# Patient Record
Sex: Male | Born: 1945
Health system: Southern US, Community
[De-identification: ages and names within clinical notes are randomized; demographics above are authoritative.]

## PROBLEM LIST (undated history)

## (undated) DIAGNOSIS — R918 Other nonspecific abnormal finding of lung field: Secondary | ICD-10-CM

## (undated) DIAGNOSIS — J309 Allergic rhinitis, unspecified: Secondary | ICD-10-CM

## (undated) DIAGNOSIS — G4700 Insomnia, unspecified: Secondary | ICD-10-CM

## (undated) DIAGNOSIS — I10 Essential (primary) hypertension: Secondary | ICD-10-CM

## (undated) DIAGNOSIS — Z9889 Other specified postprocedural states: Secondary | ICD-10-CM

## (undated) DIAGNOSIS — H409 Unspecified glaucoma: Secondary | ICD-10-CM

## (undated) DIAGNOSIS — M1990 Unspecified osteoarthritis, unspecified site: Secondary | ICD-10-CM

## (undated) DIAGNOSIS — K759 Inflammatory liver disease, unspecified: Secondary | ICD-10-CM

## (undated) DIAGNOSIS — E785 Hyperlipidemia, unspecified: Secondary | ICD-10-CM

## (undated) DIAGNOSIS — M5136 Other intervertebral disc degeneration, lumbar region: Secondary | ICD-10-CM

## (undated) DIAGNOSIS — R7611 Nonspecific reaction to tuberculin skin test without active tuberculosis: Secondary | ICD-10-CM

## (undated) DIAGNOSIS — N400 Enlarged prostate without lower urinary tract symptoms: Secondary | ICD-10-CM

## (undated) DIAGNOSIS — M51369 Other intervertebral disc degeneration, lumbar region without mention of lumbar back pain or lower extremity pain: Secondary | ICD-10-CM

## (undated) HISTORY — PX: LITHOTRIPSY: SUR834

## (undated) HISTORY — PX: OTHER SURGICAL HISTORY: SHX169

## (undated) HISTORY — DX: Benign prostatic hyperplasia without lower urinary tract symptoms: N40.0

## (undated) HISTORY — DX: Hyperlipidemia, unspecified: E78.5

## (undated) HISTORY — DX: Unspecified glaucoma: H40.9

## (undated) HISTORY — PX: EYE SURGERY: SHX253

## (undated) HISTORY — PX: TOTAL HIP ARTHROPLASTY: SHX124

## (undated) HISTORY — PX: TONSILLECTOMY: SUR1361

---

## 1999-07-02 ENCOUNTER — Ambulatory Visit (HOSPITAL_COMMUNITY): Admission: RE | Admit: 1999-07-02 | Discharge: 1999-07-02 | Payer: Self-pay | Admitting: Gastroenterology

## 1999-07-02 ENCOUNTER — Encounter (INDEPENDENT_AMBULATORY_CARE_PROVIDER_SITE_OTHER): Payer: Self-pay

## 2004-05-13 ENCOUNTER — Ambulatory Visit: Payer: Self-pay | Admitting: Internal Medicine

## 2004-07-12 ENCOUNTER — Ambulatory Visit (HOSPITAL_COMMUNITY): Admission: RE | Admit: 2004-07-12 | Discharge: 2004-07-12 | Payer: Self-pay | Admitting: Internal Medicine

## 2004-08-16 ENCOUNTER — Ambulatory Visit (HOSPITAL_COMMUNITY): Admission: RE | Admit: 2004-08-16 | Discharge: 2004-08-16 | Payer: Self-pay | Admitting: Internal Medicine

## 2004-08-18 ENCOUNTER — Ambulatory Visit (HOSPITAL_COMMUNITY): Admission: RE | Admit: 2004-08-18 | Discharge: 2004-08-18 | Payer: Self-pay | Admitting: Internal Medicine

## 2004-08-21 ENCOUNTER — Ambulatory Visit (HOSPITAL_COMMUNITY): Admission: RE | Admit: 2004-08-21 | Discharge: 2004-08-21 | Payer: Self-pay | Admitting: Urology

## 2005-03-24 ENCOUNTER — Ambulatory Visit: Payer: Self-pay | Admitting: Internal Medicine

## 2006-07-19 ENCOUNTER — Ambulatory Visit: Payer: Self-pay | Admitting: Internal Medicine

## 2006-07-19 ENCOUNTER — Encounter: Payer: Self-pay | Admitting: Internal Medicine

## 2006-07-19 LAB — CONVERTED CEMR LAB
ALT: 21 units/L (ref 0–40)
AST: 23 units/L (ref 0–37)
Albumin: 4.3 g/dL (ref 3.5–5.2)
Alkaline Phosphatase: 60 units/L (ref 39–117)
BUN: 23 mg/dL (ref 6–23)
CO2: 32 meq/L (ref 19–32)
Calcium: 10.1 mg/dL (ref 8.4–10.5)
Chloride: 105 meq/L (ref 96–112)
Chol/HDL Ratio, serum: 3.3
Cholesterol: 170 mg/dL (ref 0–200)
Creatinine, Ser: 1.1 mg/dL (ref 0.4–1.5)
GFR calc non Af Amer: 73 mL/min
Glomerular Filtration Rate, Af Am: 88 mL/min/{1.73_m2}
Glucose, Bld: 103 mg/dL — ABNORMAL HIGH (ref 70–99)
HCV Quantitative: <5 intl units/mL (ref <5)
HDL: 50.9 mg/dL (ref >39.0)
Iron: 165 ug/dL (ref 42–165)
LDL Cholesterol: 101 mg/dL — ABNORMAL HIGH (ref 0–99)
Potassium: 4.6 meq/L (ref 3.5–5.1)
Sodium: 142 meq/L (ref 135–145)
Total Bilirubin: 1.7 mg/dL — ABNORMAL HIGH (ref 0.3–1.2)
Total Protein: 7.1 g/dL (ref 6.0–8.3)
Triglyceride fasting, serum: 90 mg/dL (ref 0–149)
VLDL: 18 mg/dL (ref 0–40)

## 2007-02-12 DIAGNOSIS — Z860100 Personal history of colon polyps, unspecified: Secondary | ICD-10-CM | POA: Insufficient documentation

## 2007-02-12 DIAGNOSIS — B1710 Acute hepatitis C without hepatic coma: Secondary | ICD-10-CM | POA: Insufficient documentation

## 2007-02-12 DIAGNOSIS — J309 Allergic rhinitis, unspecified: Secondary | ICD-10-CM | POA: Insufficient documentation

## 2007-04-25 ENCOUNTER — Encounter: Admission: RE | Admit: 2007-04-25 | Discharge: 2007-04-25 | Payer: Self-pay | Admitting: Internal Medicine

## 2007-04-26 ENCOUNTER — Ambulatory Visit (HOSPITAL_BASED_OUTPATIENT_CLINIC_OR_DEPARTMENT_OTHER): Admission: RE | Admit: 2007-04-26 | Discharge: 2007-04-26 | Payer: Self-pay | Admitting: Urology

## 2007-05-05 ENCOUNTER — Ambulatory Visit: Payer: Self-pay | Admitting: Internal Medicine

## 2007-05-05 DIAGNOSIS — Z87442 Personal history of urinary calculi: Secondary | ICD-10-CM | POA: Insufficient documentation

## 2007-05-18 ENCOUNTER — Ambulatory Visit (HOSPITAL_COMMUNITY): Admission: RE | Admit: 2007-05-18 | Discharge: 2007-05-18 | Payer: Self-pay | Admitting: Urology

## 2009-05-27 ENCOUNTER — Telehealth: Payer: Self-pay | Admitting: Internal Medicine

## 2009-06-13 ENCOUNTER — Ambulatory Visit: Payer: Self-pay | Admitting: Internal Medicine

## 2009-06-13 ENCOUNTER — Telehealth: Payer: Self-pay | Admitting: Internal Medicine

## 2009-08-20 ENCOUNTER — Encounter: Admission: RE | Admit: 2009-08-20 | Discharge: 2009-08-20 | Payer: Self-pay | Admitting: Urology

## 2009-09-04 ENCOUNTER — Ambulatory Visit: Payer: Self-pay | Admitting: Internal Medicine

## 2009-09-04 ENCOUNTER — Encounter: Payer: Self-pay | Admitting: Internal Medicine

## 2009-09-23 ENCOUNTER — Encounter: Admission: RE | Admit: 2009-09-23 | Discharge: 2009-09-23 | Payer: Self-pay | Admitting: Urology

## 2009-09-23 ENCOUNTER — Encounter: Admission: RE | Admit: 2009-09-23 | Discharge: 2009-09-23 | Payer: Self-pay | Admitting: Internal Medicine

## 2009-10-03 ENCOUNTER — Encounter: Payer: Self-pay | Admitting: Internal Medicine

## 2009-10-22 ENCOUNTER — Encounter: Payer: Self-pay | Admitting: Internal Medicine

## 2009-10-25 ENCOUNTER — Ambulatory Visit (HOSPITAL_BASED_OUTPATIENT_CLINIC_OR_DEPARTMENT_OTHER): Admission: RE | Admit: 2009-10-25 | Discharge: 2009-10-25 | Payer: Self-pay | Admitting: Urology

## 2009-12-26 ENCOUNTER — Encounter: Payer: Self-pay | Admitting: Internal Medicine

## 2010-01-22 ENCOUNTER — Ambulatory Visit: Payer: Self-pay | Admitting: Family Medicine

## 2010-01-22 DIAGNOSIS — M545 Low back pain: Secondary | ICD-10-CM

## 2010-01-22 DIAGNOSIS — M25559 Pain in unspecified hip: Secondary | ICD-10-CM | POA: Insufficient documentation

## 2010-05-30 ENCOUNTER — Telehealth: Payer: Self-pay | Admitting: Internal Medicine

## 2010-08-14 NOTE — Progress Notes (Signed)
Summary: REQ FOR RX's  Phone Note Call from Patient   Summary of Call: Pt c/o tracheal irritation & cough. He is req rx's for prednisone, tess perles & prometh/cod. Ok per Dr Posey Rea. Initial call taken by: Lamar Sprinkles, CMA,  May 30, 2010 9:26 AM  Follow-up for Phone Call        ok Follow-up by: Tresa Garter MD,  May 30, 2010 1:19 PM    New/Updated Medications: PREDNISONE 10 MG TABS (PREDNISONE) 2 tabs once daily x 2 days then 1 once daily x 6 days TESSALON PERLES 100 MG CAPS (BENZONATATE) 1 every 6 hours as needed cough PROMETHAZINE-CODEINE 6.25-10 MG/5ML SYRP (PROMETHAZINE-CODEINE) 1 tsp every 6 hours as needed cough Prescriptions: PROMETHAZINE-CODEINE 6.25-10 MG/5ML SYRP (PROMETHAZINE-CODEINE) 1 tsp every 6 hours as needed cough  #8 x 0   Entered by:   Lamar Sprinkles, CMA   Authorized by:   Tresa Garter MD   Signed by:   Lamar Sprinkles, CMA on 05/30/2010   Method used:   Telephoned to ...       [facility]* (retail)       [address]       [address] Shipping/mailing       [place], [place]  [address]       Ph: [phone]       Fax: [phone]   RxID:   [id number] TESSALON PERLES 100 MG CAPS (BENZONATATE) 1 every 6 hours as needed cough  #30 x 0   Entered by:   Lamar Sprinkles, CMA   Authorized by:   Tresa Garter MD   Signed by:   Lamar Sprinkles, CMA on 05/30/2010   Method used:   Electronically to        [facility]* (retail)       [address]       [address] Shipping/mailing       [place], [place]  [address]       Ph: [phone]       Fax: [phone]   RxID:   [id number] PREDNISONE 10 MG TABS (PREDNISONE) 2 tabs once daily x 2 days then 1 once daily x 6 days  #10 x 0   Entered by:   Lamar Sprinkles, CMA   Authorized by:   Tresa Garter MD   Signed by:   Lamar Sprinkles, CMA on 05/30/2010   Method used:   Electronically to        [facility]* (retail)      [address]       [address] Shipping/mailing       [place], [place]  [address]       Ph: [phone]       Fax: [phone]   RxID:   [id number]

## 2010-08-14 NOTE — Assessment & Plan Note (Signed)
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  is been an ongoing problem with them for some years, however this point, is having worsening right-sided pain, a stabbing pain he describes as groin pain, and also is having some lateral and posterior buttocks pain. He is not having any radiculopathy. He is not having any numbness or tingling. No bowel or bladder incontinence. No anesthesia.  Is actually quite active, it is doing a lumbar spine rehabilitation protocol the he reviewed and show me here in the office today. Actually very good protocol, and he does this religiously every morning.  x-rays, right hip, in detail and reviewed, there is a moderate degree of joint space loss, and notable osteoarthritic changes.  X-ray Musculoskeletal  Procedure date:  01/28/2010  Findings:      DG LUMBAR SPINE COMPLETE - [id number]   Clinical Data: History of back pain.  No history of trauma.   LUMBAR SPINE - COMPLETE 4+ VIEW   Comparison: 10/25/2009.  CT 08/20/2009.   Findings: There is moderate fecal distention of portions of the colon.  SI joints appear intact.  There is narrowing of intervertebral disc space at the L5-S1 level.  There is marginal osteophyte formation representing degenerative spondylosis.  No fracture or bony destruction is seen.  No pars defects were evident.   IMPRESSION: No fracture or subluxation is evident.  Changes of degenerative disc disease and degenerative spondylosis are described above.   Read By:  Crawford Givens,  M.D.      Released By:  Crawford Givens,  M.D.  Allergies (verified): No Known Drug Allergies  Past History:  Past medical, surgical, family and social histories (including risk factors) reviewed, and no changes noted (except as noted below).  Past Medical History: Reviewed history from 05/05/2007 and no changes required. UCD Allergic rhinitis Colonic polyps, hx of Hepatitis C Nephrolithiasis, hx of: 2/06, 10/08  Past Surgical History: Reviewed history from 05/05/2007 and no changes required. Tonsillectomy Vasectomy lasik surgery [facility] '06 Ureteroscopy with homian laser lithotripsy  Family History: Reviewed history and no changes required.  Social History: Reviewed history and no changes required.  Review of Systems       REVIEW OF SYSTEMS  GEN: No systemic complaints, no fevers, chills, sweats, or other acute illnesses MSK: Detailed in the HPI GI: tolerating PO intake without difficulty Neuro: No numbness, parasthesias, or tingling associated. Otherwise, the pertinent positives and negatives are listed above and in the HPI, otherwise a full review of systems has been reviewed and is negative unless noted positive.   Physical Exam  General:  Well-developed,well-nourished,in no acute distress; alert,appropriate and cooperative throughout examination Head:  normocephalic and atraumatic.   Ears:  no external deformities.   Lungs:  normal respiratory effort.   Msk:  HIP EXAM: SIDE: R ROM: Abduction, Flexion, Internal and Ext ernal range of motion: patient is have some loss, proximally 25% of abduction, internal and external rotation Pain with terminal IROM and EROM: yes positive C sign GTB: NT SLR: NEG Knees: No effusion FABER:  NT REVERSE FABER: notably positive Piriformis: markedly tender at palpation Str: flexion: 5/5 abduction: 5/5 adduction: 5/5 Strength testing non-tender  Psych:  Cognition and judgment appear intact. Alert and cooperative with normal attention span  and concentration. No apparent delusions, illusions, hallucinations Additional Exam:  L4-S1 is intact in terms of motor strength and sensation. The tendon reflexes are 2+ Intact for pinprick and light touch.  Neurovascularly intact  anterior hip also has notable pain just inferior to the ASIS, there is pain with sartorius isolation and muscle testing.   Impression & Recommendations:  Problem # 1:  HIP PAIN, RIGHT (ICD-719.45) I suspected this is multifactorial. Certainly there is a component of intra-articular hip osteoarthritis. there is also focal muscular pain anteriorly.  Was also a great deal of focal performed spasm, which could explain the buttocks muscle pain. Tosh like to proceed conservatively, and I think this is very reasonable. He is going to f/u with Denice Paradise for PT, and focus on deep muscle massage, PT.   If he has continued symptoms, I think that he would be helpful from a diagnostic and therapeutic standpoint to do a intra-articular hip injection. These are not done blindly, and I generally refer these to interventional radiology.  cc: Dr. Johnella Moloney, MD  Problem # 2:  BACK PAIN, LUMBAR (ICD-724.2)  Orders: T-Lumbar Spine Complete, 5 Views (71110TC)  Complete Medication List: 1)  Zaleplon 10 Mg Caps (Zaleplon) .... At bedtime as needed 2)  Diovan 80 Mg Tabs (Valsartan) .... Take one tablet daily  Patient Instructions: 1)  1. Work on pretzel stretching, shoulder back and leg draped in front. 3-5 sets, 30 sec.. 2)  2. hip abductor rotations. standing, hip flexion and rotation outward then inward. 3 sets, 15 reps. when can do comfortably, add ankle weights starting at 2 pounds.  3)  3. [place] style stretch - knee to chest 4)  4."sink stretch" - cross leg and lean back THIS YOU CAN DO AT WORK 5)  5. Hip rotations while sitting, 3 x 15  Current Allergies (reviewed today): No known allergies

## 2010-08-14 NOTE — Miscellaneous (Signed)
Summary: Orders Update  Clinical Lists Changes  Orders: Added new Test order of T-1 View Abdomen (KUB) (74000TC) - Signed 

## 2010-08-14 NOTE — Medication Information (Signed)
Summary: [facility]  [facility]   Imported By: Lester [place] 10/25/2009 12:00:06  _____________________________________________________________________  External Attachment:    Type:   Image     Comment:   External Document

## 2010-08-14 NOTE — Procedures (Signed)
Summary: Sharrell Ku MD  Sharrell Ku MD   Imported By: Lester [place] 11/06/2009 10:16:32  _____________________________________________________________________  External Attachment:    Type:   Image     Comment:   External Document

## 2010-09-30 LAB — POCT I-STAT, CHEM 8
BUN: 32 mg/dL — ABNORMAL HIGH (ref 6–23)
Calcium, Ion: 1.33 mmol/L — ABNORMAL HIGH (ref 1.12–1.32)
Chloride: 106 mEq/L (ref 96–112)
Creatinine, Ser: 1.8 mg/dL — ABNORMAL HIGH (ref 0.4–1.5)
Glucose, Bld: 91 mg/dL (ref 70–99)
HCT: 41 % (ref 39.0–52.0)
Hemoglobin: 13.9 g/dL (ref 13.0–17.0)
Potassium: 4.3 mEq/L (ref 3.5–5.1)
Sodium: 137 mEq/L (ref 135–145)
TCO2: 26 mmol/L (ref 0–100)

## 2010-11-25 NOTE — Op Note (Signed)
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   PROCEDURES:  Cystoscopy, retrograde ureteroscopy and double-J stent  placement.   SURGEON:  Boston Service, M.D.   ASSISTANT:  None.   ANESTHESIA:  General.   FINDINGS:  Small, less than 1 mm, fragments in the region of the left  ureteral orifice.   SPECIMENS:  None.   COMPLICATIONS:  None obvious.   DESCRIPTION OF PROCEDURE:  The patient was prepped and draped in the  dorsal lithotomy position after institution of an adequate level of  general anesthesia.  Urethra showed no obstruction.  Prostatic urethra  was also nonobstructive.  Bullous edema around the left ureteral  orifice.  Normal urothelium around the right orifice.   Blocking catheter was selected, positioned at the right and then the  left ureteral orifice.  On the right side, there was no evidence of  filling defect or obstruction.  Prompt drainage at 3-5 minutes.  On the  left side, there appeared to be small filling defects within the left  distal ureter.  There were also calcific densities adjacent to the  lateral wall of the bladder, apparently fluid bolus in the left  hemipelvis.  After injection of contrast a floppy-tip guidewire was  inserted, passed into what appeared to be a bifid renal pelvis on the  left.  Ureteroscope was inserted.  Small, less than 1 mm, fragments  were  identified within the left distal ureter and washed out using the  ureteroscope.  No large fragments were identified.  Scope was passed to  the limit of the short 6-French scope and then gently withdrawn.  A 6-  French  24 cm double-J stent with a string was then passed over the indwelling  guidewire with excellent pigtail formation on guidewire removal.  The  patient was given a B&O suppository, 30 mg of Toradol and was returned  to recovery in satisfactory condition.           ______________________________  Boston Service, M.D.     RH/MEDQ  D:  05/18/2007  T:  05/18/2007  Job:  [id number]   cc:   Candyce Churn, M.D.  Fax: [phone]

## 2010-11-25 NOTE — Op Note (Signed)
NAMECHARLETON, Brandon Watts              ACCOUNT NO.:  [id number]   MEDICAL RECORD NO.:  [id number]          PATIENT TYPE:  AMB   LOCATION:  [facility]                         FACILITY:  [facility]   PHYSICIAN:  Boston Service, M.D.DATE OF BIRTH:  [birth year]   DATE OF PROCEDURE:  04/26/2007  DATE OF DISCHARGE:                               OPERATIVE REPORT   PREOPERATIVE DIAGNOSIS:  6 mm calculus left distal ureter.   POSTOPERATIVE DIAGNOSIS:  6 mm calculus left distal ureter.   PROCEDURE:  Cystoscopy, retrograde ureteroscopy and holmium laser  fragmentation of stone.   SURGEON:  Boston Service, M.D.   ASSISTANT:  None.   ANESTHESIA:  General.   FINDINGS:  Impacted 6 mm calculus.   SPECIMENS:  Multitude of stone fragments.   DISPOSITION OF SPECIMENS:  To the patient.   ESTIMATED BLOOD LOSS:  Minimal.   COMPLICATIONS:  None obvious.   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  The patient did have  history of stone about two or three years ago and symptoms over the last  week or so have been more consistent with stone.  Prompted a CT scan  10/13/ 2008 which showed 6 mm calculus left distal ureter.  Discussed  with the patient options for therapy, agreed on ureteroscopy and holmium  laser fragmentation after discussing other reasonable choices for  therapy including the risks and benefits and the alternatives.   DESCRIPTION:  The patient was prepped and draped in the dorsal lithotomy  position after institution of adequate level of general anesthesia.  Well lubricated 21-French panendoscope was gently inserted at the  urethral meatus, normal urethra and sphincter, mild elevation of the  median lobe of the prostate.  Trigonal anatomy was relatively normal.  Clear reflux right orifice, minimal reflux left orifice.  Bladder was  inspected with  a 12 and 70 degrees lenses and showed no other obvious  evidence of intravesical pathology.   Blocking catheter was selected at the right ureteral orifice then at the  left ureteral orifice on the right side.  Normal course and caliber of  the ureter, pelvis and calyces with prompt drainage at about three to  five minutes.  On the left side the patient had a thin tight distal  ureter with a 6 mm filling defect proximally and marked hydronephrosis.  A glide wire was selected, passed at the left ureteral orifice,  negotiated beyond the filling defect and passed into what appeared to be  dilated upper pole calyces, the 6-French end-hole catheter was then  passed over the glide wire with prompt postobstructive diuresis through  the stent.  The glide wire was removed and replaced with a guidewire end-  hole catheter was withdrawn and the short 6-French ureteroscope was  inserted alongside the guidewire.  Stone was localized within the distal  ureter, 365 holmium fiber 0.5 joules was selected and fragmentation  commenced.  Stone appeared to fragment easily and after about 10 or 15  minutes all  fragments were 1 mm or less.  Ureteroscope was then  withdrawn.  There was prompt reflux of small stony fragments and  concentrated urine from the left ureteral orifice.  No stent was left  indwelling.  Bladder was drained.  Cystoscope was removed.  The patient  was given a B&O suppository and returned to recovery in satisfactory  condition.           ______________________________  Boston Service, M.D.     RH/MEDQ  D:  04/26/2007  T:  04/27/2007  Job:  [id number]   cc:   Candyce Churn, M.D.  Fax: [phone]   Rosalyn Gess. Leng, MD  [address]  [place]  [place] [address]

## 2011-04-14 ENCOUNTER — Telehealth: Payer: Self-pay | Admitting: *Deleted

## 2011-04-14 MED ORDER — CEPHALEXIN 500 MG PO CAPS: 500.0000 mg | ORAL_CAPSULE | Freq: Four times a day (QID) | ORAL | Status: AC

## 2011-04-14 NOTE — Telephone Encounter (Signed)
Needs ABX for infection in finger, Ok per Dr Posey Rea. Rx sent in, Patient informed.   Dr Macario Golds, please sign off on phone message, thank you

## 2011-04-14 NOTE — Telephone Encounter (Signed)
L thumb paronychia I&D: 0.5 cc pus. Wound dresssed. Keflex, soaks. Thx

## 2011-04-23 LAB — POCT HEMOGLOBIN-HEMACUE
Hemoglobin: 15
Operator id: 268271

## 2011-09-19 ENCOUNTER — Other Ambulatory Visit: Payer: Self-pay | Admitting: Family Medicine

## 2011-09-19 MED ORDER — FLUTICASONE PROPIONATE 50 MCG/ACT NA SUSP: 2.0000 | Freq: Every day | NASAL | Status: DC

## 2012-01-19 ENCOUNTER — Other Ambulatory Visit: Payer: Self-pay | Admitting: Internal Medicine

## 2012-01-19 ENCOUNTER — Other Ambulatory Visit (HOSPITAL_COMMUNITY): Payer: Self-pay | Admitting: Internal Medicine

## 2012-01-19 DIAGNOSIS — Z87891 Personal history of nicotine dependence: Secondary | ICD-10-CM

## 2012-01-22 ENCOUNTER — Ambulatory Visit (HOSPITAL_COMMUNITY)
Admission: RE | Admit: 2012-01-22 | Discharge: 2012-01-22 | Disposition: A | Discharge status: Home / Self Care | Payer: 59 | Source: Ambulatory Visit | Attending: Internal Medicine | Admitting: Internal Medicine

## 2012-01-22 ENCOUNTER — Ambulatory Visit
Admission: RE | Admit: 2012-01-22 | Discharge: 2012-01-22 | Disposition: A | Discharge status: Home / Self Care | Payer: No Typology Code available for payment source | Source: Ambulatory Visit | Attending: Internal Medicine | Admitting: Internal Medicine

## 2012-01-22 DIAGNOSIS — Z87891 Personal history of nicotine dependence: Secondary | ICD-10-CM

## 2012-01-22 DIAGNOSIS — R0609 Other forms of dyspnea: Secondary | ICD-10-CM | POA: Insufficient documentation

## 2012-01-22 DIAGNOSIS — R0989 Other specified symptoms and signs involving the circulatory and respiratory systems: Secondary | ICD-10-CM | POA: Insufficient documentation

## 2012-01-22 MED ORDER — ALBUTEROL SULFATE (5 MG/ML) 0.5% IN NEBU
2.5000 mg | INHALATION_SOLUTION | Freq: Once | RESPIRATORY_TRACT | Status: AC
  Administered 2012-01-22: 2.5 mg via RESPIRATORY_TRACT

## 2012-09-23 ENCOUNTER — Other Ambulatory Visit: Payer: Self-pay | Admitting: Internal Medicine

## 2012-09-23 ENCOUNTER — Ambulatory Visit
Admission: RE | Admit: 2012-09-23 | Discharge: 2012-09-23 | Disposition: A | Discharge status: Home / Self Care | Payer: 59 | Source: Ambulatory Visit | Attending: Internal Medicine | Admitting: Internal Medicine

## 2012-09-23 DIAGNOSIS — M25551 Pain in right hip: Secondary | ICD-10-CM

## 2012-09-23 DIAGNOSIS — M25552 Pain in left hip: Secondary | ICD-10-CM

## 2012-10-03 ENCOUNTER — Encounter (HOSPITAL_COMMUNITY): Payer: Self-pay | Admitting: Pharmacy Technician

## 2012-10-20 ENCOUNTER — Other Ambulatory Visit: Payer: Self-pay | Admitting: Internal Medicine

## 2012-10-20 ENCOUNTER — Encounter (HOSPITAL_COMMUNITY): Payer: Self-pay

## 2012-10-20 DIAGNOSIS — J309 Allergic rhinitis, unspecified: Secondary | ICD-10-CM

## 2012-10-20 DIAGNOSIS — M545 Low back pain: Secondary | ICD-10-CM

## 2012-10-20 MED ORDER — FLUTICASONE PROPIONATE 50 MCG/ACT NA SUSP: 2.0000 | Freq: Every day | NASAL | Status: DC

## 2012-10-20 MED ORDER — CELECOXIB 200 MG PO CAPS: 200.0000 mg | ORAL_CAPSULE | Freq: Every day | ORAL | Status: DC

## 2012-10-20 NOTE — Pre-Procedure Instructions (Signed)
OFFICE NOTE 10/07/12 AND MEDICAL CLEARANCE FOR SURGERY ON CHART FROM DR. ROBERT N. GATES.

## 2012-10-21 ENCOUNTER — Encounter (HOSPITAL_COMMUNITY): Payer: Self-pay

## 2012-10-21 ENCOUNTER — Encounter (HOSPITAL_COMMUNITY)
Admission: RE | Admit: 2012-10-21 | Discharge: 2012-10-21 | Disposition: A | Discharge status: Home / Self Care | Payer: 59 | Source: Ambulatory Visit | Attending: Orthopedic Surgery | Admitting: Orthopedic Surgery

## 2012-10-21 ENCOUNTER — Inpatient Hospital Stay (HOSPITAL_COMMUNITY): Admission: RE | Admit: 2012-10-21 | Payer: 59 | Source: Ambulatory Visit

## 2012-10-21 HISTORY — DX: Essential (primary) hypertension: I10

## 2012-10-21 HISTORY — DX: Inflammatory liver disease, unspecified: K75.9

## 2012-10-21 HISTORY — DX: Other intervertebral disc degeneration, lumbar region: M51.36

## 2012-10-21 HISTORY — DX: Allergic rhinitis, unspecified: J30.9

## 2012-10-21 HISTORY — DX: Unspecified osteoarthritis, unspecified site: M19.90

## 2012-10-21 HISTORY — DX: Other intervertebral disc degeneration, lumbar region without mention of lumbar back pain or lower extremity pain: M51.369

## 2012-10-21 HISTORY — DX: Insomnia, unspecified: G47.00

## 2012-10-21 HISTORY — DX: Other nonspecific abnormal finding of lung field: R91.8

## 2012-10-21 HISTORY — DX: Nonspecific reaction to tuberculin skin test without active tuberculosis: R76.11

## 2012-10-21 LAB — APTT: aPTT: 29 seconds (ref 24–37)

## 2012-10-21 LAB — CBC
Hemoglobin: 15.5 g/dL (ref 13.0–17.0)
RBC: 4.67 MIL/uL (ref 4.22–5.81)
WBC: 7.4 10*3/uL (ref 4.0–10.5)

## 2012-10-21 LAB — URINALYSIS, ROUTINE W REFLEX MICROSCOPIC
Bilirubin Urine: NEGATIVE
Hgb urine dipstick: NEGATIVE
Ketones, ur: NEGATIVE mg/dL
Nitrite: NEGATIVE
Urobilinogen, UA: 1 mg/dL (ref 0.0–1.0)

## 2012-10-21 LAB — LIPID PANEL
HDL: 58 mg/dL (ref >39)
LDL Cholesterol: 92 mg/dL (ref 0–99)
VLDL: 21 mg/dL (ref 0–40)

## 2012-10-21 LAB — BASIC METABOLIC PANEL
GFR calc Af Amer: >90 mL/min (ref >90)
GFR calc non Af Amer: 87 mL/min — ABNORMAL LOW (ref >90)
Potassium: 4.5 mEq/L (ref 3.5–5.1)
Sodium: 140 mEq/L (ref 135–145)

## 2012-10-21 LAB — PROTIME-INR
INR: 1.03 (ref 0.00–1.49)
Prothrombin Time: 13.4 seconds (ref 11.6–15.2)

## 2012-10-21 LAB — SURGICAL PCR SCREEN
MRSA, PCR: NEGATIVE
Staphylococcus aureus: NEGATIVE

## 2012-10-21 LAB — ABO/RH: ABO/RH(D): B POS

## 2012-10-21 NOTE — Pre-Procedure Instructions (Addendum)
EKG WAS DONE TODAY - PREOP AT [facility]. 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 R. N. GATES.   ALL LABS AND EKG WERE FAXED AS REQUESTED TO DR. Kevan Ny' OFFICE.

## 2012-10-21 NOTE — H&P (Signed)
TOTAL HIP ADMISSION H&P  Patient is admitted for right total hip arthroplasty, anterior approach.  Subjective:  Chief Complaint: Right hip OA / pain  HPI: Brandon Watts, 67 y.o. male, has a history of pain and functional disability in the right hip(s) due to arthritis and patient has failed non-surgical conservative treatments for greater than 12 weeks to include NSAID's and/or analgesics, supervised PT with diminished ADL's post treatment and activity modification.  Onset of symptoms was gradual starting 3 years ago with gradually worsening course since that time.The patient noted no past surgery on the right hip(s).  Patient currently rates pain in the right hip at 8 out of 10 with activity. Patient has night pain, worsening of pain with activity and weight bearing, pain that interfers with activities of daily living and pain with passive range of motion. Patient has evidence of periarticular osteophytes and joint space narrowing by imaging studies. This condition presents safety issues increasing the risk of falls.  There is no current active infection.  Risks, benefits and expectations were discussed with the patient. Patient understand the risks, benefits and expectations and wishes to proceed with surgery.   D/C Plans:   Home with HHPT  Post-op Meds:   Rx given for ASA, Robaxin, Celebrex, Iron, Colace and MiraLax  Tranexamic Acid:   To be given  Decadron:    To be given  FYI:    Would like to use tramadol instead of Norco   Patient Active Problem List   Diagnosis Date Noted  . HIP PAIN, RIGHT 01/22/2010  . BACK PAIN, LUMBAR 01/22/2010  . NONSPEC REACT TUBERCULIN SKN TEST W/O ACTV TB 06/13/2009  . NEPHROLITHIASIS, HX OF 05/05/2007  . HEPATITIS C 02/12/2007  . ALLERGIC RHINITIS 02/12/2007  . COLONIC POLYPS, HX OF 02/12/2007   Past Medical History  Diagnosis Date  . History of kidney stones   . Allergic rhinitis   . Hypertension   . Pulmonary nodules   . Hepatitis - CLEARED    . Arthritis - PAIN AND OA RIGHT HIP   . DDD (degenerative disc disease), lumbar     Past Surgical History  Procedure Laterality Date  . Tonsillectomy      AS A CHILD  . Vascectomy    . Ureteral stone extraction s x 2    . Lithotripsy    . Eye surgery      LASIK EYE SURGERY     No Known Allergies    History  Substance Use Topics  . Smoking status: Former Smoker -- 1.00 packs/day for 30 years    Types: Cigarettes  . Smokeless tobacco: Not on file     Comment: QUIT SMOKING 1981  . Alcohol Use: Yes     Comment: 1&1/2 OZ DAILY       Review of Systems  Constitutional: Negative.   HENT: Negative.   Eyes: Negative.   Respiratory: Negative.   Cardiovascular: Negative.   Gastrointestinal: Negative.   Musculoskeletal: Positive for joint pain.  Skin: Negative.   Neurological: Negative.   Endo/Heme/Allergies: Positive for environmental allergies.  Psychiatric/Behavioral: Negative.     Objective:  Physical Exam  Constitutional: He is oriented to person, place, and time. He appears well-developed and well-nourished.  HENT:  Head: Normocephalic and atraumatic.  Mouth/Throat: Oropharynx is clear and moist.  Eyes: Pupils are equal, round, and reactive to light.  Neck: Neck supple. No JVD present. No tracheal deviation present. No thyromegaly present.  Cardiovascular: Normal rate, regular rhythm and intact distal  pulses.  Exam reveals gallop.   Respiratory: Breath sounds normal. No stridor. No respiratory distress. He has no wheezes.  GI: Soft. There is no tenderness. There is no guarding.  Musculoskeletal:       Right hip: He exhibits decreased range of motion, decreased strength, tenderness and bony tenderness. He exhibits no swelling, no deformity and no laceration.  Lymphadenopathy:    He has no cervical adenopathy.  Neurological: He is alert and oriented to person, place, and time.  Skin: Skin is warm and dry.  Psychiatric: He has a normal mood and affect.    Vital  signs in last 24 hours: Temp:  [98 F (36.7 C)] 98 F (36.7 C) (04/11 1130) Pulse Rate:  [64] 64 (04/11 1130) Resp:  [16] 16 (04/11 1130) BP: (158)/(94) 158/94 mmHg (04/11 1130) SpO2:  [98 %] 98 % (04/11 1130) Weight:  [85.548 kg (188 lb 9.6 oz)] 85.548 kg (188 lb 9.6 oz) (04/11 1130)   Imaging Review Plain radiographs demonstrate moderate degenerative joint disease of the right hip(s). The bone quality appears to be good for age and reported activity level.  Assessment/Plan:  End stage arthritis, right hip(s)  The patient history, physical examination, clinical judgement of the provider and imaging studies are consistent with end stage degenerative joint disease of the right hip(s) and total hip arthroplasty is deemed medically necessary. The treatment options including medical management, injection therapy, arthroscopy and arthroplasty were discussed at length. The risks and benefits of total hip arthroplasty were presented and reviewed. The risks due to aseptic loosening, infection, stiffness, dislocation/subluxation,  thromboembolic complications and other imponderables were discussed.  The patient acknowledged the explanation, agreed to proceed with the plan and consent was signed. Patient is being admitted for inpatient treatment for surgery, pain control, PT, OT, prophylactic antibiotics, VTE prophylaxis, progressive ambulation and ADL's and discharge planning.The patient is planning to be discharged home with home health services.    Anastasio Auerbach Michela Herst   PAC  10/21/2012, 3:03 PM

## 2012-10-21 NOTE — Patient Instructions (Addendum)
YOUR SURGERY IS SCHEDULED AT [facility]  ON:     Tuesday  4/15  REPORT TO [facility] AT:  5:00 AM      PHONE # FOR SHORT STAY IS [phone]  DO NOT EAT OR DRINK ANYTHING AFTER MIDNIGHT THE NIGHT BEFORE YOUR SURGERY.  YOU MAY BRUSH YOUR TEETH, RINSE OUT YOUR MOUTH--BUT NO WATER, NO FOOD, NO CHEWING GUM, NO MINTS, NO CANDIES, NO CHEWING TOBACCO.  PLEASE TAKE THE FOLLOWING MEDICATIONS THE AM OF YOUR SURGERY WITH A FEW SIPS OF WATER:  NO MEDS TO TAKE    DO NOT BRING VALUABLES, MONEY, CREDIT CARDS.  DO NOT WEAR JEWELRY, MAKE-UP, NAIL POLISH AND NO METAL PINS OR CLIPS IN YOUR HAIR. CONTACT LENS, DENTURES / PARTIALS, GLASSES SHOULD NOT BE WORN TO SURGERY AND IN MOST CASES-HEARING AIDS WILL NEED TO BE REMOVED.  BRING YOUR GLASSES CASE, ANY EQUIPMENT NEEDED FOR YOUR CONTACT LENS. FOR PATIENTS ADMITTED TO THE HOSPITAL--CHECK OUT TIME THE DAY OF DISCHARGE IS 11:00 AM.  ALL INPATIENT ROOMS ARE PRIVATE - WITH BATHROOM, TELEPHONE, TELEVISION AND WIFI INTERNET.                                PLEASE READ OVER ANY  FACT SHEETS THAT YOU WERE GIVEN: MRSA INFORMATION, BLOOD TRANSFUSION INFORMATION, INCENTIVE SPIROMETER INFORMATION. FAILURE TO FOLLOW THESE INSTRUCTIONS MAY RESULT IN THE CANCELLATION OF YOUR SURGERY.   PATIENT SIGNATURE_________________________________

## 2012-10-25 ENCOUNTER — Inpatient Hospital Stay (HOSPITAL_COMMUNITY): Payer: 59

## 2012-10-25 ENCOUNTER — Encounter (HOSPITAL_COMMUNITY): Payer: Self-pay | Admitting: Registered Nurse

## 2012-10-25 ENCOUNTER — Inpatient Hospital Stay (HOSPITAL_COMMUNITY)
Admission: RE | Admit: 2012-10-25 | Discharge: 2012-10-26 | DRG: 470 | Disposition: A | Discharge status: Home Health | Payer: 59 | Source: Ambulatory Visit | Attending: Orthopedic Surgery | Admitting: Orthopedic Surgery

## 2012-10-25 ENCOUNTER — Inpatient Hospital Stay (HOSPITAL_COMMUNITY): Payer: 59 | Admitting: Registered Nurse

## 2012-10-25 ENCOUNTER — Encounter (HOSPITAL_COMMUNITY)
Admission: RE | Disposition: A | Discharge status: Home Health | Payer: Self-pay | Source: Ambulatory Visit | Attending: Orthopedic Surgery

## 2012-10-25 DIAGNOSIS — M169 Osteoarthritis of hip, unspecified: Principal | ICD-10-CM | POA: Diagnosis present

## 2012-10-25 DIAGNOSIS — D62 Acute posthemorrhagic anemia: Secondary | ICD-10-CM | POA: Diagnosis not present

## 2012-10-25 DIAGNOSIS — D500 Iron deficiency anemia secondary to blood loss (chronic): Secondary | ICD-10-CM

## 2012-10-25 DIAGNOSIS — I10 Essential (primary) hypertension: Secondary | ICD-10-CM | POA: Diagnosis present

## 2012-10-25 DIAGNOSIS — Z01812 Encounter for preprocedural laboratory examination: Secondary | ICD-10-CM

## 2012-10-25 DIAGNOSIS — E663 Overweight: Secondary | ICD-10-CM | POA: Diagnosis present

## 2012-10-25 DIAGNOSIS — M1610 Unilateral primary osteoarthritis, unspecified hip: Principal | ICD-10-CM | POA: Diagnosis present

## 2012-10-25 DIAGNOSIS — Z96649 Presence of unspecified artificial hip joint: Secondary | ICD-10-CM

## 2012-10-25 HISTORY — PX: TOTAL HIP ARTHROPLASTY: SHX124

## 2012-10-25 LAB — TYPE AND SCREEN: Antibody Screen: NEGATIVE

## 2012-10-25 SURGERY — ARTHROPLASTY, HIP, TOTAL, ANTERIOR APPROACH
Anesthesia: General | Site: Hip | Laterality: Right | Wound class: Clean

## 2012-10-25 MED ORDER — DEXAMETHASONE SODIUM PHOSPHATE 10 MG/ML IJ SOLN
10.0000 mg | Freq: Once | INTRAMUSCULAR | Status: AC
  Administered 2012-10-25: 10 mg via INTRAVENOUS

## 2012-10-25 MED ORDER — ALUM & MAG HYDROXIDE-SIMETH 200-200-20 MG/5ML PO SUSP: 30.0000 mL | ORAL | Status: DC | PRN

## 2012-10-25 MED ORDER — CEFAZOLIN SODIUM-DEXTROSE 2-3 GM-% IV SOLR
2.0000 g | INTRAVENOUS | Status: AC
  Administered 2012-10-25: 2 g via INTRAVENOUS

## 2012-10-25 MED ORDER — SODIUM CHLORIDE 0.9 % IV SOLN
INTRAVENOUS | Status: DC
  Administered 2012-10-25 – 2012-10-26 (×2): via INTRAVENOUS
  Filled 2012-10-25 (×9): qty 1000

## 2012-10-25 MED ORDER — MIDAZOLAM HCL 5 MG/5ML IJ SOLN
INTRAMUSCULAR | Status: DC | PRN
  Administered 2012-10-25: 2 mg via INTRAVENOUS

## 2012-10-25 MED ORDER — ZOLPIDEM TARTRATE 5 MG PO TABS: 5.0000 mg | ORAL_TABLET | Freq: Every evening | ORAL | Status: DC | PRN

## 2012-10-25 MED ORDER — 0.9 % SODIUM CHLORIDE (POUR BTL) OPTIME
TOPICAL | Status: DC | PRN
  Administered 2012-10-25: 1000 mL

## 2012-10-25 MED ORDER — ONDANSETRON HCL 4 MG PO TABS: 4.0000 mg | ORAL_TABLET | Freq: Four times a day (QID) | ORAL | Status: DC | PRN

## 2012-10-25 MED ORDER — DOCUSATE SODIUM 100 MG PO CAPS
100.0000 mg | ORAL_CAPSULE | Freq: Two times a day (BID) | ORAL | Status: DC
  Administered 2012-10-25 – 2012-10-26 (×3): 100 mg via ORAL

## 2012-10-25 MED ORDER — DIPHENHYDRAMINE HCL 12.5 MG/5ML PO ELIX: 25.0000 mg | ORAL_SOLUTION | Freq: Four times a day (QID) | ORAL | Status: DC | PRN

## 2012-10-25 MED ORDER — FLUTICASONE PROPIONATE 50 MCG/ACT NA SUSP
2.0000 | Freq: Every day | NASAL | Status: DC
  Administered 2012-10-26: 2 via NASAL
  Filled 2012-10-25: qty 16

## 2012-10-25 MED ORDER — HYDROMORPHONE HCL PF 1 MG/ML IJ SOLN: 0.2000 mg | INTRAMUSCULAR | Status: DC | PRN

## 2012-10-25 MED ORDER — LOSARTAN POTASSIUM 50 MG PO TABS
50.0000 mg | ORAL_TABLET | Freq: Every day | ORAL | Status: DC
  Administered 2012-10-25: 50 mg via ORAL
  Filled 2012-10-25 (×2): qty 1

## 2012-10-25 MED ORDER — ONDANSETRON HCL 4 MG/2ML IJ SOLN
INTRAMUSCULAR | Status: DC | PRN
  Administered 2012-10-25: 4 mg via INTRAVENOUS

## 2012-10-25 MED ORDER — HYDROMORPHONE HCL PF 1 MG/ML IJ SOLN: 0.2500 mg | INTRAMUSCULAR | Status: DC | PRN

## 2012-10-25 MED ORDER — RIVAROXABAN 10 MG PO TABS
10.0000 mg | ORAL_TABLET | Freq: Every day | ORAL | Status: DC
  Administered 2012-10-26: 10 mg via ORAL
  Filled 2012-10-25 (×2): qty 1

## 2012-10-25 MED ORDER — METHOCARBAMOL 100 MG/ML IJ SOLN: 500.0000 mg | Freq: Four times a day (QID) | INTRAVENOUS | Status: DC | PRN

## 2012-10-25 MED ORDER — PROPOFOL INFUSION 10 MG/ML OPTIME
INTRAVENOUS | Status: DC | PRN
  Administered 2012-10-25: 75 ug/kg/min via INTRAVENOUS

## 2012-10-25 MED ORDER — SENNA 8.6 MG PO TABS
1.0000 | ORAL_TABLET | Freq: Two times a day (BID) | ORAL | Status: DC
  Administered 2012-10-25 – 2012-10-26 (×3): 8.6 mg via ORAL
  Filled 2012-10-25 (×3): qty 1

## 2012-10-25 MED ORDER — PROMETHAZINE HCL 25 MG/ML IJ SOLN: 6.2500 mg | INTRAMUSCULAR | Status: DC | PRN

## 2012-10-25 MED ORDER — POLYETHYLENE GLYCOL 3350 17 G PO PACK: 17.0000 g | PACK | Freq: Every day | ORAL | Status: DC | PRN

## 2012-10-25 MED ORDER — MENTHOL 3 MG MT LOZG
1.0000 | LOZENGE | OROMUCOSAL | Status: DC | PRN
  Filled 2012-10-25: qty 9

## 2012-10-25 MED ORDER — TRANEXAMIC ACID 100 MG/ML IV SOLN
1000.0000 mg | Freq: Once | INTRAVENOUS | Status: AC
  Administered 2012-10-25: 1000 mg via INTRAVENOUS
  Filled 2012-10-25: qty 10

## 2012-10-25 MED ORDER — FERROUS SULFATE 325 (65 FE) MG PO TABS
325.0000 mg | ORAL_TABLET | Freq: Three times a day (TID) | ORAL | Status: DC
  Administered 2012-10-25 – 2012-10-26 (×4): 325 mg via ORAL
  Filled 2012-10-25 (×6): qty 1

## 2012-10-25 MED ORDER — TRAMADOL HCL 50 MG PO TABS
50.0000 mg | ORAL_TABLET | Freq: Four times a day (QID) | ORAL | Status: DC
  Administered 2012-10-25 (×3): 100 mg via ORAL
  Administered 2012-10-26: 50 mg via ORAL
  Administered 2012-10-26: 100 mg via ORAL
  Filled 2012-10-25 (×7): qty 2
  Filled 2012-10-25: qty 1
  Filled 2012-10-25 (×3): qty 2

## 2012-10-25 MED ORDER — CHLORHEXIDINE GLUCONATE 4 % EX LIQD
60.0000 mL | Freq: Once | CUTANEOUS | Status: DC
  Filled 2012-10-25: qty 60

## 2012-10-25 MED ORDER — CELECOXIB 200 MG PO CAPS
200.0000 mg | ORAL_CAPSULE | Freq: Every day | ORAL | Status: DC
  Administered 2012-10-25 – 2012-10-26 (×2): 200 mg via ORAL
  Filled 2012-10-25 (×2): qty 1

## 2012-10-25 MED ORDER — LACTATED RINGERS IV SOLN
INTRAVENOUS | Status: DC | PRN
  Administered 2012-10-25: 06:00:00 via INTRAVENOUS

## 2012-10-25 MED ORDER — DEXAMETHASONE SODIUM PHOSPHATE 10 MG/ML IJ SOLN
10.0000 mg | Freq: Once | INTRAMUSCULAR | Status: AC
  Administered 2012-10-26: 10 mg via INTRAVENOUS
  Filled 2012-10-25: qty 1

## 2012-10-25 MED ORDER — EPHEDRINE SULFATE 50 MG/ML IJ SOLN
INTRAMUSCULAR | Status: DC | PRN
  Administered 2012-10-25 (×4): 5 mg via INTRAVENOUS

## 2012-10-25 MED ORDER — BUPIVACAINE HCL (PF) 0.5 % IJ SOLN
INTRAMUSCULAR | Status: DC | PRN
  Administered 2012-10-25: 15 mg

## 2012-10-25 MED ORDER — ACETAMINOPHEN 10 MG/ML IV SOLN
INTRAVENOUS | Status: DC | PRN
  Administered 2012-10-25: 1000 mg via INTRAVENOUS

## 2012-10-25 MED ORDER — PHENYLEPHRINE HCL 10 MG/ML IJ SOLN
INTRAMUSCULAR | Status: DC | PRN
  Administered 2012-10-25 (×5): 80 ug via INTRAVENOUS

## 2012-10-25 MED ORDER — FENTANYL CITRATE 0.05 MG/ML IJ SOLN
INTRAMUSCULAR | Status: DC | PRN
  Administered 2012-10-25: 50 ug via INTRAVENOUS
  Administered 2012-10-25: 25 ug via INTRAVENOUS

## 2012-10-25 MED ORDER — METHOCARBAMOL 500 MG PO TABS
500.0000 mg | ORAL_TABLET | Freq: Four times a day (QID) | ORAL | Status: DC | PRN
  Administered 2012-10-25 – 2012-10-26 (×3): 500 mg via ORAL
  Filled 2012-10-25 (×3): qty 1

## 2012-10-25 MED ORDER — ONDANSETRON HCL 4 MG/2ML IJ SOLN: 4.0000 mg | Freq: Four times a day (QID) | INTRAMUSCULAR | Status: DC | PRN

## 2012-10-25 MED ORDER — CEFAZOLIN SODIUM-DEXTROSE 2-3 GM-% IV SOLR
2.0000 g | Freq: Four times a day (QID) | INTRAVENOUS | Status: AC
  Administered 2012-10-25 (×2): 2 g via INTRAVENOUS
  Filled 2012-10-25 (×2): qty 50

## 2012-10-25 MED ORDER — STERILE WATER FOR IRRIGATION IR SOLN
Status: DC | PRN
  Administered 2012-10-25: 3000 mL

## 2012-10-25 MED ORDER — PHENOL 1.4 % MT LIQD: 1.0000 | OROMUCOSAL | Status: DC | PRN

## 2012-10-25 SURGICAL SUPPLY — 37 items
BAG ZIPLOCK 12X15 (MISCELLANEOUS) ×4 IMPLANT
BLADE SAW SGTL 18X1.27X75 (BLADE) ×2 IMPLANT
CLOTH BEACON ORANGE TIMEOUT ST (SAFETY) ×2 IMPLANT
DERMABOND ADVANCED (GAUZE/BANDAGES/DRESSINGS) ×1
DERMABOND ADVANCED .7 DNX12 (GAUZE/BANDAGES/DRESSINGS) ×1 IMPLANT
DRAPE C-ARM 42X72 X-RAY (DRAPES) ×2 IMPLANT
DRAPE STERI IOBAN 125X83 (DRAPES) ×2 IMPLANT
DRAPE U-SHAPE 47X51 STRL (DRAPES) ×6 IMPLANT
DRSG AQUACEL AG ADV 3.5X10 (GAUZE/BANDAGES/DRESSINGS) ×2 IMPLANT
DRSG TEGADERM 4X4.75 (GAUZE/BANDAGES/DRESSINGS) ×2 IMPLANT
DURAPREP 26ML APPLICATOR (WOUND CARE) ×2 IMPLANT
ELECT BLADE TIP CTD 4 INCH (ELECTRODE) ×2 IMPLANT
ELECT REM PT RETURN 9FT ADLT (ELECTROSURGICAL) ×2
ELECTRODE REM PT RTRN 9FT ADLT (ELECTROSURGICAL) ×1 IMPLANT
EVACUATOR 1/8 PVC DRAIN (DRAIN) IMPLANT
FACESHIELD LNG OPTICON STERILE (SAFETY) ×8 IMPLANT
GAUZE SPONGE 2X2 8PLY STRL LF (GAUZE/BANDAGES/DRESSINGS) ×1 IMPLANT
GLOVE BIOGEL PI IND STRL 7.5 (GLOVE) ×1 IMPLANT
GLOVE BIOGEL PI IND STRL 8 (GLOVE) ×1 IMPLANT
GLOVE BIOGEL PI INDICATOR 7.5 (GLOVE) ×1
GLOVE BIOGEL PI INDICATOR 8 (GLOVE) ×1
GLOVE ECLIPSE 8.0 STRL XLNG CF (GLOVE) ×2 IMPLANT
GLOVE ORTHO TXT STRL SZ7.5 (GLOVE) ×4 IMPLANT
GOWN BRE IMP PREV XXLGXLNG (GOWN DISPOSABLE) ×2 IMPLANT
GOWN STRL NON-REIN LRG LVL3 (GOWN DISPOSABLE) ×2 IMPLANT
KIT BASIN OR (CUSTOM PROCEDURE TRAY) ×2 IMPLANT
PACK TOTAL JOINT (CUSTOM PROCEDURE TRAY) ×2 IMPLANT
PADDING CAST COTTON 6X4 STRL (CAST SUPPLIES) ×2 IMPLANT
SPONGE GAUZE 2X2 STER 10/PKG (GAUZE/BANDAGES/DRESSINGS) ×1
SUCTION FRAZIER 12FR DISP (SUCTIONS) ×2 IMPLANT
SUT MNCRL AB 4-0 PS2 18 (SUTURE) ×2 IMPLANT
SUT VIC AB 1 CT1 36 (SUTURE) ×8 IMPLANT
SUT VIC AB 2-0 CT1 27 (SUTURE) ×2
SUT VIC AB 2-0 CT1 TAPERPNT 27 (SUTURE) ×2 IMPLANT
SUT VLOC 180 0 24IN GS25 (SUTURE) ×2 IMPLANT
TOWEL OR 17X26 10 PK STRL BLUE (TOWEL DISPOSABLE) ×4 IMPLANT
TRAY FOLEY CATH 14FRSI W/METER (CATHETERS) ×2 IMPLANT

## 2012-10-25 NOTE — Evaluation (Signed)
Physical Therapy Evaluation Patient Details Name: Brandon Watts MRN: [id number] DOB: December 25, 1945 Today's Date: 10/25/2012 Time: 4098-1191 PT Time Calculation (min): 30 min  PT Assessment / Plan / Recommendation Clinical Impression  Pt s/p R THR presents with decreased R LE strength/ROM and post op pain/spasm limiting functional mobility    PT Assessment  Patient needs continued PT services    Follow Up Recommendations  Home health PT    Does the patient have the potential to tolerate intense rehabilitation      Barriers to Discharge None      Equipment Recommendations  None recommended by PT    Recommendations for Other Services OT consult   Frequency 7X/week    Precautions / Restrictions Precautions Precautions: Fall Restrictions Weight Bearing Restrictions: No Other Position/Activity Restrictions: WBAT   Pertinent Vitals/Pain 5/10; premed, ice packs provided, Muscle relaxer requested      Mobility  Bed Mobility Bed Mobility: Supine to Sit Supine to Sit: 4: Min assist Details for Bed Mobility Assistance: cues for sequence and use of L LE to self assist Transfers Transfers: Sit to Stand;Stand to Sit Sit to Stand: 4: Min assist Stand to Sit: 4: Min assist Details for Transfer Assistance: cues for LE management and use of UEs to self assist Ambulation/Gait Ambulation/Gait Assistance: 4: Min assist Ambulation Distance (Feet): 49 Feet Assistive device: Rolling walker Ambulation/Gait Assistance Details: cues for sequence and position from RW Gait Pattern: Step-through pattern;Step-to pattern;Decreased step length - right;Decreased step length - left General Gait Details: ltd by c/o muscle spasms in gluts Stairs: No    Exercises Total Joint Exercises Ankle Circles/Pumps: AROM;15 reps;Supine;Both Quad Sets: AROM;10 reps;Supine;Both Heel Slides: AAROM;10 reps;Supine;Right Hip ABduction/ADduction: AAROM;10 reps;Supine;Right   PT Diagnosis: Difficulty walking   PT Problem List: Decreased strength;Decreased range of motion;Decreased activity tolerance;Decreased mobility;Pain;Decreased knowledge of use of DME PT Treatment Interventions: DME instruction;Gait training;Stair training;Functional mobility training;Therapeutic activities;Therapeutic exercise;Patient/family education   PT Goals Acute Rehab PT Goals PT Goal Formulation: With patient Time For Goal Achievement: 11/01/12 Potential to Achieve Goals: Good Pt will go Supine/Side to Sit: with supervision PT Goal: Supine/Side to Sit - Progress: Goal set today Pt will go Sit to Supine/Side: with supervision PT Goal: Sit to Supine/Side - Progress: Goal set today Pt will go Sit to Stand: with supervision PT Goal: Sit to Stand - Progress: Goal set today Pt will go Stand to Sit: with supervision PT Goal: Stand to Sit - Progress: Goal set today Pt will Ambulate: 51 - 150 feet;with supervision;with rolling walker PT Goal: Ambulate - Progress: Goal set today Pt will Go Up / Down Stairs: 1-2 stairs;with min assist;with least restrictive assistive device PT Goal: Up/Down Stairs - Progress: Goal set today  Visit Information  Last PT Received On: 10/25/12 Assistance Needed: +1    Subjective Data  Subjective: I think I'm having muscle spasms in my Gluts and quads Patient Stated Goal: Resume previous lifestyle with decreased pain   Prior Functioning  Home Living Lives With: Spouse Available Help at Discharge: Family Type of Home: House Home Access: Stairs to enter Secretary/administrator of Steps: 2+1+1 Entrance Stairs-Rails: None Home Layout: One level Home Adaptive Equipment: Walker - rolling Prior Function Level of Independence: Independent Able to Take Stairs?: Yes Driving: Yes Vocation: Full time employment Communication Communication: No difficulties    Cognition  Cognition Overall Cognitive Status: Appears within functional limits for tasks assessed/performed Arousal/Alertness:  Awake/alert Orientation Level: Appears intact for tasks assessed Behavior During Session: [facility] for tasks performed  Extremity/Trunk Assessment Right Upper Extremity Assessment RUE ROM/Strength/Tone: WFL for tasks assessed Left Upper Extremity Assessment LUE ROM/Strength/Tone: WFL for tasks assessed Right Lower Extremity Assessment RLE ROM/Strength/Tone: Deficits RLE ROM/Strength/Tone Deficits: Hip strength 2+/5 with AAROM to 85 flex and 20 abd Left Lower Extremity Assessment LLE ROM/Strength/Tone: WFL for tasks assessed Trunk Assessment Trunk Assessment: Normal   Balance    End of Session PT - End of Session Equipment Utilized During Treatment: Gait belt Activity Tolerance: Patient tolerated treatment well Patient left: in chair;with call bell/phone within reach;with family/visitor present Nurse Communication: Mobility status  GP     Drue Camera 10/25/2012, 5:51 PM

## 2012-10-25 NOTE — Anesthesia Preprocedure Evaluation (Addendum)
Anesthesia Evaluation  Patient identified by MRN, date of birth, ID band Patient awake    Reviewed: Allergy & Precautions, H&P , NPO status , Patient's Chart, lab work & pertinent test results  History of Anesthesia Complications (+) AWARENESS UNDER ANESTHESIA  Airway Mallampati: II TM Distance: >3 FB Neck ROM: Full    Dental no notable dental hx.    Pulmonary neg pulmonary ROS,  breath sounds clear to auscultation  Pulmonary exam normal       Cardiovascular hypertension, Pt. on medications Rhythm:Regular Rate:Normal     Neuro/Psych negative neurological ROS  negative psych ROS   GI/Hepatic negative GI ROS, Neg liver ROS,   Endo/Other  negative endocrine ROS  Renal/GU negative Renal ROS  negative genitourinary   Musculoskeletal negative musculoskeletal ROS (+)   Abdominal   Peds negative pediatric ROS (+)  Hematology negative hematology ROS (+)   Anesthesia Other Findings   Reproductive/Obstetrics negative OB ROS                          Anesthesia Physical Anesthesia Plan  ASA: II  Anesthesia Plan: Spinal   Post-op Pain Management:    Induction: Intravenous  Airway Management Planned: Simple Face Mask  Additional Equipment:   Intra-op Plan:   Post-operative Plan:   Informed Consent: I have reviewed the patients History and Physical, chart, labs and discussed the procedure including the risks, benefits and alternatives for the proposed anesthesia with the patient or authorized representative who has indicated his/her understanding and acceptance.     Plan Discussed with: CRNA and Surgeon  Anesthesia Plan Comments:        Anesthesia Quick Evaluation

## 2012-10-25 NOTE — Care Management Note (Addendum)
    Page 1 of 2   10/26/2012     2:58:32 PM   CARE MANAGEMENT NOTE 10/26/2012  Patient:  Brandon Watts, Brandon Watts   Account Number:  [id number]  Date Initiated:  10/25/2012  Documentation initiated by:  Colleen Can  Subjective/Objective Assessment:   DX TOTAL HIP REPLACEMNT-ANTERIOR -RIGHT     Action/Plan:   CM WILL FOLLOW FOR NEEDS   Anticipated DC Date:  10/28/2012   Anticipated DC Plan:  HOME W HOME HEALTH SERVICES      DC Planning Services  CM consult      [facility] Choice  HOME HEALTH  DURABLE MEDICAL EQUIPMENT   Choice offered to / List presented to:  C-1 Patient   DME arranged  Levan Hurst      DME agency  [facility]     HH arranged  HH-2 PT      [facility] agency  [facility]   Status of service:  Completed, signed off Medicare Important Message given?   (If response is "NO", the following Medicare IM given date fields will be blank) Date Medicare IM given:   Date Additional Medicare IM given:    Discharge Disposition:  HOME W HOME HEALTH SERVICES  Per UR Regulation:  Reviewed for med. necessity/level of care/duration of stay  If discussed at Long Length of Stay Meetings, dates discussed:    Comments:  10/26/2012 Colleen Can BSN RN CCM [phone] CM spoke with patient and spouse. Plans are for patient to return to his home in [place] where spouse will be caregiver. He will require HHpt services ans is requesting Brandon Watts for services. RW has been delivered to pt's room by [facility] rep. Plans are for discharge today. Brandon Watts will start services tomorrow.

## 2012-10-25 NOTE — Transfer of Care (Signed)
Immediate Anesthesia Transfer of Care Note  Patient: Brandon Watts  Procedure(s) Performed: Procedure(s): RIGHT TOTAL HIP ARTHROPLASTY ANTERIOR APPROACH (Right)  Patient Location: PACU  Anesthesia Type:MAC and Spinal  Level of Consciousness: awake, alert , oriented and patient cooperative  Airway & Oxygen Therapy: Patient Spontanous Breathing and Patient connected to face mask oxygen  Post-op Assessment: Report given to PACU RN and Post -op Vital signs reviewed and stable  Post vital signs: Reviewed and stable  Complications: No apparent anesthesia complications

## 2012-10-25 NOTE — Anesthesia Procedure Notes (Signed)
Spinal  Patient location during procedure: OR Staffing Performed by: anesthesiologist  Preanesthetic Checklist Completed: patient identified, site marked, surgical consent, pre-op evaluation, timeout performed, IV checked, risks and benefits discussed and monitors and equipment checked Spinal Block Patient position: sitting Prep: Betadine Patient monitoring: heart rate, continuous pulse ox and blood pressure Location: L3-4 Injection technique: single-shot Needle Needle type: Sprotte  Needle gauge: 24 G Needle length: 9 cm Additional Notes Expiration date of kit checked and confirmed. Patient tolerated procedure well, without complications.     

## 2012-10-25 NOTE — Op Note (Signed)
NAME:  Brandon Watts                ACCOUNT NO.: [id number]      MEDICAL RECORD NO.: [id number]      FACILITY:  [facility]      PHYSICIAN:  Durene Romans D  DATE OF BIRTH:  Nov 19, 1945     DATE OF PROCEDURE:  10/25/2012                                 OPERATIVE REPORT         PREOPERATIVE DIAGNOSIS: Right  hip osteoarthritis.      POSTOPERATIVE DIAGNOSIS:  Right hip osteoarthritis.      PROCEDURE:  Right total hip replacement through an anterior approach   utilizing DePuy THR system, component size 52 pinnacle cup, a size 36+4 neutral   Altrex liner, a size 5 Hi Tri Lock stem with a 36+1.5 delta ceramic   ball.      SURGEON:  Madlyn Frankel. Charlann Boxer, M.D.      ASSISTANT:  Leilani Able, PA-C      ANESTHESIA:  Spinal.      SPECIMENS:  None.      COMPLICATIONS:  None.      BLOOD LOSS:  400 cc     DRAINS:  One Hemovac.      INDICATION OF THE PROCEDURE:  Brandon Watts is a 67 y.o. male who had   presented to office for evaluation of right hip pain.  Radiographs revealed   progressive degenerative changes with bone-on-bone   articulation to the  hip joint.  The patient had painful limited range of   motion significantly affecting their overall quality of life.  The patient was failing to    respond to conservative measures, and at this point was ready   to proceed with more definitive measures.  The patient has noted progressive   degenerative changes in his hip, progressive problems and dysfunction   with regarding the hip prior to surgery.  Consent was obtained for   benefit of pain relief.  Specific risk of infection, DVT, component   failure, dislocation, need for revision surgery, as well discussion of   the anterior versus posterior approach were reviewed.  Consent was   obtained for benefit of anterior pain relief through an anterior   approach.      PROCEDURE IN DETAIL:  The patient was brought to operative theater.   Once adequate anesthesia,  preoperative antibiotics, 2gm Ancef administered.   The patient was positioned supine on the OSI Hanna table.  Once adequate   padding of boney process was carried out, we had predraped out the hip, and  used fluoroscopy to confirm orientation of the pelvis and position.      The right hip was then prepped and draped from proximal iliac crest to   mid thigh with shower curtain technique.      Time-out was performed identifying the patient, planned procedure, and   extremity.     An incision was then made 2 cm distal and lateral to the   anterior superior iliac spine extending over the orientation of the   tensor fascia lata muscle and sharp dissection was carried down to the   fascia of the muscle and protractor placed in the soft tissues.      The fascia was then incised.  The muscle belly was identified and  swept   laterally and retractor placed along the superior neck.  Following   cauterization of the circumflex vessels and removing some pericapsular   fat, a second cobra retractor was placed on the inferior neck.  A third   retractor was placed on the anterior acetabulum after elevating the   anterior rectus.  A L-capsulotomy was along the line of the   superior neck to the trochanteric fossa, then extended proximally and   distally.  Tag sutures were placed and the retractors were then placed   intracapsular.  We then identified the trochanteric fossa and   orientation of my neck cut, confirmed this radiographically   and then made a neck osteotomy with the femur on traction.  The femoral   head was removed without difficulty or complication.  Traction was let   off and retractors were placed posterior and anterior around the   acetabulum.      The labrum and foveal tissue were debrided.  I began reaming with a 46mm   reamer and reamed up to 51mm reamer with good bony bed preparation and a 52   cup was chosen.  The final 52mm Pinnacle cup was then impacted under fluoroscopy  to  confirm the depth of penetration and orientation with respect to   abduction.  A screw was placed followed by the hole eliminator.  The final   36+4 neutral Altrex liner was impacted with good visualized rim fit.  The cup was positioned anatomically within the acetabular portion of the pelvis.      At this point, the femur was rolled at 80 degrees.  Further capsule was   released off the inferior aspect of the femoral neck.  I then   released the superior capsule proximally.  The hook was placed laterally   along the femur and elevated manually and held in position with the bed   hook.  The leg was then extended and adducted with the leg rolled to 100   degrees of external rotation.  Once the proximal femur was fully   exposed, I used a box osteotome to set orientation.  I then began   broaching with the starting chili pepper broach and passed this by hand and then broached up to 5.  With the 5 broach in place I chose a high offset neck and did a trial reduction with a 36+1.5 trial head ball.  The offset was appropriate, leg lengths   appeared to be equal, confirmed radiographically.   Given these findings, I went ahead and dislocated the hip, repositioned all   retractors and positioned the right hip in the extended and abducted position.  The final 5 Hi  Tri Lock stem was   chosen and it was impacted down to the level of neck cut.  Based on this   and the trial reduction, a 36+1.5 delta ceramic ball was chosen and   impacted onto a clean and dry trunnion, and the hip was reduced.  The   hip had been irrigated throughout the case again at this point.  I did   reapproximate the superior capsular leaflet to the anterior leaflet   using #1 Vicryl, placed a medium Hemovac drain deep.  The fascia of the   tensor fascia lata muscle was then reapproximated using #1 Vicryl.  The   remaining wound was closed with 2-0 Vicryl and running 4-0 Monocryl.   The hip was cleaned, dried, and dressed sterilely  using Dermabond and  Aquacel dressing.  Drain site dressed separately.  She was then brought   to recovery room in stable condition tolerating the procedure well.    Leilani Able, PA-C was present for the entirety of the case involved from   preoperative positioning, perioperative retractor management, general   facilitation of the case, as well as primary wound closure as assistant.            Madlyn Frankel Charlann Boxer, M.D.            MDO/MEDQ  D:  05/05/2011  T:  05/05/2011  Job:  [id number]      Electronically Signed by Durene Romans M.D. on 05/11/2011 09:15:38 AM

## 2012-10-25 NOTE — Interval H&P Note (Signed)
History and Physical Interval Note:  10/25/2012 6:36 AM  Brandon Watts  has presented today for surgery, with the diagnosis of right hip osteoarthritis  The various methods of treatment have been discussed with the patient and family. After consideration of risks, benefits and other options for treatment, the patient has consented to  Procedure(s): RIGHT TOTAL HIP ARTHROPLASTY ANTERIOR APPROACH (Right) as a surgical intervention .  The patient's history has been reviewed, patient examined, no change in status, stable for surgery.  I have reviewed the patient's chart and labs.  Questions were answered to the patient's satisfaction.     Shelda Pal

## 2012-10-25 NOTE — Anesthesia Postprocedure Evaluation (Signed)
  Anesthesia Post-op Note  Patient: Brandon Watts  Procedure(s) Performed: Procedure(s) (LRB): RIGHT TOTAL HIP ARTHROPLASTY ANTERIOR APPROACH (Right)  Patient Location: PACU  Anesthesia Type: Spinal  Level of Consciousness: awake and alert   Airway and Oxygen Therapy: Patient Spontanous Breathing  Post-op Pain: mild  Post-op Assessment: Post-op Vital signs reviewed, Patient's Cardiovascular Status Stable, Respiratory Function Stable, Patent Airway and No signs of Nausea or vomiting  Last Vitals:  Filed Vitals:   10/25/12 0930  BP: 112/92  Pulse: 62  Temp:   Resp: 17    Post-op Vital Signs: stable   Complications: No apparent anesthesia complications

## 2012-10-25 NOTE — Preoperative (Signed)
Beta Blockers   Reason not to administer Beta Blockers:Not Applicable 

## 2012-10-25 NOTE — Progress Notes (Signed)
Received orders for rw and commode.  Both will be delivered to the room prior to d/c.

## 2012-10-26 ENCOUNTER — Encounter (HOSPITAL_COMMUNITY): Payer: Self-pay | Admitting: Orthopedic Surgery

## 2012-10-26 DIAGNOSIS — E663 Overweight: Secondary | ICD-10-CM

## 2012-10-26 DIAGNOSIS — D500 Iron deficiency anemia secondary to blood loss (chronic): Secondary | ICD-10-CM

## 2012-10-26 LAB — BASIC METABOLIC PANEL
BUN: 19 mg/dL (ref 6–23)
CO2: 29 mEq/L (ref 19–32)
Chloride: 104 mEq/L (ref 96–112)
Creatinine, Ser: 0.94 mg/dL (ref 0.50–1.35)
GFR calc Af Amer: >90 mL/min (ref >90)

## 2012-10-26 LAB — CBC
HCT: 34.8 % — ABNORMAL LOW (ref 39.0–52.0)
MCV: 90.2 fL (ref 78.0–100.0)
RDW: 12 % (ref 11.5–15.5)
WBC: 13.1 10*3/uL — ABNORMAL HIGH (ref 4.0–10.5)

## 2012-10-26 MED ORDER — ASPIRIN EC 325 MG PO TBEC: 325.0000 mg | DELAYED_RELEASE_TABLET | Freq: Two times a day (BID) | ORAL | Status: DC

## 2012-10-26 MED ORDER — POLYETHYLENE GLYCOL 3350 17 G PO PACK: 17.0000 g | PACK | Freq: Every day | ORAL | Status: DC | PRN

## 2012-10-26 MED ORDER — DSS 100 MG PO CAPS: 100.0000 mg | ORAL_CAPSULE | Freq: Two times a day (BID) | ORAL | Status: DC

## 2012-10-26 MED ORDER — TRAMADOL HCL 50 MG PO TABS: 50.0000 mg | ORAL_TABLET | Freq: Four times a day (QID) | ORAL | Status: DC | PRN

## 2012-10-26 MED ORDER — METHOCARBAMOL 500 MG PO TABS: 500.0000 mg | ORAL_TABLET | Freq: Four times a day (QID) | ORAL | Status: DC | PRN

## 2012-10-26 MED ORDER — FERROUS SULFATE 325 (65 FE) MG PO TABS: 325.0000 mg | ORAL_TABLET | Freq: Three times a day (TID) | ORAL | Status: DC

## 2012-10-26 NOTE — Evaluation (Signed)
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  All education was completed.  He does not need any further OT.    OT Assessment  Patient does not need any further OT services    Follow Up Recommendations  No OT follow up    Barriers to Discharge      Equipment Recommendations  None recommended by OT    Recommendations for Other Services    Frequency       Precautions / Restrictions Precautions Precautions: Fall Restrictions Weight Bearing Restrictions: No Other Position/Activity Restrictions: WBAT   Pertinent Vitals/Pain After waliking to bathroom, pt could feel R hip.  Repositioned with ice    ADL  Toilet Transfer: Supervision/safety Toilet Transfer Method: Sit to stand Toilet Transfer Equipment: Comfort height toilet (grab bar as vanity) Toileting - Clothing Manipulation and Hygiene: Supervision/safety Where Assessed - Toileting Clothing Manipulation and Hygiene: Sit to stand from 3-in-1 or toilet Tub/Shower Transfer: Supervision/safety Tub/Shower Transfer Method: Stand pivot Psychologist, educational: Walk in Scientist, research (physical sciences) Used: Rolling walker Transfers/Ambulation Related to ADLs: pt moves with supervision ADL Comments: Pt needs min A for LB ADLs--wife will assist.  set up for UB.  Pt does not want 3:1--plans to get up stairs to use comfort height commode when he needs to.  Anticipate he could use standard if necessary--has vanity next to it.  Not interested in AE    OT Diagnosis:    OT Problem List:   OT Treatment Interventions:     OT Goals    Visit Information  Last OT Received On: 10/26/12 Assistance Needed: +1    Subjective Data  Subjective: I want to get up to my bathroom upstairs.  I have a steam  bath (step in) Patient Stated Goal: none stated.     Prior Functioning     Home Living Lives With: Spouse Home Layout: Two level;Able to live on main level with bedroom/bathroom Bathroom Shower/Tub: Walk-in shower (built in, pull down seat and grab bar) Bathroom Toilet: Handicapped height (upstairs; standard downstairs with vanity) Additional Comments: main level low commode; upstairs 17 1/2" Prior Function Level of Independence: Independent Driving: Yes Vocation: Full time employment Communication Communication: No difficulties         Vision/Perception     Cognition  Cognition Arousal/Alertness: Awake/alert Behavior During Therapy: WFL for tasks assessed/performed Overall Cognitive Status: Within Functional Limits for tasks assessed    Extremity/Trunk Assessment Right Upper Extremity Assessment RUE ROM/Strength/Tone: WFL for tasks assessed Left Upper Extremity Assessment LUE ROM/Strength/Tone: WFL for tasks assessed     Mobility Bed Mobility Bed Mobility: Supine to Sit Supine to Sit: 4: Min guard Details for Bed Mobility Assistance: cues for sequence and use of L LE to self assist Transfers Sit to Stand: 5: Supervision;From chair/3-in-1;With armrests Stand to Sit: 4: Min guard Details for Transfer Assistance: no cues needed this session     Exercise    Balance     End of Session OT - End of Session Activity Tolerance: Patient tolerated treatment well Patient left: in chair;with call bell/phone within reach;with family/visitor present  GO     Kshawn Canal 10/26/2012, 9:29 AM Marica Otter, OTR/L [phone] 10/26/2012

## 2012-10-26 NOTE — Progress Notes (Signed)
Physical Therapy Treatment Patient Details Name: Brandon Watts MRN: [id number] DOB: 1946-04-12 Today's Date: 10/26/2012 Time: 0865-7846 PT Time Calculation (min): 34 min  PT Assessment / Plan / Recommendation Comments on Treatment Session       Follow Up Recommendations  Home health PT     Does the patient have the potential to tolerate intense rehabilitation     Barriers to Discharge        Equipment Recommendations  None recommended by PT    Recommendations for Other Services OT consult  Frequency 7X/week   Plan Discharge plan remains appropriate    Precautions / Restrictions Precautions Precautions: Fall Restrictions Weight Bearing Restrictions: No Other Position/Activity Restrictions: WBAT   Pertinent Vitals/Pain 2/10; premed, ice packs provided    Mobility  Bed Mobility Bed Mobility: Supine to Sit Supine to Sit: 4: Min guard Details for Bed Mobility Assistance: cues for sequence and use of L LE to self assist Transfers Transfers: Sit to Stand;Stand to Sit Sit to Stand: 4: Min guard Stand to Sit: 4: Min guard Details for Transfer Assistance: cues for LE management and use of UEs to self assist Ambulation/Gait Ambulation/Gait Assistance: 4: Min guard;5: Supervision Ambulation Distance (Feet): 400 Feet Assistive device: Rolling walker Ambulation/Gait Assistance Details: cues for position from RW, initial sequence and ER on R Gait Pattern: Step-through pattern;Step-to pattern Stairs: No    Exercises Total Joint Exercises Ankle Circles/Pumps: AROM;15 reps;Supine;Both Quad Sets: AROM;Supine;15 reps;Both Gluteal Sets: AROM;Both;10 reps;Supine Heel Slides: AAROM;Supine;Right;20 reps Hip ABduction/ADduction: AAROM;Supine;Right;15 reps Long Arc Quad: AROM;20 reps;Supine;AAROM;Right   PT Diagnosis:    PT Problem List:   PT Treatment Interventions:     PT Goals Acute Rehab PT Goals PT Goal Formulation: With patient Time For Goal Achievement:  11/01/12 Potential to Achieve Goals: Good Pt will go Supine/Side to Sit: with supervision PT Goal: Supine/Side to Sit - Progress: Progressing toward goal Pt will go Sit to Supine/Side: with supervision PT Goal: Sit to Supine/Side - Progress: Progressing toward goal Pt will go Sit to Stand: with supervision PT Goal: Sit to Stand - Progress: Progressing toward goal Pt will go Stand to Sit: with supervision PT Goal: Stand to Sit - Progress: Progressing toward goal Pt will Ambulate: 51 - 150 feet;with supervision;with rolling walker PT Goal: Ambulate - Progress: Progressing toward goal Pt will Go Up / Down Stairs: 1-2 stairs;with min assist;with least restrictive assistive device  Visit Information  Last PT Received On: 10/26/12 Assistance Needed: +1    Subjective Data  Subjective: I'm doing so much better than yesterday Patient Stated Goal: Resume previous lifestyle with decreased pain   Cognition  Cognition Arousal/Alertness: Awake/alert Behavior During Therapy: WFL for tasks assessed/performed Overall Cognitive Status: Within Functional Limits for tasks assessed    Balance     End of Session PT - End of Session Activity Tolerance: Patient tolerated treatment well Patient left: in chair;with call bell/phone within reach;with family/visitor present Nurse Communication: Mobility status   GP     Brandon Watts 10/26/2012, 8:48 AM

## 2012-10-26 NOTE — Progress Notes (Signed)
   Subjective: 1 Day Post-Op Procedure(s) (LRB): RIGHT TOTAL HIP ARTHROPLASTY ANTERIOR APPROACH (Right)   Patient reports pain as mild, pain well controlled. No events throughout the night. Ready to be discharged home.  Objective:   VITALS:   Filed Vitals:   10/26/12 0520  BP: 128/70  Pulse: 69  Temp: 98 F (36.7 C)  Resp: 14    Neurovascular intact Dorsiflexion/Plantar flexion intact Incision: dressing C/D/I No cellulitis present Compartment soft  LABS  Recent Labs  10/26/12 0444  HGB 12.2*  HCT 34.8*  WBC 13.1*  PLT 125*     Recent Labs  10/26/12 0444  NA 138  K 4.4  BUN 19  CREATININE 0.94  GLUCOSE 137*     Assessment/Plan: 1 Day Post-Op Procedure(s) (LRB): RIGHT TOTAL HIP ARTHROPLASTY ANTERIOR APPROACH (Right) HV drain d/c'ed Foley cath d/c'ed Advance diet Up with therapy D/C IV fluids Discharge home with home health Follow up in 2 weeks at [facility]. Follow up with OLIN,Natallie Ravenscroft D in 2 weeks.  Contact information:  [facility] [address] [place] [address] [phone]    Expected ABLA  Treated with iron and will observe  Overweight (BMI 25-29.9) Estimated body mass index is 29.44 kg/(m^2) as calculated from the following:   Height as of this encounter: 5\' 7"  (1.702 m).   Weight as of this encounter: 85.276 kg (188 lb). Patient also counseled that weight may inhibit the healing process Patient counseled that losing weight will help with future health issues     Anastasio Auerbach. Analyce Tavares   PAC  10/26/2012, 9:24 AM

## 2012-10-26 NOTE — Plan of Care (Signed)
Problem: Consults Goal: Diagnosis- Total Joint Replacement Outcome: Completed/Met Date Met:  10/26/12 Primary Total Hip RIGHT Anterior

## 2012-10-26 NOTE — Progress Notes (Signed)
Physical Therapy Treatment Patient Details Name: WRAY GOEHRING MRN: [id number] DOB: 12-11-1945 Today's Date: 10/26/2012 Time: 4098-1191 PT Time Calculation (min): 16 min  PT Assessment / Plan / Recommendation Comments on Treatment Session  Reviewed car transfers with pt    Follow Up Recommendations        Does the patient have the potential to tolerate intense rehabilitation     Barriers to Discharge        Equipment Recommendations  None recommended by PT    Recommendations for Other Services    Frequency 7X/week   Plan Discharge plan remains appropriate    Precautions / Restrictions Precautions Precautions: Fall Restrictions Weight Bearing Restrictions: No Other Position/Activity Restrictions: WBAT   Pertinent Vitals/Pain Min c/o pain    Mobility  Bed Mobility Bed Mobility: Supine to Sit;Sit to Supine Supine to Sit: 5: Supervision Sit to Supine: 5: Supervision Transfers Transfers: Sit to Stand;Stand to Sit Sit to Stand: 5: Supervision Stand to Sit: 5: Supervision Ambulation/Gait Ambulation/Gait Assistance: 5: Supervision Ambulation Distance (Feet): 444 Feet Assistive device: Rolling walker Ambulation/Gait Assistance Details: min cues for position from RW and ER on R Gait Pattern: Step-through pattern Stairs: Yes Stairs Assistance: 4: Min guard;4: Min assist Stairs Assistance Details (indicate cue type and reason): cues for sequence and foot placement Stair Management Technique: No rails;One rail Right;Step to pattern;Forwards Number of Stairs: 8    Exercises     PT Diagnosis:    PT Problem List:   PT Treatment Interventions:     PT Goals Acute Rehab PT Goals PT Goal Formulation: With patient Time For Goal Achievement: 11/01/12 Potential to Achieve Goals: Good Pt will go Supine/Side to Sit: with supervision PT Goal: Supine/Side to Sit - Progress: Met Pt will go Sit to Supine/Side: with supervision PT Goal: Sit to Supine/Side - Progress: Met Pt  will go Sit to Stand: with supervision PT Goal: Sit to Stand - Progress: Met Pt will go Stand to Sit: with supervision PT Goal: Stand to Sit - Progress: Met Pt will Ambulate: 51 - 150 feet;with supervision;with rolling walker PT Goal: Ambulate - Progress: Met Pt will Go Up / Down Stairs: 1-2 stairs;with min assist;with least restrictive assistive device PT Goal: Up/Down Stairs - Progress: Met  Visit Information  Last PT Received On: 10/26/12 Assistance Needed: +1    Subjective Data  Subjective: I am leaving at 3 Patient Stated Goal: Resume previous lifestyle with decreased pain   Cognition  Cognition Arousal/Alertness: Awake/alert Behavior During Therapy: WFL for tasks assessed/performed Overall Cognitive Status: Within Functional Limits for tasks assessed    Balance     End of Session PT - End of Session Activity Tolerance: Patient tolerated treatment well Patient left: in bed;with call bell/phone within reach;with nursing in room Nurse Communication: Mobility status   GP     Shynice Sigel 10/26/2012, 1:48 PM

## 2012-10-27 NOTE — Discharge Summary (Signed)
Physician Discharge Summary  Patient ID: KOL Watts MRN: [id number] DOB/AGE: 07-29-1945 67 y.o.  Admit date: 10/25/2012 Discharge date: 10/26/2012   Procedures:  Procedure(s) (LRB): RIGHT TOTAL HIP ARTHROPLASTY ANTERIOR APPROACH (Right)  Attending Physician:  Dr. Durene Romans   Admission Diagnoses:   Right hip OA / pain  Discharge Diagnoses:  Principal Problem:   S/P right THA, AA Active Problems:   Expected blood loss anemia   Overweight (BMI 25.0-29.9)  Past Medical History  Diagnosis Date  . Allergic rhinitis   . Insomnia - CHRONIC   . Hypertension   . Pulmonary nodules   . Hepatitis - HEPATITIS C -CLEARED   . Positive PPD, treated   . Arthritis - PAIN AND OA RIGHT HIP   . DDD (degenerative disc disease), lumbar     HPI:   Brandon Watts, 67 y.o. male, has a history of pain and functional disability in the right hip(s) due to arthritis and patient has failed non-surgical conservative treatments for greater than 12 weeks to include NSAID's and/or analgesics, supervised PT with diminished ADL's post treatment and activity modification. Onset of symptoms was gradual starting 3 years ago with gradually worsening course since that time.The patient noted no past surgery on the right hip(s). Patient currently rates pain in the right hip at 8 out of 10 with activity. Patient has night pain, worsening of pain with activity and weight bearing, pain that interfers with activities of daily living and pain with passive range of motion. Patient has evidence of periarticular osteophytes and joint space narrowing by imaging studies. This condition presents safety issues increasing the risk of falls. There is no current active infection. Risks, benefits and expectations were discussed with the patient. Patient understand the risks, benefits and expectations and wishes to proceed with surgery.  PCP: Brandon Dubonnet, MD   Discharged Condition: good  Hospital Course:  Patient  underwent the above stated procedure on 10/25/2012. Patient tolerated the procedure well and brought to the recovery room in good condition and subsequently to the floor.  POD #1 BP: 128/70 ; Pulse: 69 ; Temp: 98 F (36.7 C) ; Resp: 14  Pt's foley was removed, as well as the hemovac drain removed. IV was changed to a saline lock. Patient reports pain as mild, pain well controlled. No events throughout the night. Ready to be discharged home. Neurovascular intact, dorsiflexion/plantar flexion intact, incision: dressing C/D/I, no cellulitis present and compartment soft.   LABS  Basename  10/26/12    0444   HGB  12.2  HCT  34.8    Discharge Exam: General appearance: alert, cooperative and no distress Extremities: Homans sign is negative, no sign of DVT, no edema, redness or tenderness in the calves or thighs and no ulcers, gangrene or trophic changes  Disposition:   Home-Health Care Svc with follow up in 2 weeks   Follow-up Information   Follow up with Shelda Pal, MD. Schedule an appointment as soon as possible for a visit in 2 weeks.   Contact information:   [address] [place] [address] [phone]       Discharge Orders   Future Orders Complete By Expires     Call MD / Call 911  As directed     Comments:      If you experience chest pain or shortness of breath, CALL 911 and be transported to the hospital emergency room.  If you develope a fever above 101 F, pus (white drainage) or  increased drainage or redness at the wound, or calf pain, call your surgeon's office.    Change dressing  As directed     Comments:      Maintain surgical dressing for 10-14 days, then replace with 4x4 guaze and tape. Keep the area dry and clean.    Constipation Prevention  As directed     Comments:      Drink plenty of fluids.  Prune juice may be helpful.  You may use a stool softener, such as Colace (over the counter) 100 mg twice a day.  Use MiraLax (over the counter) for  constipation as needed.    Diet - low sodium heart healthy  As directed     Discharge instructions  As directed     Comments:      Maintain surgical dressing for 10-14 days, then replace with gauze and tape. Keep the area dry and clean until follow up. Follow up in 2 weeks at [facility]. Call with any questions or concerns.    Increase activity slowly as tolerated  As directed     TED hose  As directed     Comments:      Use stockings (TED hose) for 2 weeks on both leg(s).  You may remove them at night for sleeping.    Weight bearing as tolerated  As directed          Medication List    TAKE these medications       ALPRAZolam 0.25 MG tablet  Commonly known as:  XANAX  Take 0.25 mg by mouth once.     aspirin EC 325 MG tablet  Take 1 tablet (325 mg total) by mouth 2 (two) times daily.     celecoxib 200 MG capsule  Commonly known as:  CELEBREX  Take 1 capsule (200 mg total) by mouth daily.     DSS 100 MG Caps  Take 100 mg by mouth 2 (two) times daily.     ferrous sulfate 325 (65 FE) MG tablet  Take 1 tablet (325 mg total) by mouth 3 (three) times daily after meals.     fluticasone 50 MCG/ACT nasal spray  Commonly known as:  FLONASE  Place 2 sprays into the nose daily.     losartan 50 MG tablet  Commonly known as:  COZAAR  Take 50 mg by mouth every evening.     methocarbamol 500 MG tablet  Commonly known as:  ROBAXIN  Take 1 tablet (500 mg total) by mouth every 6 (six) hours as needed.     mometasone 50 MCG/ACT nasal spray  Commonly known as:  NASONEX  Place into the nose daily.     polyethylene glycol packet  Commonly known as:  MIRALAX / GLYCOLAX  Take 17 g by mouth daily as needed.     traMADol 50 MG tablet  Commonly known as:  ULTRAM  Take 1-2 tablets (50-100 mg total) by mouth every 6 (six) hours as needed for pain.     zaleplon 10 MG capsule  Commonly known as:  SONATA  Take 10 mg by mouth as needed (sleep).         Signed: Anastasio Auerbach.  Natascha Edmonds   PAC  10/27/2012, 9:09 AM

## 2013-02-15 ENCOUNTER — Other Ambulatory Visit: Payer: Self-pay

## 2013-04-05 ENCOUNTER — Other Ambulatory Visit: Payer: Self-pay

## 2013-04-05 MED ORDER — CLOBETASOL PROPIONATE 0.05 % EX SHAM: MEDICATED_SHAMPOO | CUTANEOUS | Status: DC

## 2013-05-18 ENCOUNTER — Other Ambulatory Visit: Payer: Self-pay

## 2013-06-12 ENCOUNTER — Telehealth: Payer: Self-pay | Admitting: *Deleted

## 2013-06-12 MED ORDER — PROMETHAZINE-CODEINE 6.25-10 MG/5ML PO SYRP: 5.0000 mL | ORAL_SOLUTION | ORAL | Status: DC | PRN

## 2013-06-12 NOTE — Telephone Encounter (Signed)
Pt requests Rf on prometh/codein cough syrup. Ok per Dr. Posey Rea to phone in 1 tsp q 6 prn cough. # 300 ml with no refills. Rx phoned in to [facility]. Pt aware.

## 2013-06-12 NOTE — Telephone Encounter (Signed)
C/o URI w/severe cough Rx: Prom-cod syr 300 ml Thx

## 2013-07-11 ENCOUNTER — Ambulatory Visit (INDEPENDENT_AMBULATORY_CARE_PROVIDER_SITE_OTHER): Payer: 59 | Admitting: Family Medicine

## 2013-07-11 ENCOUNTER — Other Ambulatory Visit (INDEPENDENT_AMBULATORY_CARE_PROVIDER_SITE_OTHER): Payer: 59

## 2013-07-11 ENCOUNTER — Encounter: Payer: Self-pay | Admitting: Family Medicine

## 2013-07-11 DIAGNOSIS — M6530 Trigger finger, unspecified finger: Secondary | ICD-10-CM

## 2013-07-11 NOTE — Progress Notes (Signed)
  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 Patient has had these for years but the increased frequency and started to occur. Patient denies any numbness. States it is not any significant pain but starting to daily activities. Patient does type significantly which is repetitive motion. Denies any other finger causing similar symptoms. Patient that the severity a 4/10 secondary to the increasing frequency.   Past medical, surgical, family and social history reviewed. Medications reviewed all in the electronic medical record.   Review of Systems: No headache, visual changes, nausea, vomiting, diarrhea, constipation, dizziness, abdominal pain, skin rash, fevers, chills, night sweats, weight loss, swollen lymph nodes, body aches, joint swelling, muscle aches, chest pain, shortness of breath, mood changes.   Objective:    There were no vitals taken for this visit.   General: No apparent distress alert and oriented x3 mood and affect normal, dressed appropriately.  HEENT: Pupils equal, extraocular movements intact Respiratory: Patient's speak in full sentences and does not appear short of breath Cardiovascular: No lower extremity edema, non tender, no erythema Skin: Warm dry intact with no signs of infection or rash on extremities or on axial skeleton. Abdomen: Soft nontender Neuro: Cranial nerves II through XII are intact, neurovascularly intact in all extremities with 2+ DTRs and 2+ pulses. Lymph: No lymphadenopathy of posterior or anterior cervical chain or axillae bilaterally.  Gait normal with good balance and coordination.  MSK: Non tender with full range of motion and good stability and symmetric strength and tone of shoulders, elbows, wrist, hip, knee and ankles bilaterally.  Right hand shows the patient does have a nodule palpated on the A-2 pulley of the right ring finger. This is triggering.  Limited muscular  skeletal ultrasound was performed and interpreted by Antoine Primas, M Patient's tendon sheaths shows that he does have hypoechoic changes just proximal to the PIP joint. This corresponds well to fluid. Mild nodule noted. Patient does have some mild underlying calcifications in this area as well.  After verbal consent patient was prepped with alcohol swabs and with a 26-gauge half-inch needle was injected with 1 cc of 0.5% Marcaine and 1 cc of Kenalog 40 mg/dL. Patient tolerated the procedure well. No blood loss and Band-Aid placed.  Impression and Recommendations:     This case required medical decision making of moderate complexity.

## 2013-07-11 NOTE — Assessment & Plan Note (Signed)
Patient had an injection and did very well. Patient told to try to do some warm baths and increasing range of motion slowly. Discuss that this can take up to a week to see benefit. Patient did have some mild decreased introverting immediately. Patient can follow up on an as-needed basis.

## 2013-08-04 ENCOUNTER — Telehealth: Payer: Self-pay | Admitting: Internal Medicine

## 2013-08-04 MED ORDER — PROMETHAZINE-CODEINE 6.25-10 MG/5ML PO SYRP: 5.0000 mL | ORAL_SOLUTION | ORAL | Status: DC | PRN

## 2013-10-19 DIAGNOSIS — H25019 Cortical age-related cataract, unspecified eye: Secondary | ICD-10-CM | POA: Diagnosis not present

## 2013-10-19 DIAGNOSIS — H18419 Arcus senilis, unspecified eye: Secondary | ICD-10-CM | POA: Diagnosis not present

## 2013-10-19 DIAGNOSIS — H04129 Dry eye syndrome of unspecified lacrimal gland: Secondary | ICD-10-CM | POA: Diagnosis not present

## 2013-10-19 DIAGNOSIS — H2510 Age-related nuclear cataract, unspecified eye: Secondary | ICD-10-CM | POA: Diagnosis not present

## 2013-11-08 ENCOUNTER — Other Ambulatory Visit: Payer: Self-pay | Admitting: Internal Medicine

## 2013-11-08 DIAGNOSIS — I10 Essential (primary) hypertension: Secondary | ICD-10-CM | POA: Diagnosis not present

## 2013-11-08 DIAGNOSIS — G4700 Insomnia, unspecified: Secondary | ICD-10-CM | POA: Diagnosis not present

## 2013-11-08 DIAGNOSIS — R109 Unspecified abdominal pain: Secondary | ICD-10-CM | POA: Diagnosis not present

## 2013-11-08 DIAGNOSIS — Z1331 Encounter for screening for depression: Secondary | ICD-10-CM | POA: Diagnosis not present

## 2013-11-08 DIAGNOSIS — M1610 Unilateral primary osteoarthritis, unspecified hip: Secondary | ICD-10-CM | POA: Diagnosis not present

## 2013-11-08 DIAGNOSIS — Z0000 Encounter for general adult medical examination without abnormal findings: Secondary | ICD-10-CM | POA: Diagnosis not present

## 2013-11-08 DIAGNOSIS — J309 Allergic rhinitis, unspecified: Secondary | ICD-10-CM | POA: Diagnosis not present

## 2013-11-08 DIAGNOSIS — M169 Osteoarthritis of hip, unspecified: Secondary | ICD-10-CM | POA: Diagnosis not present

## 2013-11-13 ENCOUNTER — Other Ambulatory Visit: Payer: 59

## 2013-11-13 ENCOUNTER — Other Ambulatory Visit: Payer: Self-pay | Admitting: Internal Medicine

## 2013-11-13 DIAGNOSIS — R109 Unspecified abdominal pain: Secondary | ICD-10-CM

## 2013-11-15 DIAGNOSIS — Z9660 Presence of unspecified orthopedic joint implant: Secondary | ICD-10-CM | POA: Diagnosis not present

## 2013-11-16 ENCOUNTER — Other Ambulatory Visit: Payer: 59

## 2013-11-16 ENCOUNTER — Ambulatory Visit
Admission: RE | Admit: 2013-11-16 | Discharge: 2013-11-16 | Disposition: A | Discharge status: Home / Self Care | Payer: Medicare Other | Source: Ambulatory Visit | Attending: Internal Medicine | Admitting: Internal Medicine

## 2013-11-16 ENCOUNTER — Inpatient Hospital Stay: Admission: RE | Admit: 2013-11-16 | Payer: 59 | Source: Ambulatory Visit

## 2013-11-16 DIAGNOSIS — N200 Calculus of kidney: Secondary | ICD-10-CM | POA: Diagnosis not present

## 2013-11-16 DIAGNOSIS — R109 Unspecified abdominal pain: Secondary | ICD-10-CM

## 2013-11-16 DIAGNOSIS — N281 Cyst of kidney, acquired: Secondary | ICD-10-CM | POA: Diagnosis not present

## 2013-12-03 DIAGNOSIS — H43819 Vitreous degeneration, unspecified eye: Secondary | ICD-10-CM | POA: Diagnosis not present

## 2013-12-12 DIAGNOSIS — H43819 Vitreous degeneration, unspecified eye: Secondary | ICD-10-CM | POA: Diagnosis not present

## 2014-01-28 ENCOUNTER — Other Ambulatory Visit: Payer: Self-pay | Admitting: Oncology

## 2014-02-17 ENCOUNTER — Other Ambulatory Visit: Payer: Self-pay | Admitting: Internal Medicine

## 2014-02-25 NOTE — Telephone Encounter (Signed)
A user error has taken place: encounter opened in error, closed for administrative reasons.

## 2014-05-05 IMAGING — CR DG PORTABLE PELVIS
1 series · 1 of 1 positions shown · non-contrast
Comparison: One-view abdomen 10/25/2009.

CLINICAL DATA: Postop right total hip arthroplasty.

PORTABLE PELVIS

[AP]
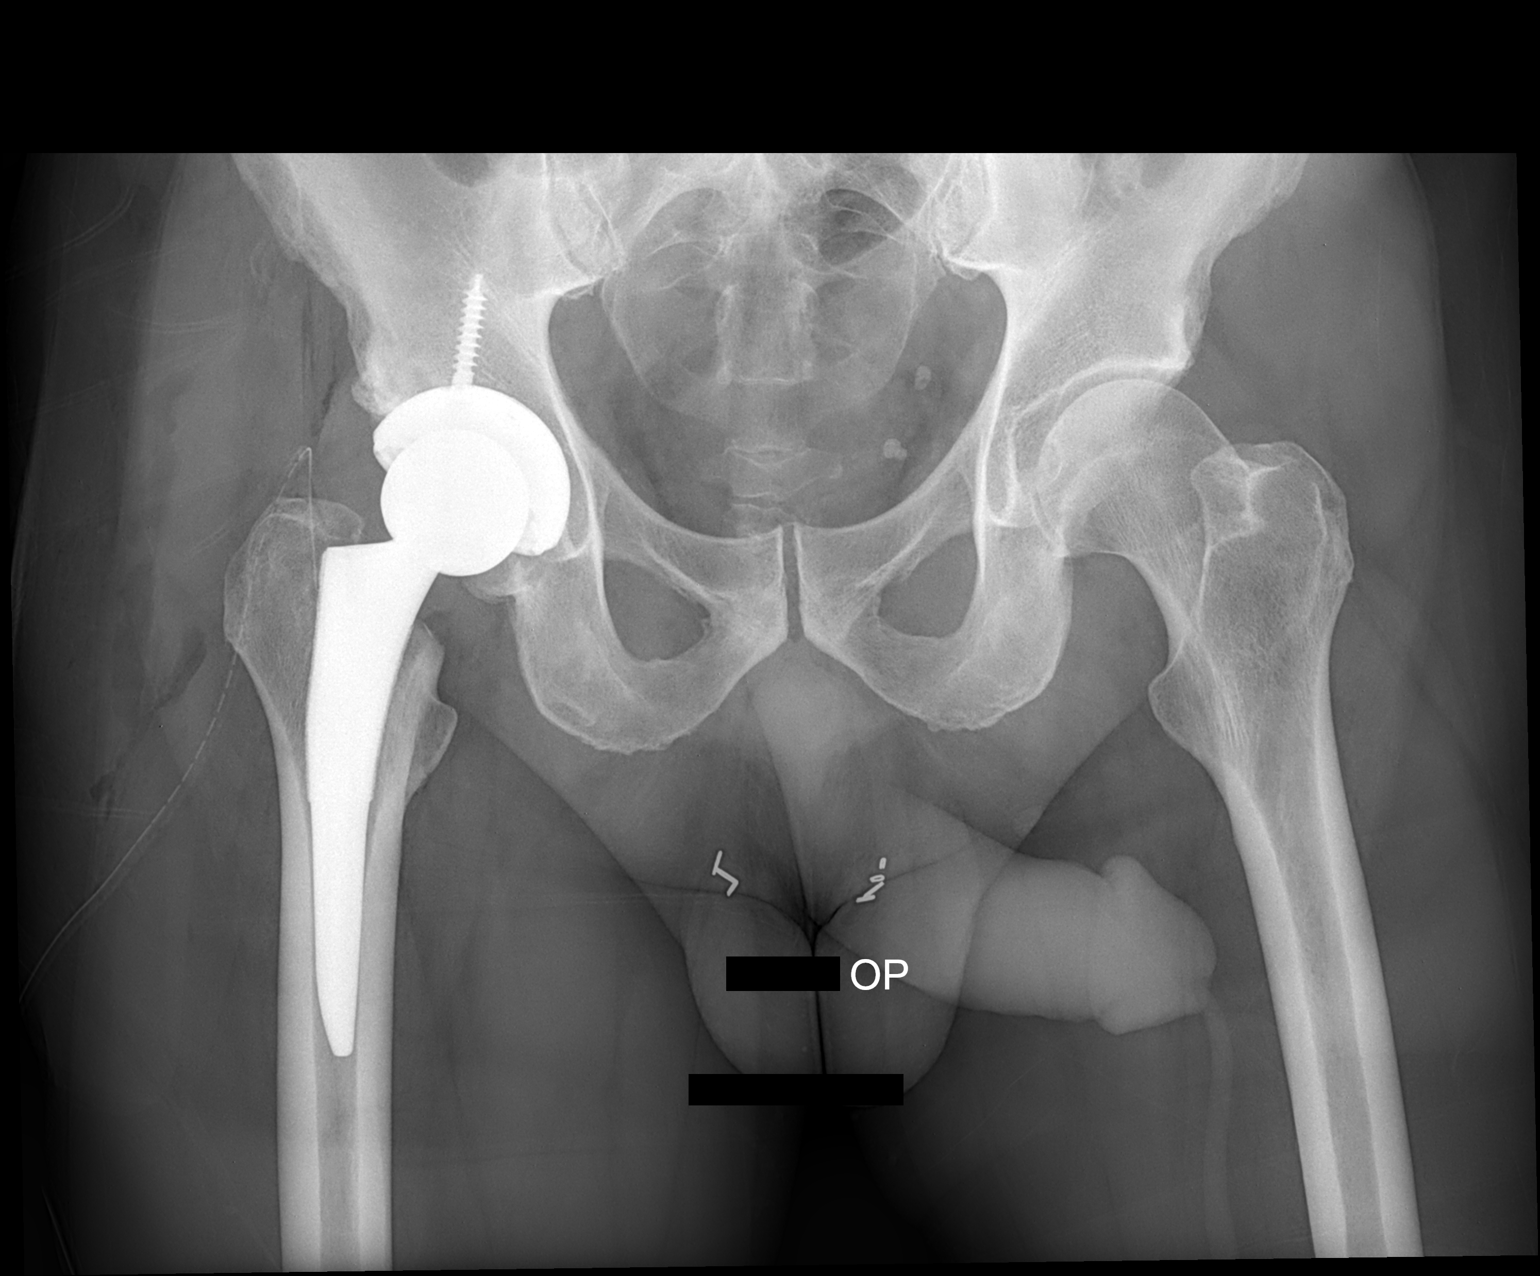

[1 of 1 positions shown; findings below may reference images not displayed]

FINDINGS: 2113 hours.  Right total hip arthroplasty appears well
positioned.  A surgical drain is in place.  There is some gas
within the soft tissues lateral to the right hip.  There is no
evidence of acute fracture or dislocation.  Left pelvic
calcifications are stable, consistent with phleboliths.  Vasectomy
clips are noted.
IMPRESSION: No demonstrated complication following right total hip
arthroplasty.

## 2014-05-18 ENCOUNTER — Ambulatory Visit
Admission: RE | Admit: 2014-05-18 | Discharge: 2014-05-18 | Disposition: A | Discharge status: Home / Self Care | Payer: Medicare Other | Source: Ambulatory Visit | Attending: Internal Medicine | Admitting: Internal Medicine

## 2014-05-18 ENCOUNTER — Other Ambulatory Visit: Payer: Self-pay | Admitting: Internal Medicine

## 2014-05-18 DIAGNOSIS — M4316 Spondylolisthesis, lumbar region: Secondary | ICD-10-CM | POA: Diagnosis not present

## 2014-05-18 DIAGNOSIS — M47814 Spondylosis without myelopathy or radiculopathy, thoracic region: Secondary | ICD-10-CM | POA: Diagnosis not present

## 2014-05-18 DIAGNOSIS — M5416 Radiculopathy, lumbar region: Secondary | ICD-10-CM

## 2014-05-18 DIAGNOSIS — Z791 Long term (current) use of non-steroidal anti-inflammatories (NSAID): Secondary | ICD-10-CM | POA: Diagnosis not present

## 2014-05-18 DIAGNOSIS — M546 Pain in thoracic spine: Secondary | ICD-10-CM

## 2014-05-18 DIAGNOSIS — R312 Other microscopic hematuria: Secondary | ICD-10-CM | POA: Diagnosis not present

## 2014-05-18 DIAGNOSIS — M47816 Spondylosis without myelopathy or radiculopathy, lumbar region: Secondary | ICD-10-CM | POA: Diagnosis not present

## 2014-05-18 DIAGNOSIS — E785 Hyperlipidemia, unspecified: Secondary | ICD-10-CM | POA: Diagnosis not present

## 2014-05-21 DIAGNOSIS — E785 Hyperlipidemia, unspecified: Secondary | ICD-10-CM | POA: Diagnosis not present

## 2014-05-21 DIAGNOSIS — M5417 Radiculopathy, lumbosacral region: Secondary | ICD-10-CM | POA: Diagnosis not present

## 2014-05-21 DIAGNOSIS — M65341 Trigger finger, right ring finger: Secondary | ICD-10-CM | POA: Diagnosis not present

## 2014-05-21 DIAGNOSIS — R312 Other microscopic hematuria: Secondary | ICD-10-CM | POA: Diagnosis not present

## 2014-05-21 DIAGNOSIS — M7989 Other specified soft tissue disorders: Secondary | ICD-10-CM | POA: Diagnosis not present

## 2014-06-05 ENCOUNTER — Other Ambulatory Visit (INDEPENDENT_AMBULATORY_CARE_PROVIDER_SITE_OTHER): Payer: Self-pay

## 2014-06-05 DIAGNOSIS — R2242 Localized swelling, mass and lump, left lower limb: Secondary | ICD-10-CM

## 2014-06-12 ENCOUNTER — Other Ambulatory Visit (INDEPENDENT_AMBULATORY_CARE_PROVIDER_SITE_OTHER): Payer: Self-pay | Admitting: General Surgery

## 2014-06-12 DIAGNOSIS — R2242 Localized swelling, mass and lump, left lower limb: Secondary | ICD-10-CM

## 2014-06-18 DIAGNOSIS — R2242 Localized swelling, mass and lump, left lower limb: Secondary | ICD-10-CM | POA: Diagnosis not present

## 2014-06-19 ENCOUNTER — Ambulatory Visit
Admission: RE | Admit: 2014-06-19 | Discharge: 2014-06-19 | Disposition: A | Discharge status: Home / Self Care | Payer: Medicare Other | Source: Ambulatory Visit | Attending: General Surgery | Admitting: General Surgery

## 2014-06-19 ENCOUNTER — Other Ambulatory Visit (INDEPENDENT_AMBULATORY_CARE_PROVIDER_SITE_OTHER): Payer: Self-pay | Admitting: General Surgery

## 2014-06-19 DIAGNOSIS — R2242 Localized swelling, mass and lump, left lower limb: Secondary | ICD-10-CM | POA: Diagnosis not present

## 2014-06-19 MED ORDER — GADOBENATE DIMEGLUMINE 529 MG/ML IV SOLN
15.0000 mL | Freq: Once | INTRAVENOUS | Status: AC | PRN
  Administered 2014-06-19: 15 mL via INTRAVENOUS

## 2014-07-10 ENCOUNTER — Other Ambulatory Visit: Payer: Self-pay

## 2014-07-13 HISTORY — PX: TRIGGER FINGER RELEASE: SHX641

## 2015-03-13 ENCOUNTER — Other Ambulatory Visit: Payer: Self-pay | Admitting: Internal Medicine

## 2015-04-24 ENCOUNTER — Other Ambulatory Visit: Payer: Self-pay | Admitting: Internal Medicine

## 2015-07-17 DIAGNOSIS — H11423 Conjunctival edema, bilateral: Secondary | ICD-10-CM | POA: Diagnosis not present

## 2015-07-17 DIAGNOSIS — H3589 Other specified retinal disorders: Secondary | ICD-10-CM | POA: Diagnosis not present

## 2015-07-17 DIAGNOSIS — H40023 Open angle with borderline findings, high risk, bilateral: Secondary | ICD-10-CM | POA: Diagnosis not present

## 2015-07-17 DIAGNOSIS — H524 Presbyopia: Secondary | ICD-10-CM | POA: Diagnosis not present

## 2015-07-17 DIAGNOSIS — H35433 Paving stone degeneration of retina, bilateral: Secondary | ICD-10-CM | POA: Diagnosis not present

## 2015-07-17 DIAGNOSIS — H18413 Arcus senilis, bilateral: Secondary | ICD-10-CM | POA: Diagnosis not present

## 2015-07-17 DIAGNOSIS — H11153 Pinguecula, bilateral: Secondary | ICD-10-CM | POA: Diagnosis not present

## 2015-07-17 DIAGNOSIS — H52223 Regular astigmatism, bilateral: Secondary | ICD-10-CM | POA: Diagnosis not present

## 2015-07-17 DIAGNOSIS — H5203 Hypermetropia, bilateral: Secondary | ICD-10-CM | POA: Diagnosis not present

## 2015-07-17 DIAGNOSIS — H2513 Age-related nuclear cataract, bilateral: Secondary | ICD-10-CM | POA: Diagnosis not present

## 2015-09-05 DIAGNOSIS — H52223 Regular astigmatism, bilateral: Secondary | ICD-10-CM | POA: Diagnosis not present

## 2015-09-05 DIAGNOSIS — H40023 Open angle with borderline findings, high risk, bilateral: Secondary | ICD-10-CM | POA: Diagnosis not present

## 2015-09-05 DIAGNOSIS — H11423 Conjunctival edema, bilateral: Secondary | ICD-10-CM | POA: Diagnosis not present

## 2015-09-05 DIAGNOSIS — H524 Presbyopia: Secondary | ICD-10-CM | POA: Diagnosis not present

## 2015-09-05 DIAGNOSIS — H2513 Age-related nuclear cataract, bilateral: Secondary | ICD-10-CM | POA: Diagnosis not present

## 2015-09-05 DIAGNOSIS — H11153 Pinguecula, bilateral: Secondary | ICD-10-CM | POA: Diagnosis not present

## 2015-09-05 DIAGNOSIS — H18413 Arcus senilis, bilateral: Secondary | ICD-10-CM | POA: Diagnosis not present

## 2015-09-05 DIAGNOSIS — H5203 Hypermetropia, bilateral: Secondary | ICD-10-CM | POA: Diagnosis not present

## 2015-09-05 DIAGNOSIS — H35433 Paving stone degeneration of retina, bilateral: Secondary | ICD-10-CM | POA: Diagnosis not present

## 2015-09-05 DIAGNOSIS — H3589 Other specified retinal disorders: Secondary | ICD-10-CM | POA: Diagnosis not present

## 2015-09-23 DIAGNOSIS — H401132 Primary open-angle glaucoma, bilateral, moderate stage: Secondary | ICD-10-CM | POA: Diagnosis not present

## 2015-09-23 DIAGNOSIS — H2513 Age-related nuclear cataract, bilateral: Secondary | ICD-10-CM | POA: Diagnosis not present

## 2015-09-23 DIAGNOSIS — H43812 Vitreous degeneration, left eye: Secondary | ICD-10-CM | POA: Diagnosis not present

## 2015-09-24 DIAGNOSIS — M65341 Trigger finger, right ring finger: Secondary | ICD-10-CM | POA: Diagnosis not present

## 2015-10-08 DIAGNOSIS — H401132 Primary open-angle glaucoma, bilateral, moderate stage: Secondary | ICD-10-CM | POA: Diagnosis not present

## 2015-10-10 DIAGNOSIS — M65341 Trigger finger, right ring finger: Secondary | ICD-10-CM | POA: Diagnosis not present

## 2015-10-11 DIAGNOSIS — L570 Actinic keratosis: Secondary | ICD-10-CM | POA: Diagnosis not present

## 2015-10-11 DIAGNOSIS — D485 Neoplasm of uncertain behavior of skin: Secondary | ICD-10-CM | POA: Diagnosis not present

## 2015-10-11 DIAGNOSIS — Z23 Encounter for immunization: Secondary | ICD-10-CM | POA: Diagnosis not present

## 2015-10-18 DIAGNOSIS — H43812 Vitreous degeneration, left eye: Secondary | ICD-10-CM | POA: Diagnosis not present

## 2015-10-18 DIAGNOSIS — H2513 Age-related nuclear cataract, bilateral: Secondary | ICD-10-CM | POA: Diagnosis not present

## 2015-10-18 DIAGNOSIS — H43811 Vitreous degeneration, right eye: Secondary | ICD-10-CM | POA: Diagnosis not present

## 2015-10-23 DIAGNOSIS — H401122 Primary open-angle glaucoma, left eye, moderate stage: Secondary | ICD-10-CM | POA: Diagnosis not present

## 2015-12-06 DIAGNOSIS — H401132 Primary open-angle glaucoma, bilateral, moderate stage: Secondary | ICD-10-CM | POA: Diagnosis not present

## 2015-12-06 DIAGNOSIS — H43811 Vitreous degeneration, right eye: Secondary | ICD-10-CM | POA: Diagnosis not present

## 2015-12-06 DIAGNOSIS — H43812 Vitreous degeneration, left eye: Secondary | ICD-10-CM | POA: Diagnosis not present

## 2015-12-06 DIAGNOSIS — H2513 Age-related nuclear cataract, bilateral: Secondary | ICD-10-CM | POA: Diagnosis not present

## 2016-01-17 DIAGNOSIS — H43811 Vitreous degeneration, right eye: Secondary | ICD-10-CM | POA: Diagnosis not present

## 2016-01-17 DIAGNOSIS — H401132 Primary open-angle glaucoma, bilateral, moderate stage: Secondary | ICD-10-CM | POA: Diagnosis not present

## 2016-01-17 DIAGNOSIS — H43812 Vitreous degeneration, left eye: Secondary | ICD-10-CM | POA: Diagnosis not present

## 2016-01-17 DIAGNOSIS — H2513 Age-related nuclear cataract, bilateral: Secondary | ICD-10-CM | POA: Diagnosis not present

## 2016-02-25 DIAGNOSIS — N529 Male erectile dysfunction, unspecified: Secondary | ICD-10-CM | POA: Diagnosis not present

## 2016-02-25 DIAGNOSIS — I10 Essential (primary) hypertension: Secondary | ICD-10-CM | POA: Diagnosis not present

## 2016-02-25 DIAGNOSIS — E785 Hyperlipidemia, unspecified: Secondary | ICD-10-CM | POA: Diagnosis not present

## 2016-02-25 DIAGNOSIS — Z79899 Other long term (current) drug therapy: Secondary | ICD-10-CM | POA: Diagnosis not present

## 2016-02-26 DIAGNOSIS — F418 Other specified anxiety disorders: Secondary | ICD-10-CM | POA: Diagnosis not present

## 2016-02-26 DIAGNOSIS — N529 Male erectile dysfunction, unspecified: Secondary | ICD-10-CM | POA: Diagnosis not present

## 2016-02-26 DIAGNOSIS — Z79899 Other long term (current) drug therapy: Secondary | ICD-10-CM | POA: Diagnosis not present

## 2016-02-26 DIAGNOSIS — J309 Allergic rhinitis, unspecified: Secondary | ICD-10-CM | POA: Diagnosis not present

## 2016-02-26 DIAGNOSIS — Z0000 Encounter for general adult medical examination without abnormal findings: Secondary | ICD-10-CM | POA: Diagnosis not present

## 2016-02-26 DIAGNOSIS — E785 Hyperlipidemia, unspecified: Secondary | ICD-10-CM | POA: Diagnosis not present

## 2016-02-26 DIAGNOSIS — G4700 Insomnia, unspecified: Secondary | ICD-10-CM | POA: Diagnosis not present

## 2016-02-26 DIAGNOSIS — Z1389 Encounter for screening for other disorder: Secondary | ICD-10-CM | POA: Diagnosis not present

## 2016-02-26 DIAGNOSIS — I10 Essential (primary) hypertension: Secondary | ICD-10-CM | POA: Diagnosis not present

## 2016-02-26 DIAGNOSIS — R3129 Other microscopic hematuria: Secondary | ICD-10-CM | POA: Diagnosis not present

## 2016-02-26 DIAGNOSIS — M65341 Trigger finger, right ring finger: Secondary | ICD-10-CM | POA: Diagnosis not present

## 2016-05-28 DIAGNOSIS — H47233 Glaucomatous optic atrophy, bilateral: Secondary | ICD-10-CM | POA: Diagnosis not present

## 2016-05-28 DIAGNOSIS — H11153 Pinguecula, bilateral: Secondary | ICD-10-CM | POA: Diagnosis not present

## 2016-05-28 DIAGNOSIS — H11423 Conjunctival edema, bilateral: Secondary | ICD-10-CM | POA: Diagnosis not present

## 2016-05-28 DIAGNOSIS — H18413 Arcus senilis, bilateral: Secondary | ICD-10-CM | POA: Diagnosis not present

## 2016-05-28 DIAGNOSIS — H2513 Age-related nuclear cataract, bilateral: Secondary | ICD-10-CM | POA: Diagnosis not present

## 2016-09-01 DIAGNOSIS — H25013 Cortical age-related cataract, bilateral: Secondary | ICD-10-CM | POA: Diagnosis not present

## 2016-09-01 DIAGNOSIS — H47233 Glaucomatous optic atrophy, bilateral: Secondary | ICD-10-CM | POA: Diagnosis not present

## 2016-09-01 DIAGNOSIS — H18413 Arcus senilis, bilateral: Secondary | ICD-10-CM | POA: Diagnosis not present

## 2016-09-01 DIAGNOSIS — H11423 Conjunctival edema, bilateral: Secondary | ICD-10-CM | POA: Diagnosis not present

## 2016-09-01 DIAGNOSIS — H11153 Pinguecula, bilateral: Secondary | ICD-10-CM | POA: Diagnosis not present

## 2016-09-01 DIAGNOSIS — H401232 Low-tension glaucoma, bilateral, moderate stage: Secondary | ICD-10-CM | POA: Diagnosis not present

## 2016-09-01 DIAGNOSIS — H2513 Age-related nuclear cataract, bilateral: Secondary | ICD-10-CM | POA: Diagnosis not present

## 2016-10-29 DIAGNOSIS — H401132 Primary open-angle glaucoma, bilateral, moderate stage: Secondary | ICD-10-CM | POA: Diagnosis not present

## 2016-11-10 DIAGNOSIS — H401132 Primary open-angle glaucoma, bilateral, moderate stage: Secondary | ICD-10-CM | POA: Diagnosis not present

## 2016-11-25 ENCOUNTER — Other Ambulatory Visit: Payer: Self-pay | Admitting: Optometry

## 2016-11-25 DIAGNOSIS — H4720 Unspecified optic atrophy: Secondary | ICD-10-CM

## 2016-12-09 ENCOUNTER — Ambulatory Visit
Admission: RE | Admit: 2016-12-09 | Discharge: 2016-12-09 | Disposition: A | Discharge status: Home / Self Care | Payer: PPO | Source: Ambulatory Visit | Attending: Optometry | Admitting: Optometry

## 2016-12-09 DIAGNOSIS — H4720 Unspecified optic atrophy: Secondary | ICD-10-CM

## 2016-12-09 DIAGNOSIS — H409 Unspecified glaucoma: Secondary | ICD-10-CM | POA: Diagnosis not present

## 2016-12-09 MED ORDER — GADOBENATE DIMEGLUMINE 529 MG/ML IV SOLN
20.0000 mL | Freq: Once | INTRAVENOUS | Status: AC | PRN
  Administered 2016-12-09: 16 mL via INTRAVENOUS

## 2017-02-26 DIAGNOSIS — Z79899 Other long term (current) drug therapy: Secondary | ICD-10-CM | POA: Diagnosis not present

## 2017-02-26 DIAGNOSIS — G4700 Insomnia, unspecified: Secondary | ICD-10-CM | POA: Diagnosis not present

## 2017-02-26 DIAGNOSIS — R3121 Asymptomatic microscopic hematuria: Secondary | ICD-10-CM | POA: Diagnosis not present

## 2017-02-26 DIAGNOSIS — J309 Allergic rhinitis, unspecified: Secondary | ICD-10-CM | POA: Diagnosis not present

## 2017-02-26 DIAGNOSIS — I10 Essential (primary) hypertension: Secondary | ICD-10-CM | POA: Diagnosis not present

## 2017-02-26 DIAGNOSIS — N529 Male erectile dysfunction, unspecified: Secondary | ICD-10-CM | POA: Diagnosis not present

## 2017-02-26 DIAGNOSIS — F418 Other specified anxiety disorders: Secondary | ICD-10-CM | POA: Diagnosis not present

## 2017-02-26 DIAGNOSIS — Z0001 Encounter for general adult medical examination with abnormal findings: Secondary | ICD-10-CM | POA: Diagnosis not present

## 2017-02-26 DIAGNOSIS — E785 Hyperlipidemia, unspecified: Secondary | ICD-10-CM | POA: Diagnosis not present

## 2017-02-26 DIAGNOSIS — Z1389 Encounter for screening for other disorder: Secondary | ICD-10-CM | POA: Diagnosis not present

## 2017-02-26 DIAGNOSIS — F99 Mental disorder, not otherwise specified: Secondary | ICD-10-CM | POA: Diagnosis not present

## 2017-04-15 DIAGNOSIS — H04123 Dry eye syndrome of bilateral lacrimal glands: Secondary | ICD-10-CM | POA: Diagnosis not present

## 2017-04-15 DIAGNOSIS — H401132 Primary open-angle glaucoma, bilateral, moderate stage: Secondary | ICD-10-CM | POA: Diagnosis not present

## 2017-04-15 DIAGNOSIS — H2513 Age-related nuclear cataract, bilateral: Secondary | ICD-10-CM | POA: Diagnosis not present

## 2017-04-30 DIAGNOSIS — H16223 Keratoconjunctivitis sicca, not specified as Sjogren's, bilateral: Secondary | ICD-10-CM | POA: Diagnosis not present

## 2017-04-30 DIAGNOSIS — H43811 Vitreous degeneration, right eye: Secondary | ICD-10-CM | POA: Diagnosis not present

## 2017-04-30 DIAGNOSIS — H43812 Vitreous degeneration, left eye: Secondary | ICD-10-CM | POA: Diagnosis not present

## 2017-04-30 DIAGNOSIS — H401132 Primary open-angle glaucoma, bilateral, moderate stage: Secondary | ICD-10-CM | POA: Diagnosis not present

## 2017-04-30 DIAGNOSIS — H2513 Age-related nuclear cataract, bilateral: Secondary | ICD-10-CM | POA: Diagnosis not present

## 2017-06-22 DIAGNOSIS — M549 Dorsalgia, unspecified: Secondary | ICD-10-CM | POA: Diagnosis not present

## 2017-06-22 DIAGNOSIS — R61 Generalized hyperhidrosis: Secondary | ICD-10-CM | POA: Diagnosis not present

## 2017-07-12 DIAGNOSIS — I10 Essential (primary) hypertension: Secondary | ICD-10-CM | POA: Diagnosis not present

## 2017-07-12 DIAGNOSIS — M549 Dorsalgia, unspecified: Secondary | ICD-10-CM | POA: Diagnosis not present

## 2017-08-31 ENCOUNTER — Ambulatory Visit
Admission: RE | Admit: 2017-08-31 | Discharge: 2017-08-31 | Disposition: A | Discharge status: Home / Self Care | Payer: PPO | Source: Ambulatory Visit | Attending: Internal Medicine | Admitting: Internal Medicine

## 2017-08-31 ENCOUNTER — Other Ambulatory Visit: Payer: Self-pay | Admitting: Internal Medicine

## 2017-08-31 DIAGNOSIS — R0609 Other forms of dyspnea: Secondary | ICD-10-CM

## 2017-08-31 DIAGNOSIS — Z87891 Personal history of nicotine dependence: Secondary | ICD-10-CM | POA: Diagnosis not present

## 2017-08-31 DIAGNOSIS — M5414 Radiculopathy, thoracic region: Secondary | ICD-10-CM

## 2017-08-31 DIAGNOSIS — R0789 Other chest pain: Secondary | ICD-10-CM | POA: Diagnosis not present

## 2017-08-31 DIAGNOSIS — R0602 Shortness of breath: Secondary | ICD-10-CM | POA: Diagnosis not present

## 2017-08-31 DIAGNOSIS — M47814 Spondylosis without myelopathy or radiculopathy, thoracic region: Secondary | ICD-10-CM | POA: Diagnosis not present

## 2017-08-31 DIAGNOSIS — R5383 Other fatigue: Secondary | ICD-10-CM | POA: Diagnosis not present

## 2017-08-31 DIAGNOSIS — R202 Paresthesia of skin: Secondary | ICD-10-CM | POA: Diagnosis not present

## 2017-09-01 DIAGNOSIS — H43812 Vitreous degeneration, left eye: Secondary | ICD-10-CM | POA: Diagnosis not present

## 2017-09-01 DIAGNOSIS — H401132 Primary open-angle glaucoma, bilateral, moderate stage: Secondary | ICD-10-CM | POA: Diagnosis not present

## 2017-09-01 DIAGNOSIS — H2513 Age-related nuclear cataract, bilateral: Secondary | ICD-10-CM | POA: Diagnosis not present

## 2017-09-01 DIAGNOSIS — H43811 Vitreous degeneration, right eye: Secondary | ICD-10-CM | POA: Diagnosis not present

## 2017-09-02 ENCOUNTER — Other Ambulatory Visit: Payer: Self-pay | Admitting: Internal Medicine

## 2017-09-02 DIAGNOSIS — R0609 Other forms of dyspnea: Principal | ICD-10-CM

## 2017-09-03 ENCOUNTER — Ambulatory Visit (INDEPENDENT_AMBULATORY_CARE_PROVIDER_SITE_OTHER): Payer: PPO

## 2017-09-03 DIAGNOSIS — R0609 Other forms of dyspnea: Secondary | ICD-10-CM | POA: Diagnosis not present

## 2017-09-03 LAB — EXERCISE TOLERANCE TEST
CHL CUP RESTING HR STRESS: 63 {beats}/min
CSEPEW: 10.1 METS
CSEPPHR: 150 {beats}/min
Exercise duration (min): 9 min
Exercise duration (sec): 0 s
MPHR: 150 {beats}/min
Percent HR: 100 %
RPE: 16

## 2017-12-27 DIAGNOSIS — H43811 Vitreous degeneration, right eye: Secondary | ICD-10-CM | POA: Diagnosis not present

## 2017-12-27 DIAGNOSIS — H401132 Primary open-angle glaucoma, bilateral, moderate stage: Secondary | ICD-10-CM | POA: Diagnosis not present

## 2017-12-27 DIAGNOSIS — H43812 Vitreous degeneration, left eye: Secondary | ICD-10-CM | POA: Diagnosis not present

## 2017-12-27 DIAGNOSIS — H2513 Age-related nuclear cataract, bilateral: Secondary | ICD-10-CM | POA: Diagnosis not present

## 2018-02-25 DIAGNOSIS — R3129 Other microscopic hematuria: Secondary | ICD-10-CM | POA: Diagnosis not present

## 2018-02-25 DIAGNOSIS — E785 Hyperlipidemia, unspecified: Secondary | ICD-10-CM | POA: Diagnosis not present

## 2018-02-25 DIAGNOSIS — I10 Essential (primary) hypertension: Secondary | ICD-10-CM | POA: Diagnosis not present

## 2018-02-25 DIAGNOSIS — Z79899 Other long term (current) drug therapy: Secondary | ICD-10-CM | POA: Diagnosis not present

## 2018-02-28 DIAGNOSIS — Z1211 Encounter for screening for malignant neoplasm of colon: Secondary | ICD-10-CM | POA: Diagnosis not present

## 2018-02-28 DIAGNOSIS — K648 Other hemorrhoids: Secondary | ICD-10-CM | POA: Diagnosis not present

## 2018-03-10 DIAGNOSIS — Z1389 Encounter for screening for other disorder: Secondary | ICD-10-CM | POA: Diagnosis not present

## 2018-03-10 DIAGNOSIS — E785 Hyperlipidemia, unspecified: Secondary | ICD-10-CM | POA: Diagnosis not present

## 2018-03-10 DIAGNOSIS — R3129 Other microscopic hematuria: Secondary | ICD-10-CM | POA: Diagnosis not present

## 2018-03-10 DIAGNOSIS — Z0001 Encounter for general adult medical examination with abnormal findings: Secondary | ICD-10-CM | POA: Diagnosis not present

## 2018-03-10 DIAGNOSIS — I10 Essential (primary) hypertension: Secondary | ICD-10-CM | POA: Diagnosis not present

## 2018-03-10 DIAGNOSIS — Z79899 Other long term (current) drug therapy: Secondary | ICD-10-CM | POA: Diagnosis not present

## 2018-03-10 DIAGNOSIS — H409 Unspecified glaucoma: Secondary | ICD-10-CM | POA: Diagnosis not present

## 2018-03-10 DIAGNOSIS — R5383 Other fatigue: Secondary | ICD-10-CM | POA: Diagnosis not present

## 2018-05-16 DIAGNOSIS — H43811 Vitreous degeneration, right eye: Secondary | ICD-10-CM | POA: Diagnosis not present

## 2018-05-16 DIAGNOSIS — H401132 Primary open-angle glaucoma, bilateral, moderate stage: Secondary | ICD-10-CM | POA: Diagnosis not present

## 2018-05-16 DIAGNOSIS — H43812 Vitreous degeneration, left eye: Secondary | ICD-10-CM | POA: Diagnosis not present

## 2018-05-16 DIAGNOSIS — H2513 Age-related nuclear cataract, bilateral: Secondary | ICD-10-CM | POA: Diagnosis not present

## 2018-06-19 IMAGING — MR MR ORBITS WO/W CM
12 of 16 series · 28 of 48 positions shown · IV contrast (16ml Multihance)
Comparison: None.

CLINICAL DATA: Optic atrophy, glaucoma with early central vision
loss for over year. Assess for mass.

EXAM:
MRI HEAD AND ORBITS WITHOUT AND WITH CONTRAST
TECHNIQUE: Multiplanar, multiecho pulse sequences of the brain and surrounding
structures were obtained without and with intravenous contrast.
Multiplanar, multiecho pulse sequences of the orbits and surrounding
structures were obtained including fat saturation techniques, before
and after intravenous contrast administration.
CONTRAST:  16mL MULTIHANCE GADOBENATE DIMEGLUMINE 529 MG/ML IV SOLN

[Series 2: T1 · sagittal · 5.0mm · 0.45mm/px · 2 of 20 slices shown]
[im 1/20]
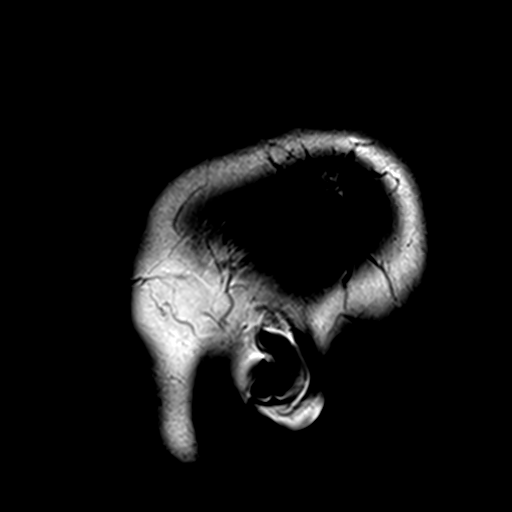
[im 20/20]
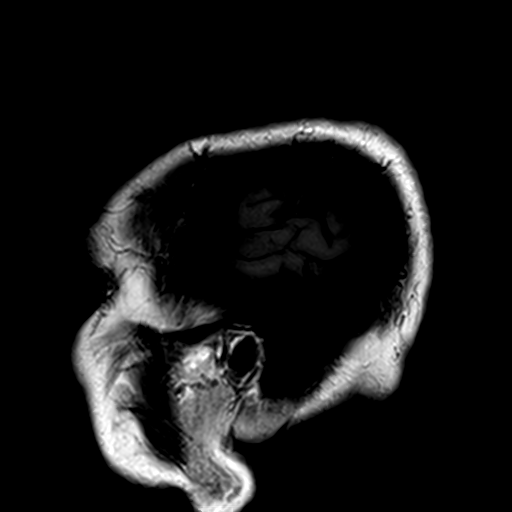

[Series 3: ep2d_diff_(id)_trace · axial · 5.0mm · 1.80mm/px · z∈[-22,+114]mm · 4 of 44 slices shown]
[im 1/44]
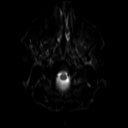
[im 15/44]
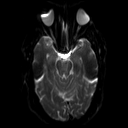
[im 29/44]
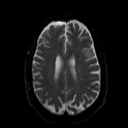
[im 44/44]
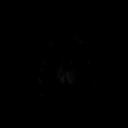

[Series 4: ep2d_diff_(id)_trace_adc · axial · 5.0mm · 1.80mm/px · z∈[-22,+114]mm · 2 of 21 slices shown]
[im 1/21]
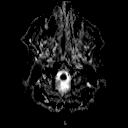
[im 21/21]
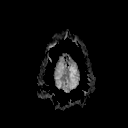

[Series 5: t2_tse_tra · axial · 5.0mm · 0.72mm/px · z∈[-25,+111]mm · 2 of 22 slices shown]
[im 1/22]
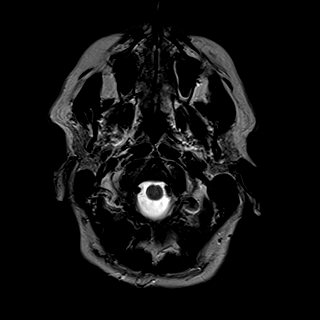
[im 22/22]
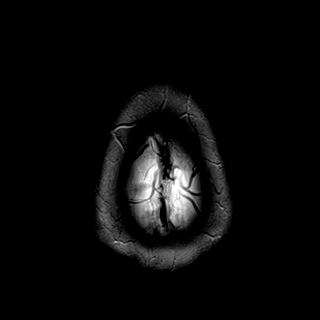

[Series 6: FLAIR · axial · 5.0mm · 0.45mm/px · z∈[-25,+111]mm · 2 of 22 slices shown]
[im 1/22]
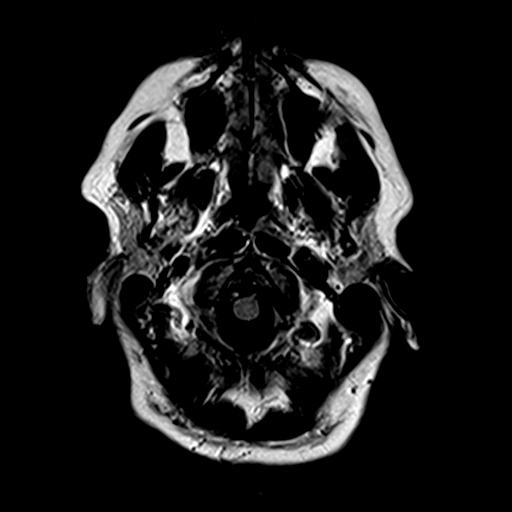
[im 22/22]
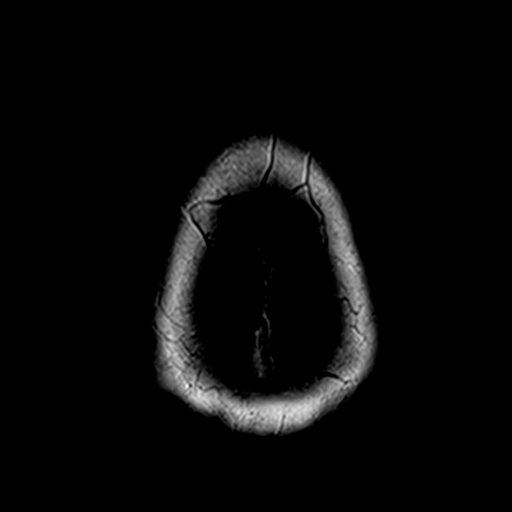

[Series 7: t1_tse cor_3mm · coronal · 3.0mm · 0.35mm/px · 2 of 24 slices shown]
[im 1/24]
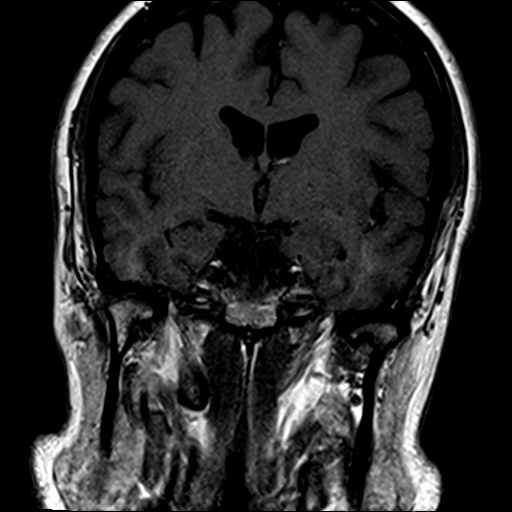
[im 24/24]
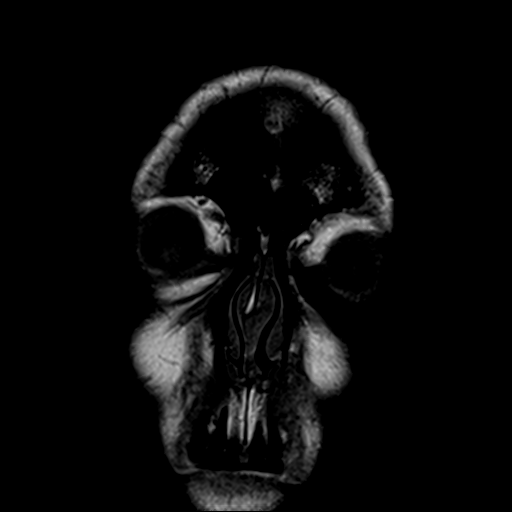

[Series 8: t1_tse axial_3mm · axial · 3.0mm · 0.35mm/px · z∈[+1,+57]mm · 2 of 19 slices shown]
[im 1/19]
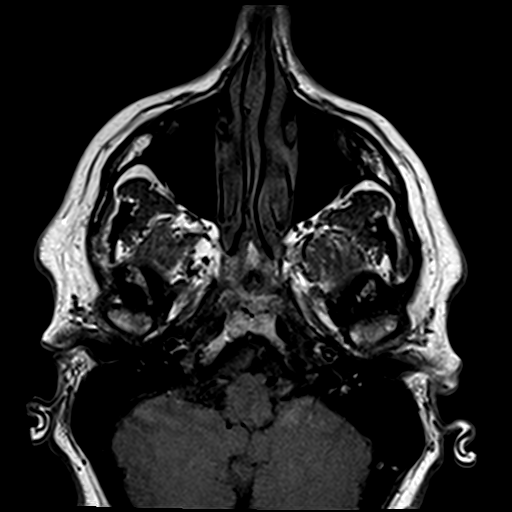
[im 19/19]
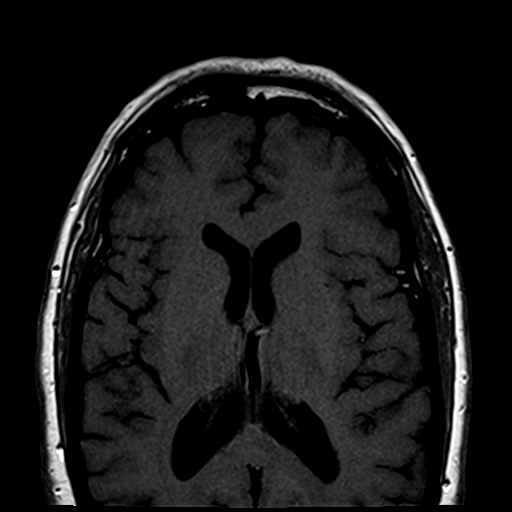

[Series 9: t2_cor_fs_3mm orbits · coronal · 3.0mm · 0.70mm/px · 2 of 24 slices shown]
[im 1/24]
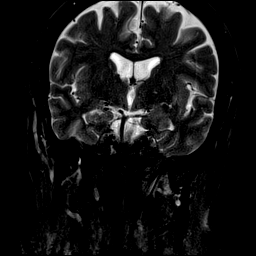
[im 24/24]
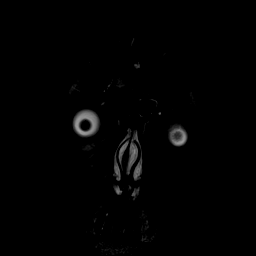

[Series 10: t2_ax_fs_3mm orbits · axial · 3.0mm · 0.35mm/px · z∈[+1,+57]mm · 2 of 19 slices shown]
[im 1/19]
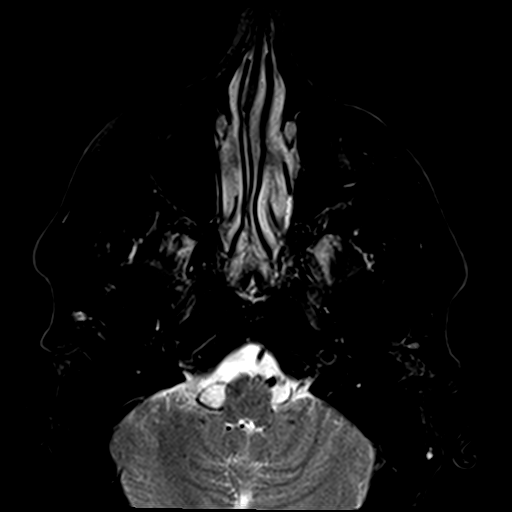
[im 19/19]
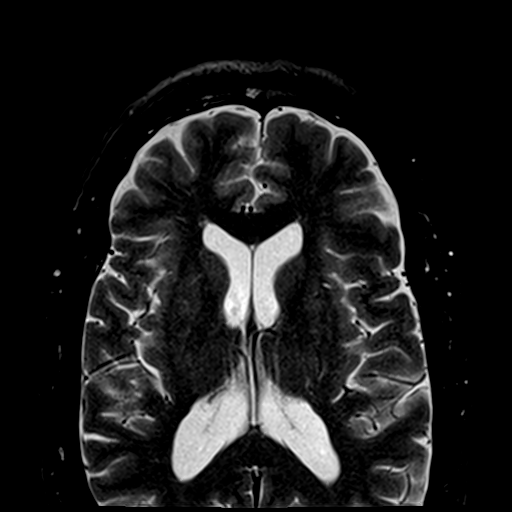

[Series 11: GRE · axial · 5.0mm · 0.45mm/px · z∈[-27,+109]mm · 2 of 22 slices shown]
[im 1/22]
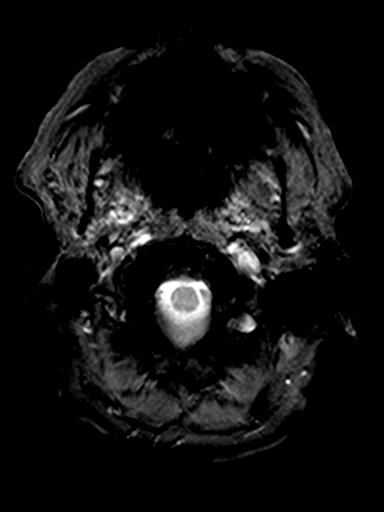
[im 22/22]
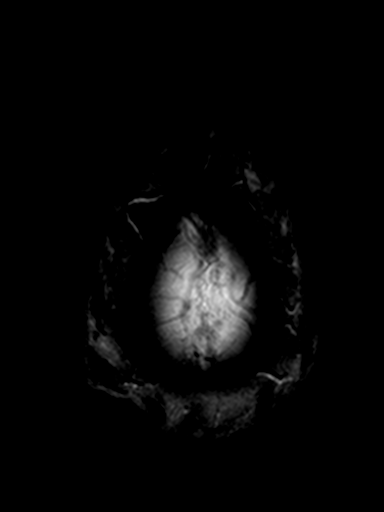

[Series 12: t1_mpr_tra · axial · 2.0mm · 0.45mm/px · z∈[-36,+8]mm · 3 of 80 slices shown]
[im 1/80]
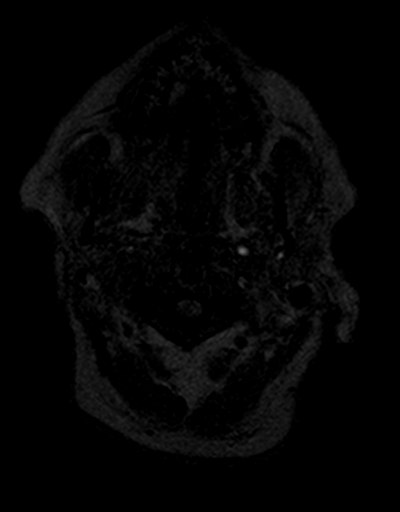
[im 12/80]
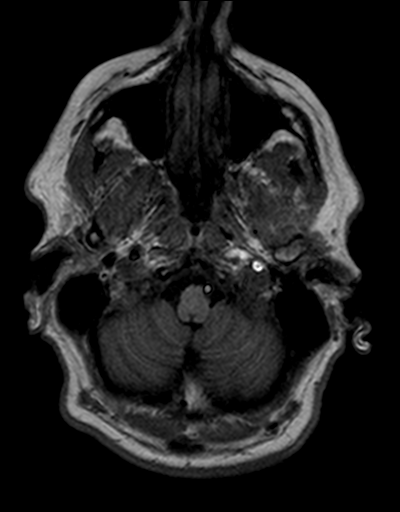
[im 23/80]
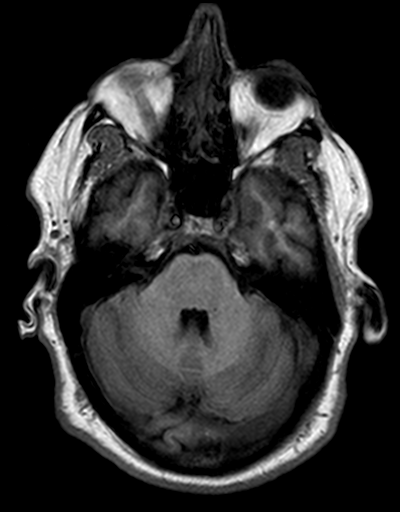

[Series 13: T2 · coronal · non-contrast · 5.0mm · 0.45mm/px · 3 of 29 slices shown]
[im 1/29]
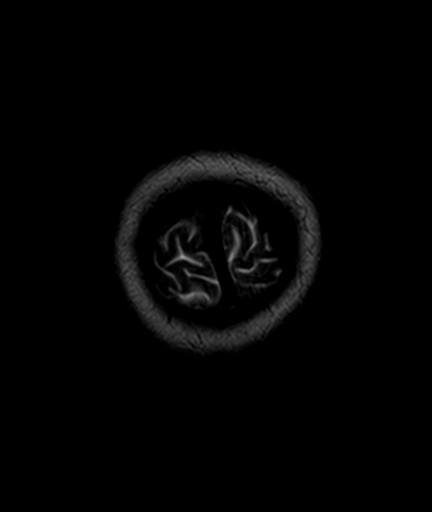
[im 15/29]
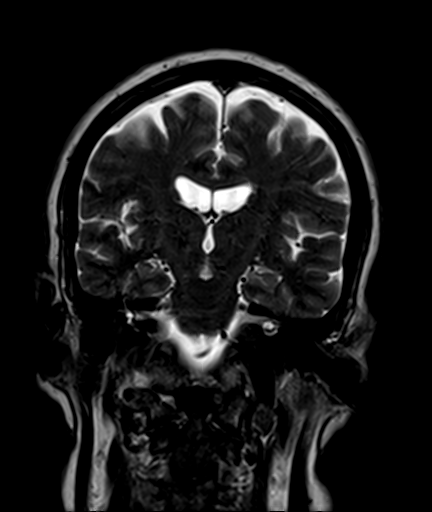
[im 29/29]
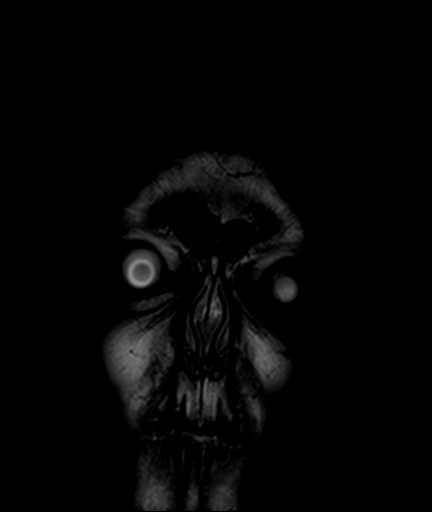

[28 of 48 positions shown; findings below may reference images not displayed]

FINDINGS: MRI HEAD FINDINGS

INTRACRANIAL CONTENTS: No reduced diffusion to suggest acute
ischemia. No susceptibility artifact to suggest hemorrhage. The
ventricles and sulci are normal for patient's age. No suspicious
parenchymal signal, masses, mass effect. No abnormal
intraparenchymal or extra-axial enhancement. No abnormal extra-axial
fluid collections. No extra-axial masses.

VASCULAR: Normal major intracranial vascular flow voids present at
skull base.

SKULL AND UPPER CERVICAL SPINE: No abnormal sellar expansion. No
suspicious calvarial bone marrow signal. Subcentimeter frontal
enhancing probable hemangioma. Craniocervical junction maintained.

OTHER: None.

MRI ORBITS FINDINGS

ORBITS: Ocular globes are intact with normal signal. Lenses are
located. Preservation of the orbital fat. Normal appearance of the
optic nerves and nerve sheaths. Normal symmetric appearance of the
extraocular muscles. No intra-ocular mass, signal abnormality nor
abnormal enhancement. Superior ophthalmic veins are not enlarged.

VISUALIZED SINUSES: Trace paranasal sinus mucosal thickening without
air-fluid levels. Mastoid air cells are well aerated.

SOFT TISSUES: Normal.
IMPRESSION: Negative MRI of the head with and without contrast for age.

Negative MRI of the orbits with and without contrast.

## 2018-06-27 DIAGNOSIS — H2513 Age-related nuclear cataract, bilateral: Secondary | ICD-10-CM | POA: Diagnosis not present

## 2018-06-27 DIAGNOSIS — H401132 Primary open-angle glaucoma, bilateral, moderate stage: Secondary | ICD-10-CM | POA: Diagnosis not present

## 2018-06-27 DIAGNOSIS — H43811 Vitreous degeneration, right eye: Secondary | ICD-10-CM | POA: Diagnosis not present

## 2018-06-27 DIAGNOSIS — H43812 Vitreous degeneration, left eye: Secondary | ICD-10-CM | POA: Diagnosis not present

## 2019-02-08 DIAGNOSIS — B351 Tinea unguium: Secondary | ICD-10-CM | POA: Diagnosis not present

## 2019-02-08 DIAGNOSIS — B359 Dermatophytosis, unspecified: Secondary | ICD-10-CM | POA: Diagnosis not present

## 2019-02-08 DIAGNOSIS — L309 Dermatitis, unspecified: Secondary | ICD-10-CM | POA: Diagnosis not present

## 2019-03-06 DIAGNOSIS — H43813 Vitreous degeneration, bilateral: Secondary | ICD-10-CM | POA: Diagnosis not present

## 2019-03-06 DIAGNOSIS — H04123 Dry eye syndrome of bilateral lacrimal glands: Secondary | ICD-10-CM | POA: Diagnosis not present

## 2019-03-06 DIAGNOSIS — H401130 Primary open-angle glaucoma, bilateral, stage unspecified: Secondary | ICD-10-CM | POA: Diagnosis not present

## 2019-03-06 DIAGNOSIS — H2513 Age-related nuclear cataract, bilateral: Secondary | ICD-10-CM | POA: Diagnosis not present

## 2019-04-10 DIAGNOSIS — E785 Hyperlipidemia, unspecified: Secondary | ICD-10-CM | POA: Diagnosis not present

## 2019-04-10 DIAGNOSIS — I10 Essential (primary) hypertension: Secondary | ICD-10-CM | POA: Diagnosis not present

## 2019-07-04 DIAGNOSIS — H2513 Age-related nuclear cataract, bilateral: Secondary | ICD-10-CM | POA: Diagnosis not present

## 2019-07-04 DIAGNOSIS — H43813 Vitreous degeneration, bilateral: Secondary | ICD-10-CM | POA: Diagnosis not present

## 2019-07-04 DIAGNOSIS — H401131 Primary open-angle glaucoma, bilateral, mild stage: Secondary | ICD-10-CM | POA: Diagnosis not present

## 2019-07-04 DIAGNOSIS — H04123 Dry eye syndrome of bilateral lacrimal glands: Secondary | ICD-10-CM | POA: Diagnosis not present

## 2019-11-17 DIAGNOSIS — H43813 Vitreous degeneration, bilateral: Secondary | ICD-10-CM | POA: Diagnosis not present

## 2019-11-17 DIAGNOSIS — H401131 Primary open-angle glaucoma, bilateral, mild stage: Secondary | ICD-10-CM | POA: Diagnosis not present

## 2019-11-17 DIAGNOSIS — H04123 Dry eye syndrome of bilateral lacrimal glands: Secondary | ICD-10-CM | POA: Diagnosis not present

## 2019-11-17 DIAGNOSIS — H2513 Age-related nuclear cataract, bilateral: Secondary | ICD-10-CM | POA: Diagnosis not present

## 2020-03-20 DIAGNOSIS — H43813 Vitreous degeneration, bilateral: Secondary | ICD-10-CM | POA: Diagnosis not present

## 2020-03-20 DIAGNOSIS — H04123 Dry eye syndrome of bilateral lacrimal glands: Secondary | ICD-10-CM | POA: Diagnosis not present

## 2020-03-20 DIAGNOSIS — H2513 Age-related nuclear cataract, bilateral: Secondary | ICD-10-CM | POA: Diagnosis not present

## 2020-03-20 DIAGNOSIS — H401132 Primary open-angle glaucoma, bilateral, moderate stage: Secondary | ICD-10-CM | POA: Diagnosis not present

## 2020-05-20 ENCOUNTER — Other Ambulatory Visit: Payer: Self-pay | Admitting: Internal Medicine

## 2020-05-20 DIAGNOSIS — R5383 Other fatigue: Secondary | ICD-10-CM | POA: Diagnosis not present

## 2020-05-20 DIAGNOSIS — Z87891 Personal history of nicotine dependence: Secondary | ICD-10-CM | POA: Diagnosis not present

## 2020-05-20 DIAGNOSIS — H409 Unspecified glaucoma: Secondary | ICD-10-CM | POA: Diagnosis not present

## 2020-05-20 DIAGNOSIS — Z1211 Encounter for screening for malignant neoplasm of colon: Secondary | ICD-10-CM | POA: Diagnosis not present

## 2020-05-20 DIAGNOSIS — Z79899 Other long term (current) drug therapy: Secondary | ICD-10-CM | POA: Diagnosis not present

## 2020-05-20 DIAGNOSIS — E785 Hyperlipidemia, unspecified: Secondary | ICD-10-CM | POA: Diagnosis not present

## 2020-05-20 DIAGNOSIS — Z0001 Encounter for general adult medical examination with abnormal findings: Secondary | ICD-10-CM | POA: Diagnosis not present

## 2020-05-20 DIAGNOSIS — R3129 Other microscopic hematuria: Secondary | ICD-10-CM | POA: Diagnosis not present

## 2020-05-20 DIAGNOSIS — I10 Essential (primary) hypertension: Secondary | ICD-10-CM | POA: Diagnosis not present

## 2020-05-31 DIAGNOSIS — R209 Unspecified disturbances of skin sensation: Secondary | ICD-10-CM | POA: Diagnosis not present

## 2020-06-10 ENCOUNTER — Ambulatory Visit
Admission: RE | Admit: 2020-06-10 | Discharge: 2020-06-10 | Disposition: A | Discharge status: Home / Self Care | Payer: PPO | Source: Ambulatory Visit | Attending: Internal Medicine | Admitting: Internal Medicine

## 2020-06-10 DIAGNOSIS — I700 Atherosclerosis of aorta: Secondary | ICD-10-CM | POA: Diagnosis not present

## 2020-06-10 DIAGNOSIS — R918 Other nonspecific abnormal finding of lung field: Secondary | ICD-10-CM | POA: Diagnosis not present

## 2020-06-10 DIAGNOSIS — Z87891 Personal history of nicotine dependence: Secondary | ICD-10-CM

## 2020-06-10 DIAGNOSIS — J8410 Pulmonary fibrosis, unspecified: Secondary | ICD-10-CM | POA: Diagnosis not present

## 2020-06-10 DIAGNOSIS — M2578 Osteophyte, vertebrae: Secondary | ICD-10-CM | POA: Diagnosis not present

## 2020-06-25 DIAGNOSIS — T148XXA Other injury of unspecified body region, initial encounter: Secondary | ICD-10-CM | POA: Diagnosis not present

## 2020-07-22 DIAGNOSIS — H2513 Age-related nuclear cataract, bilateral: Secondary | ICD-10-CM | POA: Diagnosis not present

## 2020-07-22 DIAGNOSIS — H401132 Primary open-angle glaucoma, bilateral, moderate stage: Secondary | ICD-10-CM | POA: Diagnosis not present

## 2020-07-22 DIAGNOSIS — H04123 Dry eye syndrome of bilateral lacrimal glands: Secondary | ICD-10-CM | POA: Diagnosis not present

## 2020-07-22 DIAGNOSIS — H43813 Vitreous degeneration, bilateral: Secondary | ICD-10-CM | POA: Diagnosis not present

## 2020-11-26 DIAGNOSIS — H401132 Primary open-angle glaucoma, bilateral, moderate stage: Secondary | ICD-10-CM | POA: Diagnosis not present

## 2020-11-26 DIAGNOSIS — H2513 Age-related nuclear cataract, bilateral: Secondary | ICD-10-CM | POA: Diagnosis not present

## 2020-11-26 DIAGNOSIS — H04123 Dry eye syndrome of bilateral lacrimal glands: Secondary | ICD-10-CM | POA: Diagnosis not present

## 2020-11-26 DIAGNOSIS — H43813 Vitreous degeneration, bilateral: Secondary | ICD-10-CM | POA: Diagnosis not present

## 2021-02-26 ENCOUNTER — Other Ambulatory Visit (HOSPITAL_COMMUNITY): Payer: Self-pay

## 2021-02-26 MED ORDER — CARESTART COVID-19 HOME TEST VI KIT
PACK | 0 refills | Status: DC
  Filled 2021-02-26: qty 4, 4d supply, fill #0

## 2021-03-06 ENCOUNTER — Other Ambulatory Visit (HOSPITAL_COMMUNITY): Payer: Self-pay

## 2021-03-06 MED ORDER — NIRMATRELVIR/RITONAVIR (PAXLOVID)TABLET
ORAL_TABLET | ORAL | 0 refills | Status: DC
Start: 2021-03-06 — End: 2021-08-14
  Filled 2021-03-06: qty 30, 5d supply, fill #0

## 2021-03-25 DIAGNOSIS — H04123 Dry eye syndrome of bilateral lacrimal glands: Secondary | ICD-10-CM | POA: Diagnosis not present

## 2021-03-25 DIAGNOSIS — H401132 Primary open-angle glaucoma, bilateral, moderate stage: Secondary | ICD-10-CM | POA: Diagnosis not present

## 2021-03-25 DIAGNOSIS — H2513 Age-related nuclear cataract, bilateral: Secondary | ICD-10-CM | POA: Diagnosis not present

## 2021-03-25 DIAGNOSIS — H43813 Vitreous degeneration, bilateral: Secondary | ICD-10-CM | POA: Diagnosis not present

## 2021-05-27 DIAGNOSIS — H409 Unspecified glaucoma: Secondary | ICD-10-CM | POA: Diagnosis not present

## 2021-05-27 DIAGNOSIS — E785 Hyperlipidemia, unspecified: Secondary | ICD-10-CM | POA: Diagnosis not present

## 2021-05-27 DIAGNOSIS — Z1211 Encounter for screening for malignant neoplasm of colon: Secondary | ICD-10-CM | POA: Diagnosis not present

## 2021-05-27 DIAGNOSIS — Z0001 Encounter for general adult medical examination with abnormal findings: Secondary | ICD-10-CM | POA: Diagnosis not present

## 2021-05-27 DIAGNOSIS — Z79899 Other long term (current) drug therapy: Secondary | ICD-10-CM | POA: Diagnosis not present

## 2021-05-27 DIAGNOSIS — R3129 Other microscopic hematuria: Secondary | ICD-10-CM | POA: Diagnosis not present

## 2021-05-27 DIAGNOSIS — Z87891 Personal history of nicotine dependence: Secondary | ICD-10-CM | POA: Diagnosis not present

## 2021-05-27 DIAGNOSIS — R5383 Other fatigue: Secondary | ICD-10-CM | POA: Diagnosis not present

## 2021-05-27 DIAGNOSIS — I10 Essential (primary) hypertension: Secondary | ICD-10-CM | POA: Diagnosis not present

## 2021-05-27 DIAGNOSIS — R209 Unspecified disturbances of skin sensation: Secondary | ICD-10-CM | POA: Diagnosis not present

## 2021-07-17 ENCOUNTER — Telehealth: Payer: Self-pay

## 2021-07-29 DIAGNOSIS — H401132 Primary open-angle glaucoma, bilateral, moderate stage: Secondary | ICD-10-CM | POA: Diagnosis not present

## 2021-07-29 DIAGNOSIS — H2513 Age-related nuclear cataract, bilateral: Secondary | ICD-10-CM | POA: Diagnosis not present

## 2021-07-29 DIAGNOSIS — H04123 Dry eye syndrome of bilateral lacrimal glands: Secondary | ICD-10-CM | POA: Diagnosis not present

## 2021-07-29 DIAGNOSIS — H43813 Vitreous degeneration, bilateral: Secondary | ICD-10-CM | POA: Diagnosis not present

## 2021-08-11 ENCOUNTER — Encounter: Payer: Self-pay | Admitting: Internal Medicine

## 2021-08-14 DIAGNOSIS — N400 Enlarged prostate without lower urinary tract symptoms: Secondary | ICD-10-CM | POA: Insufficient documentation

## 2021-08-14 DIAGNOSIS — H409 Unspecified glaucoma: Secondary | ICD-10-CM | POA: Insufficient documentation

## 2021-08-14 DIAGNOSIS — E785 Hyperlipidemia, unspecified: Secondary | ICD-10-CM | POA: Insufficient documentation

## 2021-08-14 DIAGNOSIS — I10 Essential (primary) hypertension: Secondary | ICD-10-CM | POA: Insufficient documentation

## 2021-08-14 DIAGNOSIS — G4700 Insomnia, unspecified: Secondary | ICD-10-CM | POA: Insufficient documentation

## 2021-08-14 DIAGNOSIS — N529 Male erectile dysfunction, unspecified: Secondary | ICD-10-CM | POA: Insufficient documentation

## 2021-08-14 NOTE — Progress Notes (Signed)
Subjective:    Patient ID: Brandon Watts, male    DOB: [birth year], 76 y.o.   MRN: [id number]  This visit occurred during the SARS-CoV-2 public health emergency.  Safety protocols were in place, including screening questions prior to the visit, additional usage of staff PPE, and extensive cleaning of exam room while observing appropriate contact time as indicated for disinfecting solutions.     HPI He is here to establish with a new pcp.   The patient is here for follow up of their chronic medical problems, including hypertension, BPH, hyperlipidemia   Central vision loss - glaucoma  Lipotripsy  microscopic hema - bx x 3 neg  He is exercising regularly.   He Swims, bikes, walking    Medications and allergies reviewed with patient and updated if appropriate.  Patient Active Problem List   Diagnosis Date Noted   Glaucoma 08/14/2021   Insomnia 08/14/2021   Hyperlipoproteinemia 08/14/2021   BPH (benign prostatic hyperplasia) 08/14/2021   Benign essential HTN 08/14/2021   Erectile dysfunction 08/14/2021   Trigger finger, acquired 07/11/2013   Overweight (BMI 25.0-29.9) 10/26/2012   S/P right THA, AA 10/25/2012   BACK PAIN, LUMBAR 01/22/2010   NONSPEC REACT TUBERCULIN SKN TEST W/O ACTV TB 06/13/2009   NEPHROLITHIASIS, HX OF 05/05/2007   HEPATITIS C 02/12/2007   ALLERGIC RHINITIS 02/12/2007   COLONIC POLYPS, HX OF 02/12/2007    Current Outpatient Medications on File Prior to Visit  Medication Sig Dispense Refill   ALPRAZolam (XANAX) 0.25 MG tablet Take 0.25 mg by mouth once.     bimatoprost (LUMIGAN) 0.01 % SOLN 1 drop into both eyes     celecoxib (CELEBREX) 200 MG capsule TAKE 1 CAPSULE DAILY. 30 capsule 11   dorzolamide-timolol (COSOPT) 22.3-6.8 MG/ML ophthalmic solution 1 drop into affected eye     finasteride (PROSCAR) 5 MG tablet Take 5 mg by mouth daily.     fluticasone (FLONASE) 50 MCG/ACT nasal spray 2 sprays     losartan-hydrochlorothiazide (HYZAAR) 50-12.5  MG tablet Take 1 tablet by mouth daily.     lovastatin (MEVACOR) 20 MG tablet Take 20 mg by mouth daily.     Timolol-Brimonidine-Dorzolamid 0.5-0.15-2 % SOLN      zaleplon (SONATA) 10 MG capsule Take 10 mg by mouth as needed (sleep).     fluticasone (FLONASE) 50 MCG/ACT nasal spray Place 2 sprays into the nose daily. 16 g 6   ketoconazole (NIZORAL) 2 % shampoo      No current facility-administered medications on file prior to visit.    Past Medical History:  Diagnosis Date   Allergic rhinitis    Arthritis    PAIN AND OA RIGHT HIP   BPH (benign prostatic hyperplasia)    DDD (degenerative disc disease), lumbar    MILD-FOUND ON XRAY   Glaucoma    normotensive type with early changes   Hepatitis    HEPATITIS C -CLEARED   HLD (hyperlipidemia)    taking statin as preventative   Hypertension    Insomnia    CHRONIC   Positive PPD, treated    Pulmonary nodules    FOLLOWED BY DR. Angelyn Punt GATES WITH CT EXAMS-MOST RECENT 01/22/12    Past Surgical History:  Procedure Laterality Date   EYE SURGERY     LASIK EYE SURGERY   LITHOTRIPSY     x 3   TONSILLECTOMY     AS A CHILD   TOTAL HIP ARTHROPLASTY Right 10/25/2012   Procedure: RIGHT  TOTAL HIP ARTHROPLASTY ANTERIOR APPROACH;  Surgeon: Mauri Pole, MD;  Location: [facility];  Service: Orthopedics;  Laterality: Right;   URETERAL STONE EXTRACTION S X 2     VASCECTOMY      Social History   Socioeconomic History   Marital status: Married    Spouse name: helen katherine   Number of children: 2   Years of education: 22   Highest education level: Professional school degree (e.g., MD, DDS, DVM, JD)  Occupational History   Occupation: physician    Employer: [employer]  Tobacco Use   Smoking status: Former    Packs/day: 1.00    Years: 30.00    Pack years: 30.00    Types: Cigarettes   Smokeless tobacco: Not on file   Tobacco comments:    [place]  Substance and Sexual Activity   Alcohol use: Yes    Comment: 1&1/2 OZ  DAILY   Drug use: No   Sexual activity: Not on file  Other Topics Concern   Not on file  Social History Narrative   Not on file   Social Determinants of Health   Financial Resource Strain: Not on file  Food Insecurity: Not on file  Transportation Needs: Not on file  Physical Activity: Not on file  Stress: Not on file  Social Connections: Not on file    Family History  Problem Relation Age of Onset   Hypertension Mother    Alcohol abuse Mother    Esophageal varices Mother    Depression Mother    Colon cancer Father    Hypertension Maternal Grandmother    CVA Maternal Grandmother    Heart disease Maternal Grandfather    Lung cancer Half-Sister     Review of Systems  Respiratory:  Negative for cough, shortness of breath and wheezing.   Cardiovascular:  Positive for leg swelling.  Gastrointestinal: Negative.   Musculoskeletal:  Positive for arthralgias and back pain.  Neurological:  Positive for numbness. Negative for light-headedness and headaches.      Objective:   Vitals:   08/15/21 0830  BP: (!) 142/80  Pulse: 74  Temp: 98.6 F (37 C)  SpO2: 99%   BP Readings from Last 3 Encounters:  08/15/21 (!) 142/80  10/26/12 125/72  10/21/12 (!) 158/94   Wt Readings from Last 3 Encounters:  08/15/21 163 lb (73.9 kg)  10/25/12 188 lb (85.3 kg)  10/21/12 188 lb 9.6 oz (85.5 kg)   Body mass index is 25.53 kg/m.   Physical Exam    Constitutional: Appears well-developed and well-nourished. No distress.  HENT:  Head: Normocephalic and atraumatic.  Neck: Neck supple. No tracheal deviation present. No thyromegaly present.  No cervical lymphadenopathy Cardiovascular: Normal rate, regular rhythm and normal heart sounds.   No murmur heard. No carotid bruit .  No edema Pulmonary/Chest: Effort normal and breath sounds normal. No respiratory distress. No has no wheezes. No rales.  Abdomen: soft, NT, ND Skin: Skin is warm and dry. Not diaphoretic.  Psychiatric: Normal  mood and affect. Behavior is normal.      Assessment & Plan:    See Problem List for Assessment and Plan of chronic medical problems.     FU end of year for CPE

## 2021-08-15 ENCOUNTER — Encounter: Payer: Self-pay | Admitting: Internal Medicine

## 2021-08-15 ENCOUNTER — Other Ambulatory Visit: Payer: Self-pay

## 2021-08-15 ENCOUNTER — Ambulatory Visit (INDEPENDENT_AMBULATORY_CARE_PROVIDER_SITE_OTHER): Payer: Medicare Other | Admitting: Internal Medicine

## 2021-08-15 DIAGNOSIS — I10 Essential (primary) hypertension: Secondary | ICD-10-CM | POA: Diagnosis not present

## 2021-08-15 DIAGNOSIS — G4709 Other insomnia: Secondary | ICD-10-CM | POA: Diagnosis not present

## 2021-08-15 DIAGNOSIS — Z87442 Personal history of urinary calculi: Secondary | ICD-10-CM

## 2021-08-15 DIAGNOSIS — N400 Enlarged prostate without lower urinary tract symptoms: Secondary | ICD-10-CM | POA: Diagnosis not present

## 2021-08-15 DIAGNOSIS — F418 Other specified anxiety disorders: Secondary | ICD-10-CM

## 2021-08-15 DIAGNOSIS — E785 Hyperlipidemia, unspecified: Secondary | ICD-10-CM | POA: Diagnosis not present

## 2021-08-17 DIAGNOSIS — F418 Other specified anxiety disorders: Secondary | ICD-10-CM | POA: Insufficient documentation

## 2021-08-17 NOTE — Assessment & Plan Note (Signed)
Chronic BP well controlled at home, ok here today Continue hyzaar 50-12.5 mg daily

## 2021-08-17 NOTE — Assessment & Plan Note (Signed)
Chronic Controlled, stable Continue finasteride 5 mg daily

## 2021-08-17 NOTE — Assessment & Plan Note (Signed)
Has difficulty sleeping with travel Takes sonata prn for travel

## 2021-08-17 NOTE — Assessment & Plan Note (Signed)
Chronic Continue lovastatin 20 mg daily

## 2021-08-17 NOTE — Assessment & Plan Note (Signed)
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

## 2021-08-29 ENCOUNTER — Encounter: Payer: Self-pay | Admitting: Internal Medicine

## 2021-09-09 NOTE — Telephone Encounter (Signed)
Patient scheduled.

## 2021-10-08 ENCOUNTER — Encounter: Payer: Self-pay | Admitting: Internal Medicine

## 2021-10-09 ENCOUNTER — Ambulatory Visit
Admission: RE | Admit: 2021-10-09 | Discharge: 2021-10-09 | Disposition: A | Discharge status: Other Acute Inpatient Hospital | Payer: Medicare Other | Source: Ambulatory Visit | Attending: Internal Medicine | Admitting: Internal Medicine

## 2021-10-09 ENCOUNTER — Encounter: Payer: Self-pay | Admitting: Family Medicine

## 2021-10-09 ENCOUNTER — Ambulatory Visit (INDEPENDENT_AMBULATORY_CARE_PROVIDER_SITE_OTHER): Payer: Medicare Other | Admitting: Family Medicine

## 2021-10-09 ENCOUNTER — Encounter: Payer: Self-pay | Admitting: Internal Medicine

## 2021-10-09 ENCOUNTER — Ambulatory Visit
Admission: RE | Admit: 2021-10-09 | Discharge: 2021-10-09 | Disposition: A | Discharge status: Home / Self Care | Payer: Medicare Other | Source: Ambulatory Visit | Attending: Family Medicine | Admitting: Family Medicine

## 2021-10-09 VITALS — BP 140/84 | HR 55 | Temp 98.0°F | Ht 67.0 in | Wt 170.8 lb

## 2021-10-09 DIAGNOSIS — N200 Calculus of kidney: Secondary | ICD-10-CM | POA: Diagnosis not present

## 2021-10-09 DIAGNOSIS — R1032 Left lower quadrant pain: Secondary | ICD-10-CM

## 2021-10-09 DIAGNOSIS — Z87442 Personal history of urinary calculi: Secondary | ICD-10-CM | POA: Diagnosis not present

## 2021-10-09 DIAGNOSIS — R109 Unspecified abdominal pain: Secondary | ICD-10-CM | POA: Diagnosis not present

## 2021-10-09 DIAGNOSIS — K5730 Diverticulosis of large intestine without perforation or abscess without bleeding: Secondary | ICD-10-CM | POA: Diagnosis not present

## 2021-10-09 DIAGNOSIS — K4090 Unilateral inguinal hernia, without obstruction or gangrene, not specified as recurrent: Secondary | ICD-10-CM | POA: Diagnosis not present

## 2021-10-09 DIAGNOSIS — N281 Cyst of kidney, acquired: Secondary | ICD-10-CM | POA: Diagnosis not present

## 2021-10-09 LAB — POCT URINALYSIS DIPSTICK
Bilirubin, UA: NEGATIVE
Blood, UA: NEGATIVE
Glucose, UA: NEGATIVE
Ketones, UA: NEGATIVE
Leukocytes, UA: NEGATIVE
Nitrite, UA: NEGATIVE
Protein, UA: POSITIVE — AB
Spec Grav, UA: >=1.03 — AB (ref 1.010–1.025)
Urobilinogen, UA: 0.2 E.U./dL
pH, UA: 6 (ref 5.0–8.0)

## 2021-10-09 LAB — BASIC METABOLIC PANEL
BUN: 34 mg/dL — ABNORMAL HIGH (ref 6–23)
CO2: 28 mEq/L (ref 19–32)
Calcium: 11.2 mg/dL — ABNORMAL HIGH (ref 8.4–10.5)
Chloride: 103 mEq/L (ref 96–112)
Creatinine, Ser: 1.03 mg/dL (ref 0.40–1.50)
GFR: 71.55 mL/min (ref >60.00)
Glucose, Bld: 93 mg/dL (ref 70–99)
Potassium: 4.2 mEq/L (ref 3.5–5.1)
Sodium: 138 mEq/L (ref 135–145)

## 2021-10-09 LAB — URINALYSIS, ROUTINE W REFLEX MICROSCOPIC
Bilirubin Urine: NEGATIVE
Hgb urine dipstick: NEGATIVE
Ketones, ur: NEGATIVE
Leukocytes,Ua: NEGATIVE
Nitrite: NEGATIVE
RBC / HPF: NONE SEEN (ref 0–?)
Specific Gravity, Urine: 1.025 (ref 1.000–1.030)
Total Protein, Urine: NEGATIVE
Urine Glucose: NEGATIVE
Urobilinogen, UA: 0.2 (ref 0.0–1.0)
pH: 5.5 (ref 5.0–8.0)

## 2021-10-09 MED ORDER — TAMSULOSIN HCL 0.4 MG PO CAPS: 0.4000 mg | ORAL_CAPSULE | Freq: Every day | ORAL | 0 refills | Status: DC

## 2021-10-09 NOTE — Progress Notes (Signed)
? ?Subjective:  ? ? Patient ID: Brandon Watts, male    DOB: 21-Sep-1945, 76 y.o.   MRN: [id number] ? ?HPI ?Chief Complaint  ?Patient presents with  ? Flank Pain  ?  Left flank pain started about 12 days ago. Radiates to left buttocks (not as serious), left scrotum and LLQ of abdomen. Is able to still exercise.  ?Hx of kidney stones, 3 of which had to have been removed.  ? ?He is here with complaints of a one week history of left flank pain that is worsening. Reports dysuria this this morning. He is able to pass urine without much difficulty.  He also notes a loss of appetite.  ?States flank pain is radiating into his left groin and down into his testicles.  ?He was able to ride his bike yesterday but had to take breaks which is not his norm. He was able to swim without significant pain.  ? ?Denies fever, chills, dizziness, chest pain, palpitations, shortness of breath, vomiting or diarrhea.  ? ?History of nephrolithiasis. Reports 5 mm stone on the left present since 2015.  ? ?Reports taking Celebrex and Tylenol at home. Initially this provided decent pain control but not any longer. He switched from the Celebrex to Toradol 10 mg q6h.  ? ?His urologist has retired.  ? ?Past Medical History:  ?Diagnosis Date  ? Allergic rhinitis   ? Arthritis   ? PAIN AND OA RIGHT HIP  ? BPH (benign prostatic hyperplasia)   ? DDD (degenerative disc disease), lumbar   ? MILD-FOUND ON XRAY  ? Glaucoma   ? normotensive type with early changes  ? Hepatitis   ? HEPATITIS C -CLEARED  ? HLD (hyperlipidemia)   ? taking statin as preventative  ? Hypertension   ? Insomnia   ? CHRONIC  ? Positive PPD, treated   ? Pulmonary nodules   ? FOLLOWED BY DR. [place] WITH CT EXAMS-MOST RECENT 01/22/12  ? ?Past Surgical History:  ?Procedure Laterality Date  ? EYE SURGERY    ? LASIK EYE SURGERY  ? LITHOTRIPSY    ? x 3  ? TONSILLECTOMY    ? AS A CHILD  ? TOTAL HIP ARTHROPLASTY Right 10/25/2012  ? Procedure: RIGHT TOTAL HIP ARTHROPLASTY ANTERIOR  APPROACH;  Surgeon: Mauri Pole, MD;  Location: [facility];  Service: Orthopedics;  Laterality: Right;  ? TRIGGER FINGER RELEASE Right 2016  ? third digit right hand. Dr.Supple  ? URETERAL STONE EXTRACTION S X 2    ? VASCECTOMY    ? ?Current Outpatient Medications on File Prior to Visit  ?Medication Sig Dispense Refill  ? bimatoprost (LUMIGAN) 0.01 % SOLN 1 drop into both eyes    ? celecoxib (CELEBREX) 200 MG capsule TAKE 1 CAPSULE DAILY. 30 capsule 11  ? finasteride (PROSCAR) 5 MG tablet Take 5 mg by mouth daily.    ? fluticasone (FLONASE) 50 MCG/ACT nasal spray Place 2 sprays into the nose daily. 16 g 6  ? ketoconazole (NIZORAL) 2 % shampoo     ? ketorolac (TORADOL) 10 MG tablet Take 10 mg by mouth every 6 (six) hours as needed.    ? losartan-hydrochlorothiazide (HYZAAR) 50-12.5 MG tablet Take 1 tablet by mouth daily.    ? lovastatin (MEVACOR) 20 MG tablet Take 20 mg by mouth daily.    ? Timolol-Brimonidine-Dorzolamid 0.5-0.15-2 % SOLN     ? zaleplon (SONATA) 10 MG capsule Take 10 mg by mouth as needed (sleep).    ? ALPRAZolam (  XANAX) 0.25 MG tablet Take 0.25 mg by mouth once. (Patient not taking: Reported on 10/09/2021)    ? ?No current facility-administered medications on file prior to visit.  ? ? ? ? ?Review of Systems ?Pertinent positives and negatives in the history of present illness. ? ?   ?Objective:  ? Physical Exam ?Constitutional:   ?   General: He is not in acute distress. ?   Appearance: Normal appearance. He is not ill-appearing.  ?Eyes:  ?   Conjunctiva/sclera: Conjunctivae normal.  ?Cardiovascular:  ?   Rate and Rhythm: Normal rate and regular rhythm.  ?Pulmonary:  ?   Effort: Pulmonary effort is normal.  ?   Breath sounds: Normal breath sounds.  ?Abdominal:  ?   General: Abdomen is flat. Bowel sounds are normal. There is no distension.  ?   Palpations: Abdomen is soft.  ?   Tenderness: There is abdominal tenderness in the left lower quadrant. There is left CVA tenderness. There is no right CVA  tenderness, guarding or rebound.  ?Skin: ?   General: Skin is warm and dry.  ?Neurological:  ?   Mental Status: He is alert and oriented to person, place, and time.  ?   Motor: Motor function is intact.  ?   Coordination: Coordination is intact.  ?Psychiatric:     ?   Mood and Affect: Mood and affect normal.     ?   Speech: Speech normal.     ?   Behavior: Behavior normal.  ? ?BP 140/84 (BP Location: Left Arm, Patient Position: Sitting, Cuff Size: Large)   Pulse (!) 55   Temp 98 ?F (36.7 ?C) (Temporal)   Ht '5\' 7"'$  (1.702 m)   Wt 170 lb 12.8 oz (77.5 kg)   SpO2 98%   BMI 26.75 kg/m?  ? ? ? ? ?   ?Assessment & Plan:  ?Acute left flank pain - Plan: Basic metabolic panel, Urinalysis, Routine w reflex microscopic, tamsulosin (FLOMAX) 0.4 MG CAPS capsule, Urinalysis, Routine w reflex microscopic, Basic metabolic panel, POCT urinalysis dipstick ? ?Left lower quadrant abdominal pain - Plan: CT RENAL STONE STUDY, Basic metabolic panel, Urinalysis, Routine w reflex microscopic, Urinalysis, Routine w reflex microscopic, Basic metabolic panel, POCT urinalysis dipstick ? ?History of renal stone - Plan: POCT urinalysis dipstick ? ?Suspect renal stone. He has a history of the same. Currently no red flag symptoms. ?He will continue ketoralac which he has at home for pain. I also prescribed tamsulosin.  He is aware that he will need to follow up if worsening (fever, vomiting, unable to urinate). Follow up pending CT results.  ?

## 2021-10-12 ENCOUNTER — Encounter: Payer: Self-pay | Admitting: Internal Medicine

## 2021-10-12 DIAGNOSIS — N2889 Other specified disorders of kidney and ureter: Secondary | ICD-10-CM | POA: Insufficient documentation

## 2021-10-12 NOTE — Progress Notes (Signed)
? ? ?Subjective:  ? ? Patient ID: Brandon Watts, male    DOB: 1946-07-04, 76 y.o.   MRN: [id number] ? ?This visit occurred during the SARS-CoV-2 public health emergency.  Safety protocols were in place, including screening questions prior to the visit, additional usage of staff PPE, and extensive cleaning of exam room while observing appropriate contact time as indicated for disinfecting solutions. ? ? ? ?HPI ?Brandon Watts is here for  ?Chief Complaint  ?Patient presents with  ? Flank Pain  ?  Left flank pain  ? ? ?Left lower back pain radiating into buttock and left lateral leg.  Pain is sharp and constant.  Pain is still radiating to left groin.  It started two weeks ago.   ? ?He had dysuria last week.  No difficulty urinating. Positive loss of appetite.  H/o renal stones. No obvious leg weakness, N/T.  Initially celebrex and tylenol helped pain.  He then upgraded to toradol 10 mg q 6 hrs.  Now just taking celebrex and tylenol.  Pain is not controlled - not sleeping.    ? ? ?Lab Results  ?Component Value Date  ? WBC 13.1 (H) 10/26/2012  ? HGB 12.2 (L) 10/26/2012  ? HCT 34.8 (L) 10/26/2012  ? PLT 125 (L) 10/26/2012  ? GLUCOSE 93 10/09/2021  ? CHOL 171 10/21/2012  ? TRIG 103 10/21/2012  ? HDL 58 10/21/2012  ? [place] 92 10/21/2012  ? ALT 21 07/19/2006  ? AST 23 07/19/2006  ? NA 138 10/09/2021  ? K 4.2 10/09/2021  ? CL 103 10/09/2021  ? CREATININE 1.03 10/09/2021  ? BUN 34 (H) 10/09/2021  ? CO2 28 10/09/2021  ? INR 1.03 10/21/2012  ? ? ?CT RENAL STONE STUDY ?CLINICAL DATA:  Ten days of left flank pain radiating to the groin. ?History of renal stones. ? ?EXAM: ?CT ABDOMEN AND PELVIS WITHOUT CONTRAST ? ?TECHNIQUE: ?Multidetector CT imaging of the abdomen and pelvis was performed ?following the standard protocol without IV contrast. ? ?RADIATION DOSE REDUCTION: This exam was performed according to the ?departmental dose-optimization program which includes automated ?exposure control, adjustment of the mA and/or kV  according to ?patient size and/or use of iterative reconstruction technique. ? ?COMPARISON:  CT Nov 16 2013 ? ?FINDINGS: ?Lower chest: No acute abnormality. ? ?Hepatobiliary: No suspicious hepatic lesion. Gallbladder is ?unremarkable. No biliary ductal dilation. ? ?Pancreas: No pancreatic ductal dilation or evidence of acute ?inflammation. ? ?Spleen: No splenomegaly or focal splenic lesion. ? ?Adrenals/Urinary Tract: Bilateral adrenal glands appear normal. ? ?No hydronephrosis. Nonobstructive 9 mm staghorn type left interpolar ?renal calculus. No obstructive ureteral or bladder calculi ?identified. ? ?Hypodense 2 cm right upper pole renal cyst. Exophytic 3.8 cm left ?renal lesion previously measured 1.8 cm, with density greater than ?that expected for a simple cyst. ? ?Urinary bladder is unremarkable for degree of distension. ? ?Stomach/Bowel: No enteric contrast was administered. Stomach is ?unremarkable for degree of distension. No pathologic dilation of ?small or large bowel. The appendix and terminal ileum appear normal. ?Left-sided colonic diverticulosis without findings of acute ?diverticulitis. ? ?Vascular/Lymphatic: Aortic and branch vessel atherosclerosis without ?abdominal aortic aneurysm. No pathologically enlarged abdominal or ?pelvic lymph nodes. ? ?Reproductive: Prostate is unremarkable. ? ?Other: No significant abdominopelvic free fluid. Tiny fat containing ?left inguinal hernia. ? ?Musculoskeletal: Right total hip arthroplasty. Degenerative change ?of the left hip. Multilevel degenerative changes spine. No acute ?osseous abnormality. ? ?IMPRESSION: ?1. Nonobstructive 9 mm left interpolar renal calculus. No ?obstructive ureteral or bladder calculi identified. ?2.  Exophytic 3.8 cm left renal lesion with density greater than that ?expected for a simple cyst. Recommend further evaluation with ?nonemergent renal ultrasound. ?3. Tiny fat containing left inguinal hernia. ?4. Left-sided colonic diverticulosis  without findings of acute ?diverticulitis. ?5. Aortic Atherosclerosis (ICD10-I70.0). ? ?Electronically Signed ?  By: Dahlia Bailiff M.D. ?  On: 10/09/2021 13:10 ? ? ? ? ?Medications and allergies reviewed with patient and updated if appropriate. ? ?Current Outpatient Medications on File Prior to Visit  ?Medication Sig Dispense Refill  ? ALPRAZolam (XANAX) 0.25 MG tablet Take 0.25 mg by mouth once.    ? bimatoprost (LUMIGAN) 0.01 % SOLN 1 drop into both eyes    ? celecoxib (CELEBREX) 200 MG capsule TAKE 1 CAPSULE DAILY. 30 capsule 11  ? finasteride (PROSCAR) 5 MG tablet Take 5 mg by mouth daily.    ? ketoconazole (NIZORAL) 2 % shampoo     ? ketorolac (TORADOL) 10 MG tablet Take 10 mg by mouth every 6 (six) hours as needed.    ? losartan-hydrochlorothiazide (HYZAAR) 50-12.5 MG tablet Take 1 tablet by mouth daily.    ? lovastatin (MEVACOR) 20 MG tablet Take 20 mg by mouth daily.    ? tamsulosin (FLOMAX) 0.4 MG CAPS capsule Take 1 capsule (0.4 mg total) by mouth daily. 30 capsule 0  ? Timolol-Brimonidine-Dorzolamid 0.5-0.15-2 % SOLN     ? zaleplon (SONATA) 10 MG capsule Take 10 mg by mouth as needed (sleep).    ? fluticasone (FLONASE) 50 MCG/ACT nasal spray Place 2 sprays into the nose daily. 16 g 6  ? ?No current facility-administered medications on file prior to visit.  ? ? ?Review of Systems ? ?   ?Objective:  ? ?Vitals:  ? 10/13/21 0816  ?BP: 140/82  ?Pulse: 66  ?Temp: 98.2 ?F (36.8 ?C)  ?SpO2: 98%  ? ?BP Readings from Last 3 Encounters:  ?10/13/21 140/82  ?10/09/21 140/84  ?08/15/21 (!) 142/80  ? ?Wt Readings from Last 3 Encounters:  ?10/09/21 170 lb 12.8 oz (77.5 kg)  ?08/15/21 163 lb (73.9 kg)  ?10/25/12 188 lb (85.3 kg)  ? ?Body mass index is 26.75 kg/m?. ? ?  ?Physical Exam ?Constitutional:   ?   General: He is not in acute distress. ?   Appearance: Normal appearance.  ?HENT:  ?   Head: Normocephalic.  ?Musculoskeletal:     ?   General: No swelling or deformity.  ?   Right lower leg: No edema.  ?   Left lower  leg: No edema.  ?   Comments: Pos tenderness lower lumbar spine, left paravertebral muscles,left lower back near SI joint and into upper buttock region.   ?Skin: ?   General: Skin is warm and dry.  ?Neurological:  ?   Mental Status: He is alert.  ?   Sensory: No sensory deficit.  ?   Motor: No weakness.  ?   Deep Tendon Reflexes: Reflexes normal.  ?   Comments: Pos left leg straight leg raise  ? ?   ? ? ? ? ? ?Assessment & Plan:  ? ? ?See Problem List for Assessment and Plan of chronic medical problems.  ? ? ? ? ?

## 2021-10-12 NOTE — Patient Instructions (Addendum)
? ? ?  Medications changes include :   prednisone 50 mg daily x 5 days, gabapentin 100-300 mg QHS, valium 2 mg  ? ? ?Your prescription(s) have been sent to your pharmacy.  ? ? ?A referral was ordered for urology.     Someone from that office will call you to schedule an appointment.  ? ? ?

## 2021-10-13 ENCOUNTER — Ambulatory Visit (INDEPENDENT_AMBULATORY_CARE_PROVIDER_SITE_OTHER): Payer: Medicare Other | Admitting: Internal Medicine

## 2021-10-13 VITALS — BP 140/82 | HR 66 | Temp 98.2°F | Ht 67.0 in

## 2021-10-13 DIAGNOSIS — N2889 Other specified disorders of kidney and ureter: Secondary | ICD-10-CM

## 2021-10-13 DIAGNOSIS — M5442 Lumbago with sciatica, left side: Secondary | ICD-10-CM | POA: Diagnosis not present

## 2021-10-13 MED ORDER — PREDNISONE 50 MG PO TABS: 50.0000 mg | ORAL_TABLET | Freq: Every day | ORAL | 0 refills | Status: DC

## 2021-10-13 MED ORDER — GABAPENTIN 100 MG PO CAPS: 100.0000 mg | ORAL_CAPSULE | Freq: Every day | ORAL | 3 refills | Status: DC

## 2021-10-13 MED ORDER — DIAZEPAM 2 MG PO TABS: 2.0000 mg | ORAL_TABLET | Freq: Four times a day (QID) | ORAL | 0 refills | Status: DC | PRN

## 2021-10-13 NOTE — Assessment & Plan Note (Signed)
Acute ?Lower back pain with radiculopathy down left leg ?No N/T, weakness and DTR normal ?Prednisone 50 mg qd x 5 days ?Gabapentin 100-300 mg HS  ?Valium 2 mg q 6 hr prn mm spasms ?Can restart celebrex after completing prednisone - but will be careful not to take too long ?Tylenol prn ?Heat, ice ?Consider massage, PT ?

## 2021-10-13 NOTE — Assessment & Plan Note (Signed)
Has had b/l renal cysts for a while ?Left cyst/mass has grown in size - density not compatible with simple cyst ?Needs to be followed, evaluated further - referred to urology - also has 9 mm stone that may need to be treated ?

## 2021-10-15 ENCOUNTER — Encounter: Payer: Self-pay | Admitting: Internal Medicine

## 2021-10-16 DIAGNOSIS — R2689 Other abnormalities of gait and mobility: Secondary | ICD-10-CM | POA: Diagnosis not present

## 2021-10-16 DIAGNOSIS — M25552 Pain in left hip: Secondary | ICD-10-CM | POA: Diagnosis not present

## 2021-10-16 DIAGNOSIS — M5432 Sciatica, left side: Secondary | ICD-10-CM | POA: Diagnosis not present

## 2021-10-16 MED ORDER — PREDNISONE 50 MG PO TABS: 50.0000 mg | ORAL_TABLET | Freq: Every day | ORAL | 0 refills | Status: DC

## 2021-10-20 ENCOUNTER — Encounter: Payer: Self-pay | Admitting: Internal Medicine

## 2021-10-20 DIAGNOSIS — H43813 Vitreous degeneration, bilateral: Secondary | ICD-10-CM | POA: Diagnosis not present

## 2021-10-20 DIAGNOSIS — R2689 Other abnormalities of gait and mobility: Secondary | ICD-10-CM | POA: Diagnosis not present

## 2021-10-20 DIAGNOSIS — H04123 Dry eye syndrome of bilateral lacrimal glands: Secondary | ICD-10-CM | POA: Diagnosis not present

## 2021-10-20 DIAGNOSIS — H401132 Primary open-angle glaucoma, bilateral, moderate stage: Secondary | ICD-10-CM | POA: Diagnosis not present

## 2021-10-20 DIAGNOSIS — H2511 Age-related nuclear cataract, right eye: Secondary | ICD-10-CM | POA: Diagnosis not present

## 2021-10-20 DIAGNOSIS — H353131 Nonexudative age-related macular degeneration, bilateral, early dry stage: Secondary | ICD-10-CM | POA: Diagnosis not present

## 2021-10-20 DIAGNOSIS — M25552 Pain in left hip: Secondary | ICD-10-CM | POA: Diagnosis not present

## 2021-10-20 DIAGNOSIS — M5432 Sciatica, left side: Secondary | ICD-10-CM | POA: Diagnosis not present

## 2021-10-21 ENCOUNTER — Other Ambulatory Visit: Payer: Self-pay | Admitting: Internal Medicine

## 2021-10-28 ENCOUNTER — Encounter: Payer: Self-pay | Admitting: Internal Medicine

## 2021-10-29 DIAGNOSIS — M25552 Pain in left hip: Secondary | ICD-10-CM | POA: Diagnosis not present

## 2021-10-29 DIAGNOSIS — R2689 Other abnormalities of gait and mobility: Secondary | ICD-10-CM | POA: Diagnosis not present

## 2021-10-29 DIAGNOSIS — M5432 Sciatica, left side: Secondary | ICD-10-CM | POA: Diagnosis not present

## 2021-11-04 DIAGNOSIS — R2689 Other abnormalities of gait and mobility: Secondary | ICD-10-CM | POA: Diagnosis not present

## 2021-11-04 DIAGNOSIS — M5432 Sciatica, left side: Secondary | ICD-10-CM | POA: Diagnosis not present

## 2021-11-04 DIAGNOSIS — M25552 Pain in left hip: Secondary | ICD-10-CM | POA: Diagnosis not present

## 2021-11-27 ENCOUNTER — Encounter: Payer: Self-pay | Admitting: Internal Medicine

## 2021-11-27 DIAGNOSIS — N289 Disorder of kidney and ureter, unspecified: Secondary | ICD-10-CM

## 2021-12-03 ENCOUNTER — Ambulatory Visit
Admission: RE | Admit: 2021-12-03 | Discharge: 2021-12-03 | Disposition: A | Discharge status: Home / Self Care | Payer: Medicare Other | Source: Ambulatory Visit | Attending: Internal Medicine | Admitting: Internal Medicine

## 2021-12-03 DIAGNOSIS — N200 Calculus of kidney: Secondary | ICD-10-CM | POA: Diagnosis not present

## 2021-12-03 DIAGNOSIS — N281 Cyst of kidney, acquired: Secondary | ICD-10-CM | POA: Diagnosis not present

## 2021-12-03 DIAGNOSIS — N289 Disorder of kidney and ureter, unspecified: Secondary | ICD-10-CM

## 2021-12-11 DIAGNOSIS — H401112 Primary open-angle glaucoma, right eye, moderate stage: Secondary | ICD-10-CM | POA: Diagnosis not present

## 2021-12-11 DIAGNOSIS — H2511 Age-related nuclear cataract, right eye: Secondary | ICD-10-CM | POA: Diagnosis not present

## 2022-04-06 ENCOUNTER — Encounter: Payer: Self-pay | Admitting: Internal Medicine

## 2022-05-05 DIAGNOSIS — H353131 Nonexudative age-related macular degeneration, bilateral, early dry stage: Secondary | ICD-10-CM | POA: Diagnosis not present

## 2022-05-05 DIAGNOSIS — H2512 Age-related nuclear cataract, left eye: Secondary | ICD-10-CM | POA: Diagnosis not present

## 2022-05-05 DIAGNOSIS — H401132 Primary open-angle glaucoma, bilateral, moderate stage: Secondary | ICD-10-CM | POA: Diagnosis not present

## 2022-05-05 DIAGNOSIS — H04123 Dry eye syndrome of bilateral lacrimal glands: Secondary | ICD-10-CM | POA: Diagnosis not present

## 2022-05-05 DIAGNOSIS — Z961 Presence of intraocular lens: Secondary | ICD-10-CM | POA: Diagnosis not present

## 2022-05-05 DIAGNOSIS — H43813 Vitreous degeneration, bilateral: Secondary | ICD-10-CM | POA: Diagnosis not present

## 2022-05-28 ENCOUNTER — Telehealth: Payer: Self-pay | Admitting: Internal Medicine

## 2022-05-28 NOTE — Telephone Encounter (Signed)
Called pt to schedule AWV, pt stated he would call back and schedule at a later time.

## 2022-06-16 ENCOUNTER — Other Ambulatory Visit: Payer: Self-pay | Admitting: Internal Medicine

## 2022-06-24 DIAGNOSIS — H903 Sensorineural hearing loss, bilateral: Secondary | ICD-10-CM | POA: Diagnosis not present

## 2022-07-10 ENCOUNTER — Other Ambulatory Visit: Payer: Self-pay | Admitting: Internal Medicine

## 2022-07-17 ENCOUNTER — Ambulatory Visit: Payer: Medicare Other | Admitting: Podiatry

## 2022-07-17 ENCOUNTER — Encounter: Payer: Self-pay | Admitting: Podiatry

## 2022-07-17 DIAGNOSIS — M79674 Pain in right toe(s): Secondary | ICD-10-CM | POA: Diagnosis not present

## 2022-07-17 DIAGNOSIS — B351 Tinea unguium: Secondary | ICD-10-CM

## 2022-07-17 DIAGNOSIS — M79675 Pain in left toe(s): Secondary | ICD-10-CM | POA: Diagnosis not present

## 2022-07-18 NOTE — Progress Notes (Signed)
Subjective:   Patient ID: Brandon Watts, male   DOB: 77 y.o.   MRN: [id number]   HPI Patient presents having incurvated nailbeds 1-5 both feet that become aggravated and make it hard for him to at times trim and they become discomforting the corners and they are discolored.  Patient no longer smokes and is very active   Review of Systems  All other systems reviewed and are negative.       Objective:  Physical Exam Vitals and nursing note reviewed.  Constitutional:      Appearance: He is well-developed.  Pulmonary:     Effort: Pulmonary effort is normal.  Musculoskeletal:        General: Normal range of motion.  Skin:    General: Skin is warm.  Neurological:     Mental Status: He is alert.     Neurovascular status was found to be intact muscle strength found to be adequate range of motion within normal limits.  Patient does have incurvation of his nailbeds 1-5 both feet thickened irritated especially around the big toenails but all nails have involvement     Assessment:  Chronic mycotic nail infection with irritation pain of the borders that he cannot take care of himself     Plan:  H&P reviewed debrided nailbeds 1-5 both feet no iatrogenic bleeding and reappoint to recheck

## 2022-07-20 ENCOUNTER — Other Ambulatory Visit: Payer: Self-pay | Admitting: Internal Medicine

## 2022-08-18 ENCOUNTER — Encounter: Payer: Medicare Other | Admitting: Internal Medicine

## 2022-08-24 ENCOUNTER — Encounter: Payer: Self-pay | Admitting: Internal Medicine

## 2022-08-25 ENCOUNTER — Encounter: Payer: Self-pay | Admitting: Internal Medicine

## 2022-08-25 DIAGNOSIS — R739 Hyperglycemia, unspecified: Secondary | ICD-10-CM | POA: Insufficient documentation

## 2022-08-25 DIAGNOSIS — R55 Syncope and collapse: Secondary | ICD-10-CM | POA: Insufficient documentation

## 2022-08-25 NOTE — Progress Notes (Addendum)
Subjective:    Patient ID: Brandon Watts, male    DOB: 1946-01-21, 77 y.o.   MRN: [id number]     HPI Lot is here for a physical exam.  Left leg edema - mild and intermittent.  Likely venous insuff - he is not concerned.  Improved with elevation.  Occasionally when going up stairs he has to stop.  He will feel lightheaded, short of breath and have tachycardia.  Is not irregular.  This does not happen consistently.  He is rapid recovery-less than 3 minutes.  He is walking daily, swimming interoperative  He does feel some overall increased myalgias which she thinks is normal for aging.  Episode of presyncope-related to urination.  He felt lightheaded and knew what was happening so he went to go sit down.  Just before sitting down he did lose control of his arms and legs.  There was no LOC.  No associated symptoms.  Recovered within 15 minutes.  He was straining to urinate so he thinks it was likely vasovagal.  Denies being dehydrated.  Has not had any other episodes.    right shoulder pain - better when masseuse works on rotator cuff     Medications and allergies reviewed with patient and updated if appropriate.  Current Outpatient Medications on File Prior to Visit  Medication Sig Dispense Refill   ALPRAZolam (XANAX) 0.25 MG tablet Take 0.25 mg by mouth once.     bimatoprost (LUMIGAN) 0.01 % SOLN 1 drop into both eyes     celecoxib (CELEBREX) 200 MG capsule TAKE 1 CAPSULE ONCE DAILY AS NEEDED, TAKE WITH FOOD. 60 capsule 1   finasteride (PROSCAR) 5 MG tablet TAKE ONE TABLET ONCE DAILY 90 tablet 3   ketoconazole (NIZORAL) 2 % shampoo      ketorolac (TORADOL) 10 MG tablet Take 10 mg by mouth every 6 (six) hours as needed.     losartan-hydrochlorothiazide (HYZAAR) 50-12.5 MG tablet TAKE 1 TABLET ONCE DAILY. 90 90 tablet 3   lovastatin (MEVACOR) 20 MG tablet TAKE ONE TABLET ONCE DAILY WITH A MEAL 90 tablet 3   Timolol-Brimonidine-Dorzolamid 0.5-0.15-2 % SOLN      zaleplon  (SONATA) 10 MG capsule TAKE ONE CAPSULE BY MOUTH AS NEEDED FOR SLEEP 90 capsule 1   fluticasone (FLONASE) 50 MCG/ACT nasal spray Place 2 sprays into the nose daily. 16 g 6   No current facility-administered medications on file prior to visit.    Review of Systems  Constitutional:  Negative for fever.  Eyes:  Negative for visual disturbance.  Respiratory:  Positive for shortness of breath (episodic with strenuous exertion). Negative for cough and wheezing.   Cardiovascular:  Positive for leg swelling (left foot, pitting intermittent). Negative for chest pain and palpitations.  Gastrointestinal:  Negative for abdominal pain, blood in stool, constipation and diarrhea.       No gerd  Genitourinary:  Positive for difficulty urinating.  Musculoskeletal:  Positive for arthralgias and back pain (lower back - chronic).  Skin:  Negative for rash.       Mild psoriasis  Neurological:  Positive for light-headedness. Negative for headaches.  Psychiatric/Behavioral:  Negative for dysphoric mood. The patient is not nervous/anxious.        Objective:   Vitals:   08/26/22 0752  BP: 126/72  Pulse: 69  Temp: 98 F (36.7 C)  SpO2: 98%   Filed Weights   08/26/22 0752  Weight: 167 lb (75.8 kg)   Body mass index is 26.16  kg/m.  BP Readings from Last 3 Encounters:  08/26/22 126/72  10/13/21 140/82  10/09/21 140/84    Wt Readings from Last 3 Encounters:  08/26/22 167 lb (75.8 kg)  10/09/21 170 lb 12.8 oz (77.5 kg)  08/15/21 163 lb (73.9 kg)      Physical Exam Constitutional: He appears well-developed and well-nourished. No distress.  HENT:  Head: Normocephalic and atraumatic.  Right Ear: External ear normal.  Left Ear: External ear normal.  Mouth/Throat: Oropharynx is clear and moist.  Normal ear canals and TM b/l  Eyes: Conjunctivae and EOM are normal.  Neck: Neck supple. No tracheal deviation present. No thyromegaly present.  No carotid bruit  Cardiovascular: Normal rate,  regular rhythm, normal heart sounds and intact distal pulses.   No murmur heard. Pulmonary/Chest: Effort normal and breath sounds normal. No respiratory distress. He has no wheezes. He has no rales.  Abdominal: Soft. He exhibits no distension. There is no tenderness.  Genitourinary: deferred  Musculoskeletal: He exhibits no edema.  Lymphadenopathy:   He has no cervical adenopathy.  Skin: Skin is warm and dry. He is not diaphoretic.  Psychiatric: He has a normal mood and affect. His behavior is normal.    EKG: Sinus bradycardia at 58 bpm, possible LAE, otherwise normal EKG.  No change compared to EKG from 2014.     Assessment & Plan:   Physical exam: Screening blood work  ordered Exercise   regular - regular, hiking swimming Weight  good Substance abuse   none   Reviewed recommended immunizations.   Health Maintenance  Topic Date Due   COLONOSCOPY (Pts 45-49yr Insurance coverage will need to be confirmed)  07/11/2014   DTaP/Tdap/Td (2 - Tdap) 07/16/2016   Lung Cancer Screening  06/10/2021   Medicare Annual Wellness (AWV)  05/27/2022   COVID-19 Vaccine (6 - 2023-24 season) 09/11/2022 (Originally 05/27/2022)   Pneumonia Vaccine 76 Years old (363of 3 - PPSV23 or PCV20) 08/27/2023 (Originally 07/26/2019)   INFLUENZA VACCINE  Completed   HPV VACCINES  Aged Out   Hepatitis C Screening  Discontinued   Zoster Vaccines- Shingrix  Discontinued   Colonoscopy up to date - will get report   See Problem List for Assessment and Plan of chronic medical problems.

## 2022-08-25 NOTE — Patient Instructions (Addendum)
Blood work was ordered.   The lab is on the first floor.  EKG when you can.   Medications changes include :   none   An Echo and holter was ordered.    Return in about 1 year (around 08/27/2023) for Physical Exam.    Health Maintenance, Male Adopting a healthy lifestyle and getting preventive care are important in promoting health and wellness. Ask your health care provider about: The right schedule for you to have regular tests and exams. Things you can do on your own to prevent diseases and keep yourself healthy. What should I know about diet, weight, and exercise? Eat a healthy diet  Eat a diet that includes plenty of vegetables, fruits, low-fat dairy products, and lean protein. Do not eat a lot of foods that are high in solid fats, added sugars, or sodium. Maintain a healthy weight Body mass index (BMI) is a measurement that can be used to identify possible weight problems. It estimates body fat based on height and weight. Your health care provider can help determine your BMI and help you achieve or maintain a healthy weight. Get regular exercise Get regular exercise. This is one of the most important things you can do for your health. Most adults should: Exercise for at least 150 minutes each week. The exercise should increase your heart rate and make you sweat (moderate-intensity exercise). Do strengthening exercises at least twice a week. This is in addition to the moderate-intensity exercise. Spend less time sitting. Even light physical activity can be beneficial. Watch cholesterol and blood lipids Have your blood tested for lipids and cholesterol at 76 years of age, then have this test every 5 years. You may need to have your cholesterol levels checked more often if: Your lipid or cholesterol levels are high. You are older than 76 years of age. You are at high risk for heart disease. What should I know about cancer screening? Many types of cancers can be  detected early and may often be prevented. Depending on your health history and family history, you may need to have cancer screening at various ages. This may include screening for: Colorectal cancer. Prostate cancer. Skin cancer. Lung cancer. What should I know about heart disease, diabetes, and high blood pressure? Blood pressure and heart disease High blood pressure causes heart disease and increases the risk of stroke. This is more likely to develop in people who have high blood pressure readings or are overweight. Talk with your health care provider about your target blood pressure readings. Have your blood pressure checked: Every 3-5 years if you are 3-48 years of age. Every year if you are 81 years old or older. If you are between the ages of 106 and 44 and are a current or former smoker, ask your health care provider if you should have a one-time screening for abdominal aortic aneurysm (AAA). Diabetes Have regular diabetes screenings. This checks your fasting blood sugar level. Have the screening done: Once every three years after age 29 if you are at a normal weight and have a low risk for diabetes. More often and at a younger age if you are overweight or have a high risk for diabetes. What should I know about preventing infection? Hepatitis B If you have a higher risk for hepatitis B, you should be screened for this virus. Talk with your health care provider to find out if you are at risk for hepatitis B infection. Hepatitis C Blood testing is  recommended for: Everyone born from [birth year] through 1964. Anyone with known risk factors for hepatitis C. Sexually transmitted infections (STIs) You should be screened each year for STIs, including gonorrhea and chlamydia, if: You are sexually active and are younger than 76 years of age. You are older than 76 years of age and your health care provider tells you that you are at risk for this type of infection. Your sexual activity has changed  since you were last screened, and you are at increased risk for chlamydia or gonorrhea. Ask your health care provider if you are at risk. Ask your health care provider about whether you are at high risk for HIV. Your health care provider may recommend a prescription medicine to help prevent HIV infection. If you choose to take medicine to prevent HIV, you should first get tested for HIV. You should then be tested every 3 months for as long as you are taking the medicine. Follow these instructions at home: Alcohol use Do not drink alcohol if your health care provider tells you not to drink. If you drink alcohol: Limit how much you have to 0-2 drinks a day. Know how much alcohol is in your drink. In the [place] one drink equals one 12 oz bottle of beer (355 mL), one 5 oz glass of wine (148 mL), or one 1 oz glass of hard liquor (44 mL). Lifestyle Do not use any products that contain nicotine or tobacco. These products include cigarettes, chewing tobacco, and vaping devices, such as e-cigarettes. If you need help quitting, ask your health care provider. Do not use street drugs. Do not share needles. Ask your health care provider for help if you need support or information about quitting drugs. General instructions Schedule regular health, dental, and eye exams. Stay current with your vaccines. Tell your health care provider if: You often feel depressed. You have ever been abused or do not feel safe at home. Summary Adopting a healthy lifestyle and getting preventive care are important in promoting health and wellness. Follow your health care provider's instructions about healthy diet, exercising, and getting tested or screened for diseases. Follow your health care provider's instructions on monitoring your cholesterol and blood pressure. This information is not intended to replace advice given to you by your health care provider. Make sure you discuss any questions you have with your health care  provider. Document Revised: 11/18/2020 Document Reviewed: 11/18/2020 Elsevier Patient Education  [place].

## 2022-08-26 ENCOUNTER — Ambulatory Visit (INDEPENDENT_AMBULATORY_CARE_PROVIDER_SITE_OTHER): Payer: Medicare Other | Admitting: Internal Medicine

## 2022-08-26 ENCOUNTER — Ambulatory Visit: Payer: Medicare Other | Attending: Internal Medicine

## 2022-08-26 VITALS — BP 126/72 | HR 69 | Temp 98.0°F | Ht 67.0 in | Wt 167.0 lb

## 2022-08-26 DIAGNOSIS — N400 Enlarged prostate without lower urinary tract symptoms: Secondary | ICD-10-CM | POA: Diagnosis not present

## 2022-08-26 DIAGNOSIS — R42 Dizziness and giddiness: Secondary | ICD-10-CM | POA: Insufficient documentation

## 2022-08-26 DIAGNOSIS — Z0000 Encounter for general adult medical examination without abnormal findings: Secondary | ICD-10-CM

## 2022-08-26 DIAGNOSIS — F418 Other specified anxiety disorders: Secondary | ICD-10-CM

## 2022-08-26 DIAGNOSIS — R739 Hyperglycemia, unspecified: Secondary | ICD-10-CM | POA: Diagnosis not present

## 2022-08-26 DIAGNOSIS — E785 Hyperlipidemia, unspecified: Secondary | ICD-10-CM | POA: Diagnosis not present

## 2022-08-26 DIAGNOSIS — R55 Syncope and collapse: Secondary | ICD-10-CM | POA: Insufficient documentation

## 2022-08-26 DIAGNOSIS — I10 Essential (primary) hypertension: Secondary | ICD-10-CM

## 2022-08-26 DIAGNOSIS — G4709 Other insomnia: Secondary | ICD-10-CM

## 2022-08-26 NOTE — Assessment & Plan Note (Addendum)
Chronic He does have more difficulty urinating at times, but overall he feels his symptoms are controlled and does not need to make any medication changes Continue finasteride 5 mg daily

## 2022-08-26 NOTE — Assessment & Plan Note (Addendum)
Chronic Blood pressure well-controlled-has had a couple of episodes of lightheadedness, presyncope but BP at home ranges 106-130's so overall controlled  Currently taking losartan-hydrochlorothiazide 50-12.5 mg daily

## 2022-08-26 NOTE — Assessment & Plan Note (Signed)
Periodic elevated calcium Will check PTH, ionized calcium

## 2022-08-26 NOTE — Assessment & Plan Note (Signed)
Chronic Occasional-no consistency Occurs when going up stairs, associated with shortness of breath and tachycardia but no palpitations Has to stop-recovers quickly less than 3 minutes Not related to dehydration ?  Related to drop in BP He is very active and exercises regularly so not an endurance or deconditioning being Will get an EKG, routine blood work Echocardiogram and Holter monitor for 7 days ordered to rule out arrhythmia

## 2022-08-26 NOTE — Assessment & Plan Note (Signed)
Chronic Difficulty sleeping with travel Continue Sonata 10 mg nightly as needed

## 2022-08-26 NOTE — Assessment & Plan Note (Signed)
Note He had an episode of presyncope that felt micturition where he was straining for 2-3 minutes There was no LOC Has not had another episode Not related to dehydration Will check basic blood work, EKG, echo and Holter monitor given other episodes of lightheadedness which is likely separate from this

## 2022-08-26 NOTE — Assessment & Plan Note (Signed)
Chronic Continue Xanax 0.25 mg daily as needed

## 2022-08-26 NOTE — Assessment & Plan Note (Signed)
Chronic Check a1c Low sugar / carb diet Stressed regular exercise  

## 2022-08-26 NOTE — Assessment & Plan Note (Signed)
Chronic Regular exercise and healthy diet encouraged Check lipid panel  Continue lovastatin 20 mg daily

## 2022-08-26 NOTE — Progress Notes (Unsigned)
Enrolled for [employer] to mail a ZIO XT long term holter monitor to the patients address on file.   DOD to read. 

## 2022-08-26 NOTE — Assessment & Plan Note (Signed)
New 1 episode of lightheadedness following urination ?  Dehydration, orthostatic hypotension-vasovagal EKG today

## 2022-08-28 ENCOUNTER — Ambulatory Visit: Payer: Medicare Other

## 2022-08-28 ENCOUNTER — Other Ambulatory Visit (INDEPENDENT_AMBULATORY_CARE_PROVIDER_SITE_OTHER): Payer: Medicare Other

## 2022-08-28 DIAGNOSIS — G4709 Other insomnia: Secondary | ICD-10-CM

## 2022-08-28 DIAGNOSIS — Z0000 Encounter for general adult medical examination without abnormal findings: Secondary | ICD-10-CM

## 2022-08-28 DIAGNOSIS — I10 Essential (primary) hypertension: Secondary | ICD-10-CM

## 2022-08-28 DIAGNOSIS — E785 Hyperlipidemia, unspecified: Secondary | ICD-10-CM

## 2022-08-28 DIAGNOSIS — R739 Hyperglycemia, unspecified: Secondary | ICD-10-CM

## 2022-08-28 LAB — CBC WITH DIFFERENTIAL/PLATELET
Basophils Absolute: 0 10*3/uL (ref 0.0–0.1)
Basophils Relative: 0.6 % (ref 0.0–3.0)
Eosinophils Absolute: 0.1 10*3/uL (ref 0.0–0.7)
Eosinophils Relative: 1.9 % (ref 0.0–5.0)
HCT: 45 % (ref 39.0–52.0)
Hemoglobin: 15.7 g/dL (ref 13.0–17.0)
Lymphocytes Relative: 37.3 % (ref 12.0–46.0)
Lymphs Abs: 2.7 10*3/uL (ref 0.7–4.0)
MCHC: 34.8 g/dL (ref 30.0–36.0)
MCV: 95 fl (ref 78.0–100.0)
Monocytes Absolute: 0.8 10*3/uL (ref 0.1–1.0)
Monocytes Relative: 10.4 % (ref 3.0–12.0)
Neutro Abs: 3.6 10*3/uL (ref 1.4–7.7)
Neutrophils Relative %: 49.8 % (ref 43.0–77.0)
Platelets: 159 10*3/uL (ref 150.0–400.0)
RBC: 4.74 Mil/uL (ref 4.22–5.81)
RDW: 12.8 % (ref 11.5–15.5)
WBC: 7.3 10*3/uL (ref 4.0–10.5)

## 2022-08-28 LAB — COMPREHENSIVE METABOLIC PANEL
ALT: 17 U/L (ref 0–53)
AST: 22 U/L (ref 0–37)
Albumin: 4.3 g/dL (ref 3.5–5.2)
Alkaline Phosphatase: 63 U/L (ref 39–117)
BUN: 26 mg/dL — ABNORMAL HIGH (ref 6–23)
CO2: 31 mEq/L (ref 19–32)
Calcium: 10.9 mg/dL — ABNORMAL HIGH (ref 8.4–10.5)
Chloride: 103 mEq/L (ref 96–112)
Creatinine, Ser: 1.11 mg/dL (ref 0.40–1.50)
GFR: 65 mL/min (ref >60.00)
Glucose, Bld: 103 mg/dL — ABNORMAL HIGH (ref 70–99)
Potassium: 4.4 mEq/L (ref 3.5–5.1)
Sodium: 140 mEq/L (ref 135–145)
Total Bilirubin: 1.1 mg/dL (ref 0.2–1.2)
Total Protein: 6.9 g/dL (ref 6.0–8.3)

## 2022-08-28 LAB — LIPID PANEL
Cholesterol: 138 mg/dL (ref 0–200)
HDL: 60.2 mg/dL (ref >39.00)
LDL Cholesterol: 63 mg/dL (ref 0–99)
NonHDL: 77.53
Total CHOL/HDL Ratio: 2
Triglycerides: 74 mg/dL (ref 0.0–149.0)
VLDL: 14.8 mg/dL (ref 0.0–40.0)

## 2022-08-28 LAB — HEMOGLOBIN A1C: Hgb A1c MFr Bld: 5.5 % (ref 4.6–6.5)

## 2022-08-28 LAB — TSH: TSH: 2.51 u[IU]/mL (ref 0.35–5.50)

## 2022-08-30 DIAGNOSIS — R55 Syncope and collapse: Secondary | ICD-10-CM

## 2022-08-30 DIAGNOSIS — R42 Dizziness and giddiness: Secondary | ICD-10-CM | POA: Diagnosis not present

## 2022-08-30 LAB — PTH, INTACT AND CALCIUM
Calcium: 10.5 mg/dL — ABNORMAL HIGH (ref 8.6–10.3)
PTH: 57 pg/mL (ref 16–77)

## 2022-09-07 DIAGNOSIS — H401132 Primary open-angle glaucoma, bilateral, moderate stage: Secondary | ICD-10-CM | POA: Diagnosis not present

## 2022-09-07 DIAGNOSIS — H2512 Age-related nuclear cataract, left eye: Secondary | ICD-10-CM | POA: Diagnosis not present

## 2022-09-07 DIAGNOSIS — H353131 Nonexudative age-related macular degeneration, bilateral, early dry stage: Secondary | ICD-10-CM | POA: Diagnosis not present

## 2022-09-07 DIAGNOSIS — Z961 Presence of intraocular lens: Secondary | ICD-10-CM | POA: Diagnosis not present

## 2022-09-07 DIAGNOSIS — H04123 Dry eye syndrome of bilateral lacrimal glands: Secondary | ICD-10-CM | POA: Diagnosis not present

## 2022-09-07 DIAGNOSIS — H43813 Vitreous degeneration, bilateral: Secondary | ICD-10-CM | POA: Diagnosis not present

## 2022-09-15 ENCOUNTER — Ambulatory Visit (INDEPENDENT_AMBULATORY_CARE_PROVIDER_SITE_OTHER): Payer: Medicare Other

## 2022-09-15 DIAGNOSIS — I10 Essential (primary) hypertension: Secondary | ICD-10-CM

## 2022-09-15 DIAGNOSIS — R55 Syncope and collapse: Secondary | ICD-10-CM

## 2022-09-15 LAB — ECHOCARDIOGRAM COMPLETE
Area-P 1/2: 2.26 cm2
P 1/2 time: 781 msec
S' Lateral: 2.23 cm

## 2022-09-16 ENCOUNTER — Encounter: Payer: Self-pay | Admitting: Internal Medicine

## 2022-09-16 DIAGNOSIS — I5189 Other ill-defined heart diseases: Secondary | ICD-10-CM

## 2022-09-16 DIAGNOSIS — I10 Essential (primary) hypertension: Secondary | ICD-10-CM

## 2022-09-17 ENCOUNTER — Encounter: Payer: Self-pay | Admitting: Internal Medicine

## 2022-09-17 DIAGNOSIS — I5189 Other ill-defined heart diseases: Secondary | ICD-10-CM | POA: Insufficient documentation

## 2022-09-17 DIAGNOSIS — R6889 Other general symptoms and signs: Secondary | ICD-10-CM | POA: Diagnosis not present

## 2022-09-17 MED ORDER — EMPAGLIFLOZIN 10 MG PO TABS: 10.0000 mg | ORAL_TABLET | Freq: Every day | ORAL | 5 refills | Status: DC

## 2022-09-17 MED ORDER — SPIRONOLACTONE 25 MG PO TABS: 12.5000 mg | ORAL_TABLET | Freq: Every day | ORAL | 3 refills | Status: DC

## 2022-09-17 MED ORDER — LOSARTAN POTASSIUM 50 MG PO TABS: 50.0000 mg | ORAL_TABLET | Freq: Every day | ORAL | 3 refills | Status: DC

## 2022-09-19 DIAGNOSIS — R42 Dizziness and giddiness: Secondary | ICD-10-CM | POA: Diagnosis not present

## 2022-09-19 DIAGNOSIS — R55 Syncope and collapse: Secondary | ICD-10-CM | POA: Diagnosis not present

## 2022-09-22 ENCOUNTER — Telehealth (HOSPITAL_COMMUNITY): Payer: Self-pay

## 2022-09-22 NOTE — Telephone Encounter (Signed)
Dr. Linda Hedges called to touch base with you about your previous chat. He asked if you could call him on his cell phone at [phone]

## 2022-09-29 DIAGNOSIS — R000 Tachycardia, unspecified: Secondary | ICD-10-CM | POA: Diagnosis not present

## 2022-09-29 DIAGNOSIS — R0602 Shortness of breath: Secondary | ICD-10-CM | POA: Diagnosis not present

## 2022-09-29 DIAGNOSIS — I444 Left anterior fascicular block: Secondary | ICD-10-CM | POA: Diagnosis not present

## 2022-09-29 DIAGNOSIS — I10 Essential (primary) hypertension: Secondary | ICD-10-CM | POA: Diagnosis not present

## 2022-09-30 ENCOUNTER — Encounter: Payer: Self-pay | Admitting: Internal Medicine

## 2022-10-13 ENCOUNTER — Other Ambulatory Visit: Payer: Self-pay | Admitting: Internal Medicine

## 2022-10-19 ENCOUNTER — Other Ambulatory Visit: Payer: Self-pay | Admitting: Internal Medicine

## 2022-10-20 ENCOUNTER — Telehealth: Payer: Self-pay

## 2022-10-20 MED ORDER — LOSARTAN POTASSIUM-HCTZ 50-12.5 MG PO TABS: ORAL_TABLET | ORAL | 3 refills | Status: DC

## 2022-10-20 NOTE — Telephone Encounter (Signed)
Rx sent 

## 2022-12-15 ENCOUNTER — Telehealth: Payer: Self-pay | Admitting: Student

## 2022-12-17 NOTE — Telephone Encounter (Signed)
Error

## 2022-12-18 ENCOUNTER — Encounter: Payer: Self-pay | Admitting: Internal Medicine

## 2022-12-18 DIAGNOSIS — R361 Hematospermia: Secondary | ICD-10-CM

## 2022-12-22 ENCOUNTER — Other Ambulatory Visit (INDEPENDENT_AMBULATORY_CARE_PROVIDER_SITE_OTHER): Payer: Medicare Other

## 2022-12-22 DIAGNOSIS — I10 Essential (primary) hypertension: Secondary | ICD-10-CM | POA: Diagnosis not present

## 2022-12-22 DIAGNOSIS — R361 Hematospermia: Secondary | ICD-10-CM | POA: Diagnosis not present

## 2022-12-22 LAB — BASIC METABOLIC PANEL
BUN: 30 mg/dL — ABNORMAL HIGH (ref 6–23)
CO2: 28 mEq/L (ref 19–32)
Calcium: 10.4 mg/dL (ref 8.4–10.5)
Chloride: 103 mEq/L (ref 96–112)
Creatinine, Ser: 0.91 mg/dL (ref 0.40–1.50)
GFR: 82.32 mL/min (ref >60.00)
Glucose, Bld: 115 mg/dL — ABNORMAL HIGH (ref 70–99)
Potassium: 3.8 mEq/L (ref 3.5–5.1)
Sodium: 138 mEq/L (ref 135–145)

## 2022-12-22 LAB — PSA: PSA: 1.69 ng/mL (ref 0.10–4.00)

## 2022-12-23 ENCOUNTER — Encounter: Payer: Self-pay | Admitting: Internal Medicine

## 2022-12-23 DIAGNOSIS — R361 Hematospermia: Secondary | ICD-10-CM

## 2022-12-23 LAB — URINE CULTURE: Result:: NO GROWTH

## 2022-12-23 LAB — URINALYSIS, ROUTINE W REFLEX MICROSCOPIC
Bilirubin Urine: NEGATIVE
Hgb urine dipstick: NEGATIVE
Ketones, ur: NEGATIVE
Leukocytes,Ua: NEGATIVE
Nitrite: NEGATIVE
RBC / HPF: NONE SEEN (ref 0–?)
Specific Gravity, Urine: 1.015 (ref 1.000–1.030)
Total Protein, Urine: NEGATIVE
Urine Glucose: NEGATIVE
Urobilinogen, UA: 0.2 (ref 0.0–1.0)
pH: 7 (ref 5.0–8.0)

## 2022-12-24 ENCOUNTER — Other Ambulatory Visit: Payer: Self-pay | Admitting: Internal Medicine

## 2022-12-24 MED ORDER — SULFAMETHOXAZOLE-TRIMETHOPRIM 800-160 MG PO TABS: 1.0000 | ORAL_TABLET | Freq: Two times a day (BID) | ORAL | 0 refills | Status: DC

## 2022-12-29 ENCOUNTER — Encounter: Payer: Self-pay | Admitting: Internal Medicine

## 2023-01-11 ENCOUNTER — Other Ambulatory Visit: Payer: Self-pay | Admitting: Internal Medicine

## 2023-01-11 DIAGNOSIS — R6889 Other general symptoms and signs: Secondary | ICD-10-CM | POA: Diagnosis not present

## 2023-01-25 ENCOUNTER — Ambulatory Visit: Payer: Medicare Other

## 2023-01-29 DIAGNOSIS — Z961 Presence of intraocular lens: Secondary | ICD-10-CM | POA: Diagnosis not present

## 2023-01-29 DIAGNOSIS — H401132 Primary open-angle glaucoma, bilateral, moderate stage: Secondary | ICD-10-CM | POA: Diagnosis not present

## 2023-01-29 DIAGNOSIS — H2512 Age-related nuclear cataract, left eye: Secondary | ICD-10-CM | POA: Diagnosis not present

## 2023-01-29 DIAGNOSIS — H04123 Dry eye syndrome of bilateral lacrimal glands: Secondary | ICD-10-CM | POA: Diagnosis not present

## 2023-02-09 ENCOUNTER — Other Ambulatory Visit: Payer: Self-pay | Admitting: Internal Medicine

## 2023-03-04 DIAGNOSIS — D2261 Melanocytic nevi of right upper limb, including shoulder: Secondary | ICD-10-CM | POA: Diagnosis not present

## 2023-03-04 DIAGNOSIS — D485 Neoplasm of uncertain behavior of skin: Secondary | ICD-10-CM | POA: Diagnosis not present

## 2023-03-04 DIAGNOSIS — D2361 Other benign neoplasm of skin of right upper limb, including shoulder: Secondary | ICD-10-CM | POA: Diagnosis not present

## 2023-03-04 DIAGNOSIS — L821 Other seborrheic keratosis: Secondary | ICD-10-CM | POA: Diagnosis not present

## 2023-03-04 DIAGNOSIS — D235 Other benign neoplasm of skin of trunk: Secondary | ICD-10-CM | POA: Diagnosis not present

## 2023-03-11 ENCOUNTER — Encounter: Payer: Self-pay | Admitting: Internal Medicine

## 2023-03-12 MED ORDER — SULFAMETHOXAZOLE-TRIMETHOPRIM 800-160 MG PO TABS: 1.0000 | ORAL_TABLET | Freq: Two times a day (BID) | ORAL | 0 refills | Status: AC

## 2023-03-12 NOTE — Addendum Note (Signed)
Addended by: Pincus Sanes on: 03/12/2023 12:11 PM   Modules accepted: Orders

## 2023-03-30 ENCOUNTER — Other Ambulatory Visit (HOSPITAL_COMMUNITY): Payer: Self-pay

## 2023-03-30 MED ORDER — COVID-19 MRNA VAC-TRIS(PFIZER) 30 MCG/0.3ML IM SUSY
PREFILLED_SYRINGE | INTRAMUSCULAR | 0 refills | Status: DC
  Filled 2023-03-30: qty 0.3, 1d supply, fill #0

## 2023-04-28 ENCOUNTER — Other Ambulatory Visit (HOSPITAL_COMMUNITY): Payer: Self-pay

## 2023-06-24 DIAGNOSIS — N200 Calculus of kidney: Secondary | ICD-10-CM | POA: Diagnosis not present

## 2023-06-24 DIAGNOSIS — N281 Cyst of kidney, acquired: Secondary | ICD-10-CM | POA: Diagnosis not present

## 2023-06-25 DIAGNOSIS — H401132 Primary open-angle glaucoma, bilateral, moderate stage: Secondary | ICD-10-CM | POA: Diagnosis not present

## 2023-06-25 DIAGNOSIS — Z961 Presence of intraocular lens: Secondary | ICD-10-CM | POA: Diagnosis not present

## 2023-06-25 DIAGNOSIS — H353131 Nonexudative age-related macular degeneration, bilateral, early dry stage: Secondary | ICD-10-CM | POA: Diagnosis not present

## 2023-06-25 DIAGNOSIS — H2512 Age-related nuclear cataract, left eye: Secondary | ICD-10-CM | POA: Diagnosis not present

## 2023-06-25 DIAGNOSIS — H04123 Dry eye syndrome of bilateral lacrimal glands: Secondary | ICD-10-CM | POA: Diagnosis not present

## 2023-06-25 DIAGNOSIS — H43813 Vitreous degeneration, bilateral: Secondary | ICD-10-CM | POA: Diagnosis not present

## 2023-07-09 ENCOUNTER — Other Ambulatory Visit: Payer: Self-pay | Admitting: Internal Medicine

## 2023-07-18 ENCOUNTER — Other Ambulatory Visit: Payer: Self-pay | Admitting: Internal Medicine

## 2023-08-12 DIAGNOSIS — H10503 Unspecified blepharoconjunctivitis, bilateral: Secondary | ICD-10-CM | POA: Diagnosis not present

## 2023-08-19 DIAGNOSIS — H10503 Unspecified blepharoconjunctivitis, bilateral: Secondary | ICD-10-CM | POA: Diagnosis not present

## 2023-09-08 ENCOUNTER — Emergency Department (HOSPITAL_COMMUNITY): Payer: Medicare Other

## 2023-09-08 ENCOUNTER — Inpatient Hospital Stay (HOSPITAL_COMMUNITY)
Admission: EM | Admit: 2023-09-08 | Discharge: 2023-09-17 | DRG: 957 | Disposition: A | Discharge status: Rehabilitation Facility | Payer: Medicare Other | Attending: Internal Medicine | Admitting: Internal Medicine

## 2023-09-08 ENCOUNTER — Encounter (HOSPITAL_COMMUNITY): Payer: Self-pay | Admitting: Family Medicine

## 2023-09-08 ENCOUNTER — Other Ambulatory Visit: Payer: Self-pay

## 2023-09-08 DIAGNOSIS — S065X0A Traumatic subdural hemorrhage without loss of consciousness, initial encounter: Secondary | ICD-10-CM | POA: Diagnosis not present

## 2023-09-08 DIAGNOSIS — S065XAA Traumatic subdural hemorrhage with loss of consciousness status unknown, initial encounter: Secondary | ICD-10-CM | POA: Diagnosis not present

## 2023-09-08 DIAGNOSIS — Z96641 Presence of right artificial hip joint: Secondary | ICD-10-CM | POA: Diagnosis present

## 2023-09-08 DIAGNOSIS — S12391A Other nondisplaced fracture of fourth cervical vertebra, initial encounter for closed fracture: Secondary | ICD-10-CM | POA: Diagnosis not present

## 2023-09-08 DIAGNOSIS — J986 Disorders of diaphragm: Secondary | ICD-10-CM | POA: Diagnosis present

## 2023-09-08 DIAGNOSIS — Z471 Aftercare following joint replacement surgery: Secondary | ICD-10-CM | POA: Diagnosis not present

## 2023-09-08 DIAGNOSIS — K5900 Constipation, unspecified: Secondary | ICD-10-CM | POA: Diagnosis not present

## 2023-09-08 DIAGNOSIS — S12300D Unspecified displaced fracture of fourth cervical vertebra, subsequent encounter for fracture with routine healing: Secondary | ICD-10-CM | POA: Diagnosis not present

## 2023-09-08 DIAGNOSIS — F329 Major depressive disorder, single episode, unspecified: Secondary | ICD-10-CM | POA: Diagnosis not present

## 2023-09-08 DIAGNOSIS — I2699 Other pulmonary embolism without acute cor pulmonale: Secondary | ICD-10-CM | POA: Diagnosis not present

## 2023-09-08 DIAGNOSIS — H409 Unspecified glaucoma: Secondary | ICD-10-CM | POA: Diagnosis not present

## 2023-09-08 DIAGNOSIS — Z981 Arthrodesis status: Secondary | ICD-10-CM | POA: Diagnosis not present

## 2023-09-08 DIAGNOSIS — M4802 Spinal stenosis, cervical region: Secondary | ICD-10-CM | POA: Diagnosis present

## 2023-09-08 DIAGNOSIS — D696 Thrombocytopenia, unspecified: Secondary | ICD-10-CM | POA: Diagnosis not present

## 2023-09-08 DIAGNOSIS — S14129S Central cord syndrome at unspecified level of cervical spinal cord, sequela: Secondary | ICD-10-CM | POA: Diagnosis not present

## 2023-09-08 DIAGNOSIS — I10 Essential (primary) hypertension: Secondary | ICD-10-CM | POA: Diagnosis not present

## 2023-09-08 DIAGNOSIS — N319 Neuromuscular dysfunction of bladder, unspecified: Secondary | ICD-10-CM | POA: Diagnosis not present

## 2023-09-08 DIAGNOSIS — R918 Other nonspecific abnormal finding of lung field: Secondary | ICD-10-CM | POA: Diagnosis not present

## 2023-09-08 DIAGNOSIS — S12300A Unspecified displaced fracture of fourth cervical vertebra, initial encounter for closed fracture: Principal | ICD-10-CM | POA: Diagnosis present

## 2023-09-08 DIAGNOSIS — M5021 Other cervical disc displacement,  high cervical region: Secondary | ICD-10-CM | POA: Diagnosis not present

## 2023-09-08 DIAGNOSIS — R1312 Dysphagia, oropharyngeal phase: Secondary | ICD-10-CM | POA: Diagnosis not present

## 2023-09-08 DIAGNOSIS — J690 Pneumonitis due to inhalation of food and vomit: Secondary | ICD-10-CM | POA: Diagnosis not present

## 2023-09-08 DIAGNOSIS — Z4682 Encounter for fitting and adjustment of non-vascular catheter: Secondary | ICD-10-CM | POA: Diagnosis not present

## 2023-09-08 DIAGNOSIS — W19XXXA Unspecified fall, initial encounter: Secondary | ICD-10-CM | POA: Diagnosis not present

## 2023-09-08 DIAGNOSIS — M1990 Unspecified osteoarthritis, unspecified site: Secondary | ICD-10-CM | POA: Diagnosis not present

## 2023-09-08 DIAGNOSIS — M25511 Pain in right shoulder: Secondary | ICD-10-CM | POA: Diagnosis not present

## 2023-09-08 DIAGNOSIS — R0982 Postnasal drip: Secondary | ICD-10-CM

## 2023-09-08 DIAGNOSIS — I771 Stricture of artery: Secondary | ICD-10-CM | POA: Diagnosis not present

## 2023-09-08 DIAGNOSIS — J9811 Atelectasis: Secondary | ICD-10-CM | POA: Diagnosis not present

## 2023-09-08 DIAGNOSIS — S14129A Central cord syndrome at unspecified level of cervical spinal cord, initial encounter: Secondary | ICD-10-CM | POA: Diagnosis not present

## 2023-09-08 DIAGNOSIS — J189 Pneumonia, unspecified organism: Secondary | ICD-10-CM | POA: Diagnosis not present

## 2023-09-08 DIAGNOSIS — J9601 Acute respiratory failure with hypoxia: Secondary | ICD-10-CM | POA: Diagnosis not present

## 2023-09-08 DIAGNOSIS — S14104A Unspecified injury at C4 level of cervical spinal cord, initial encounter: Secondary | ICD-10-CM | POA: Diagnosis not present

## 2023-09-08 DIAGNOSIS — G839 Paralytic syndrome, unspecified: Secondary | ICD-10-CM | POA: Diagnosis present

## 2023-09-08 DIAGNOSIS — M79609 Pain in unspecified limb: Secondary | ICD-10-CM | POA: Diagnosis not present

## 2023-09-08 DIAGNOSIS — F05 Delirium due to known physiological condition: Secondary | ICD-10-CM | POA: Diagnosis present

## 2023-09-08 DIAGNOSIS — D649 Anemia, unspecified: Secondary | ICD-10-CM | POA: Diagnosis present

## 2023-09-08 DIAGNOSIS — Y9355 Activity, bike riding: Secondary | ICD-10-CM | POA: Diagnosis not present

## 2023-09-08 DIAGNOSIS — E871 Hypo-osmolality and hyponatremia: Secondary | ICD-10-CM | POA: Diagnosis not present

## 2023-09-08 DIAGNOSIS — I959 Hypotension, unspecified: Secondary | ICD-10-CM | POA: Diagnosis present

## 2023-09-08 DIAGNOSIS — E860 Dehydration: Secondary | ICD-10-CM | POA: Diagnosis not present

## 2023-09-08 DIAGNOSIS — M792 Neuralgia and neuritis, unspecified: Secondary | ICD-10-CM | POA: Diagnosis not present

## 2023-09-08 DIAGNOSIS — R001 Bradycardia, unspecified: Secondary | ICD-10-CM | POA: Diagnosis present

## 2023-09-08 DIAGNOSIS — M542 Cervicalgia: Secondary | ICD-10-CM | POA: Diagnosis not present

## 2023-09-08 DIAGNOSIS — S3991XA Unspecified injury of abdomen, initial encounter: Secondary | ICD-10-CM | POA: Diagnosis not present

## 2023-09-08 DIAGNOSIS — R1310 Dysphagia, unspecified: Secondary | ICD-10-CM | POA: Diagnosis not present

## 2023-09-08 DIAGNOSIS — M19011 Primary osteoarthritis, right shoulder: Secondary | ICD-10-CM | POA: Diagnosis not present

## 2023-09-08 DIAGNOSIS — R471 Dysarthria and anarthria: Secondary | ICD-10-CM | POA: Diagnosis not present

## 2023-09-08 DIAGNOSIS — K592 Neurogenic bowel, not elsewhere classified: Secondary | ICD-10-CM | POA: Diagnosis not present

## 2023-09-08 DIAGNOSIS — B1920 Unspecified viral hepatitis C without hepatic coma: Secondary | ICD-10-CM | POA: Diagnosis present

## 2023-09-08 DIAGNOSIS — Z79899 Other long term (current) drug therapy: Secondary | ICD-10-CM

## 2023-09-08 DIAGNOSIS — S12390A Other displaced fracture of fourth cervical vertebra, initial encounter for closed fracture: Secondary | ICD-10-CM | POA: Diagnosis not present

## 2023-09-08 DIAGNOSIS — J9691 Respiratory failure, unspecified with hypoxia: Secondary | ICD-10-CM | POA: Diagnosis not present

## 2023-09-08 DIAGNOSIS — S065XAD Traumatic subdural hemorrhage with loss of consciousness status unknown, subsequent encounter: Secondary | ICD-10-CM | POA: Diagnosis not present

## 2023-09-08 DIAGNOSIS — G4700 Insomnia, unspecified: Secondary | ICD-10-CM | POA: Diagnosis not present

## 2023-09-08 DIAGNOSIS — S199XXA Unspecified injury of neck, initial encounter: Secondary | ICD-10-CM | POA: Diagnosis not present

## 2023-09-08 DIAGNOSIS — S14125A Central cord syndrome at C5 level of cervical spinal cord, initial encounter: Secondary | ICD-10-CM | POA: Diagnosis not present

## 2023-09-08 DIAGNOSIS — E440 Moderate protein-calorie malnutrition: Secondary | ICD-10-CM | POA: Insufficient documentation

## 2023-09-08 DIAGNOSIS — S12330A Unspecified traumatic displaced spondylolisthesis of fourth cervical vertebra, initial encounter for closed fracture: Secondary | ICD-10-CM | POA: Diagnosis not present

## 2023-09-08 DIAGNOSIS — I700 Atherosclerosis of aorta: Secondary | ICD-10-CM | POA: Diagnosis not present

## 2023-09-08 DIAGNOSIS — R0789 Other chest pain: Secondary | ICD-10-CM | POA: Diagnosis not present

## 2023-09-08 DIAGNOSIS — S14153A Other incomplete lesion at C3 level of cervical spinal cord, initial encounter: Secondary | ICD-10-CM | POA: Diagnosis not present

## 2023-09-08 DIAGNOSIS — J90 Pleural effusion, not elsewhere classified: Secondary | ICD-10-CM | POA: Diagnosis not present

## 2023-09-08 DIAGNOSIS — G8252 Quadriplegia, C1-C4 incomplete: Secondary | ICD-10-CM | POA: Diagnosis not present

## 2023-09-08 DIAGNOSIS — S299XXA Unspecified injury of thorax, initial encounter: Secondary | ICD-10-CM | POA: Diagnosis not present

## 2023-09-08 DIAGNOSIS — I6200 Nontraumatic subdural hemorrhage, unspecified: Secondary | ICD-10-CM | POA: Diagnosis not present

## 2023-09-08 DIAGNOSIS — Z8619 Personal history of other infectious and parasitic diseases: Secondary | ICD-10-CM | POA: Diagnosis not present

## 2023-09-08 DIAGNOSIS — R451 Restlessness and agitation: Secondary | ICD-10-CM | POA: Diagnosis present

## 2023-09-08 DIAGNOSIS — F419 Anxiety disorder, unspecified: Secondary | ICD-10-CM | POA: Diagnosis present

## 2023-09-08 DIAGNOSIS — R059 Cough, unspecified: Secondary | ICD-10-CM | POA: Diagnosis not present

## 2023-09-08 DIAGNOSIS — S12391S Other nondisplaced fracture of fourth cervical vertebra, sequela: Secondary | ICD-10-CM | POA: Diagnosis not present

## 2023-09-08 DIAGNOSIS — R339 Retention of urine, unspecified: Secondary | ICD-10-CM | POA: Diagnosis not present

## 2023-09-08 DIAGNOSIS — M47812 Spondylosis without myelopathy or radiculopathy, cervical region: Secondary | ICD-10-CM | POA: Diagnosis not present

## 2023-09-08 DIAGNOSIS — S14109A Unspecified injury at unspecified level of cervical spinal cord, initial encounter: Secondary | ICD-10-CM | POA: Diagnosis not present

## 2023-09-08 DIAGNOSIS — E785 Hyperlipidemia, unspecified: Secondary | ICD-10-CM | POA: Insufficient documentation

## 2023-09-08 DIAGNOSIS — N200 Calculus of kidney: Secondary | ICD-10-CM | POA: Diagnosis not present

## 2023-09-08 DIAGNOSIS — I951 Orthostatic hypotension: Secondary | ICD-10-CM | POA: Diagnosis not present

## 2023-09-08 DIAGNOSIS — S129XXA Fracture of neck, unspecified, initial encounter: Secondary | ICD-10-CM | POA: Insufficient documentation

## 2023-09-08 DIAGNOSIS — I6521 Occlusion and stenosis of right carotid artery: Secondary | ICD-10-CM | POA: Diagnosis not present

## 2023-09-08 DIAGNOSIS — S3993XA Unspecified injury of pelvis, initial encounter: Secondary | ICD-10-CM | POA: Diagnosis not present

## 2023-09-08 DIAGNOSIS — N400 Enlarged prostate without lower urinary tract symptoms: Secondary | ICD-10-CM | POA: Diagnosis present

## 2023-09-08 DIAGNOSIS — R0602 Shortness of breath: Secondary | ICD-10-CM | POA: Diagnosis not present

## 2023-09-08 DIAGNOSIS — Z87891 Personal history of nicotine dependence: Secondary | ICD-10-CM

## 2023-09-08 DIAGNOSIS — S14123A Central cord syndrome at C3 level of cervical spinal cord, initial encounter: Secondary | ICD-10-CM | POA: Diagnosis not present

## 2023-09-08 DIAGNOSIS — Z86711 Personal history of pulmonary embolism: Secondary | ICD-10-CM

## 2023-09-08 DIAGNOSIS — R911 Solitary pulmonary nodule: Secondary | ICD-10-CM | POA: Diagnosis not present

## 2023-09-08 DIAGNOSIS — S14129D Central cord syndrome at unspecified level of cervical spinal cord, subsequent encounter: Secondary | ICD-10-CM | POA: Diagnosis not present

## 2023-09-08 DIAGNOSIS — S43002D Unspecified subluxation of left shoulder joint, subsequent encounter: Secondary | ICD-10-CM | POA: Diagnosis not present

## 2023-09-08 DIAGNOSIS — R443 Hallucinations, unspecified: Secondary | ICD-10-CM | POA: Diagnosis not present

## 2023-09-08 HISTORY — DX: Unspecified glaucoma: H40.9

## 2023-09-08 HISTORY — DX: Benign prostatic hyperplasia without lower urinary tract symptoms: N40.0

## 2023-09-08 HISTORY — DX: Other specified postprocedural states: Z98.890

## 2023-09-08 HISTORY — DX: Allergic rhinitis, unspecified: J30.9

## 2023-09-08 HISTORY — DX: Inflammatory liver disease, unspecified: K75.9

## 2023-09-08 HISTORY — DX: Essential (primary) hypertension: I10

## 2023-09-08 HISTORY — DX: Unspecified osteoarthritis, unspecified site: M19.90

## 2023-09-08 HISTORY — DX: Insomnia, unspecified: G47.00

## 2023-09-08 LAB — COMPREHENSIVE METABOLIC PANEL
ALT: 14 U/L (ref 0–44)
AST: 24 U/L (ref 15–41)
Albumin: 3.2 g/dL — ABNORMAL LOW (ref 3.5–5.0)
Alkaline Phosphatase: 47 U/L (ref 38–126)
Anion gap: 12 (ref 5–15)
BUN: 29 mg/dL — ABNORMAL HIGH (ref 8–23)
CO2: 20 mmol/L — ABNORMAL LOW (ref 22–32)
Calcium: 9.9 mg/dL (ref 8.9–10.3)
Chloride: 108 mmol/L (ref 98–111)
Creatinine, Ser: 1.1 mg/dL (ref 0.61–1.24)
GFR, Estimated: >60 mL/min (ref >60)
Glucose, Bld: 90 mg/dL (ref 70–99)
Potassium: 4.4 mmol/L (ref 3.5–5.1)
Sodium: 140 mmol/L (ref 135–145)
Total Bilirubin: 1.2 mg/dL (ref 0.0–1.2)
Total Protein: 5.4 g/dL — ABNORMAL LOW (ref 6.5–8.1)

## 2023-09-08 LAB — I-STAT CG4 LACTIC ACID, ED: Lactic Acid, Venous: 2.4 mmol/L (ref 0.5–1.9)

## 2023-09-08 LAB — CBC WITH DIFFERENTIAL/PLATELET
Abs Immature Granulocytes: 0.03 10*3/uL (ref 0.00–0.07)
Basophils Absolute: 0.1 10*3/uL (ref 0.0–0.1)
Basophils Relative: 1 %
Eosinophils Absolute: 0.2 10*3/uL (ref 0.0–0.5)
Eosinophils Relative: 3 %
HCT: 40.1 % (ref 39.0–52.0)
Hemoglobin: 14 g/dL (ref 13.0–17.0)
Immature Granulocytes: 0 %
Lymphocytes Relative: 29 %
Lymphs Abs: 2.3 10*3/uL (ref 0.7–4.0)
MCH: 32.8 pg (ref 26.0–34.0)
MCHC: 34.9 g/dL (ref 30.0–36.0)
MCV: 93.9 fL (ref 80.0–100.0)
Monocytes Absolute: 0.7 10*3/uL (ref 0.1–1.0)
Monocytes Relative: 8 %
Neutro Abs: 4.6 10*3/uL (ref 1.7–7.7)
Neutrophils Relative %: 59 %
Platelets: 136 10*3/uL — ABNORMAL LOW (ref 150–400)
RBC: 4.27 MIL/uL (ref 4.22–5.81)
RDW: 11.9 % (ref 11.5–15.5)
WBC: 7.8 10*3/uL (ref 4.0–10.5)
nRBC: 0 % (ref 0.0–0.2)

## 2023-09-08 LAB — I-STAT CHEM 8, ED
BUN: 29 mg/dL — ABNORMAL HIGH (ref 8–23)
Calcium, Ion: 1.24 mmol/L (ref 1.15–1.40)
Chloride: 111 mmol/L (ref 98–111)
Creatinine, Ser: 1.1 mg/dL (ref 0.61–1.24)
Glucose, Bld: 90 mg/dL (ref 70–99)
HCT: 37 % — ABNORMAL LOW (ref 39.0–52.0)
Hemoglobin: 12.6 g/dL — ABNORMAL LOW (ref 13.0–17.0)
Potassium: 4.3 mmol/L (ref 3.5–5.1)
Sodium: 141 mmol/L (ref 135–145)
TCO2: 21 mmol/L — ABNORMAL LOW (ref 22–32)

## 2023-09-08 MED ORDER — ACETAMINOPHEN 650 MG RE SUPP: 650.0000 mg | Freq: Four times a day (QID) | RECTAL | Status: DC | PRN

## 2023-09-08 MED ORDER — IOHEXOL 350 MG/ML SOLN
100.0000 mL | Freq: Once | INTRAVENOUS | Status: AC | PRN
  Administered 2023-09-08: 100 mL via INTRAVENOUS

## 2023-09-08 MED ORDER — GADOBUTROL 1 MMOL/ML IV SOLN
7.0000 mL | Freq: Once | INTRAVENOUS | Status: AC | PRN
  Administered 2023-09-08: 7 mL via INTRAVENOUS

## 2023-09-08 MED ORDER — HYDROMORPHONE HCL 1 MG/ML IJ SOLN
0.5000 mg | INTRAMUSCULAR | Status: DC | PRN
  Administered 2023-09-08 – 2023-09-17 (×28): 0.5 mg via INTRAVENOUS
  Filled 2023-09-08 (×11): qty 0.5
  Filled 2023-09-08: qty 1
  Filled 2023-09-08 (×4): qty 0.5
  Filled 2023-09-08: qty 1
  Filled 2023-09-08 (×11): qty 0.5

## 2023-09-08 MED ORDER — MORPHINE SULFATE (PF) 2 MG/ML IV SOLN
INTRAVENOUS | Status: AC
  Filled 2023-09-08: qty 1

## 2023-09-08 MED ORDER — MORPHINE SULFATE (PF) 2 MG/ML IV SOLN
2.0000 mg | Freq: Once | INTRAVENOUS | Status: AC
  Administered 2023-09-08: 2 mg via INTRAVENOUS

## 2023-09-08 MED ORDER — ONDANSETRON HCL 4 MG PO TABS: 4.0000 mg | ORAL_TABLET | Freq: Four times a day (QID) | ORAL | Status: DC | PRN

## 2023-09-08 MED ORDER — ACETAMINOPHEN 325 MG PO TABS
650.0000 mg | ORAL_TABLET | Freq: Four times a day (QID) | ORAL | Status: DC | PRN
  Administered 2023-09-09 – 2023-09-16 (×8): 650 mg via ORAL
  Filled 2023-09-08 (×8): qty 2

## 2023-09-08 MED ORDER — HYDROMORPHONE HCL 1 MG/ML IJ SOLN
0.5000 mg | INTRAMUSCULAR | Status: DC | PRN
  Administered 2023-09-08: 0.5 mg via INTRAVENOUS
  Filled 2023-09-08: qty 1

## 2023-09-08 MED ORDER — ONDANSETRON HCL 4 MG/2ML IJ SOLN
4.0000 mg | Freq: Four times a day (QID) | INTRAMUSCULAR | Status: DC | PRN
  Administered 2023-09-08 – 2023-09-09 (×3): 4 mg via INTRAVENOUS
  Filled 2023-09-08 (×3): qty 2

## 2023-09-08 MED ORDER — FENTANYL CITRATE PF 50 MCG/ML IJ SOSY
PREFILLED_SYRINGE | INTRAMUSCULAR | Status: AC
  Filled 2023-09-08: qty 1

## 2023-09-08 MED ORDER — HYDROMORPHONE HCL 1 MG/ML IJ SOLN
1.0000 mg | Freq: Once | INTRAMUSCULAR | Status: AC
  Administered 2023-09-08: 1 mg via INTRAVENOUS

## 2023-09-08 MED ORDER — HYDROMORPHONE HCL 1 MG/ML IJ SOLN
INTRAMUSCULAR | Status: AC
  Filled 2023-09-08: qty 1

## 2023-09-08 MED ORDER — TETANUS-DIPHTH-ACELL PERTUSSIS 5-2.5-18.5 LF-MCG/0.5 IM SUSY
0.5000 mL | PREFILLED_SYRINGE | Freq: Once | INTRAMUSCULAR | Status: DC
  Filled 2023-09-08: qty 0.5

## 2023-09-08 MED ORDER — SODIUM CHLORIDE 0.9 % IV SOLN: INTRAVENOUS | Status: AC

## 2023-09-08 MED ORDER — HYDROMORPHONE HCL 1 MG/ML IJ SOLN
1.0000 mg | Freq: Once | INTRAMUSCULAR | Status: AC
  Administered 2023-09-08: 1 mg via INTRAVENOUS
  Filled 2023-09-08: qty 1

## 2023-09-08 MED ORDER — DIAZEPAM 5 MG/ML IJ SOLN
5.0000 mg | Freq: Once | INTRAMUSCULAR | Status: AC
  Administered 2023-09-08: 5 mg via INTRAVENOUS
  Filled 2023-09-08: qty 2

## 2023-09-08 MED ORDER — GUAIFENESIN 100 MG/5ML PO LIQD
5.0000 mL | Freq: Once | ORAL | Status: AC
  Administered 2023-09-08: 5 mL via ORAL
  Filled 2023-09-08: qty 10

## 2023-09-08 MED ORDER — GABAPENTIN 300 MG PO CAPS
300.0000 mg | ORAL_CAPSULE | Freq: Three times a day (TID) | ORAL | Status: DC
  Administered 2023-09-08 – 2023-09-17 (×25): 300 mg via ORAL
  Filled 2023-09-08 (×25): qty 1

## 2023-09-08 MED ORDER — SENNA 8.6 MG PO TABS
1.0000 | ORAL_TABLET | Freq: Two times a day (BID) | ORAL | Status: DC
  Administered 2023-09-08 – 2023-09-17 (×17): 8.6 mg via ORAL
  Filled 2023-09-08 (×18): qty 1

## 2023-09-08 MED ORDER — ONDANSETRON HCL 4 MG/2ML IJ SOLN
4.0000 mg | Freq: Once | INTRAMUSCULAR | Status: AC
  Administered 2023-09-08: 4 mg via INTRAVENOUS
  Filled 2023-09-08: qty 2

## 2023-09-08 MED ORDER — FENTANYL CITRATE PF 50 MCG/ML IJ SOSY
PREFILLED_SYRINGE | INTRAMUSCULAR | Status: AC | PRN
  Administered 2023-09-08: 50 ug via INTRAVENOUS

## 2023-09-08 MED ORDER — DOCUSATE SODIUM 100 MG PO CAPS
100.0000 mg | ORAL_CAPSULE | Freq: Two times a day (BID) | ORAL | Status: DC
  Administered 2023-09-08 – 2023-09-17 (×17): 100 mg via ORAL
  Filled 2023-09-08 (×19): qty 1
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
## 2023-09-08 NOTE — ED Notes (Signed)
 Patient now able to move BLEs 3-4/5, BUE now 2-3/5

## 2023-09-08 NOTE — Progress Notes (Signed)
 Neurosurgery  Patient seen and examined.  Currently, vitals stable.  No hypotension or bradycardia. He now has movement in his legs, 4/5 KE, DF, PF bilaterally.  No movement D, B, Tri, 2/5 finger extension, abduction and hand grip.  He has dulled sensation in lower extremities and in trunk, severe allodynia in arms.  C-collar in place.  I personally reviewed imaging including MRI that was just completed.  There is small nonhemorrhagic contusion of spinal cord at C3-4, with chronic moderate-to-severe stenosis related to disc-osteophyte.  At C4-5, there is also moderate-to-severe stenosis with cord flattening but no cord signal change.  There is prevertebral swelling noted but no anterior discoligamentous disruption.  There is STIR signal in PLC at C3-4 and C4-5 without disruption.  Nondisplaced linear fracture of C4 lamina noted on CT.  I had a long discussion with the patient and wife and discussed the natural history of traumatic cervical spinal cord injury.  He does not have unstable structural injury of his cervical spine but as he has moderate-to-severe stenosis at the site of contusion, his ultimate recovery from his injury would be enhanced with early surgery for decompression and also include stabilization given his PLC injury. This would entail C3-4 and C4-5 ACDF.  Risks, benefits, alternatives, and expected convalescence were discussed with the patient.  Risks discussed included, but were not limited to bleeding, pain, infection, dysphagia, dysphonia, scar, spinal fluid leak, neurologic deficit, instability, pseudoarthrosis, adjacent segment disease, damage to nearby organs, and death.  I specifically discussed likelihood of post-op dysphagia likely for 1-2 weeks given his preoperative prevertebral swelling.   Informed consent was obtained.  For his neuropathic pain, he can be started on gabapentin, dilaudid.  Can consider Ketamine as well, though he likely would need ICU monitoring for this.  I  recommend keeping MAPs 70-90 mmHg, keeping him volume repleted.  If vasopressor support is needed to maintain MAP goals he will need ICU admission.  His HOB can be elevated to 30 degrees.  Hold off on PT until after surgery.
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
## 2023-09-08 NOTE — ED Notes (Signed)
 Pt states he is very uncomfortable, requesting pain medication.
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
## 2023-09-08 NOTE — ED Notes (Signed)
 RN accepts pt report; please do not bring patient yet as room has not been cleaned from last patient. Thank You, Clearnce Hasten [phone]

## 2023-09-08 NOTE — Plan of Care (Signed)

## 2023-09-08 NOTE — H&P (Signed)
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 Disposition Plan:    Status is: Inpatient Remains inpatient appropriate because: Inpatient, Cervical spinous fracture.   Consults called: Neurosurgery, Orthopeadics Admission status: Inpatient   Willeen Niece MD [facility]  If 7PM-7AM, please contact night-coverage   09/08/2023, 6:11 PM
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
## 2023-09-09 ENCOUNTER — Encounter (HOSPITAL_COMMUNITY)
Admission: EM | Disposition: A | Discharge status: Rehabilitation Facility | Payer: Self-pay | Source: Home / Self Care | Attending: Family Medicine

## 2023-09-09 ENCOUNTER — Other Ambulatory Visit: Payer: Self-pay

## 2023-09-09 ENCOUNTER — Inpatient Hospital Stay (HOSPITAL_COMMUNITY): Payer: Medicare Other | Admitting: Certified Registered"

## 2023-09-09 ENCOUNTER — Encounter (HOSPITAL_COMMUNITY): Payer: Self-pay | Admitting: Family Medicine

## 2023-09-09 ENCOUNTER — Inpatient Hospital Stay (HOSPITAL_COMMUNITY): Payer: Medicare Other

## 2023-09-09 DIAGNOSIS — S14109A Unspecified injury at unspecified level of cervical spinal cord, initial encounter: Secondary | ICD-10-CM | POA: Diagnosis not present

## 2023-09-09 DIAGNOSIS — I10 Essential (primary) hypertension: Secondary | ICD-10-CM

## 2023-09-09 DIAGNOSIS — M4802 Spinal stenosis, cervical region: Secondary | ICD-10-CM | POA: Diagnosis not present

## 2023-09-09 DIAGNOSIS — Z87891 Personal history of nicotine dependence: Secondary | ICD-10-CM

## 2023-09-09 DIAGNOSIS — S065XAA Traumatic subdural hemorrhage with loss of consciousness status unknown, initial encounter: Secondary | ICD-10-CM | POA: Diagnosis not present

## 2023-09-09 HISTORY — PX: ANTERIOR CERVICAL DECOMP/DISCECTOMY FUSION: SHX1161

## 2023-09-09 LAB — URINALYSIS, ROUTINE W REFLEX MICROSCOPIC
Bacteria, UA: NONE SEEN
Bilirubin Urine: NEGATIVE
Glucose, UA: NEGATIVE mg/dL
Ketones, ur: 20 mg/dL — AB
Leukocytes,Ua: NEGATIVE
Nitrite: NEGATIVE
Protein, ur: NEGATIVE mg/dL
Specific Gravity, Urine: >1.046 — ABNORMAL HIGH (ref 1.005–1.030)
pH: 6 (ref 5.0–8.0)

## 2023-09-09 LAB — TYPE AND SCREEN
ABO/RH(D): B POS
Antibody Screen: NEGATIVE

## 2023-09-09 LAB — CBC
HCT: 35.9 % — ABNORMAL LOW (ref 39.0–52.0)
Hemoglobin: 12.3 g/dL — ABNORMAL LOW (ref 13.0–17.0)
MCH: 33.2 pg (ref 26.0–34.0)
MCHC: 34.3 g/dL (ref 30.0–36.0)
MCV: 97 fL (ref 80.0–100.0)
Platelets: 121 K/uL — ABNORMAL LOW (ref 150–400)
RBC: 3.7 MIL/uL — ABNORMAL LOW (ref 4.22–5.81)
RDW: 12.1 % (ref 11.5–15.5)
WBC: 11.8 K/uL — ABNORMAL HIGH (ref 4.0–10.5)
nRBC: 0 % (ref 0.0–0.2)

## 2023-09-09 LAB — COMPREHENSIVE METABOLIC PANEL WITH GFR
ALT: 15 U/L (ref 0–44)
AST: 23 U/L (ref 15–41)
Albumin: 3.1 g/dL — ABNORMAL LOW (ref 3.5–5.0)
Alkaline Phosphatase: 40 U/L (ref 38–126)
Anion gap: 8 (ref 5–15)
BUN: 31 mg/dL — ABNORMAL HIGH (ref 8–23)
CO2: 23 mmol/L (ref 22–32)
Calcium: 9.3 mg/dL (ref 8.9–10.3)
Chloride: 107 mmol/L (ref 98–111)
Creatinine, Ser: 1.04 mg/dL (ref 0.61–1.24)
GFR, Estimated: >60 mL/min (ref >60)
Glucose, Bld: 121 mg/dL — ABNORMAL HIGH (ref 70–99)
Potassium: 3.9 mmol/L (ref 3.5–5.1)
Sodium: 138 mmol/L (ref 135–145)
Total Bilirubin: 1.5 mg/dL — ABNORMAL HIGH (ref 0.0–1.2)
Total Protein: 5.4 g/dL — ABNORMAL LOW (ref 6.5–8.1)

## 2023-09-09 LAB — MAGNESIUM: Magnesium: 2.1 mg/dL (ref 1.7–2.4)

## 2023-09-09 LAB — SURGICAL PCR SCREEN
MRSA, PCR: NEGATIVE
Staphylococcus aureus: NEGATIVE

## 2023-09-09 LAB — PHOSPHORUS: Phosphorus: 3.4 mg/dL (ref 2.5–4.6)

## 2023-09-09 LAB — ABO/RH: ABO/RH(D): B POS

## 2023-09-09 SURGERY — ANTERIOR CERVICAL DECOMPRESSION/DISCECTOMY FUSION 2 LEVEL/HARDWARE REMOVAL
Anesthesia: General

## 2023-09-09 MED ORDER — BUPIVACAINE HCL (PF) 0.5 % IJ SOLN
INTRAMUSCULAR | Status: AC
  Filled 2023-09-09: qty 30

## 2023-09-09 MED ORDER — THROMBIN 5000 UNITS EX SOLR
OROMUCOSAL | Status: DC | PRN
  Administered 2023-09-09: 5 mL via TOPICAL

## 2023-09-09 MED ORDER — THROMBIN 5000 UNITS EX KIT
PACK | CUTANEOUS | Status: AC
  Filled 2023-09-09: qty 1

## 2023-09-09 MED ORDER — PHENYLEPHRINE 80 MCG/ML (10ML) SYRINGE FOR IV PUSH (FOR BLOOD PRESSURE SUPPORT)
PREFILLED_SYRINGE | INTRAVENOUS | Status: DC | PRN
  Administered 2023-09-09: 200 ug via INTRAVENOUS

## 2023-09-09 MED ORDER — HYDROCODONE-ACETAMINOPHEN 5-325 MG PO TABS
1.0000 | ORAL_TABLET | ORAL | Status: DC | PRN
  Administered 2023-09-10: 1 via ORAL
  Filled 2023-09-09: qty 1

## 2023-09-09 MED ORDER — SODIUM CHLORIDE 0.9 % IV SOLN
INTRAVENOUS | Status: DC | PRN
  Administered 2023-09-09: .1 ug/kg/min via INTRAVENOUS

## 2023-09-09 MED ORDER — PROPOFOL 1000 MG/100ML IV EMUL
INTRAVENOUS | Status: AC
  Filled 2023-09-09: qty 100

## 2023-09-09 MED ORDER — CHLORHEXIDINE GLUCONATE 0.12 % MT SOLN
OROMUCOSAL | Status: AC
  Administered 2023-09-09: 15 mL via OROMUCOSAL
  Filled 2023-09-09: qty 15

## 2023-09-09 MED ORDER — LIDOCAINE-EPINEPHRINE 1 %-1:100000 IJ SOLN
INTRAMUSCULAR | Status: DC | PRN
  Administered 2023-09-09: 2 mL

## 2023-09-09 MED ORDER — PHENOL 1.4 % MT LIQD: 1.0000 | OROMUCOSAL | Status: DC | PRN

## 2023-09-09 MED ORDER — LACTATED RINGERS IV SOLN: INTRAVENOUS | Status: DC

## 2023-09-09 MED ORDER — ROCURONIUM BROMIDE 10 MG/ML (PF) SYRINGE
PREFILLED_SYRINGE | INTRAVENOUS | Status: AC
  Filled 2023-09-09: qty 10

## 2023-09-09 MED ORDER — PHENYLEPHRINE HCL-NACL 20-0.9 MG/250ML-% IV SOLN
INTRAVENOUS | Status: DC | PRN
  Administered 2023-09-09: 30 ug/min via INTRAVENOUS

## 2023-09-09 MED ORDER — GLYCOPYRROLATE 0.2 MG/ML IJ SOLN
INTRAMUSCULAR | Status: DC | PRN
  Administered 2023-09-09: .2 mg via INTRAVENOUS

## 2023-09-09 MED ORDER — MENTHOL 3 MG MT LOZG: 1.0000 | LOZENGE | OROMUCOSAL | Status: DC | PRN

## 2023-09-09 MED ORDER — ORAL CARE MOUTH RINSE: 15.0000 mL | Freq: Once | OROMUCOSAL | Status: AC

## 2023-09-09 MED ORDER — SODIUM CHLORIDE 0.9 % IV SOLN
0.1500 ug/kg/min | INTRAVENOUS | Status: DC
  Filled 2023-09-09: qty 2000

## 2023-09-09 MED ORDER — HYDROMORPHONE HCL 1 MG/ML IJ SOLN
INTRAMUSCULAR | Status: DC | PRN
  Administered 2023-09-09 (×2): .5 mg via INTRAVENOUS

## 2023-09-09 MED ORDER — FENTANYL CITRATE (PF) 250 MCG/5ML IJ SOLN
INTRAMUSCULAR | Status: DC | PRN
  Administered 2023-09-09: 100 ug via INTRAVENOUS
  Administered 2023-09-09 (×3): 50 ug via INTRAVENOUS

## 2023-09-09 MED ORDER — LIDOCAINE HCL URETHRAL/MUCOSAL 2 % EX GEL
1.0000 | Freq: Once | CUTANEOUS | Status: AC
  Administered 2023-09-09: 1 via URETHRAL
  Filled 2023-09-09: qty 6

## 2023-09-09 MED ORDER — CEFAZOLIN SODIUM-DEXTROSE 2-4 GM/100ML-% IV SOLN
2.0000 g | INTRAVENOUS | Status: AC
  Administered 2023-09-09: 2 g via INTRAVENOUS
  Filled 2023-09-09: qty 100

## 2023-09-09 MED ORDER — SODIUM CHLORIDE 0.9% FLUSH
3.0000 mL | Freq: Two times a day (BID) | INTRAVENOUS | Status: DC
  Administered 2023-09-09 – 2023-09-17 (×16): 3 mL via INTRAVENOUS

## 2023-09-09 MED ORDER — ACETAMINOPHEN 10 MG/ML IV SOLN
INTRAVENOUS | Status: AC
  Filled 2023-09-09: qty 100

## 2023-09-09 MED ORDER — LIDOCAINE 2% (20 MG/ML) 5 ML SYRINGE
INTRAMUSCULAR | Status: AC
  Filled 2023-09-09: qty 5

## 2023-09-09 MED ORDER — HYDROMORPHONE HCL 1 MG/ML IJ SOLN
INTRAMUSCULAR | Status: AC
  Filled 2023-09-09: qty 0.5

## 2023-09-09 MED ORDER — FENTANYL CITRATE (PF) 100 MCG/2ML IJ SOLN
INTRAMUSCULAR | Status: AC
  Filled 2023-09-09: qty 2

## 2023-09-09 MED ORDER — FLEET ENEMA RE ENEM: 1.0000 | ENEMA | Freq: Once | RECTAL | Status: DC | PRN

## 2023-09-09 MED ORDER — SODIUM CHLORIDE 0.9 % IV SOLN
250.0000 mL | INTRAVENOUS | Status: AC
  Administered 2023-09-09: 250 mL via INTRAVENOUS

## 2023-09-09 MED ORDER — METHOCARBAMOL 1000 MG/10ML IJ SOLN
500.0000 mg | Freq: Three times a day (TID) | INTRAMUSCULAR | Status: DC | PRN
  Administered 2023-09-09 – 2023-09-14 (×10): 500 mg via INTRAVENOUS
  Filled 2023-09-09 (×10): qty 10

## 2023-09-09 MED ORDER — ONDANSETRON HCL 4 MG/2ML IJ SOLN
INTRAMUSCULAR | Status: DC | PRN
  Administered 2023-09-09: 4 mg via INTRAVENOUS

## 2023-09-09 MED ORDER — ONDANSETRON HCL 4 MG/2ML IJ SOLN: 4.0000 mg | Freq: Once | INTRAMUSCULAR | Status: DC | PRN

## 2023-09-09 MED ORDER — LIDOCAINE-EPINEPHRINE 1 %-1:100000 IJ SOLN
INTRAMUSCULAR | Status: AC
  Filled 2023-09-09: qty 1

## 2023-09-09 MED ORDER — SUCCINYLCHOLINE CHLORIDE 200 MG/10ML IV SOSY
PREFILLED_SYRINGE | INTRAVENOUS | Status: AC
  Filled 2023-09-09: qty 10

## 2023-09-09 MED ORDER — SUCCINYLCHOLINE CHLORIDE 200 MG/10ML IV SOSY
PREFILLED_SYRINGE | INTRAVENOUS | Status: DC | PRN
  Administered 2023-09-09: 140 mg via INTRAVENOUS

## 2023-09-09 MED ORDER — AMISULPRIDE (ANTIEMETIC) 5 MG/2ML IV SOLN: 10.0000 mg | Freq: Once | INTRAVENOUS | Status: DC | PRN

## 2023-09-09 MED ORDER — DEXMEDETOMIDINE HCL IN NACL 80 MCG/20ML IV SOLN
INTRAVENOUS | Status: DC | PRN
  Administered 2023-09-09 (×3): 4 ug via INTRAVENOUS

## 2023-09-09 MED ORDER — CEFAZOLIN SODIUM-DEXTROSE 2-4 GM/100ML-% IV SOLN
2.0000 g | Freq: Four times a day (QID) | INTRAVENOUS | Status: AC
  Administered 2023-09-09 (×2): 2 g via INTRAVENOUS
  Filled 2023-09-09 (×2): qty 100

## 2023-09-09 MED ORDER — PROPOFOL 10 MG/ML IV BOLUS
INTRAVENOUS | Status: DC | PRN
  Administered 2023-09-09: 150 mg via INTRAVENOUS

## 2023-09-09 MED ORDER — BUPIVACAINE HCL 0.5 % IJ SOLN
INTRAMUSCULAR | Status: DC | PRN
  Administered 2023-09-09: 2 mL

## 2023-09-09 MED ORDER — DEXAMETHASONE SODIUM PHOSPHATE 10 MG/ML IJ SOLN
INTRAMUSCULAR | Status: DC | PRN
  Administered 2023-09-09: 10 mg via INTRAVENOUS

## 2023-09-09 MED ORDER — CHLORHEXIDINE GLUCONATE 0.12 % MT SOLN: 15.0000 mL | Freq: Once | OROMUCOSAL | Status: AC

## 2023-09-09 MED ORDER — ACETAMINOPHEN 10 MG/ML IV SOLN
1000.0000 mg | Freq: Once | INTRAVENOUS | Status: DC | PRN
  Administered 2023-09-09: 1000 mg via INTRAVENOUS

## 2023-09-09 MED ORDER — POLYETHYLENE GLYCOL 3350 17 G PO PACK
17.0000 g | PACK | Freq: Every day | ORAL | Status: DC | PRN
  Administered 2023-09-10 – 2023-09-17 (×5): 17 g via ORAL
  Filled 2023-09-09 (×5): qty 1

## 2023-09-09 MED ORDER — PROPOFOL 10 MG/ML IV BOLUS
INTRAVENOUS | Status: AC
  Filled 2023-09-09: qty 20

## 2023-09-09 MED ORDER — SODIUM CHLORIDE 0.9% FLUSH: 3.0000 mL | INTRAVENOUS | Status: DC | PRN

## 2023-09-09 MED ORDER — 0.9 % SODIUM CHLORIDE (POUR BTL) OPTIME
TOPICAL | Status: DC | PRN
  Administered 2023-09-09: 1000 mL

## 2023-09-09 MED ORDER — HYDROCODONE-ACETAMINOPHEN 5-325 MG PO TABS: 2.0000 | ORAL_TABLET | ORAL | Status: DC | PRN

## 2023-09-09 MED ORDER — FENTANYL CITRATE (PF) 100 MCG/2ML IJ SOLN
25.0000 ug | INTRAMUSCULAR | Status: DC | PRN
Start: 2023-09-09 — End: 2023-09-09
  Administered 2023-09-09: 25 ug via INTRAVENOUS

## 2023-09-09 MED ORDER — FENTANYL CITRATE (PF) 250 MCG/5ML IJ SOLN
INTRAMUSCULAR | Status: AC
Start: 2023-09-09 — End: ?
  Filled 2023-09-09: qty 5

## 2023-09-09 SURGICAL SUPPLY — 58 items
BAG COUNTER SPONGE SURGICOUNT (BAG) ×1 IMPLANT
BAND RUBBER #18 3X1/16 STRL (MISCELLANEOUS) ×2 IMPLANT
BENZOIN TINCTURE PRP APPL 2/3 (GAUZE/BANDAGES/DRESSINGS) ×1 IMPLANT
BIT DRILL 13 (BIT) IMPLANT
BIT DRILL NEURO 2X3.1 SFT TUCH (MISCELLANEOUS) ×1 IMPLANT
BUR MATCHSTICK NEURO 3.0 LAGG (BURR) ×1 IMPLANT
CANISTER SUCT 3000ML PPV (MISCELLANEOUS) ×1 IMPLANT
CLSR STERI-STRIP ANTIMIC 1/2X4 (GAUZE/BANDAGES/DRESSINGS) IMPLANT
DEVICE ENDSKLTN IMPL 16X14X7X6 (Cage) IMPLANT
DRAPE C-ARM 42X72 X-RAY (DRAPES) ×2 IMPLANT
DRAPE LAPAROTOMY 100X72 PEDS (DRAPES) ×1 IMPLANT
DRAPE MICROSCOPE SLANT 54X150 (MISCELLANEOUS) ×1 IMPLANT
DRESSING MEPILEX FLEX 4X4 (GAUZE/BANDAGES/DRESSINGS) ×1 IMPLANT
DRILL NEURO 2X3.1 SOFT TOUCH (MISCELLANEOUS) ×1 IMPLANT
DRSG MEPILEX FLEX 4X4 (GAUZE/BANDAGES/DRESSINGS) ×1 IMPLANT
DRSG OPSITE 4X5.5 SM (GAUZE/BANDAGES/DRESSINGS) ×2 IMPLANT
DRSG OPSITE POSTOP 4X6 (GAUZE/BANDAGES/DRESSINGS) IMPLANT
DURAPREP 26ML APPLICATOR (WOUND CARE) ×1 IMPLANT
ELECT COATED BLADE 2.86 ST (ELECTRODE) ×1 IMPLANT
ELECT REM PT RETURN 9FT ADLT (ELECTROSURGICAL) ×1 IMPLANT
ELECTRODE REM PT RTRN 9FT ADLT (ELECTROSURGICAL) ×1 IMPLANT
ENDOSKELETON IMPLANT 16X14X7X6 (Cage) ×2 IMPLANT
FEE INTRAOP CADWELL SUPPLY NCS (MISCELLANEOUS) IMPLANT
FEE INTRAOP MONITOR IMPULS NCS (MISCELLANEOUS) IMPLANT
GLOVE BIO SURGEON STRL SZ7 (GLOVE) ×2 IMPLANT
GLOVE BIOGEL PI IND STRL 7.5 (GLOVE) ×2 IMPLANT
GLOVE ECLIPSE 7.5 STRL STRAW (GLOVE) ×1 IMPLANT
GOWN STRL REUS W/ TWL LRG LVL3 (GOWN DISPOSABLE) ×2 IMPLANT
GOWN STRL REUS W/ TWL XL LVL3 (GOWN DISPOSABLE) ×1 IMPLANT
HEMOSTAT POWDER KIT SURGIFOAM (HEMOSTASIS) ×1 IMPLANT
INTRAOP CADWELL SUPPLY FEE NCS (MISCELLANEOUS) ×1 IMPLANT
INTRAOP MONITOR FEE IMPULS NCS (MISCELLANEOUS) ×1 IMPLANT
KIT BASIN OR (CUSTOM PROCEDURE TRAY) ×1 IMPLANT
KIT TURNOVER KIT B (KITS) ×1 IMPLANT
NDL HYPO 22X1.5 SAFETY MO (MISCELLANEOUS) ×1 IMPLANT
NDL SPNL 22GX3.5 QUINCKE BK (NEEDLE) ×1 IMPLANT
NEEDLE HYPO 22X1.5 SAFETY MO (MISCELLANEOUS) ×1 IMPLANT
NEEDLE SPNL 22GX3.5 QUINCKE BK (NEEDLE) ×1 IMPLANT
NS IRRIG 1000ML POUR BTL (IV SOLUTION) ×1 IMPLANT
PACK LAMINECTOMY NEURO (CUSTOM PROCEDURE TRAY) ×1 IMPLANT
PAD ARMBOARD 7.5X6 YLW CONV (MISCELLANEOUS) ×3 IMPLANT
PIN DISTRACTION 14MM (PIN) IMPLANT
PLATE 45XATL VS ELT (Plate) IMPLANT
PUTTY DBF 1CC CORTICAL FIBERS (Putty) IMPLANT
SCREW SPINAL 4X16MM SELF DRILL (Screw) IMPLANT
SCREW ST 16X4XST FXANG NS (Screw) IMPLANT
SCREW ST FIX 4.5 ATL (Screw) IMPLANT
SPIKE FLUID TRANSFER (MISCELLANEOUS) ×1 IMPLANT
SPONGE INTESTINAL PEANUT (DISPOSABLE) ×1 IMPLANT
STRIP CLOSURE SKIN 1/2X4 (GAUZE/BANDAGES/DRESSINGS) ×1 IMPLANT
SUT MNCRL AB 4-0 PS2 18 (SUTURE) ×1 IMPLANT
SUT SILK 2 0 TIES 10X30 (SUTURE) IMPLANT
SUT VIC AB 3-0 SH 8-18 (SUTURE) ×1 IMPLANT
TAPE CLOTH 3X10 TAN LF (GAUZE/BANDAGES/DRESSINGS) ×1 IMPLANT
TIP KERRISON THIN FOOTPLATE 2M (MISCELLANEOUS) ×1 IMPLANT
TOWEL GREEN STERILE (TOWEL DISPOSABLE) ×1 IMPLANT
TOWEL GREEN STERILE FF (TOWEL DISPOSABLE) ×1 IMPLANT
WATER STERILE IRR 1000ML POUR (IV SOLUTION) ×1 IMPLANT

## 2023-09-09 NOTE — Progress Notes (Signed)
    Providing Compassionate, Quality Care - Together   NEUROSURGERY PROGRESS NOTE     S: No issues overnight.    O: EXAM:  BP (!) 98/59 (BP Location: Right Arm)   Pulse 64   Temp 99.4 F (37.4 C) (Oral)   Resp 19   Ht 5\' 8"  (1.727 m)   Wt 77.9 kg   SpO2 95%   BMI 26.11 kg/m     Awake, alert, oriented Speech fluent, appropriate LEs 3/5 diffusely, sensation intact UEs 2/5, sensitive to touch    ASSESSMENT:  78 y.o. 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 -Call w/ questions/concerns.   Patrici Ranks, [facility]

## 2023-09-09 NOTE — Progress Notes (Signed)
 Pt going to OR for surgery. Report given to short stay RN. CHG bath completed. Pre-op checklist completed. Pt IV saline locked and telemetry off, CCMD notified. Pt transported off unit via bed, on 2L oxygen and family at bedside. Dionne Bucy RN

## 2023-09-09 NOTE — Transfer of Care (Signed)
 Immediate Anesthesia Transfer of Care Note  Patient: Brandon Watts  Procedure(s) Performed: ANTERIOR CERVICAL DECOMPRESSION/DISCECTOMY FUSION 2 LEVEL/HARDWARE REMOVAL - C 3-4 , C4-5  Patient Location: PACU  Anesthesia Type:General  Level of Consciousness: awake, alert , and oriented  Airway & Oxygen Therapy: Patient Spontanous Breathing and Patient connected to nasal cannula oxygen  Post-op Assessment: Report given to RN and Post -op Vital signs reviewed and stable  Post vital signs: Reviewed and stable  Last Vitals:  Vitals Value Taken Time  BP 145/65 09/09/23 1826  Temp    Pulse 70 09/09/23 1830  Resp 13 09/09/23 1830  SpO2 92 % 09/09/23 1830  Vitals shown include unfiled device data.  Last Pain:  Vitals:   09/09/23 1359  TempSrc:   PainSc: 3       Patients Stated Pain Goal: 4 (09/09/23 1359)  Complications: No notable events documented.
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
## 2023-09-09 NOTE — Anesthesia Procedure Notes (Signed)
 Arterial Line Insertion Start/End2/27/2025 3:15 PM, 09/09/2023 3:17 PM Performed by: Jairo Ben, MD, anesthesiologist  Patient location: OR. Preanesthetic checklist: patient identified, IV checked, risks and benefits discussed, surgical consent, monitors and equipment checked, pre-op evaluation, timeout performed and anesthesia consent Patient sedated Right, radial was placed Catheter size: 20 G Maximum sterile barriers used   Attempts: 1 Procedure performed without using ultrasound guided technique. Following insertion, dressing applied and Biopatch. Post procedure assessment: normal  Patient tolerated the procedure well with no immediate complications.

## 2023-09-09 NOTE — Anesthesia Procedure Notes (Signed)
 Procedure Name: Intubation Date/Time: 09/09/2023 4:53 PM  Performed by: Sandie Ano, CRNAPre-anesthesia Checklist: Patient identified, Emergency Drugs available, Suction available and Patient being monitored Patient Re-evaluated:Patient Re-evaluated prior to induction Oxygen Delivery Method: Circle System Utilized Preoxygenation: Pre-oxygenation with 100% oxygen Induction Type: IV induction Ventilation: Mask ventilation without difficulty Laryngoscope Size: Glidescope and 4 Grade View: Grade I Tube type: Oral Tube size: 7.5 mm Number of attempts: 1 Airway Equipment and Method: Stylet and Oral airway Placement Confirmation: ETT inserted through vocal cords under direct vision, positive ETCO2 and breath sounds checked- equal and bilateral Secured at: 23 cm Tube secured with: Tape Dental Injury: Teeth and Oropharynx as per pre-operative assessment
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
## 2023-09-09 NOTE — Anesthesia Postprocedure Evaluation (Signed)
 Anesthesia Post Note  Patient: Brandon Watts  Procedure(s) Performed: ANTERIOR CERVICAL DECOMPRESSION/DISCECTOMY FUSION 2 LEVEL/HARDWARE REMOVAL - C 3-4 , C4-5     Patient location during evaluation: PACU Anesthesia Type: General Level of consciousness: awake and alert, patient cooperative and oriented Pain management: pain level controlled Vital Signs Assessment: post-procedure vital signs reviewed and stable Respiratory status: spontaneous breathing, nonlabored ventilation, respiratory function stable and patient connected to nasal cannula oxygen Cardiovascular status: blood pressure returned to baseline and stable Postop Assessment: no apparent nausea or vomiting Anesthetic complications: no Comments: Pt moving feet bilat and R hand, can sense in L hand, but cannot move.  VSS, awake, much more comfortable than was preop   No notable events documented.  Last Vitals:  Vitals:   09/09/23 1830 09/09/23 1845  BP: (!) 155/68 (!) 156/66  Pulse: 70 65  Resp: 13 12  Temp: 36.7 C   SpO2: 92% 92%    Last Pain:  Vitals:   09/09/23 1359  TempSrc:   PainSc: 3                  Chanae Gemma,E. Ramya Vanbergen

## 2023-09-09 NOTE — Progress Notes (Signed)
 Coude urinary catheter placed with yellow urine return. No adverse effect. Pt tolerated well  Dominica Severin     RN
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
## 2023-09-10 ENCOUNTER — Encounter (HOSPITAL_COMMUNITY): Payer: Self-pay | Admitting: Neurosurgery

## 2023-09-10 DIAGNOSIS — S065XAA Traumatic subdural hemorrhage with loss of consciousness status unknown, initial encounter: Secondary | ICD-10-CM | POA: Diagnosis not present

## 2023-09-10 MED ORDER — CHLORHEXIDINE GLUCONATE CLOTH 2 % EX PADS
6.0000 | MEDICATED_PAD | Freq: Every day | CUTANEOUS | Status: DC
  Administered 2023-09-10 – 2023-09-17 (×8): 6 via TOPICAL

## 2023-09-10 MED ORDER — OXYCODONE HCL ER 10 MG PO T12A
10.0000 mg | EXTENDED_RELEASE_TABLET | Freq: Two times a day (BID) | ORAL | Status: DC
  Administered 2023-09-10 – 2023-09-17 (×15): 10 mg via ORAL
  Filled 2023-09-10 (×15): qty 1
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
## 2023-09-10 NOTE — Plan of Care (Signed)
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
## 2023-09-10 NOTE — Progress Notes (Signed)
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 night-coverage
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
## 2023-09-10 NOTE — Progress Notes (Signed)
    Providing Compassionate, Quality Care - Together   NEUROSURGERY PROGRESS NOTE     S: No issues overnight.    O: EXAM:  BP (!) 153/71 (BP Location: Right Arm)   Pulse (!) 59   Temp 98.7 F (37.1 C) (Oral)   Resp 15   Ht 5\' 8"  (1.727 m)   Wt 77.9 kg   SpO2 95%   BMI 26.11 kg/m     Awake, alert, oriented Speech fluent, appropriate LEs 3/5 diffusely, sensation intact UEs 2/5, sensitive to touch Drain intact Dressing c/d/i   ASSESSMENT:  78 y.o. with   -Central cord syndrome C3-5 s/p ACDF  PLAN: -Supportive care -Therapies as tolerated -Ok for Lovenox tmrw -Call w/ questions/concerns.   Patrici Ranks, [facility]
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
## 2023-09-10 NOTE — Progress Notes (Signed)
  Inpatient Rehab Admissions Coordinator :  Per therapy recommendations, patient was screened for CIR candidacy by Ottie Glazier RN MSN.  At this time patient appears to be a potential candidate for CIR. I will place a rehab consult per protocol for full assessment. Please call me with any questions.  Ottie Glazier RN MSN Admissions Coordinator [phone]

## 2023-09-10 NOTE — Care Management Important Message (Signed)
 Important Message  Patient Details  Name: Brandon Watts MRN: [id number] Date of Birth: 01/27/1946   Important Message Given:  Yes - Medicare IM     Sherilyn Banker 09/10/2023, 12:55 PM

## 2023-09-11 DIAGNOSIS — S065XAA Traumatic subdural hemorrhage with loss of consciousness status unknown, initial encounter: Secondary | ICD-10-CM | POA: Diagnosis not present

## 2023-09-11 MED ORDER — ENOXAPARIN SODIUM 40 MG/0.4ML IJ SOSY
40.0000 mg | PREFILLED_SYRINGE | INTRAMUSCULAR | Status: DC
  Administered 2023-09-11 – 2023-09-14 (×4): 40 mg via SUBCUTANEOUS
  Filled 2023-09-11 (×5): qty 0.4

## 2023-09-11 MED ORDER — LOSARTAN POTASSIUM 50 MG PO TABS
50.0000 mg | ORAL_TABLET | Freq: Every day | ORAL | Status: DC
  Administered 2023-09-11 – 2023-09-17 (×7): 50 mg via ORAL
  Filled 2023-09-11 (×7): qty 1

## 2023-09-11 MED ORDER — FINASTERIDE 5 MG PO TABS
5.0000 mg | ORAL_TABLET | Freq: Every day | ORAL | Status: DC
  Administered 2023-09-11 – 2023-09-16 (×6): 5 mg via ORAL
  Filled 2023-09-11 (×6): qty 1
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
## 2023-09-11 NOTE — Progress Notes (Signed)
 Subjective: Patient reports getting better increased movement in his legs increased movement in his arm severe dysesthesias  Objective: Vital signs in last 24 hours: Temp:  [98 F (36.7 C)-99.2 F (37.3 C)] 98.2 F (36.8 C) (03/01 0423) Pulse Rate:  [60-63] 63 (03/01 0423) Resp:  [16-20] 20 (03/01 0423) BP: (145-179)/(64-69) 179/67 (03/01 0423) SpO2:  [90 %-92 %] 91 % (03/01 0423)  Intake/Output from previous day: 02/28 0701 - 03/01 0700 In: 62 [I.V.:62] Out: 720 [Urine:700; Drains:20] Intake/Output this shift: No intake/output data recorded.  Awake and alert has got about 3 out of 5 grip strength was able to move his arms a little bit but not antigravity was not able to raise him over his head is able to raise his legs off the bed.  But pain and dysesthesia is factoring in and limiting his function.  Lab Results: Recent Labs    09/08/23 1245 09/08/23 1258 09/09/23 0512  WBC 7.8  --  11.8*  HGB 14.0 12.6* 12.3*  HCT 40.1 37.0* 35.9*  PLT 136*  --  121*   BMET Recent Labs    09/08/23 1245 09/08/23 1258 09/09/23 0512  NA 140 141 138  K 4.4 4.3 3.9  CL 108 111 107  CO2 20*  --  23  GLUCOSE 90 90 121*  BUN 29* 29* 31*  CREATININE 1.10 1.10 1.04  CALCIUM 9.9  --  9.3    Studies/Results: DG Cervical Spine 2 or 3 views Result Date: 09/09/2023 CLINICAL DATA:  Elective surgery EXAM: CERVICAL SPINE - 2-3 VIEW COMPARISON:  Cervical spine x-ray 07/12/2004 FINDINGS: Two intraoperative fluoroscopic views of the cervical spine. Anterior fusion plate and disc spacers are seen at C3, C4 and C5. Fluoroscopy time: 17 seconds. Fluoroscopy dose: 2.65 micro gray. IMPRESSION: Intraoperative fluoroscopic views of the cervical spine. Electronically Signed   By: Darliss Cheney M.D.   On: 09/09/2023 21:46   DG C-Arm 1-60 Min-No Report Result Date: 09/09/2023 Fluoroscopy was utilized by the requesting physician.  No radiographic interpretation.   DG C-Arm 1-60 Min-No Report Result Date:  09/09/2023 Fluoroscopy was utilized by the requesting physician.  No radiographic interpretation.   DG C-Arm 1-60 Min-No Report Result Date: 09/09/2023 Fluoroscopy was utilized by the requesting physician.  No radiographic interpretation.    Assessment/Plan: 78 year old status post anterior cervical for spinal cord injury making progress significant permanent from preop continue to mobilize with physical Occupational Therapy.  LOS: 3 days     Mariam Dollar 09/11/2023, 7:49 AM
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
## 2023-09-11 NOTE — Progress Notes (Signed)
 Inpatient Rehab Admissions Coordinator:   I met with pt. And wife at bedside to discuss potential CIR admit. 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. Rehab MD saw and felt Pt. Was a good candidate for CIR admit. I will open case with insurance and pursue for admit.   Megan Salon, MS, CCC-SLP Rehab Admissions Coordinator  [phone] (celll) [phone] (office)

## 2023-09-11 NOTE — Progress Notes (Signed)
 Transition of Care [facility]) - CAGE-AID Screening   Patient Details  Name: Brandon Watts MRN: [id number] Date of Birth: [birth year]  Transition of Care [facility] CM/SW Contact:    Katha Hamming, RN Phone Number: 09/11/2023, 8:06 PM   Clinical Narrative:  Reports drinking 2 drinks of liquor daily, denies hx of withdrawal s/s, declines resources.   CAGE-AID Screening:    Have You Ever Felt You Ought to Cut Down on Your Drinking or Drug Use?: No Have People Annoyed You By Critizing Your Drinking Or Drug Use?: No Have You Felt Bad Or Guilty About Your Drinking Or Drug Use?: No Have You Ever Had a Drink or Used Drugs First Thing In The Morning to Steady Your Nerves or to Get Rid of a Hangover?: No CAGE-AID Score: 0  Substance Abuse Education Offered: (S) No (Declined)

## 2023-09-12 DIAGNOSIS — S065XAA Traumatic subdural hemorrhage with loss of consciousness status unknown, initial encounter: Secondary | ICD-10-CM | POA: Diagnosis not present

## 2023-09-12 MED ORDER — ALPRAZOLAM 0.5 MG PO TABS
0.5000 mg | ORAL_TABLET | Freq: Two times a day (BID) | ORAL | Status: DC | PRN
  Administered 2023-09-12 (×2): 0.5 mg via ORAL
  Filled 2023-09-12 (×3): qty 1

## 2023-09-12 MED ORDER — ALPRAZOLAM 0.5 MG PO TABS
0.5000 mg | ORAL_TABLET | Freq: Once | ORAL | Status: AC
  Administered 2023-09-12: 0.5 mg via ORAL
  Filled 2023-09-12: qty 1

## 2023-09-12 MED ORDER — MELATONIN 5 MG PO TABS
5.0000 mg | ORAL_TABLET | Freq: Every day | ORAL | Status: DC
  Administered 2023-09-12 – 2023-09-16 (×5): 5 mg via ORAL
  Filled 2023-09-12 (×5): qty 1

## 2023-09-12 NOTE — Progress Notes (Signed)
 Pt still with coude catheter post surgery. Spoke at bedside with patient and Dr Wynetta Emery about catheter. Pt would like catheter to stay in until he is able to stand independently to use a urinal. Pt aware of risks of leaving catheter in for longer than intended (cauti) and Dr. Wynetta Emery okay with patient keeping catheter until pt is ready for it to be removed.

## 2023-09-12 NOTE — Progress Notes (Signed)
 Subjective: Patient reports doing ok, seems to be frustrated this morning with his lack of movement. States he's having trouble sleeping and a lot of anxiety   Objective: Vital signs in last 24 hours: Temp:  [97.7 F (36.5 C)-98.6 F (37 C)] 97.9 F (36.6 C) (03/02 0233) Pulse Rate:  [55-59] 59 (03/02 0233) Resp:  [13-17] 16 (03/02 0233) BP: (122-149)/(65-76) 137/76 (03/02 0233) SpO2:  [93 %-95 %] 95 % (03/02 0233)  Intake/Output from previous day: 03/01 0701 - 03/02 0700 In: 80 [P.O.:80] Out: 1225 [Urine:1225] Intake/Output this shift: No intake/output data recorded.  Neuro: arm strength unchanged, left grip 1/5  Lab Results: Lab Results  Component Value Date   WBC 11.8 (H) 09/09/2023   HGB 12.3 (L) 09/09/2023   HCT 35.9 (L) 09/09/2023   MCV 97.0 09/09/2023   PLT 121 (L) 09/09/2023   No results found for: "INR", "PROTIME" BMET Lab Results  Component Value Date   NA 138 09/09/2023   K 3.9 09/09/2023   CL 107 09/09/2023   CO2 23 09/09/2023   GLUCOSE 121 (H) 09/09/2023   BUN 31 (H) 09/09/2023   CREATININE 1.04 09/09/2023   CALCIUM 9.3 09/09/2023    Studies/Results: No results found.  Assessment/Plan: S/p acdf, work with therapy today. Work on anxiety medications with hospitalist    LOS: 4 days    Tiana Loft [facility] 09/12/2023, 7:47 AM
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
## 2023-09-12 NOTE — Plan of Care (Signed)
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
## 2023-09-13 DIAGNOSIS — S065XAA Traumatic subdural hemorrhage with loss of consciousness status unknown, initial encounter: Secondary | ICD-10-CM | POA: Diagnosis not present

## 2023-09-13 LAB — CBC
HCT: 36.1 % — ABNORMAL LOW (ref 39.0–52.0)
Hemoglobin: 12.7 g/dL — ABNORMAL LOW (ref 13.0–17.0)
MCH: 32.8 pg (ref 26.0–34.0)
MCHC: 35.2 g/dL (ref 30.0–36.0)
MCV: 93.3 fL (ref 80.0–100.0)
Platelets: 105 10*3/uL — ABNORMAL LOW (ref 150–400)
RBC: 3.87 MIL/uL — ABNORMAL LOW (ref 4.22–5.81)
RDW: 11.6 % (ref 11.5–15.5)
WBC: 7.5 10*3/uL (ref 4.0–10.5)
nRBC: 0 % (ref 0.0–0.2)

## 2023-09-13 MED ORDER — HYDROXYZINE HCL 10 MG PO TABS
10.0000 mg | ORAL_TABLET | Freq: Once | ORAL | Status: AC | PRN
  Administered 2023-09-14: 10 mg via ORAL
  Filled 2023-09-13: qty 1

## 2023-09-13 MED ORDER — ENSURE ENLIVE PO LIQD
237.0000 mL | Freq: Two times a day (BID) | ORAL | Status: DC
  Administered 2023-09-13 – 2023-09-16 (×2): 237 mL via ORAL

## 2023-09-13 MED ORDER — ALPRAZOLAM 0.5 MG PO TABS
1.0000 mg | ORAL_TABLET | Freq: Two times a day (BID) | ORAL | Status: DC | PRN
  Administered 2023-09-13 – 2023-09-16 (×6): 1 mg via ORAL
  Filled 2023-09-13 (×6): qty 2
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
## 2023-09-13 NOTE — Progress Notes (Signed)
 Inpatient Rehab Admissions Coordinator:    I received insurance authorization for CIR; however, Pt. Has developed delirium and is not yet ready to d/c to CIR. I will continue to follow for potential CIR admit once medically stable.  Megan Salon, MS, CCC-SLP Rehab Admissions Coordinator  [phone] (celll) [phone] (office)
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
## 2023-09-13 NOTE — Progress Notes (Addendum)
 Nutrition Brief Note Spoke to RN about patient. She reports he has not been eating very well due to pain and has developed delirium from not being able to sleep. On visit patient was attempting to sleep. Spoke to friend who is a former physician outside of room and he was ok with trying different supplements for him.   Interventions: Ensure Enlive po BID, each supplement provides 350 kcal and 20 grams of protein. Magic cup TID with meals, each supplement provides 290 kcal and 9 grams of protein cup  If nutrition issues arise, please consult RD.   Elliot Dally, RD Registered Dietitian  See Amion for more information

## 2023-09-13 NOTE — Progress Notes (Signed)
 Patient very restless, periods of confusion, attempting to get out of bed, antianxiety medication given with little effect. Denies pain , turn and repositioned several times. Remain alert and responsive.

## 2023-09-13 NOTE — Plan of Care (Signed)

## 2023-09-14 ENCOUNTER — Inpatient Hospital Stay (HOSPITAL_COMMUNITY)

## 2023-09-14 ENCOUNTER — Encounter (HOSPITAL_COMMUNITY): Payer: Self-pay | Admitting: Family Medicine

## 2023-09-14 DIAGNOSIS — J9601 Acute respiratory failure with hypoxia: Secondary | ICD-10-CM | POA: Diagnosis not present

## 2023-09-14 DIAGNOSIS — S065XAA Traumatic subdural hemorrhage with loss of consciousness status unknown, initial encounter: Secondary | ICD-10-CM | POA: Diagnosis not present

## 2023-09-14 DIAGNOSIS — R1310 Dysphagia, unspecified: Secondary | ICD-10-CM

## 2023-09-14 DIAGNOSIS — D649 Anemia, unspecified: Secondary | ICD-10-CM | POA: Diagnosis not present

## 2023-09-14 LAB — BASIC METABOLIC PANEL
Anion gap: 7 (ref 5–15)
BUN: 14 mg/dL (ref 8–23)
CO2: 26 mmol/L (ref 22–32)
Calcium: 9.9 mg/dL (ref 8.9–10.3)
Chloride: 99 mmol/L (ref 98–111)
Creatinine, Ser: 0.7 mg/dL (ref 0.61–1.24)
GFR, Estimated: >60 mL/min (ref >60)
Glucose, Bld: 107 mg/dL — ABNORMAL HIGH (ref 70–99)
Potassium: 4.3 mmol/L (ref 3.5–5.1)
Sodium: 132 mmol/L — ABNORMAL LOW (ref 135–145)

## 2023-09-14 LAB — BLOOD GAS, VENOUS
Acid-Base Excess: 6.3 mmol/L — ABNORMAL HIGH (ref 0.0–2.0)
Bicarbonate: 31.9 mmol/L — ABNORMAL HIGH (ref 20.0–28.0)
O2 Saturation: 73.9 %
Patient temperature: 36.5
pCO2, Ven: 47 mmHg (ref 44–60)
pH, Ven: 7.44 — ABNORMAL HIGH (ref 7.25–7.43)
pO2, Ven: 42 mmHg (ref 32–45)

## 2023-09-14 MED ORDER — GABAPENTIN 300 MG PO CAPS: 300.0000 mg | ORAL_CAPSULE | Freq: Three times a day (TID) | ORAL | 0 refills | Status: DC

## 2023-09-14 MED ORDER — IOHEXOL 350 MG/ML SOLN
75.0000 mL | Freq: Once | INTRAVENOUS | Status: AC | PRN
  Administered 2023-09-14: 75 mL via INTRAVENOUS

## 2023-09-14 MED ORDER — ENOXAPARIN SODIUM 80 MG/0.8ML IJ SOSY
80.0000 mg | PREFILLED_SYRINGE | Freq: Two times a day (BID) | INTRAMUSCULAR | Status: DC
  Administered 2023-09-14 – 2023-09-17 (×6): 80 mg via SUBCUTANEOUS
  Filled 2023-09-14 (×6): qty 0.8
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
## 2023-09-14 NOTE — Plan of Care (Signed)

## 2023-09-14 NOTE — Plan of Care (Signed)

## 2023-09-14 NOTE — Progress Notes (Signed)
 eLink Physician-Brief Progress Note Patient Name: Brandon Watts DOB: [birth year] MRN: [id number]   Date of Service  09/14/2023  HPI/Events of Note  Received a call from radiology. CTPA with evidence of segmental LUL PE with no sign of right heart strain. Also with infiltrates/atelectasis LLL.  Patient on prophylactic Lovenox  Patient is not in a camera capable room.  eICU Interventions  Discussed with BSRN Will need clearance from neurosurgery if Lovenox dose can be increased to treatment dose given recent spine surgery and presence of SDH. BSRN will inform neurosurgery. No clinical signs of infection as per bedside assessment, will hold off on antibiotics.     Intervention Category Intermediate Interventions: Diagnostic test evaluation  Darl Pikes 09/14/2023, 10:21 PM
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
## 2023-09-14 NOTE — Progress Notes (Addendum)
 Patient OTF to CT via transport  2150- Patient back on unit

## 2023-09-14 NOTE — Progress Notes (Signed)
 eLink Physician-Brief Progress Note Patient Name: Brandon Watts DOB: January 13, 1946 MRN: [id number]   Date of Service  09/14/2023  HPI/Events of Note  Received communication that patient is cleared by neurosurgery (Dr Esperanza Richters) for full dose anticoagulation  eICU Interventions  Pharmacy consulted for dosing and close monitoring.     Intervention Category Intermediate Interventions: Other:;Communication with other healthcare providers and/or family  Darl Pikes 09/14/2023, 11:08 PM

## 2023-09-14 NOTE — Discharge Summary (Signed)
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 Recent Labs  Lab 09/08/23 1245 09/08/23 1258 09/09/23 0512  NA 140 141 138  K 4.4 4.3 3.9  CL 108 111 107  CO2 20*  --  23  GLUCOSE 90 90 121*  BUN 29* 29* 31*  CREATININE 1.10 1.10 1.04  CALCIUM 9.9  --  9.3  MG  --   --  2.1  PHOS  --   --  3.4   Liver Function Tests: Recent Labs  Lab 09/08/23 1245 09/09/23 0512  AST 24 23  ALT 14 15  ALKPHOS 47 40  BILITOT 1.2 1.5*  PROT 5.4* 5.4*  ALBUMIN 3.2* 3.1*   No results for input(s): "LIPASE", "AMYLASE" in the last 168 hours. No results for input(s): "AMMONIA" in the last 168 hours. CBC: Recent Labs  Lab 09/08/23 1245 09/08/23 1258 09/09/23 0512 09/13/23 0320  WBC 7.8  --  11.8* 7.5  NEUTROABS 4.6  --   --   --   HGB 14.0 12.6* 12.3* 12.7*  HCT 40.1 37.0* 35.9* 36.1*  MCV 93.9  --  97.0 93.3  PLT 136*  --  121* 105*   Cardiac Enzymes: No results for input(s): "CKTOTAL", "CKMB", "CKMBINDEX", "TROPONINI" in the last 168 hours. BNP: Invalid input(s): "POCBNP" CBG: No results for input(s): "GLUCAP" in the last 168  hours. D-Dimer No results for input(s): "DDIMER" in the last 72 hours. Hgb A1c No results for input(s): "HGBA1C" in the last 72 hours. Lipid Profile No results for input(s): "CHOL", "HDL", "LDLCALC", "TRIG", "CHOLHDL", "LDLDIRECT" in the last 72 hours. Thyroid function studies No results for input(s): "TSH", "T4TOTAL", "T3FREE", "THYROIDAB" in the last 72 hours.  Invalid input(s): "FREET3" Anemia work up No results for input(s): "VITAMINB12", "FOLATE", "FERRITIN", "TIBC", "IRON", "RETICCTPCT" in the last 72 hours. Urinalysis    Component Value Date/Time   COLORURINE YELLOW 09/09/2023 0217   APPEARANCEUR CLEAR 09/09/2023 0217   LABSPEC >1.046 (H) 09/09/2023 0217   PHURINE 6.0 09/09/2023 0217   GLUCOSEU NEGATIVE 09/09/2023 0217   HGBUR MODERATE (A) 09/09/2023 0217   BILIRUBINUR NEGATIVE 09/09/2023 0217   KETONESUR 20 (A) 09/09/2023 0217   PROTEINUR NEGATIVE 09/09/2023 0217   NITRITE NEGATIVE 09/09/2023 0217   LEUKOCYTESUR NEGATIVE 09/09/2023 0217   Sepsis Labs Recent Labs  Lab 09/08/23 1245 09/09/23 0512 09/13/23 0320  WBC 7.8 11.8* 7.5   Microbiology Recent Results (from the past 240 hours)  Surgical pcr screen     Status: None   Collection Time: 09/08/23 10:58 PM   Specimen: Nasal Mucosa; Nasal Swab  Result Value Ref Range Status   MRSA, PCR NEGATIVE NEGATIVE Final   Staphylococcus  aureus NEGATIVE NEGATIVE Final    Comment: (NOTE) The Xpert SA Assay (FDA approved for NASAL specimens in patients 35 years of age and older), is one component of a comprehensive surveillance program. It is not intended to diagnose infection nor to guide or monitor treatment. Performed at [facility], [address], [place], [place] [address]      Time coordinating discharge: Over 30 minutes  SIGNED:   Willeen Niece, MD  [facility] 09/14/2023, 2:29 PM Pager   If 7PM-7AM, please contact night-coverage

## 2023-09-14 NOTE — Discharge Instructions (Signed)
 Advised to follow-up with primary care physician in 1 week. Advised to follow-up with neurosurgery as scheduled. Patient being discharged to acute inpatient rehab.

## 2023-09-14 NOTE — Progress Notes (Signed)
 Neurosurgery  Pt seen and examined.  Awake alert, mildly confused.  Speech dysarthric.  Respirations unlabored. Moving lower extremities well with 4/5, moving RUE 3/5, LUE 1-2/5z  - cont pulm toilet - chest X-ray - lovenox dvt ppx - likely good rehab candidate

## 2023-09-14 NOTE — Progress Notes (Signed)
 Inpatient Rehab Admissions Coordinator:   I have a CIR bed for this Pt. Today. RN may call report to [phone].   Pt. To admit to CIR for an estimated 18-21 days with the goal of discharging home with assistance of his wife and paid caregivers.   Megan Salon, MS, CCC-SLP Rehab Admissions Coordinator  [phone] (celll) [phone] (office)

## 2023-09-15 ENCOUNTER — Inpatient Hospital Stay (HOSPITAL_COMMUNITY)

## 2023-09-15 DIAGNOSIS — R0982 Postnasal drip: Secondary | ICD-10-CM

## 2023-09-15 DIAGNOSIS — I2699 Other pulmonary embolism without acute cor pulmonale: Secondary | ICD-10-CM

## 2023-09-15 DIAGNOSIS — R1310 Dysphagia, unspecified: Secondary | ICD-10-CM | POA: Diagnosis not present

## 2023-09-15 DIAGNOSIS — Z86711 Personal history of pulmonary embolism: Secondary | ICD-10-CM

## 2023-09-15 DIAGNOSIS — S065XAA Traumatic subdural hemorrhage with loss of consciousness status unknown, initial encounter: Secondary | ICD-10-CM | POA: Diagnosis not present

## 2023-09-15 DIAGNOSIS — J189 Pneumonia, unspecified organism: Secondary | ICD-10-CM

## 2023-09-15 DIAGNOSIS — J9601 Acute respiratory failure with hypoxia: Secondary | ICD-10-CM | POA: Diagnosis not present

## 2023-09-15 DIAGNOSIS — E440 Moderate protein-calorie malnutrition: Secondary | ICD-10-CM | POA: Insufficient documentation

## 2023-09-15 DIAGNOSIS — D649 Anemia, unspecified: Secondary | ICD-10-CM | POA: Diagnosis not present

## 2023-09-15 LAB — CBC
HCT: 39.4 % (ref 39.0–52.0)
Hemoglobin: 14.4 g/dL (ref 13.0–17.0)
MCH: 33.1 pg (ref 26.0–34.0)
MCHC: 36.5 g/dL — ABNORMAL HIGH (ref 30.0–36.0)
MCV: 90.6 fL (ref 80.0–100.0)
Platelets: 137 10*3/uL — ABNORMAL LOW (ref 150–400)
RBC: 4.35 MIL/uL (ref 4.22–5.81)
RDW: 11.4 % — ABNORMAL LOW (ref 11.5–15.5)
WBC: 11.9 10*3/uL — ABNORMAL HIGH (ref 4.0–10.5)
nRBC: 0 % (ref 0.0–0.2)

## 2023-09-15 LAB — MRSA NEXT GEN BY PCR, NASAL: MRSA by PCR Next Gen: NOT DETECTED

## 2023-09-15 MED ORDER — ALBUTEROL SULFATE (2.5 MG/3ML) 0.083% IN NEBU
2.5000 mg | INHALATION_SOLUTION | RESPIRATORY_TRACT | Status: DC
  Administered 2023-09-15: 2.5 mg via RESPIRATORY_TRACT
  Filled 2023-09-15: qty 3

## 2023-09-15 MED ORDER — PROSOURCE TF20 ENFIT COMPATIBL EN LIQD
60.0000 mL | Freq: Every day | ENTERAL | Status: DC
  Administered 2023-09-15 – 2023-09-17 (×3): 60 mL
  Filled 2023-09-15 (×3): qty 60

## 2023-09-15 MED ORDER — JEVITY 1.5 CAL/FIBER PO LIQD
1000.0000 mL | ORAL | Status: DC
  Filled 2023-09-15: qty 1000

## 2023-09-15 MED ORDER — THIAMINE MONONITRATE 100 MG PO TABS: 100.0000 mg | ORAL_TABLET | Freq: Every day | ORAL | Status: DC

## 2023-09-15 MED ORDER — VANCOMYCIN HCL IN DEXTROSE 1-5 GM/200ML-% IV SOLN: 1000.0000 mg | Freq: Two times a day (BID) | INTRAVENOUS | Status: DC

## 2023-09-15 MED ORDER — AZELASTINE HCL 0.1 % NA SOLN
1.0000 | Freq: Two times a day (BID) | NASAL | Status: DC
  Administered 2023-09-15 – 2023-09-17 (×4): 1 via NASAL
  Filled 2023-09-15: qty 30

## 2023-09-15 MED ORDER — SODIUM CHLORIDE 0.9 % IV SOLN
2.0000 g | Freq: Three times a day (TID) | INTRAVENOUS | Status: DC
  Administered 2023-09-15 – 2023-09-17 (×7): 2 g via INTRAVENOUS
  Filled 2023-09-15 (×8): qty 12.5

## 2023-09-15 MED ORDER — ADULT MULTIVITAMIN W/MINERALS CH: 1.0000 | ORAL_TABLET | Freq: Every day | ORAL | Status: DC

## 2023-09-15 MED ORDER — THIAMINE MONONITRATE 100 MG PO TABS
100.0000 mg | ORAL_TABLET | Freq: Every day | ORAL | Status: DC
  Administered 2023-09-16 – 2023-09-17 (×2): 100 mg via ORAL
  Filled 2023-09-15 (×2): qty 1

## 2023-09-15 MED ORDER — ALBUTEROL SULFATE (2.5 MG/3ML) 0.083% IN NEBU: 2.5000 mg | INHALATION_SOLUTION | RESPIRATORY_TRACT | Status: DC

## 2023-09-15 MED ORDER — ADULT MULTIVITAMIN W/MINERALS CH
1.0000 | ORAL_TABLET | Freq: Every day | ORAL | Status: DC
  Administered 2023-09-15: 1
  Filled 2023-09-15: qty 1

## 2023-09-15 MED ORDER — VANCOMYCIN HCL 1500 MG/300ML IV SOLN
1500.0000 mg | Freq: Once | INTRAVENOUS | Status: AC
  Administered 2023-09-15: 1500 mg via INTRAVENOUS
  Filled 2023-09-15: qty 300

## 2023-09-15 MED ORDER — THIAMINE MONONITRATE 100 MG PO TABS
100.0000 mg | ORAL_TABLET | Freq: Every day | ORAL | Status: DC
  Administered 2023-09-15: 100 mg
  Filled 2023-09-15: qty 1

## 2023-09-15 MED ORDER — ADULT MULTIVITAMIN W/MINERALS CH
1.0000 | ORAL_TABLET | Freq: Every day | ORAL | Status: DC
  Administered 2023-09-16 – 2023-09-17 (×2): 1 via ORAL
  Filled 2023-09-15 (×2): qty 1

## 2023-09-15 MED ORDER — OSMOLITE 1.5 CAL PO LIQD
1000.0000 mL | ORAL | Status: DC
  Administered 2023-09-15 – 2023-09-17 (×2): 1000 mL

## 2023-09-15 MED ORDER — GUAIFENESIN 100 MG/5ML PO LIQD
5.0000 mL | Freq: Four times a day (QID) | ORAL | Status: DC
  Administered 2023-09-15 – 2023-09-17 (×8): 5 mL via ORAL
  Filled 2023-09-15 (×8): qty 10
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
## 2023-09-15 NOTE — Progress Notes (Signed)
 Orthopedic Tech Progress Note Patient Details:  [name] February 26, 1946 [id number]  Called in order to [employer] for a [employer] [employer]  Patient ID: Brandon Watts, male   DOB: [birth year], 78 y.o.   MRN: [id number]  Donald Pore 09/15/2023, 5:55 PM

## 2023-09-15 NOTE — Progress Notes (Signed)
 Patient refused morning labs at this time. Garner Nash NP notified.

## 2023-09-15 NOTE — Progress Notes (Signed)
    Providing Compassionate, Quality Care - Together   NEUROSURGERY PROGRESS NOTE     S: L segmental PE dx'd o/n. Lovenox increased per primary team/pharmacy   O: EXAM:  BP (!) 154/89   Pulse 65   Temp (!) 97.4 F (36.3 C) (Oral)   Resp 20   Ht 5\' 8"  (1.727 m)   Wt 77.9 kg   SpO2 98%   BMI 26.11 kg/m     Awake, alert, oriented  Speech dysarthric Sitting at bedside w/ PT upon exam Collar in place   ASSESSMENT:  78 y.o. with   -Central cord syndrome C3-5 s/p ACDF     PLAN: -Continue Lovenox per primary team -CIR pending -Therapies as tolerated -Call w/ questions/concerns.   Patrici Ranks, [facility]
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
## 2023-09-15 NOTE — Progress Notes (Signed)
 Pharmacy Antibiotic Note  Brandon Watts is a 78 y.o. male admitted on 09/08/2023 with sepsis.  Pharmacy has been consulted for cefepime, vancomycin dosing.  Plan: Vancomycin 1500mg  IV x1 followed by vancomycin 1000mg  IV q12h (eAUC 467) Cefepime 2g IV q8h  F/u renal func, LOT, culture data  Height: 5\' 8"  (172.7 cm) Weight: 77.9 kg (171 lb 11.8 oz) IBW/kg (Calculated) : 68.4  Temp (24hrs), Avg:98.2 F (36.8 C), Min:97.4 F (36.3 C), Max:98.6 F (37 C)  Recent Labs  Lab 09/08/23 1245 09/08/23 1258 09/08/23 1259 09/09/23 0512 09/13/23 0320 09/14/23 1827  WBC 7.8  --   --  11.8* 7.5  --   CREATININE 1.10 1.10  --  1.04  --  0.70  LATICACIDVEN  --   --  2.4*  --   --   --     Estimated Creatinine Clearance: 76 mL/min (by C-G formula based on SCr of 0.7 mg/dL).    No Known Allergies  Antimicrobials this admission: Cefepime 3/5> Vanc 3/5>   Dose adjustments this admission:   Microbiology results: 2/26 MRSA, MSSA: neg   Thank you for allowing pharmacy to be a part of this patient's care.  Calton Dach, PharmD, BCCCP Clinical Pharmacist 09/15/2023 8:01 AM

## 2023-09-15 NOTE — Procedures (Signed)
 Cortrak  Person Inserting Tube:  Kendell Bane C, RD Tube Type:  Cortrak - 43 inches Tube Size:  10 Tube Location:  Left nare Initial Placement:  Stomach Secured by: Bridle Technique Used to Measure Tube Placement:  Marking at nare/corner of mouth Cortrak Secured At:  67 cm   Cortrak Tube Team Note:  Consult received to place a Cortrak feeding tube.   Abd x-ray required, please confirm placement prior to use. Abd xray ordered.   If the tube becomes dislodged please keep the tube and contact the Cortrak team at [email or web address] for replacement.  If after hours and replacement cannot be delayed, place a NG tube and confirm placement with an abdominal x-ray.    Cammy Copa., RD, LDN, CNSC See AMiON for contact information
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
## 2023-09-15 NOTE — Progress Notes (Signed)
 Inpatient Rehab Admissions Coordinator:   CIR following. Not yet stable for transfer. I will follow up for potential admit pending medical stability.   Megan Salon, MS, CCC-SLP Rehab Admissions Coordinator  [phone] (celll) [phone] (office)
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
## 2023-09-16 ENCOUNTER — Inpatient Hospital Stay (HOSPITAL_COMMUNITY)

## 2023-09-16 DIAGNOSIS — S065XAA Traumatic subdural hemorrhage with loss of consciousness status unknown, initial encounter: Secondary | ICD-10-CM | POA: Diagnosis not present

## 2023-09-16 LAB — BASIC METABOLIC PANEL
Anion gap: 9 (ref 5–15)
BUN: 19 mg/dL (ref 8–23)
CO2: 26 mmol/L (ref 22–32)
Calcium: 9.7 mg/dL (ref 8.9–10.3)
Chloride: 96 mmol/L — ABNORMAL LOW (ref 98–111)
Creatinine, Ser: 0.61 mg/dL (ref 0.61–1.24)
GFR, Estimated: >60 mL/min (ref >60)
Glucose, Bld: 138 mg/dL — ABNORMAL HIGH (ref 70–99)
Potassium: 3.6 mmol/L (ref 3.5–5.1)
Sodium: 131 mmol/L — ABNORMAL LOW (ref 135–145)

## 2023-09-16 LAB — POTASSIUM: Potassium: 3.5 mmol/L (ref 3.5–5.1)

## 2023-09-16 LAB — MAGNESIUM: Magnesium: 2.1 mg/dL (ref 1.7–2.4)

## 2023-09-16 LAB — PHOSPHORUS: Phosphorus: 2.7 mg/dL (ref 2.5–4.6)

## 2023-09-16 MED ORDER — BISACODYL 10 MG RE SUPP: 10.0000 mg | Freq: Every day | RECTAL | Status: DC | PRN

## 2023-09-16 MED ORDER — ALBUTEROL SULFATE (2.5 MG/3ML) 0.083% IN NEBU
2.5000 mg | INHALATION_SOLUTION | Freq: Three times a day (TID) | RESPIRATORY_TRACT | Status: DC
  Administered 2023-09-16 – 2023-09-17 (×3): 2.5 mg via RESPIRATORY_TRACT
  Filled 2023-09-16 (×5): qty 3
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
## 2023-09-16 NOTE — Progress Notes (Signed)
 IP rehab admissions - I met with patient's wife today.  I let her know that we would not have a CIR bed available today, but hopeful for a bed tomorrow.  I did call insurance carrier and [facility] is good through tomorrow.  I will have my partner follow up in am.  [phone]

## 2023-09-16 NOTE — Plan of Care (Signed)
 Patient ID: Brandon Watts, male   DOB: 1946-04-26, 78 y.o.   MRN: [id number]  Problem: Education: Goal: Knowledge of General Education information will improve Description: Including pain rating scale, medication(s)/side effects and non-pharmacologic comfort measures Outcome: Progressing   Problem: Health Behavior/Discharge Planning: Goal: Ability to manage health-related needs will improve Outcome: Progressing   Problem: Clinical Measurements: Goal: Ability to maintain clinical measurements within normal limits will improve Outcome: Progressing Goal: Will remain free from infection Outcome: Progressing Goal: Diagnostic test results will improve Outcome: Progressing Goal: Respiratory complications will improve Outcome: Progressing Goal: Cardiovascular complication will be avoided Outcome: Progressing   Problem: Activity: Goal: Risk for activity intolerance will decrease Outcome: Progressing   Problem: Nutrition: Goal: Adequate nutrition will be maintained Outcome: Progressing   Problem: Coping: Goal: Level of anxiety will decrease Outcome: Progressing   Problem: Elimination: Goal: Will not experience complications related to bowel motility Outcome: Progressing Goal: Will not experience complications related to urinary retention Outcome: Progressing   Problem: Pain Managment: Goal: General experience of comfort will improve and/or be controlled Outcome: Progressing   Problem: Safety: Goal: Ability to remain free from injury will improve Outcome: Progressing   Problem: Skin Integrity: Goal: Risk for impaired skin integrity will decrease Outcome: Progressing   Problem: Education: Goal: Ability to verbalize activity precautions or restrictions will improve Outcome: Progressing Goal: Knowledge of the prescribed therapeutic regimen will improve Outcome: Progressing Goal: Understanding of discharge needs will improve Outcome: Progressing   Problem:  Activity: Goal: Ability to avoid complications of mobility impairment will improve Outcome: Progressing Goal: Ability to tolerate increased activity will improve Outcome: Progressing Goal: Will remain free from falls Outcome: Progressing   Problem: Bowel/Gastric: Goal: Gastrointestinal status for postoperative course will improve Outcome: Progressing   Problem: Clinical Measurements: Goal: Ability to maintain clinical measurements within normal limits will improve Outcome: Progressing Goal: Postoperative complications will be avoided or minimized Outcome: Progressing Goal: Diagnostic test results will improve Outcome: Progressing   Problem: Pain Management: Goal: Pain level will decrease Outcome: Progressing   Problem: Skin Integrity: Goal: Will show signs of wound healing Outcome: Progressing   Problem: Health Behavior/Discharge Planning: Goal: Identification of resources available to assist in meeting health care needs will improve Outcome: Progressing   Problem: Bladder/Genitourinary: Goal: Urinary functional status for postoperative course will improve Outcome: Progressing     Lidia Collum, RN
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
## 2023-09-17 ENCOUNTER — Encounter (HOSPITAL_COMMUNITY): Payer: Self-pay | Admitting: Physical Medicine and Rehabilitation

## 2023-09-17 ENCOUNTER — Encounter: Payer: Self-pay | Admitting: Internal Medicine

## 2023-09-17 ENCOUNTER — Other Ambulatory Visit: Payer: Self-pay

## 2023-09-17 ENCOUNTER — Inpatient Hospital Stay (HOSPITAL_COMMUNITY)
Admission: AD | Admit: 2023-09-17 | Discharge: 2023-10-11 | DRG: 945 | Disposition: A | Discharge status: Home / Self Care | Source: Intra-hospital | Attending: Physical Medicine and Rehabilitation | Admitting: Physical Medicine and Rehabilitation

## 2023-09-17 DIAGNOSIS — S14129S Central cord syndrome at unspecified level of cervical spinal cord, sequela: Secondary | ICD-10-CM

## 2023-09-17 DIAGNOSIS — Z978 Presence of other specified devices: Secondary | ICD-10-CM

## 2023-09-17 DIAGNOSIS — S14129D Central cord syndrome at unspecified level of cervical spinal cord, subsequent encounter: Principal | ICD-10-CM

## 2023-09-17 DIAGNOSIS — N319 Neuromuscular dysfunction of bladder, unspecified: Secondary | ICD-10-CM | POA: Diagnosis not present

## 2023-09-17 DIAGNOSIS — S065XAD Traumatic subdural hemorrhage with loss of consciousness status unknown, subsequent encounter: Secondary | ICD-10-CM | POA: Diagnosis not present

## 2023-09-17 DIAGNOSIS — J9601 Acute respiratory failure with hypoxia: Secondary | ICD-10-CM | POA: Diagnosis present

## 2023-09-17 DIAGNOSIS — R0602 Shortness of breath: Secondary | ICD-10-CM | POA: Diagnosis not present

## 2023-09-17 DIAGNOSIS — R001 Bradycardia, unspecified: Secondary | ICD-10-CM | POA: Diagnosis not present

## 2023-09-17 DIAGNOSIS — F419 Anxiety disorder, unspecified: Secondary | ICD-10-CM | POA: Diagnosis present

## 2023-09-17 DIAGNOSIS — J9691 Respiratory failure, unspecified with hypoxia: Secondary | ICD-10-CM

## 2023-09-17 DIAGNOSIS — S065XAA Traumatic subdural hemorrhage with loss of consciousness status unknown, initial encounter: Secondary | ICD-10-CM | POA: Diagnosis not present

## 2023-09-17 DIAGNOSIS — S14129A Central cord syndrome at unspecified level of cervical spinal cord, initial encounter: Principal | ICD-10-CM

## 2023-09-17 DIAGNOSIS — I10 Essential (primary) hypertension: Secondary | ICD-10-CM | POA: Diagnosis not present

## 2023-09-17 DIAGNOSIS — Z8619 Personal history of other infectious and parasitic diseases: Secondary | ICD-10-CM | POA: Diagnosis not present

## 2023-09-17 DIAGNOSIS — R451 Restlessness and agitation: Secondary | ICD-10-CM | POA: Diagnosis not present

## 2023-09-17 DIAGNOSIS — F329 Major depressive disorder, single episode, unspecified: Secondary | ICD-10-CM | POA: Diagnosis present

## 2023-09-17 DIAGNOSIS — J9811 Atelectasis: Secondary | ICD-10-CM | POA: Diagnosis present

## 2023-09-17 DIAGNOSIS — R319 Hematuria, unspecified: Secondary | ICD-10-CM | POA: Diagnosis not present

## 2023-09-17 DIAGNOSIS — E871 Hypo-osmolality and hyponatremia: Secondary | ICD-10-CM | POA: Diagnosis present

## 2023-09-17 DIAGNOSIS — E860 Dehydration: Secondary | ICD-10-CM | POA: Diagnosis present

## 2023-09-17 DIAGNOSIS — Z79899 Other long term (current) drug therapy: Secondary | ICD-10-CM

## 2023-09-17 DIAGNOSIS — Z981 Arthrodesis status: Secondary | ICD-10-CM

## 2023-09-17 DIAGNOSIS — E785 Hyperlipidemia, unspecified: Secondary | ICD-10-CM | POA: Diagnosis present

## 2023-09-17 DIAGNOSIS — M7989 Other specified soft tissue disorders: Secondary | ICD-10-CM | POA: Diagnosis not present

## 2023-09-17 DIAGNOSIS — K592 Neurogenic bowel, not elsewhere classified: Secondary | ICD-10-CM | POA: Diagnosis present

## 2023-09-17 DIAGNOSIS — I951 Orthostatic hypotension: Secondary | ICD-10-CM | POA: Diagnosis present

## 2023-09-17 DIAGNOSIS — Z4682 Encounter for fitting and adjustment of non-vascular catheter: Secondary | ICD-10-CM | POA: Diagnosis not present

## 2023-09-17 DIAGNOSIS — S12391S Other nondisplaced fracture of fourth cervical vertebra, sequela: Secondary | ICD-10-CM | POA: Diagnosis not present

## 2023-09-17 DIAGNOSIS — M25512 Pain in left shoulder: Secondary | ICD-10-CM | POA: Diagnosis present

## 2023-09-17 DIAGNOSIS — G8252 Quadriplegia, C1-C4 incomplete: Principal | ICD-10-CM | POA: Diagnosis present

## 2023-09-17 DIAGNOSIS — R339 Retention of urine, unspecified: Secondary | ICD-10-CM | POA: Diagnosis not present

## 2023-09-17 DIAGNOSIS — J690 Pneumonitis due to inhalation of food and vomit: Secondary | ICD-10-CM | POA: Diagnosis present

## 2023-09-17 DIAGNOSIS — H409 Unspecified glaucoma: Secondary | ICD-10-CM | POA: Diagnosis not present

## 2023-09-17 DIAGNOSIS — Z96641 Presence of right artificial hip joint: Secondary | ICD-10-CM | POA: Diagnosis present

## 2023-09-17 DIAGNOSIS — S12300A Unspecified displaced fracture of fourth cervical vertebra, initial encounter for closed fracture: Secondary | ICD-10-CM | POA: Diagnosis present

## 2023-09-17 DIAGNOSIS — Z87891 Personal history of nicotine dependence: Secondary | ICD-10-CM

## 2023-09-17 DIAGNOSIS — M1990 Unspecified osteoarthritis, unspecified site: Secondary | ICD-10-CM | POA: Diagnosis not present

## 2023-09-17 DIAGNOSIS — Y9355 Activity, bike riding: Secondary | ICD-10-CM

## 2023-09-17 DIAGNOSIS — S12300D Unspecified displaced fracture of fourth cervical vertebra, subsequent encounter for fracture with routine healing: Secondary | ICD-10-CM | POA: Diagnosis not present

## 2023-09-17 DIAGNOSIS — R1310 Dysphagia, unspecified: Secondary | ICD-10-CM

## 2023-09-17 DIAGNOSIS — M792 Neuralgia and neuritis, unspecified: Secondary | ICD-10-CM | POA: Diagnosis not present

## 2023-09-17 DIAGNOSIS — M25511 Pain in right shoulder: Secondary | ICD-10-CM | POA: Diagnosis not present

## 2023-09-17 DIAGNOSIS — G4700 Insomnia, unspecified: Secondary | ICD-10-CM | POA: Diagnosis present

## 2023-09-17 DIAGNOSIS — R1312 Dysphagia, oropharyngeal phase: Secondary | ICD-10-CM | POA: Diagnosis not present

## 2023-09-17 DIAGNOSIS — S43002D Unspecified subluxation of left shoulder joint, subsequent encounter: Secondary | ICD-10-CM | POA: Diagnosis not present

## 2023-09-17 DIAGNOSIS — N400 Enlarged prostate without lower urinary tract symptoms: Secondary | ICD-10-CM | POA: Diagnosis present

## 2023-09-17 DIAGNOSIS — R253 Fasciculation: Secondary | ICD-10-CM | POA: Diagnosis not present

## 2023-09-17 DIAGNOSIS — I2699 Other pulmonary embolism without acute cor pulmonale: Secondary | ICD-10-CM | POA: Diagnosis present

## 2023-09-17 DIAGNOSIS — K5900 Constipation, unspecified: Secondary | ICD-10-CM | POA: Diagnosis present

## 2023-09-17 DIAGNOSIS — S12391A Other nondisplaced fracture of fourth cervical vertebra, initial encounter for closed fracture: Secondary | ICD-10-CM

## 2023-09-17 DIAGNOSIS — R443 Hallucinations, unspecified: Secondary | ICD-10-CM | POA: Diagnosis present

## 2023-09-17 LAB — BASIC METABOLIC PANEL
Anion gap: 6 (ref 5–15)
BUN: 20 mg/dL (ref 8–23)
CO2: 29 mmol/L (ref 22–32)
Calcium: 9.7 mg/dL (ref 8.9–10.3)
Chloride: 98 mmol/L (ref 98–111)
Creatinine, Ser: 0.62 mg/dL (ref 0.61–1.24)
GFR, Estimated: >60 mL/min (ref >60)
Glucose, Bld: 123 mg/dL — ABNORMAL HIGH (ref 70–99)
Potassium: 4 mmol/L (ref 3.5–5.1)
Sodium: 133 mmol/L — ABNORMAL LOW (ref 135–145)

## 2023-09-17 LAB — MAGNESIUM: Magnesium: 2.2 mg/dL (ref 1.7–2.4)

## 2023-09-17 MED ORDER — GUAIFENESIN 100 MG/5ML PO LIQD
5.0000 mL | Freq: Four times a day (QID) | ORAL | Status: AC
  Administered 2023-09-17 – 2023-09-18 (×4): 5 mL via ORAL
  Filled 2023-09-17 (×4): qty 5

## 2023-09-17 MED ORDER — QUETIAPINE FUMARATE 25 MG PO TABS: 12.5000 mg | ORAL_TABLET | Freq: Two times a day (BID) | ORAL | Status: DC | PRN

## 2023-09-17 MED ORDER — GUAIFENESIN 100 MG/5ML PO LIQD: 5.0000 mL | Freq: Four times a day (QID) | ORAL | Status: DC

## 2023-09-17 MED ORDER — PROSOURCE TF20 ENFIT COMPATIBL EN LIQD
60.0000 mL | Freq: Every day | ENTERAL | Status: DC
  Administered 2023-09-17 – 2023-09-23 (×7): 60 mL
  Filled 2023-09-17 (×9): qty 60

## 2023-09-17 MED ORDER — OSMOLITE 1.5 CAL PO LIQD: 1000.0000 mL | ORAL | Status: DC

## 2023-09-17 MED ORDER — ALBUTEROL SULFATE (2.5 MG/3ML) 0.083% IN NEBU
2.5000 mg | INHALATION_SOLUTION | Freq: Three times a day (TID) | RESPIRATORY_TRACT | Status: DC
  Administered 2023-09-17 – 2023-09-20 (×8): 2.5 mg via RESPIRATORY_TRACT
  Filled 2023-09-17 (×8): qty 3

## 2023-09-17 MED ORDER — GABAPENTIN 300 MG PO CAPS
300.0000 mg | ORAL_CAPSULE | Freq: Three times a day (TID) | ORAL | Status: DC
  Administered 2023-09-17 – 2023-09-20 (×8): 300 mg via ORAL
  Filled 2023-09-17 (×8): qty 1

## 2023-09-17 MED ORDER — ONDANSETRON HCL 4 MG PO TABS: 4.0000 mg | ORAL_TABLET | Freq: Four times a day (QID) | ORAL | Status: DC | PRN

## 2023-09-17 MED ORDER — SENNA 8.6 MG PO TABS: 1.0000 | ORAL_TABLET | Freq: Two times a day (BID) | ORAL | Status: DC

## 2023-09-17 MED ORDER — METHOCARBAMOL 500 MG PO TABS
500.0000 mg | ORAL_TABLET | Freq: Four times a day (QID) | ORAL | Status: DC | PRN
  Administered 2023-09-17 (×2): 500 mg via ORAL
  Filled 2023-09-17 (×2): qty 1

## 2023-09-17 MED ORDER — MELATONIN 5 MG PO TABS
5.0000 mg | ORAL_TABLET | Freq: Every day | ORAL | Status: DC
  Administered 2023-09-17 – 2023-10-10 (×24): 5 mg via ORAL
  Filled 2023-09-17 (×24): qty 1

## 2023-09-17 MED ORDER — SODIUM CHLORIDE 0.9 % IV SOLN: 2.0000 g | Freq: Three times a day (TID) | INTRAVENOUS | Status: DC

## 2023-09-17 MED ORDER — POLYETHYLENE GLYCOL 3350 17 G PO PACK: 17.0000 g | PACK | Freq: Every day | ORAL | Status: DC | PRN

## 2023-09-17 MED ORDER — LOSARTAN POTASSIUM 50 MG PO TABS
50.0000 mg | ORAL_TABLET | Freq: Every day | ORAL | Status: DC
  Administered 2023-09-18 – 2023-09-21 (×4): 50 mg via ORAL
  Filled 2023-09-17 (×4): qty 1

## 2023-09-17 MED ORDER — PROSOURCE TF20 ENFIT COMPATIBL EN LIQD: 60.0000 mL | Freq: Every day | ENTERAL | Status: DC

## 2023-09-17 MED ORDER — TRAZODONE HCL 50 MG PO TABS: 50.0000 mg | ORAL_TABLET | Freq: Every evening | ORAL | Status: DC | PRN

## 2023-09-17 MED ORDER — DOCUSATE SODIUM 100 MG PO CAPS: 100.0000 mg | ORAL_CAPSULE | Freq: Two times a day (BID) | ORAL | Status: DC

## 2023-09-17 MED ORDER — ALBUTEROL SULFATE (2.5 MG/3ML) 0.083% IN NEBU: 2.5000 mg | INHALATION_SOLUTION | Freq: Three times a day (TID) | RESPIRATORY_TRACT | Status: DC

## 2023-09-17 MED ORDER — FINASTERIDE 5 MG PO TABS
5.0000 mg | ORAL_TABLET | Freq: Every day | ORAL | Status: DC
  Administered 2023-09-17 – 2023-10-10 (×24): 5 mg via ORAL
  Filled 2023-09-17 (×24): qty 1

## 2023-09-17 MED ORDER — AZELASTINE HCL 0.1 % NA SOLN
1.0000 | Freq: Two times a day (BID) | NASAL | Status: DC
  Administered 2023-09-17 – 2023-10-11 (×42): 1 via NASAL
  Filled 2023-09-17: qty 30

## 2023-09-17 MED ORDER — ACETAMINOPHEN 650 MG RE SUPP: 650.0000 mg | Freq: Four times a day (QID) | RECTAL | Status: DC | PRN

## 2023-09-17 MED ORDER — ONDANSETRON HCL 4 MG/2ML IJ SOLN: 4.0000 mg | Freq: Four times a day (QID) | INTRAMUSCULAR | Status: DC | PRN

## 2023-09-17 MED ORDER — MELATONIN 5 MG PO TABS: 5.0000 mg | ORAL_TABLET | Freq: Every day | ORAL | Status: DC

## 2023-09-17 MED ORDER — OXYCODONE HCL ER 10 MG PO T12A
10.0000 mg | EXTENDED_RELEASE_TABLET | Freq: Two times a day (BID) | ORAL | Status: DC
  Administered 2023-09-17 – 2023-10-11 (×48): 10 mg via ORAL
  Filled 2023-09-17 (×48): qty 1

## 2023-09-17 MED ORDER — OXYCODONE HCL ER 10 MG PO T12A
10.0000 mg | EXTENDED_RELEASE_TABLET | Freq: Two times a day (BID) | ORAL | Status: DC
Start: 2023-09-17 — End: 2023-10-11

## 2023-09-17 MED ORDER — POLYETHYLENE GLYCOL 3350 17 G PO PACK
17.0000 g | PACK | Freq: Two times a day (BID) | ORAL | Status: DC
  Administered 2023-09-17 – 2023-10-04 (×27): 17 g via ORAL
  Filled 2023-09-17 (×35): qty 1

## 2023-09-17 MED ORDER — VITAMIN B-1 100 MG PO TABS: 100.0000 mg | ORAL_TABLET | Freq: Every day | ORAL | Status: DC

## 2023-09-17 MED ORDER — BIOTENE DRY MOUTH MT LIQD: 15.0000 mL | OROMUCOSAL | Status: DC | PRN

## 2023-09-17 MED ORDER — ACETAMINOPHEN 325 MG PO TABS
650.0000 mg | ORAL_TABLET | Freq: Four times a day (QID) | ORAL | Status: DC | PRN
  Administered 2023-09-18 – 2023-09-21 (×2): 650 mg via ORAL
  Filled 2023-09-17: qty 2

## 2023-09-17 MED ORDER — ENOXAPARIN SODIUM 80 MG/0.8ML IJ SOSY: 80.0000 mg | PREFILLED_SYRINGE | Freq: Two times a day (BID) | INTRAMUSCULAR | Status: DC

## 2023-09-17 MED ORDER — TRAZODONE HCL 50 MG PO TABS
25.0000 mg | ORAL_TABLET | Freq: Every evening | ORAL | Status: DC | PRN
  Administered 2023-09-17 – 2023-09-18 (×2): 50 mg via ORAL
  Filled 2023-09-17 (×2): qty 1

## 2023-09-17 MED ORDER — CHLORHEXIDINE GLUCONATE CLOTH 2 % EX PADS
6.0000 | MEDICATED_PAD | Freq: Every day | CUTANEOUS | Status: DC
  Administered 2023-09-18 – 2023-09-20 (×4): 6 via TOPICAL

## 2023-09-17 MED ORDER — SODIUM CHLORIDE 0.9 % IV SOLN
2.0000 g | Freq: Three times a day (TID) | INTRAVENOUS | Status: AC
  Administered 2023-09-17 – 2023-09-21 (×11): 2 g via INTRAVENOUS
  Filled 2023-09-17 (×12): qty 12.5

## 2023-09-17 MED ORDER — AZELASTINE HCL 0.1 % NA SOLN: 1.0000 | Freq: Two times a day (BID) | NASAL | Status: DC

## 2023-09-17 MED ORDER — ENSURE ENLIVE PO LIQD
237.0000 mL | Freq: Two times a day (BID) | ORAL | Status: DC
  Administered 2023-09-18 – 2023-09-20 (×5): 237 mL via ORAL

## 2023-09-17 MED ORDER — SENNOSIDES-DOCUSATE SODIUM 8.6-50 MG PO TABS
2.0000 | ORAL_TABLET | Freq: Two times a day (BID) | ORAL | Status: DC
  Administered 2023-09-17 – 2023-10-11 (×44): 2 via ORAL
  Filled 2023-09-17 (×48): qty 2

## 2023-09-17 MED ORDER — THIAMINE MONONITRATE 100 MG PO TABS
100.0000 mg | ORAL_TABLET | Freq: Every day | ORAL | Status: AC
  Administered 2023-09-18 – 2023-09-19 (×2): 100 mg via ORAL
  Filled 2023-09-17 (×2): qty 1

## 2023-09-17 MED ORDER — ADULT MULTIVITAMIN W/MINERALS CH
1.0000 | ORAL_TABLET | Freq: Every day | ORAL | Status: DC
  Administered 2023-09-18 – 2023-10-03 (×9): 1 via ORAL
  Filled 2023-09-17 (×18): qty 1

## 2023-09-17 MED ORDER — SORBITOL 70 % SOLN
30.0000 mL | Freq: Every day | Status: DC | PRN
  Filled 2023-09-17: qty 30

## 2023-09-17 MED ORDER — TRAMADOL HCL 50 MG PO TABS
50.0000 mg | ORAL_TABLET | Freq: Four times a day (QID) | ORAL | Status: DC | PRN
  Administered 2023-09-17 – 2023-09-24 (×10): 50 mg via ORAL
  Filled 2023-09-17 (×14): qty 1

## 2023-09-17 MED ORDER — ENOXAPARIN SODIUM 80 MG/0.8ML IJ SOSY
80.0000 mg | PREFILLED_SYRINGE | Freq: Two times a day (BID) | INTRAMUSCULAR | Status: DC
  Administered 2023-09-17 – 2023-09-21 (×9): 80 mg via SUBCUTANEOUS
  Filled 2023-09-17 (×10): qty 0.8

## 2023-09-17 MED ORDER — FLEET ENEMA RE ENEM: 1.0000 | ENEMA | Freq: Every day | RECTAL | Status: DC | PRN

## 2023-09-17 MED ORDER — OSMOLITE 1.5 CAL PO LIQD
1000.0000 mL | ORAL | Status: DC
  Administered 2023-09-17: 1000 mL

## 2023-09-17 MED ORDER — BISACODYL 10 MG RE SUPP: 10.0000 mg | Freq: Every day | RECTAL | Status: DC | PRN
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
## 2023-09-17 NOTE — Progress Notes (Signed)
 Inpatient Rehab Admissions Coordinator:  There is a bed available for pt in CIR today. Dr. Hanley Ben is aware and in agreement. Pt, pt's wife Natalia Leatherwood, [place] and NSG made aware.   Wolfgang Phoenix, MS, CCC-SLP Admissions Coordinator [phone]
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
## 2023-09-18 ENCOUNTER — Encounter (HOSPITAL_COMMUNITY)

## 2023-09-18 ENCOUNTER — Inpatient Hospital Stay (HOSPITAL_COMMUNITY)

## 2023-09-18 DIAGNOSIS — G8252 Quadriplegia, C1-C4 incomplete: Secondary | ICD-10-CM | POA: Diagnosis not present

## 2023-09-18 MED ORDER — SORBITOL 70 % SOLN
45.0000 mL | Freq: Once | Status: AC
  Administered 2023-09-18: 45 mL via ORAL
  Filled 2023-09-18: qty 60

## 2023-09-18 MED ORDER — OSMOLITE 1.5 CAL PO LIQD
1300.0000 mL | ORAL | Status: DC
  Administered 2023-09-18 – 2023-09-21 (×3): 1300 mL
  Filled 2023-09-18 (×2): qty 1000

## 2023-09-18 MED ORDER — SODIUM CHLORIDE 0.9 % IV SOLN: INTRAVENOUS | Status: DC

## 2023-09-18 MED ORDER — GUAIFENESIN-DM 100-10 MG/5ML PO SYRP
10.0000 mL | ORAL_SOLUTION | ORAL | Status: DC | PRN
  Administered 2023-09-18 – 2023-09-22 (×3): 10 mL via ORAL
  Filled 2023-09-18 (×3): qty 10
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
## 2023-09-18 NOTE — Progress Notes (Signed)
 Nurse walked into room and observed that patients front of back brace was off and faily was shaving patient. Family also requested to take back of beck brace off with nurse stabilizing so they could shave the hair on back of patients neck. Patient asked about lab results, nurse looked up creatinine and results from the day. 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 Patients family wanted to change dressing, nurse educated  on surgical dressing to remain intact at this time. Call light within reach.
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
## 2023-09-18 NOTE — Progress Notes (Signed)
 Son and daughter present. Son shaved patients face and trimmed back of head. Tolerated well.

## 2023-09-18 NOTE — Plan of Care (Signed)
  Problem: RH Balance Goal: LTG: Patient will maintain dynamic sitting balance (OT) Description: LTG:  Patient will maintain dynamic sitting balance with assistance during activities of daily living (OT) Flowsheets (Taken 09/18/2023 1611) LTG: Pt will maintain dynamic sitting balance during ADLs with: Independent with assistive device   Problem: Sit to Stand Goal: LTG:  Patient will perform sit to stand in prep for activites of daily living with assistance level (OT) Description: LTG:  Patient will perform sit to stand in prep for activites of daily living with assistance level (OT) Flowsheets (Taken 09/18/2023 1611) LTG: PT will perform sit to stand in prep for activites of daily living with assistance level: Contact Guard/Touching assist   Problem: RH Eating Goal: LTG Patient will perform eating w/assist, cues/equip (OT) Description: LTG: Patient will perform eating with assist, with/without cues using equipment (OT) Flowsheets (Taken 09/18/2023 1612) LTG: Pt will perform eating with assistance level of: Independent with assistive device    Problem: RH Grooming Goal: LTG Patient will perform grooming w/assist,cues/equip (OT) Description: LTG: Patient will perform grooming with assist, with/without cues using equipment (OT) Flowsheets (Taken 09/18/2023 1612) LTG: Pt will perform grooming with assistance level of: Independent with assistive device    Problem: RH Bathing Goal: LTG Patient will bathe all body parts with assist levels (OT) Description: LTG: Patient will bathe all body parts with assist levels (OT) Flowsheets (Taken 09/18/2023 1613) LTG: Pt will perform bathing with assistance level/cueing: Minimal Assistance - Patient > 75%   Problem: RH Dressing Goal: LTG Patient will perform upper body dressing (OT) Description: LTG Patient will perform upper body dressing with assist, with/without cues (OT). Flowsheets (Taken 09/18/2023 1613) LTG: Pt will perform upper body dressing with  assistance level of: Minimal Assistance - Patient > 75% Goal: LTG Patient will perform lower body dressing w/assist (OT) Description: LTG: Patient will perform lower body dressing with assist, with/without cues in positioning using equipment (OT) Flowsheets (Taken 09/18/2023 1613) LTG: Pt will perform lower body dressing with assistance level of: Minimal Assistance - Patient > 75%   Problem: RH Toileting Goal: LTG Patient will perform toileting task (3/3 steps) with assistance level (OT) Description: LTG: Patient will perform toileting task (3/3 steps) with assistance level (OT)  Flowsheets (Taken 09/18/2023 1613) LTG: Pt will perform toileting task (3/3 steps) with assistance level: Minimal Assistance - Patient > 75%   Problem: RH Functional Use of Upper Extremity Goal: LTG Patient will use RT/LT upper extremity as a (OT) Description: LTG: Patient will use right/left upper extremity as a stabilizer/gross assist/diminished/nondominant/dominant level with assist, with/without cues during functional activity (OT) Flowsheets (Taken 09/18/2023 1613) LTG: Use of upper extremity in functional activities:  RUE as diminished level  LUE as gross assist level LTG: Pt will use upper extremity in functional activity with assistance level of: Independent with assistive device   Problem: RH Toilet Transfers Goal: LTG Patient will perform toilet transfers w/assist (OT) Description: LTG: Patient will perform toilet transfers with assist, with/without cues using equipment (OT) Flowsheets (Taken 09/18/2023 1613) LTG: Pt will perform toilet transfers with assistance level of: Minimal Assistance - Patient > 75%   Problem: RH Tub/Shower Transfers Goal: LTG Patient will perform tub/shower transfers w/assist (OT) Description: LTG: Patient will perform tub/shower transfers with assist, with/without cues using equipment (OT) Flowsheets (Taken 09/18/2023 1613) LTG: Pt will perform tub/shower stall transfers with  assistance level of: Minimal Assistance - Patient > 75%

## 2023-09-18 NOTE — Progress Notes (Addendum)
 PROGRESS NOTE   Subjective/Complaints:  Wants O2 with good sats- was no wearing O2 this AM Said breathing was going well until just now when had to sotp dressing due to SOB/DOE- when felt like a pill "went down wrong"- took meds crushed in applesauce this AM.   York Spaniel thinks has aspiration pneumonia- which is what the chart shows.  LBM at admission per wife-   Concerned not drinking enough- really wants IVFs- we discussed via Cortrak, but wants IVFs since getting Cefepime.   Also doesn't want to remove Foley until "can stand"- explained due to risk of CAUTI, the longest I can continue Foley is 2 days.   ROS:  Pt denies SOB, abd pain, CP, N/V/C/D, and vision changes  Pt doesn't want labs today  Objective:   DG CHEST PORT 1 VIEW Result Date: 09/16/2023 CLINICAL DATA:  Shortness of breath EXAM: PORTABLE CHEST 1 VIEW COMPARISON:  09/14/2023 FINDINGS: Feeding tube coils in the fundus of the stomach. Heart mediastinal contours within normal limits. Patchy bilateral lower lung opacities could reflect atelectasis or infiltrate. No effusions. No acute bony abnormality. IMPRESSION: Bilateral lower lobe opacities slightly increased since prior study, atelectasis versus infiltrates. Electronically Signed   By: Charlett Nose M.D.   On: 09/16/2023 11:56   No results for input(s): "WBC", "HGB", "HCT", "PLT" in the last 72 hours. Recent Labs    09/16/23 0548 09/17/23 0522  NA 131* 133*  K 3.6  3.5 4.0  CL 96* 98  CO2 26 29  GLUCOSE 138* 123*  BUN 19 20  CREATININE 0.61 0.62  CALCIUM 9.7 9.7    Intake/Output Summary (Last 24 hours) at 09/18/2023 1029 Last data filed at 09/18/2023 0655 Gross per 24 hour  Intake 593.33 ml  Output 1350 ml  Net -756.67 ml        Physical Exam: Vital Signs Blood pressure 118/70, pulse 60, temperature 98.4 F (36.9 C), temperature source Oral, resp. rate 16, height 5\' 8"  (1.727 m), weight 77.9 kg, SpO2  97%.    General: awake, alert, appropriate,  more attentive to conversation- not falling asleep anymore; NAD HENT: conjugate gaze; oropharynx dry- cortrak in place CV: regular rhythm, borderline bradycardic ; no JVD Pulmonary: decreased at bases, but no W/R/R heard GI: more firm; somewhat distended; hypoactive BS Psychiatric: flat affect;  Neurological: alert Skin: C/D/I. No apparent lesions.IV intact   MSK:      No apparent deformity. R shoulder AROM in abduction limited to <90 degrees. Non-painful.        Neurologic exam:  Cognition: AAO to person, place, time and event.  Language: Fluent, Mild dysarthria. Names 3/3 objects correctly.  Memory: Recalls 2/3 objects at 5 minutes.  Insight: Fair insight into current condition.  Psych: Pleasant affect, appropriate mood. + Hallucinations of "bugs crawling on the walls"; patient with good insight that these were not real   Sensation: To light touch hypersensitive in LUE; otherwise normal Reflexes :Negative Hoffman's and babinski signs bilaterally.  CN: 2-12 grossly intact.  Coordination: No apparent tremors. No ataxia on L FTN; + Mild LLE HTS Spasticity: MAS 0 in all extremities. Flaccid LUE.       Strength:  RUE: 5/5 SA, 5/5 EF, 5/5 EE, 5/5 WE, 5/5 FF, 5/5 FA                LUE:  1/5 SA, 2/5 EF, 1/5 EE, 2/5 WE, 3/5 FF, 2/5 FA                RLE: 5/5 HF, 5/5 KE, 5/5  DF, 5/5  EHL, 5/5  PF                 LLE:  5/5 HF, 5/5 KE, 5/5  DF, 5/5  EHL, 5/5  PF    Assessment/Plan: 1. Functional deficits which require 3+ hours per day of interdisciplinary therapy in a comprehensive inpatient rehab setting. Physiatrist is providing close team supervision and 24 hour management of active medical problems listed below. Physiatrist and rehab team continue to assess barriers to discharge/monitor patient progress toward functional and medical goals  Care Tool:  Bathing    Body parts bathed by patient: Chest, Abdomen   Body parts  bathed by helper: Buttocks, Right lower leg, Left lower leg, Right arm, Left arm, Front perineal area, Right upper leg, Left upper leg, Face     Bathing assist Assist Level: Maximal Assistance - Patient 24 - 49%     Upper Body Dressing/Undressing Upper body dressing   What is the patient wearing?: Pull over shirt    Upper body assist Assist Level: Maximal Assistance - Patient 25 - 49%    Lower Body Dressing/Undressing Lower body dressing      What is the patient wearing?: Pants     Lower body assist Assist for lower body dressing: Total Assistance - Patient < 25%     Toileting Toileting    Toileting assist Assist for toileting: Total Assistance - Patient < 25%     Transfers Chair/bed transfer  Transfers assist           Locomotion Ambulation   Ambulation assist              Walk 10 feet activity   Assist           Walk 50 feet activity   Assist           Walk 150 feet activity   Assist           Walk 10 feet on uneven surface  activity   Assist           Wheelchair     Assist               Wheelchair 50 feet with 2 turns activity    Assist            Wheelchair 150 feet activity     Assist          Blood pressure 118/70, pulse 60, temperature 98.4 F (36.9 C), temperature source Oral, resp. rate 16, height 5\' 8"  (1.727 m), weight 77.9 kg, SpO2 97%.   Medical Problem List and Plan: 1. Functional deficits due to SCI (central cord syndrome)- C4 ASIA C/D (incomplete quadriplegia)  with C4 fracture s/p arthrodesis C3-4 anterior interbody technique discectomy for decompression of spinal cord and exiting nerve roots with foraminotomies as well as additional level C4-5 interbody technique for decompression placement of anterior instrumentation consisting of interbody plate and screws C3-4-5 09/09/2023.  Cervical collar at all times             -patient may not shower             -  ELOS/Goals: 18 to  21 days, min assist PT/OT/SLP             -Stable to admit to inpatient rehab              -On admission Dr. Maisie Fus cleared patient for soft collar when in bed for comfort; ortho tech order placed for soft collar and nursing order for soft collar in bed, alternating to hard collar when OOB   First day of evaluations- con't CIR PT and OT  - will allow dog to come in from home with papers  - not ready for grounds pass quite yet  -will wait on Estim for 3 days to get complete eval done first- but did d/w pt using estim once we get handle on his function- using Stedy today to transfer 2.  Antithrombotics: -DVT/anticoagulation/segmental LUL PE:  Pharmaceutical: Lovenox initiated 09/11/2023 and titrated.  Neurosurgery Dr. Maisie Fus stating can switch to DOAC 3/7, since last hemoglobin 3/5 will wait until a.m. labs and switch if stable 3/8- pt refused labs this AM- will wait til Monday when have labs             -antiplatelet therapy: N/A   3. Pain Management: OxyContin sustained release 10 mg every 12 hours, Neurontin 300 mg 3 times daily Robaxin as needed.  Will add tramadol as needed   3/8- pt reports pain usually controlled- con't regimen 4. Mood/Behavior/Sleep/Hallucinations: Melatonin 5 mg nightly.  Xanax Dced on admission per family d/t confusion.              - antipsychotic agents: Seroquel 12.5-25 mg PRN              - Delirium precautions with stimulation during the daytime, quiet with low lights an minimal interruptions at nighttime, limit lines/tubes/drains to essential access only, limit physical and chemical restraints              - Add sleep log  - Melatonin 5 mg at bedtime, trazodone 25-50 mg PRN.  - May benefit from standing at bedtime seroquel if trazodone ineffective.    3/8- slept OK- will con't regimen for now 5. Neuropsych/cognition/small SDH: This patient is not capable of making decisions on his own behalf.              - telesitter continued    6. Skin/Wound Care: Routine skin  checks 7. Fluids/Electrolytes/Nutrition/hyponatremia: Routine in and outs with follow-up chemistries 8.  Dysphagia. Cortrak placed 3/5.   Currently on a mechanical soft diet with MBS pending Advance diet as tolerated per speech therapy              - On TF with 50 ml/hr osmolite 1.5 + prosource 60 ml daily              - Dietary consulted for management   3/8- will add IVFs, pt insistent- for dehydration- refused labs today so cannot see Cr/BUN to assess- but says not drinking enough 9.  Hypertension.  Cozaar 50 mg daily.  Monitor with increased mobility.              - Holding home hydrochlorothiazide    3/8- BP 110s- a little soft- monitor for Orthostatic hypotension 10.  BPH.  Proscar 5 mg nightly.  Check PVR             - Has coude catheter placed 2/27, patient hesitant to remove, OK to keep tonight and would re-address in AM - states he needs to  be able to stand to urinate d/t BPH   3/8- explained can wait 2 days, but then have to remove foley-  11.  Constipation.   No BM since admission               - Miralax 17 g BID, sennakot S 2 tabs BID              - PRN sorbitol for moderate constipation, PRN fleet enema for severe constipation   3/8- will give 45 cc Sorbitol and if no results, Soap suds enema this evening 12.  Acute respiratory failure with hypoxia/bilateral pneumonia.  Continue cefepime x 7 days.  Pulmonary services have signed off.              - Ongoing significant secretions with frequent suctioning              - Robitussin 5 mg Q6H             - Biotene PRN   3/8- running Cefepime now- con't 7 days total  13. R shoulder weakness.  No fracture on xray 2/26.              - Likely due to L C4 myelopathy; however no accompanying sensory deficit and no other weakness.   - Monitor with therapies; would consider MRI shoulder for rotator cuff pathology if no improvement    I spent a total of  56  minutes on total care today- >50% coordination of care- due to  Prolonged time  with pt- multiple medical changes- also d/w wife about dog/has papers- and d/w nursing x3 as well as PA   Addendum- still feels 1 hour later like cannot get something up- more copious secretions- suctioned by mouth 4 times in last hour- will get CXR    LOS: 1 days A FACE TO FACE EVALUATION WAS PERFORMED  Cuthbert Turton 09/18/2023, 10:29 AM

## 2023-09-18 NOTE — Progress Notes (Signed)
 Continues to have increased secretions. At times secretions are thick. Also questions regarding use of his left arm. Explains predominantly using left hand over right hand. Having questions regarding treatment plan in regards to LUE and how to make adaptations to use of extremity with current treatment issues.

## 2023-09-18 NOTE — Progress Notes (Addendum)
 Inpatient Rehabilitation Admission Medication Review by a Pharmacist  A complete drug regimen review was completed for this patient to identify any potential clinically significant medication issues.  High Risk Drug Classes Is patient taking? Indication by Medication  Antipsychotic Yes Quetiapine - mood/Behavior/Sleep/Hallucinations   Anticoagulant Yes Enoxaparin - PE  Antibiotic Yes, as an intravenous medication Cefepime - HAP  Opioid Yes Oxycodone, tramadol - pain  Antiplatelet No   Hypoglycemics/insulin No   Vasoactive Medication Yes Losartan - HTN   Chemotherapy No   Other Yes Acetaminophen, methocarbamol , gabapentin- pain management Azelastine nasal - rhinitis Albuterol - respiratory faillure/ hypoxia Melatonin - sleep  Miralax sorbitol , senokot S, fleets enema  bowel regimen/ constipation Proscar - BPH Thiamine, multivitamin- supplement Ondansetron - nause      Type of Medication Issue Identified Description of Issue Recommendation(s)  Drug Interaction(s) (clinically significant)     Duplicate Therapy     Allergy     No Medication Administration End Date     Incorrect Dose     Additional Drug Therapy Needed     Significant med changes from prior encounter (inform family/care partners about these prior to discharge).  Hydrochlorothiazide held  Restart PTA meds when and if necessary during CIR admission or at time of discharge, if warranted   Other       Clinically significant medication issues were identified that warrant physician communication and completion of prescribed/recommended actions by midnight of the next day:  No  Name of provider notified for urgent issues identified:   Provider Method of Notification:    Pharmacist comments:   Time spent performing this drug regimen review (minutes):  20   Noah Delaine, RPh Clinical Pharmacist 09/18/2023 2:28 PM
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
## 2023-09-18 NOTE — Plan of Care (Signed)
  Problem: Consults Goal: RH SPINAL CORD INJURY PATIENT EDUCATION Description:  See Patient Education module for education specifics.  Outcome: Progressing   Problem: SCI BOWEL ELIMINATION Goal: RH STG MANAGE BOWEL WITH ASSISTANCE Description: STG Manage Bowel with mod I Assistance. Outcome: Progressing   Problem: SCI BOWEL ELIMINATION Goal: RH STG SCI MANAGE BOWEL WITH MEDICATION WITH ASSISTANCE Description: STG SCI Manage bowel with medication with mod I assistance. Outcome: Progressing   Problem: SCI BLADDER ELIMINATION Goal: RH STG MANAGE BLADDER WITH ASSISTANCE Description: STG Manage Bladder With mod I Assistance Outcome: Progressing   Problem: RH SAFETY Goal: RH STG ADHERE TO SAFETY PRECAUTIONS W/ASSISTANCE/DEVICE Description: STG Adhere to Safety Precautions With cues Assistance/Device. Outcome: Progressing

## 2023-09-18 NOTE — Progress Notes (Signed)
 Reports being tired, lightheaded, and unsteady upper body while sitting in the stedy after BM. Vitals obtained Blood pressure triggering [place]. Vitals rechecked four minutes later while patient was sitting on the edge of the bed and symptoms improving. Does endorse sensation of being tired at this time. Patient also continues to express concern throughout the afternoon intermittent episodes of apnea and holding his breath with inspirations.

## 2023-09-18 NOTE — Discharge Instructions (Addendum)
 Inpatient Rehab Discharge Instructions  Brandon Watts Discharge date and time: No discharge date for patient encounter.   Activities/Precautions/ Functional Status: Activity: Cervical collar at all times Diet:  Wound Care: Routine skin checks Functional status:  ___ No restrictions     ___ Walk up steps independently ___ 24/7 supervision/assistance   ___ Walk up steps with assistance ___ Intermittent supervision/assistance  ___ Bathe/dress independently ___ Walk with walker     _x__ Bathe/dress with assistance ___ Walk Independently    ___ Shower independently ___ Walk with assistance    ___ Shower with assistance ___ No alcohol     ___ Return to work/school ________  Special Instructions: No driving smoking or alcohol   My questions have been answered and I understand these instructions. I will adhere to these goals and the provided educational materials after my discharge from the hospital.  Patient/Caregiver Signature _______________________________ Date __________  Clinician Signature _______________________________________ Date __________  Please bring this form and your medication list with you to all your follow-up doctor's appointments.    ========================================================================================= Information on my medicine - ELIQUIS (apixaban)  This medication education was reviewed with me or my healthcare representative as part of my discharge preparation.    Why was Eliquis prescribed for you? Eliquis was prescribed to treat blood clots that may have been found in the veins of your legs (deep vein thrombosis) or in your lungs (pulmonary embolism) and to reduce the risk of them occurring again.  What do You need to know about Eliquis ? Your dose is ONE 5 mg tablet taken TWICE daily.  Eliquis may be taken with or without food.   Try to take the dose about the same time in the morning and in the evening. If you have difficulty  swallowing the tablet whole please discuss with your pharmacist how to take the medication safely.  Take Eliquis exactly as prescribed and DO NOT stop taking Eliquis without talking to the doctor who prescribed the medication.  Stopping may increase your risk of developing a new blood clot.  Refill your prescription before you run out.  After discharge, you should have regular check-up appointments with your healthcare provider that is prescribing your Eliquis.    What do you do if you miss a dose? If a dose of ELIQUIS is not taken at the scheduled time, take it as soon as possible on the same day and twice-daily administration should be resumed. The dose should not be doubled to make up for a missed dose.  Important Safety Information A possible side effect of Eliquis is bleeding. You should call your healthcare provider right away if you experience any of the following: Bleeding from an injury or your nose that does not stop. Unusual colored urine (red or dark brown) or unusual colored stools (red or black). Unusual bruising for unknown reasons. A serious fall or if you hit your head (even if there is no bleeding).  Some medicines may interact with Eliquis and might increase your risk of bleeding or clotting while on Eliquis. To help avoid this, consult your healthcare provider or pharmacist prior to using any new prescription or non-prescription medications, including herbals, vitamins, non-steroidal anti-inflammatory drugs (NSAIDs) and supplements.  This website has more information on Eliquis (apixaban): http://www.eliquis.com/eliquis/home
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
## 2023-09-18 NOTE — Progress Notes (Addendum)
 Per orders patient is on a full liquid diet with medication crushed. Is alert and oriented x 4. Determined by medical team patient able to consent and make own medical decisions. Yesterday medication given crushed in apple sauce by Clinical research associate. Tolerated well. Was passed along in report to the night nurse that patient takes medication crushed in apple sauce. Report this morning patient taking medication one at a time in apple sauce per patient's request. This morning with medication administration did explain, inform, and made patient aware about route of taking medication. Pt chose and requested that medication be administered whole in apple sauce. Clarified with pt who explained liquid medication via coretrack/ng tube. With medication administration this morning tolerated fairly. Smaller pills able to swallow without any reflex or cough observed. Giving medication at slow rate, one at a time, in apple sauce, extra apple sauce to aide swallowing, and water as well to aide secretions to aide swallowing. Larger pills given at the end. Given one pill at the end. No issues with administration. From 746 to 810 medication administered. OT arrived to assist patient with tolieting/ADLS needs. Was placed in wheelchair in room and tilted back. Around 1000 patient requesting Nurse. Found in room coughing, gaging at times, and increase in secretions. Patient observed suctioning self. Assisting him with suctioning and suctioning as well. Utilizing suction swabs as well. Contacted Respiratory Therapist for additional assistance to aide in assessing patient to ensure no issues with breathing & respirations. Made Charge Nurse and Provider assigned to patient today aware as well as informed of situation. At this time Speech Therapy arrived. Explained situation to them as well. Utilizing soda and apple sauce as well as additional tools to aide patient with ongoing issues. Lung sounds assessed stridor and wheezing sounds auscultated.  Around 1050 lung sounds did demonstrate an improvement and patient reports improvement as well with ongoing issue. Does report burning sensation when swallowing. Per patient expresses "feels inflamed." New orders are placed by Provider for stat Chest X-Ray.

## 2023-09-18 NOTE — Progress Notes (Signed)
 Refused labs this morning, wants magnesium drawn with labs on Monday.

## 2023-09-19 ENCOUNTER — Inpatient Hospital Stay (HOSPITAL_COMMUNITY)

## 2023-09-19 DIAGNOSIS — G8252 Quadriplegia, C1-C4 incomplete: Secondary | ICD-10-CM | POA: Diagnosis not present

## 2023-09-19 DIAGNOSIS — M7989 Other specified soft tissue disorders: Secondary | ICD-10-CM

## 2023-09-19 MED ORDER — SCOPOLAMINE 1 MG/3DAYS TD PT72
1.0000 | MEDICATED_PATCH | TRANSDERMAL | Status: DC
  Administered 2023-09-19 – 2023-09-25 (×3): 1.5 mg via TRANSDERMAL
  Filled 2023-09-19 (×4): qty 1

## 2023-09-19 NOTE — Progress Notes (Signed)
 BLE venous duplex has been completed.   Results can be found under chart review under CV PROC. 09/19/2023 1:18 PM Avyn Aden RVT, RDMS
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
## 2023-09-20 DIAGNOSIS — N319 Neuromuscular dysfunction of bladder, unspecified: Secondary | ICD-10-CM

## 2023-09-20 DIAGNOSIS — K592 Neurogenic bowel, not elsewhere classified: Secondary | ICD-10-CM

## 2023-09-20 DIAGNOSIS — M792 Neuralgia and neuritis, unspecified: Secondary | ICD-10-CM

## 2023-09-20 DIAGNOSIS — R1312 Dysphagia, oropharyngeal phase: Secondary | ICD-10-CM

## 2023-09-20 LAB — CBC WITH DIFFERENTIAL/PLATELET
Abs Immature Granulocytes: 0.03 10*3/uL (ref 0.00–0.07)
Basophils Absolute: 0 10*3/uL (ref 0.0–0.1)
Basophils Relative: 0 %
Eosinophils Absolute: 0.3 10*3/uL (ref 0.0–0.5)
Eosinophils Relative: 3 %
HCT: 37.4 % — ABNORMAL LOW (ref 39.0–52.0)
Hemoglobin: 13 g/dL (ref 13.0–17.0)
Immature Granulocytes: 0 %
Lymphocytes Relative: 21 %
Lymphs Abs: 2 10*3/uL (ref 0.7–4.0)
MCH: 32.4 pg (ref 26.0–34.0)
MCHC: 34.8 g/dL (ref 30.0–36.0)
MCV: 93.3 fL (ref 80.0–100.0)
Monocytes Absolute: 1.2 10*3/uL — ABNORMAL HIGH (ref 0.1–1.0)
Monocytes Relative: 13 %
Neutro Abs: 5.8 10*3/uL (ref 1.7–7.7)
Neutrophils Relative %: 63 %
Platelets: 166 10*3/uL (ref 150–400)
RBC: 4.01 MIL/uL — ABNORMAL LOW (ref 4.22–5.81)
RDW: 11.3 % — ABNORMAL LOW (ref 11.5–15.5)
WBC: 9.3 10*3/uL (ref 4.0–10.5)
nRBC: 0 % (ref 0.0–0.2)

## 2023-09-20 LAB — COMPREHENSIVE METABOLIC PANEL
ALT: 113 U/L — ABNORMAL HIGH (ref 0–44)
AST: 54 U/L — ABNORMAL HIGH (ref 15–41)
Albumin: 2.9 g/dL — ABNORMAL LOW (ref 3.5–5.0)
Alkaline Phosphatase: 47 U/L (ref 38–126)
Anion gap: 5 (ref 5–15)
BUN: 17 mg/dL (ref 8–23)
CO2: 30 mmol/L (ref 22–32)
Calcium: 9.9 mg/dL (ref 8.9–10.3)
Chloride: 99 mmol/L (ref 98–111)
Creatinine, Ser: 0.66 mg/dL (ref 0.61–1.24)
GFR, Estimated: >60 mL/min (ref >60)
Glucose, Bld: 97 mg/dL (ref 70–99)
Potassium: 4.2 mmol/L (ref 3.5–5.1)
Sodium: 134 mmol/L — ABNORMAL LOW (ref 135–145)
Total Bilirubin: 0.8 mg/dL (ref 0.0–1.2)
Total Protein: 5.6 g/dL — ABNORMAL LOW (ref 6.5–8.1)

## 2023-09-20 LAB — MAGNESIUM: Magnesium: 2.2 mg/dL (ref 1.7–2.4)

## 2023-09-20 MED ORDER — TRAZODONE HCL 50 MG PO TABS
50.0000 mg | ORAL_TABLET | Freq: Every evening | ORAL | Status: DC | PRN
  Administered 2023-09-22: 50 mg via ORAL
  Filled 2023-09-20: qty 1

## 2023-09-20 MED ORDER — BOOST PLUS PO LIQD
237.0000 mL | Freq: Two times a day (BID) | ORAL | Status: DC
  Administered 2023-09-21 – 2023-10-11 (×38): 237 mL via ORAL
  Filled 2023-09-20 (×42): qty 237

## 2023-09-20 MED ORDER — ALBUTEROL SULFATE (2.5 MG/3ML) 0.083% IN NEBU
2.5000 mg | INHALATION_SOLUTION | Freq: Two times a day (BID) | RESPIRATORY_TRACT | Status: DC
  Administered 2023-09-20 – 2023-09-21 (×3): 2.5 mg via RESPIRATORY_TRACT
  Filled 2023-09-20 (×3): qty 3

## 2023-09-20 MED ORDER — GABAPENTIN 400 MG PO CAPS
400.0000 mg | ORAL_CAPSULE | Freq: Three times a day (TID) | ORAL | Status: DC
  Administered 2023-09-20 – 2023-10-11 (×63): 400 mg via ORAL
  Filled 2023-09-20 (×63): qty 1

## 2023-09-20 MED ORDER — TRAZODONE HCL 50 MG PO TABS
50.0000 mg | ORAL_TABLET | Freq: Every day | ORAL | Status: DC
  Administered 2023-09-20 – 2023-09-24 (×5): 50 mg via ORAL
  Filled 2023-09-20 (×5): qty 1

## 2023-09-20 MED ORDER — OSMOLITE 1.5 CAL PO LIQD
1000.0000 mL | ORAL | Status: DC
  Administered 2023-09-21 – 2023-09-22 (×2): 1000 mL
  Filled 2023-09-20: qty 1000

## 2023-09-20 MED ORDER — FREE WATER
200.0000 mL | Freq: Three times a day (TID) | Status: DC
  Administered 2023-09-20 – 2023-09-23 (×9): 200 mL

## 2023-09-20 NOTE — Progress Notes (Signed)
 Inpatient Rehabilitation  Patient information reviewed and entered into eRehab system by [facility]. Karen Kays., CCC/SLP, PPS Coordinator.  Information including medical coding, functional ability and quality indicators will be reviewed and updated through discharge.

## 2023-09-20 NOTE — Progress Notes (Signed)
 Fluids given PO at bedside.   Tilden Dome, LPN

## 2023-09-20 NOTE — Progress Notes (Signed)
 Physical Therapy Session Note  Patient Details  Name: Brandon Watts MRN: [id number] Date of Birth: [birth year]  Today's Date: 09/20/2023 PT Individual Time: 1007-1050 PT Individual Time Calculation (min): 43 min   Short Term Goals: Week 1:  PT Short Term Goal 1 (Week 1): Pt will complete bed mobility with minA PT Short Term Goal 2 (Week 1): Pt will complete sit to stand with modA + LRAD PT Short Term Goal 3 (Week 1): Pt will complete SPT with modA + LRAD PT Short Term Goal 4 (Week 1): Pt will amb 43ft with modA + LRAD PT Short Term Goal 5 (Week 1): Pt will initiate stair training  Skilled Therapeutic Interventions/Progress Updates:     Pt received seated in bed/chair position holding onto wife's hands for support while practicing static sitting balance. Pt agreeable to therapy and notes unrated pain, nsg present to administer medication.   Soft collar doffed dependently and hard collar donned with verbal cues to maintain static head position while collar being changed. Pt state his goal is to be able to stand long enough to urinate from standing position so foley can be removed - notes that he has to be in standing rather than in sitting d/t prostate enlargement. Pt able to scoot back in bed with SBA and initiate some LE movement to move to seated EOB. Pt maintained sitting balance with feet unsupported with SBA while nsg administered pain medication, pt able to maintain grip on water with R hand. Pt intermittently moved L hand back into lap with SBA, notable sulcus sign on L d/t decreased shoulder girdle activation.   Sit<>stand practice to improve transfers and LE strength: with RW and minA. Pt performed x3 reps with c/o dizziness after 5-15 seconds on each bout - attempted LE exercises (ankle pumps, seated marches, LAQ) prior to second stand and immediate marching in place after standing on third stand which delayed sx some but pt still required seated rest break d/t symptoms. Pt maintained  standing balance with CGA and RW prior to onset of symptoms for each rep. Messaged MD about abdominal binder or thigh-high TED hose for [facility] management.  Pt moved from seated<>supine with modA for LE management and dependently repositioned in bed. Pt left supine in bed with HOB >30* - direct handoff to OT.  Therapy Documentation Precautions:  Precautions Precautions: Fall, Cervical Precaution/Restrictions Comments: Foley Required Braces or Orthoses: Cervical Brace Cervical Brace: Hard collar, Soft collar Restrictions Weight Bearing Restrictions Per Provider Order: No General: PT Amount of Missed Time (min): 32 Minutes PT Missed Treatment Reason: Patient fatigue     Therapy/Group: Individual Therapy  [name] 09/20/2023, 12:50 PM

## 2023-09-20 NOTE — Progress Notes (Signed)
 Additional fluids given at bedside during med pass.    Tilden Dome, LPN

## 2023-09-20 NOTE — Progress Notes (Signed)
 Bed replaced.   Tilden Dome, LPN
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
## 2023-09-20 NOTE — IPOC Note (Signed)
 Overall Plan of Care [facility]) Patient Details Name: Brandon Watts MRN: [id number] DOB: Sep 03, 1945  Admitting Diagnosis: Quadriplegia, C1-C4, incomplete [facility])  Hospital Problems: Principal Problem:   Quadriplegia, C1-C4, incomplete (HCC) Active Problems:   C4 cervical fracture (HCC)   Central cord syndrome (HCC)   Dysphagia   Respiratory failure with hypoxia (HCC)   SDH (subdural hematoma) (HCC)     Functional Problem List: Nursing Behavior, Bladder, Bowel, Endurance, Medication Management, Safety, Pain  PT Balance, Behavior, Edema, Endurance, Motor, Pain, Perception, Safety, Sensory  OT Balance, Endurance, Motor, Pain, Nutrition, Perception, Safety, Sensory, Skin Integrity  SLP    TR         Basic ADL's: OT Eating, Grooming, Bathing, Dressing, Toileting     Advanced  ADL's: OT       Transfers: PT Bed Mobility, Bed to Chair, Customer service manager, Tub/Shower     Locomotion: PT Ambulation, Stairs     Additional Impairments: OT Fuctional Use of Upper Extremity  SLP Swallowing, Communication      TR      Anticipated Outcomes Item Anticipated Outcome  Self Feeding SUP  Swallowing  Supervision   Basic self-care  Min A  Toileting  Min A   Bathroom Transfers Min A  Bowel/Bladder  manage bowels with medications/ manage bladder with medications/ time toileting  Transfers  sup + LRAD  Locomotion  sup + LRAD, CGA for stairs  Communication  Supervision  Cognition     Pain  <4 w/ prns  Safety/Judgment  manage safety with min A   Therapy Plan: PT Intensity: Minimum of 1-2 x/day ,45 to 90 minutes PT Frequency: 5 out of 7 days PT Duration Estimated Length of Stay: 3 weeks OT Intensity: Minimum of 1-2 x/day, 45 to 90 minutes OT Frequency: 5 out of 7 days OT Duration/Estimated Length of Stay: 17-21 days SLP Intensity: Minumum of 1-2 x/day, 30 to 90 minutes SLP Frequency: 3 to 5 out of 7 days SLP Duration/Estimated Length of Stay: 19- 21 days   Team  Interventions: Nursing Interventions Patient/Family Education, Bladder Management, Bowel Management, Disease Management/Prevention, Pain Management, Medication Management, Discharge Planning  PT interventions Ambulation/gait training, Cognitive remediation/compensation, Discharge planning, DME/adaptive equipment instruction, Functional mobility training, Pain management, Psychosocial support, Splinting/orthotics, Therapeutic Activities, UE/LE Strength taining/ROM, Visual/perceptual remediation/compensation, UE/LE Coordination activities, Therapeutic Exercise, Stair training, Skin care/wound management, Patient/family education, Neuromuscular re-education, Functional electrical stimulation, Disease management/prevention, Firefighter, Warden/ranger  OT Interventions Warden/ranger, Cognitive remediation/compensation, Firefighter, Fish farm manager, Disease mangement/prevention, Discharge planning, Functional electrical stimulation, Functional mobility training, Neuromuscular re-education, Pain management, Psychosocial support, Splinting/orthotics, Patient/family education, Skin care/wound managment, Self Care/advanced ADL retraining, Therapeutic Activities, Therapeutic Exercise, UE/LE Strength taining/ROM, Wheelchair propulsion/positioning, Visual/perceptual remediation/compensation, UE/LE Coordination activities  SLP Interventions Dysphagia/aspiration precaution training, Patient/family education, Therapeutic Activities, Functional tasks, Speech/Language facilitation  TR Interventions    SW/CM Interventions     Barriers to Discharge MD  Medical stability  Nursing Decreased caregiver support, Neurogenic Bowel & Bladder House  Discharge Home Layout: One level  Discharge Home Access: Stairs to enter  Entrance Stairs-Rails: Right, Left  Entrance Stairs-Number of Steps: 2  PT Home environment access/layout, Decreased caregiver support    OT  Neurogenic Bowel & Bladder, Wound Care    SLP      SW       Team Discharge Planning: Destination: PT-Home ,OT- Home , SLP-Home Projected Follow-up: PT-Home health PT, OT-  Home health OT, SLP-Home Health SLP, Outpatient SLP Projected Equipment Needs: PT-To  be determined, OT- To be determined, SLP-None recommended by SLP Equipment Details: PT- , OT-  Patient/family involved in discharge planning: PT- Patient, Family member/caregiver,  OT-Patient, SLP-Patient, Family member/caregiver  MD ELOS: 19-21 days Medical Rehab Prognosis:  Excellent Assessment: The patient has been admitted for CIR therapies with the diagnosis of cervical central cord syndrome, TBI. The team will be addressing functional mobility, strength, stamina, balance, safety, adaptive techniques and equipment, self-care, bowel and bladder mgt, patient and caregiver education, NMR, swallowing , communication, cognition, community reentry. Goals have been set at supervision to min assist with ADL's and mobility and supervision for cognition. Anticipated discharge destination is home.        See Team Conference Notes for weekly updates to the plan of care

## 2023-09-20 NOTE — Plan of Care (Signed)
  Problem: RH Swallowing Goal: LTG Patient will consume least restrictive diet using compensatory strategies with assistance (SLP) Description: LTG:  Patient will consume least restrictive diet using compensatory strategies with assistance (SLP) Flowsheets (Taken 09/20/2023 1548) LTG: Pt Patient will consume least restrictive diet using compensatory strategies with assistance of (SLP): Modified Independent Goal: LTG Patient will participate in dysphagia therapy to increase swallow function with assistance (SLP) Description: LTG:  Patient will participate in dysphagia therapy to increase swallow function with assistance (SLP) Flowsheets (Taken 09/20/2023 1548) LTG: Pt will participate in dysphagia therapy to increase swallow function with assistance of (SLP): Modified Independent

## 2023-09-20 NOTE — Care Management (Signed)
 Inpatient Rehabilitation Center Individual Statement of Services  Patient Name:  Brandon Watts  Date:  09/20/2023  Welcome to the Inpatient Rehabilitation Center.  Our goal is to provide you with an individualized program based on your diagnosis and situation, designed to meet your specific needs.  With this comprehensive rehabilitation program, you will be expected to participate in at least 3 hours of rehabilitation therapies Monday-Friday, with modified therapy programming on the weekends.  Your rehabilitation program will include the following services:  Physical Therapy (PT), Occupational Therapy (OT), Speech Therapy (ST), 24 hour per day rehabilitation nursing, Therapeutic Recreaction (TR), Psychology, Neuropsychology, Care Coordinator, Rehabilitation Medicine, Nutrition Services, Pharmacy Services, and Other  Weekly team conferences will be held on Tueadays to discuss your progress.  Your Inpatient Rehabilitation Care Coordinator will talk with you frequently to get your input and to update you on team discussions.  Team conferences with you and your family in attendance may also be held.  Expected length of stay: 17-21 days    Overall anticipated outcome: Supervision  Depending on your progress and recovery, your program may change. Your Inpatient Rehabilitation Care Coordinator will coordinate services and will keep you informed of any changes. Your Inpatient Rehabilitation Care Coordinator's name and contact numbers are listed  below.  The following services may also be recommended but are not provided by the Inpatient Rehabilitation Center:  Driving Evaluations Home Health Rehabiltiation Services Outpatient Rehabilitation Services Vocational Rehabilitation   Arrangements will be made to provide these services after discharge if needed.  Arrangements include referral to agencies that provide these services.  Your insurance has been verified to be:  [facility]  Your primary  doctor is:  Cheryll Cockayne  Pertinent information will be shared with your doctor and your insurance company.  Inpatient Rehabilitation Care Coordinator:  Susie Cassette [phone] or [phone]  Information discussed with and copy given to patient by: Gretchen Short, 09/20/2023, 10:03 AM

## 2023-09-20 NOTE — Progress Notes (Signed)
 Additional fluids given at bedside during med passes. Educated patient on importance of oral intake and staff support with assistance with oral intake. Patient verbalized understanding.   Tilden Dome, LPN

## 2023-09-20 NOTE — Progress Notes (Signed)
 Speech Language Pathology Daily Session Note  Patient Details  Name: Brandon Watts MRN: [id number] Date of Birth: Sep 05, 1945  Today's Date: 09/20/2023 SLP Individual Time: 1430-1535 SLP Individual Time Calculation (min): 65 min  Short Term Goals: Week 1: SLP Short Term Goal 1 (Week 1): Pt will increase breath support for production of words at phrase level with 85% intelligibility min A SLP Short Term Goal 2 (Week 1): Pt will increase use of speech intelligibility strategies for phrases with  85% intelligibility min A SLP Short Term Goal 3 (Week 1): Pt will tolerate trials with pureed diet and thin liquids with no overt s/sx of aspiration or penetration on 9/10 trials. SLP Short Term Goal 4 (Week 1): Bedside swallow assessment to determine safest diet. SLP Short Term Goal 5 (Week 1): Pt will complete oral care prior to po intake to ensure good oral hygiene prior to po intake x100%  Skilled Therapeutic Interventions:   Pt greeted at bedside. He was awake upon SLP arrival, though fatigue was evident. Despite fatigue, he was pleasant and cooperative throughout tx tasks targeting dysphagia. Pt demonstrated adequate speech intelligibility throughout initial conversation re MBSS results and current oropharyngeal dysphagia. He presented w/ hoarse vocal quality, but was able to coordinate breath support and voicing for adequate speech volume. He reported improved secretion management given introduction of scopolamine patch this AM. Consistent subtle throat clearing noted prior to PO trials. Pt challenged to trial thin liquids via straw, ice cream, and applesauce (Dys 1 puree). He presented w/ increased throat clearing with ice cream and applesauce, which was anticipated given pharyngeal residue evident on MBSS 09/14/23. He presented w/ an immediate cough during 3/10 thin liquid trials. Anticipate potentially reduced airway protection given impaired breath/swallow coordination and fatigue. Pt demonstrated  excellent sensation of residue and potential airway invasion throughout PO trials. Pt reported managing Dys 3 trials in acute care given additional time to allow for liquid washes/throat clears/multiple swallows. Given reduced wet vocal quality, improved secretion management, and (seemingly) reduced pharyngeal residue; recommend upgrade to Dys 2 textures. Continue w/ thin liquids. Large pills crushed in puree - small pill can be given whole in puree. Total supervision/assist for all meals to ensure pt safety. Will follow up w/ pt in upcoming tx session to ensure success. Would likely benefit from repeat MBSS as well to r/o airway invasion w/ thin liquids.  Pain  Pt reported some neck pain - reported that he would ask the nurse for pain meds as needed upon completion of ST tx session. See EMR for more info.   Therapy/Group: Individual Therapy  [name] 09/20/2023, 3:54 PM

## 2023-09-20 NOTE — Progress Notes (Signed)
 Occupational Therapy Session Note  Patient Details  Name: Brandon Watts MRN: [id number] Date of Birth: 13-Aug-1945  Today's Date: 09/20/2023 OT Individual Time: 1050-1200 OT Individual Time Calculation (min): 70 min    Short Term Goals: Week 1:  OT Short Term Goal 1 (Week 1): Pt will perform toilet transfer with Mod A (x1) + LRAD. OT Short Term Goal 2 (Week 1): Pt will perform LB bathing with Mod A (x1) + LRAD. OT Short Term Goal 3 (Week 1): Pt will perform LB dressing with Mod A (x1) + LRAD.  Skilled Therapeutic Interventions/Progress Updates:    PT resting in bed upon arrival. Handoff from PT. Wifre present. OT intervention with focus on discharge planning, activity tolerance, bed mobility, dressing at bed level and sitting EOB. Supine>sit EOB with mod A. Pt reports lightheadedness with sitting EOB x 3. Unable to sit EOB long enough to obtains sitting BP. Pt able to bridge in bed to facilitate doffing/donning brief and pants. Pt sat EOB to assist with donning button up shirt. Discussed purpose of OT and LTGs. Pt with difficulty keeping eyes open during 2nd half of session and pt reported increased fatigue. Pt not resting well at night. Pt remained I nbed with all needs within reach. Wife present.   Therapy Documentation Precautions:  Precautions Precautions: Fall, Cervical Precaution/Restrictions Comments: Foley Required Braces or Orthoses: Cervical Brace Cervical Brace: Hard collar, Soft collar Restrictions Weight Bearing Restrictions Per Provider Order: No Pain:  Pt reports low back pain and LUE (4/10); meds admin by LPN at end of session, repositioned   Therapy/Group: Individual Therapy  Brandon Watts 09/20/2023, 3:10 PM

## 2023-09-20 NOTE — Progress Notes (Signed)
 Patient ID: Brandon Watts, male   DOB: 1945-08-29, 78 y.o.   MRN: [id number]  1004- SW left message for pt wife Natalia Leatherwood introducing self, explaining role, discussed discharge process,and inform on ELOS. SW shared will follow-up with updates after team conference.     Cecile Sheerer, MSW, LCSW Office: [phone] Cell: [phone] Fax: [phone]

## 2023-09-21 ENCOUNTER — Other Ambulatory Visit (HOSPITAL_COMMUNITY): Payer: Self-pay

## 2023-09-21 ENCOUNTER — Telehealth (HOSPITAL_COMMUNITY): Payer: Self-pay | Admitting: Pharmacy Technician

## 2023-09-21 MED ORDER — ACETAMINOPHEN 650 MG RE SUPP
650.0000 mg | Freq: Three times a day (TID) | RECTAL | Status: DC
  Filled 2023-09-21 (×7): qty 1

## 2023-09-21 MED ORDER — ACETAMINOPHEN 500 MG PO TABS
1000.0000 mg | ORAL_TABLET | Freq: Three times a day (TID) | ORAL | Status: DC
  Administered 2023-09-21 – 2023-09-26 (×16): 1000 mg via ORAL
  Filled 2023-09-21 (×18): qty 2

## 2023-09-21 MED ORDER — LOSARTAN POTASSIUM 25 MG PO TABS
25.0000 mg | ORAL_TABLET | Freq: Every day | ORAL | Status: DC
  Administered 2023-09-23 – 2023-09-26 (×4): 25 mg via ORAL
  Filled 2023-09-21 (×5): qty 1

## 2023-09-21 MED ORDER — MIDODRINE HCL 5 MG PO TABS
2.5000 mg | ORAL_TABLET | Freq: Three times a day (TID) | ORAL | Status: DC
  Administered 2023-09-21 – 2023-09-23 (×6): 2.5 mg via ORAL
  Filled 2023-09-21 (×6): qty 1
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
## 2023-09-21 NOTE — Progress Notes (Signed)
 Speech Language Pathology Daily Session Note  Patient Details  Name: Brandon Watts MRN: [id number] Date of Birth: 1946-03-03  Today's Date: 09/21/2023 SLP Individual Time: 1445-1530 SLP Individual Time Calculation (min): 45 min  Short Term Goals: Week 1: SLP Short Term Goal 1 (Week 1): Pt will increase breath support for production of words at phrase level with 85% intelligibility min A SLP Short Term Goal 2 (Week 1): Pt will increase use of speech intelligibility strategies for phrases with  85% intelligibility min A SLP Short Term Goal 3 (Week 1): Pt will tolerate trials with pureed diet and thin liquids with no overt s/sx of aspiration or penetration on 9/10 trials. SLP Short Term Goal 4 (Week 1): Bedside swallow assessment to determine safest diet. SLP Short Term Goal 5 (Week 1): Pt will complete oral care prior to po intake to ensure good oral hygiene prior to po intake x100%  Skilled Therapeutic Interventions:   The pt and his wife were greeted at bedside. He was pleasant and cooperative throughout tx tasks targeting dysphagia. SLP facilitated PO trials with thin liquids. He presented w/ intermittent delayed throat clear, though throat clear and coughing noted prior to PO intake as well given recent ACDF. SLP also introduced pharyngeal exercises x3 (masako, open mouth swallow, and mendelsohn). After a demo, he was able to complete the exercises x10 each with s cues to maintain max potential. SLP also assisted the pt to set up new section prior to SLP departure. At the end of tx tasks, he was left in bed with his call light within reach and bed alarm set. Recommend cont ST. Plan to complete MBSS 3/12 to re assess oropharyngeal swallow function.   Pain Pain Assessment Pain Scale: 0-10 Pain Score: 0-No pain  Therapy/Group: Individual Therapy  [name] 09/21/2023, 4:13 PM

## 2023-09-21 NOTE — Patient Care Conference (Signed)
 Inpatient RehabilitationTeam Conference and Plan of Care Update Date: 09/21/2023   Time: 1128 am    Patient Name: Brandon Watts      Medical Record Number: [id number]  Date of Birth: [birth year] Sex: Male         Room/Bed: 4W17C/4W17C-01 Payor Info: Payor: [employer] MEDICARE / Plan: [employer] MEDICARE / Product Type: *No Product type* /    Admit Date/Time:  09/17/2023  3:22 PM  Primary Diagnosis:  Quadriplegia, C1-C4, incomplete [facility])  Hospital Problems: Principal Problem:   Quadriplegia, C1-C4, incomplete (HCC) Active Problems:   C4 cervical fracture (HCC)   Central cord syndrome (HCC)   Dysphagia   Respiratory failure with hypoxia (HCC)   SDH (subdural hematoma) [facility])    Expected Discharge Date: Expected Discharge Date: 10/11/23  Team Members Present: Physician leading conference: Dr. Genice Rouge Social Worker Present: Cecile Sheerer, LCSWA Nurse Present: Konrad Dolores, RN PT Present: Bernie Covey, PT OT Present: [name], OT;Ardis Rowan, COTA SLP Present: Pablo Lawrence, SLP PPS Coordinator present : Fae Pippin, SLP     Current Status/Progress Goal Weekly Team Focus  Bowel/Bladder   Patient is contienent of bowel, last BM 3/9. Patient had foley removed this AM, voiding trial currently.  Neurogenic bowel/ bladder Patient continent.   Timed toileting for continence.    Swallow/Nutrition/ Hydration   upgraded to Dys 2 textures and thin liquids 3/10   modI  repeat MBSS (planned for 3/12), trials of upgraded consistencies    ADL's   bathing-max A; UB dressing-max A; LB dressing-tot A; OH with sitting EOB; abd binder ordered; LUE weakness   min a overall   education, BADLs, transfers    Mobility   mod-max bed mobiltiy, STS with mod to min but limited by [facility]   supervision overall  upright tolerance, BP management    Communication   WFL            Safety/Cognition/ Behavioral Observations  WFL -slight hoarse vocal quality             Pain   Patient has pain that is controlled by scheduled medications.   Patient pain controlled.   Provide patient with alternative therapies and pain medications to control pain level.    Skin   Patient has incison site.   Keep incision site CDI to promote healing.  Assess skin Qshift.      Discharge Planning:  Pt will d/c to home with his wife. Pt would like to be able as independent as possible within reason. SW will confirm there are no barriers to discharge.   Team Discussion: Patient admitted with SCI (central cord syndrome)- C4 ASIA C/D (incomplete quadriplegia) with C4 fracture; Status post arthrodesis C3-4 anterior interbody technique discectomy for decompression of spinal cord and exiting nerve roots with foraminotomies Additional level C4-5 interbody technique for decompression placement of anterior instrumentation consisting of interbody plate and screws C3-4-5 09/09/2023.  wth dysphagia, left shoulder weakness, Patient has a subdural hematoma and increased secretions: medications adjusted by MD.  delirium, and depression.  Patient limited by pain,  hypotension,  and insomnia.   Patient on target to meet rehab goals: Yes,  patient requires max assist - total assistance with ADLs. Patient requires mod-min assist with sit to stand using stedy. Goals for discharge are set at min A -supervision overall.  *See Care Plan and progress notes for long and short-term goals.   Revisions to Treatment Plan:  E stim Abdominal binder Thigh high teds Telesitter for safety  HS feeds via Cortrak Dysphagia 3 diet after MBS Dietary consult Neuropsych consult  Teaching Needs: Safety, medications, transfers, toileting, dietary modifications,etc.  Current Barriers to Discharge: Decreased caregiver support and Neurogenic bowel and bladder  Possible Resolutions to Barriers: Family education     Medical Summary Current Status: hypersensitive on R arm- Central cord syndrome-  incontinent Bowel- bladder usually continent- incision on neck looks good  Barriers to Discharge: Behavior/Mood;Incontinence;Neurogenic Bowel & Bladder;Medical stability;Inadequate Nutritional Intake;Self-care education;Hypotension;Infection/IV Antibiotics;Weight bearing restrictions;Uncontrolled Pain  Barriers to Discharge Comments: limited by small wife- central cord syndrome, esp UB weakness- BP 58/39 standing in [place]- SDH with cognitive issues, holding breath, has COrtrak- secretion issues- Low BP Possible Resolutions to [employer]: added Midodrine 2.5 mg TID and redduced COzaar; added Scopolamine for secretions; up to D2 diet- repeat MBSS tomorrow- 3 weeks- 3/31- Monday   Continued Need for Acute Rehabilitation Level of Care: The patient requires daily medical management by a physician with specialized training in physical medicine and rehabilitation for the following reasons: Direction of a multidisciplinary physical rehabilitation program to maximize functional independence : Yes Medical management of patient stability for increased activity during participation in an intensive rehabilitation regime.: Yes Analysis of laboratory values and/or radiology reports with any subsequent need for medication adjustment and/or medical intervention. : Yes   I attest that I was present, lead the team conference, and concur with the assessment and plan of the team.   Konrad Dolores [facility] 09/21/2023, 10:03 PM

## 2023-09-21 NOTE — Progress Notes (Signed)
 Occupational Therapy Session Note  Patient Details  Name: Brandon Watts MRN: [id number] Date of Birth: [birth year]  Today's Date: 09/21/2023 OT Individual Time: 3244-0102 OT Individual Time Calculation (min): 75 min    Short Term Goals: Week 1:  OT Short Term Goal 1 (Week 1): Pt will perform toilet transfer with Mod A (x1) + LRAD. OT Short Term Goal 2 (Week 1): Pt will perform LB bathing with Mod A (x1) + LRAD. OT Short Term Goal 3 (Week 1): Pt will perform LB dressing with Mod A (x1) + LRAD.  Skilled Therapeutic Interventions/Progress Updates:    Pt resting in TIS w/c upon arrival with wife present. Initial focus on donning pants. Pt dependent for threading and pulling over hips. Sit<>stand in [place] with CGA x 5 with lightheadedness noted.   BP seated 120/63 HR 60 Standing 58/39 HR 67  Unable to obtain BP more then initial 2/2 ongoing lightheadedness. Thigh high Teds donned and abdominal binder applied in earlier therapy sessions.   BUE therex initiated: RUE-biceps curls with 2# and pt instructed to complete 3x10 LUE-pt only able to flex elbow ~80* with body weight. Pt instructed to perform 3x10  Pt remained in TIS w/c with suction and call bell within reach. Wife present.    Therapy Documentation Precautions:  Precautions Precautions: Fall, Cervical Precaution/Restrictions Comments: Foley Required Braces or Orthoses: Cervical Brace Cervical Brace: Hard collar, Soft collar Restrictions Weight Bearing Restrictions Per Provider Order: No Pain: Pt reports Bil shoulder pain L>R with increased LUE hypersensitivity to light touch; upper traps trigger points noted and relieved with MFR  Therapy/Group: Individual Therapy  [name] 09/21/2023, 10:56 AM

## 2023-09-21 NOTE — Plan of Care (Signed)
  Problem: Consults Goal: RH SPINAL CORD INJURY PATIENT EDUCATION Description:  See Patient Education module for education specifics.  Outcome: Progressing   Problem: SCI BOWEL ELIMINATION Goal: RH STG MANAGE BOWEL WITH ASSISTANCE Description: STG Manage Bowel with mod I Assistance. Outcome: Progressing Goal: RH STG SCI MANAGE BOWEL WITH MEDICATION WITH ASSISTANCE Description: STG SCI Manage bowel with medication with mod I assistance. Outcome: Progressing   Problem: SCI BLADDER ELIMINATION Goal: RH STG MANAGE BLADDER WITH ASSISTANCE Description: STG Manage Bladder With mod I Assistance Outcome: Progressing Goal: RH STG MANAGE BLADDER WITH MEDICATION WITH ASSISTANCE Description: STG Manage Bladder With Medication With mod I Assistance. Outcome: Progressing   Problem: RH SAFETY Goal: RH STG ADHERE TO SAFETY PRECAUTIONS W/ASSISTANCE/DEVICE Description: STG Adhere to Safety Precautions With cues Assistance/Device. Outcome: Progressing   Problem: RH PAIN MANAGEMENT Goal: RH STG PAIN MANAGED AT OR BELOW PT'S PAIN GOAL Description: < 4 with prns Outcome: Progressing   Problem: RH KNOWLEDGE DEFICIT SCI Goal: RH STG INCREASE KNOWLEDGE OF SELF CARE AFTER SCI Description: Patient and spouse will be able to mange care using educational resources for medication, spinal cord injury, dietary modifications and skin care independently Outcome: Progressing

## 2023-09-21 NOTE — Telephone Encounter (Signed)
 Patient Product/process development scientist completed.    The patient is insured through [facility]. Patient has Medicare and is not eligible for a copay card, but may be able to apply for patient assistance or Medicare RX Payment Plan (Patient Must reach out to their plan, if eligible for payment plan), if available.    Ran test claim for Eliquis 5 mg and the current 30 day co-pay is $47.00.  Ran test claim for Xarelto 20 mg and the current 30 day co-pay is $47.00.  This test claim was processed through [facility]- copay amounts may vary at other pharmacies due to pharmacy/plan contracts, or as the patient moves through the different stages of their insurance plan.     Roland Earl, CPHT Pharmacy Technician III Certified Patient Advocate [facility] Patient Advocate Team Direct Number: [phone]  Fax: [phone]

## 2023-09-21 NOTE — Progress Notes (Signed)
 Physical Therapy Session Note  Patient Details  Name: Brandon Watts MRN: [id number] Date of Birth: 1945-08-17  Today's Date: 09/21/2023 PT Individual Time: 1415-1445 PT Individual Time Calculation (min): 30 min   Short Term Goals: Week 1:  PT Short Term Goal 1 (Week 1): Pt will complete bed mobility with minA PT Short Term Goal 2 (Week 1): Pt will complete sit to stand with modA + LRAD PT Short Term Goal 3 (Week 1): Pt will complete SPT with modA + LRAD PT Short Term Goal 4 (Week 1): Pt will amb 25ft with modA + LRAD PT Short Term Goal 5 (Week 1): Pt will initiate stair training  Skilled Therapeutic Interventions/Progress Updates:      Therapy Documentation Precautions:  Precautions Precautions: Fall, Cervical Precaution/Restrictions Comments: Foley Required Braces or Orthoses: Cervical Brace Cervical Brace: Hard collar, Soft collar Restrictions Weight Bearing Restrictions Per Provider Order: No  Pt educated on set-up, process, and benefits of electrical stimulation and pt receptive to trial. PT inspected and cleaned skin prior to application of NMES unit to L UE wrist extensors. Pt reports sensation and with active muscle contraction at intensity of 20 and performed active wrist extension in gravity eliminated position x 8 minutes. Pt able to achieve extension past neutral and encouraged by progress. Pt left semi-reclined with all needs in reach and spouse present. Pt without reports of pain in session.     Therapy/Group: Individual Therapy  Truitt Leep Truitt Leep PT, DPT  09/21/2023, 7:22 PM

## 2023-09-21 NOTE — Progress Notes (Signed)
 Physical Therapy Session Note  Patient Details  Name: Brandon Watts MRN: [id number] Date of Birth: [birth year]  Today's Date: 09/21/2023 PT Individual Time: 0801-0902, 1136 - 1203 PT Individual Time Calculation (min): 61 min, 27 min  Short Term Goals: Week 1:  PT Short Term Goal 1 (Week 1): Pt will complete bed mobility with minA PT Short Term Goal 2 (Week 1): Pt will complete sit to stand with modA + LRAD PT Short Term Goal 3 (Week 1): Pt will complete SPT with modA + LRAD PT Short Term Goal 4 (Week 1): Pt will amb 23ft with modA + LRAD PT Short Term Goal 5 (Week 1): Pt will initiate stair training  Skilled Therapeutic Interventions/Progress Updates:     Session 1: Pt received supine in bed with HOB elevated and agreeable to therapy session. Pt notes unrated pain, nsg present to administer pain medication.  Pt requests to use the bathroom d/t some bowel sensation, unsure of how much or how urgent. SPT dependently doffed soft collar and donned hard cervical collar as well as thigh-high TED. Pt transitioned from supine with HOB elevated <>sitting EOB with minA, abdominal binder donned dependently. Pt performed CGA stand with Stedy and endorsed some sx of lightheadedness, transported to toilet and CGA sit to toilet. Supervision for toileting and CGA standing with Stedy during dependent pericare. While performing pericare pt experienced near syncopal episode d/t OH - pt lowered back to toilet and given back support, BLE elevated, and sx abated after several minutes.   Tx via stedy to TIS - CGA for sit<>stand<>sit, minA for repositioning in TIS, pt noted some sx but decreased with rest. Pt performed 2x7 sit<>stands from TIS with RW and minA, endorsed 4/10 lightheadedness sx that did not change with seated LE exercise prior to sit<>stands. Pt given verbal cues to breathe during sit<>stands, had tendency to hold breath. Discussed diaphragmatic breathing. Pt reported he was very fatigued from the  sit<>stands and OH symptoms.  Pt had wet vocal quality and was instructed in huffing technique to clear secretions and found it helpful. Pt left seated in TIS with all needs in reach.   Session 2: Pt receive supine in bed - states he is agreeable to therapy but notes significant fatigue d/t previous therapy sessions and significant episode of hypotension during OT session. Pt expresses frustration with urinary retention and need for catheterization. Pt notes unrated pain but has received pain medication recently.   Pt moves from supine<>sit with modA and is able to scoot forward in bed with CGA and verbal cues for technique. Pt performs 2x20 leg extensions, seated hip flexion, ankle pumps, and R shoulder flexion before stand pivot transfer with RW to TIS with minA d/t symptoms. Pt scooted posteriorly in chair with minA and instruction on head/hips relationship. Pt left seated in TIS with all needs in reach.    Therapy Documentation Precautions:  Precautions Precautions: Fall, Cervical Precaution/Restrictions Comments: Foley Required Braces or Orthoses: Cervical Brace Cervical Brace: Hard collar, Soft collar Restrictions Weight Bearing Restrictions Per Provider Order: No General:      Therapy/Group: Individual Therapy  [name] 09/21/2023, 12:14 PM

## 2023-09-21 NOTE — Progress Notes (Addendum)
 Additional fluids given at bedside during all med passes.     Tilden Dome, LPN

## 2023-09-22 ENCOUNTER — Inpatient Hospital Stay (HOSPITAL_COMMUNITY)

## 2023-09-22 MED ORDER — APIXABAN 5 MG PO TABS
5.0000 mg | ORAL_TABLET | Freq: Two times a day (BID) | ORAL | Status: DC
  Administered 2023-09-22 – 2023-10-11 (×39): 5 mg via ORAL
  Filled 2023-09-22 (×40): qty 1

## 2023-09-22 MED ORDER — BETHANECHOL CHLORIDE 10 MG PO TABS
5.0000 mg | ORAL_TABLET | Freq: Three times a day (TID) | ORAL | Status: DC
  Administered 2023-09-22 – 2023-10-06 (×43): 5 mg via ORAL
  Filled 2023-09-22 (×43): qty 1

## 2023-09-22 MED ORDER — ALBUTEROL SULFATE (2.5 MG/3ML) 0.083% IN NEBU
2.5000 mg | INHALATION_SOLUTION | Freq: Four times a day (QID) | RESPIRATORY_TRACT | Status: DC
  Filled 2023-09-22: qty 3

## 2023-09-22 MED ORDER — BIOTENE DRY MOUTH MT LIQD
15.0000 mL | OROMUCOSAL | Status: DC | PRN
  Filled 2023-09-22 (×2): qty 15

## 2023-09-22 MED ORDER — ALBUTEROL SULFATE (2.5 MG/3ML) 0.083% IN NEBU
2.5000 mg | INHALATION_SOLUTION | Freq: Four times a day (QID) | RESPIRATORY_TRACT | Status: DC | PRN
  Administered 2023-09-25: 2.5 mg via RESPIRATORY_TRACT
  Filled 2023-09-22: qty 3

## 2023-09-22 MED ORDER — ALBUTEROL SULFATE (2.5 MG/3ML) 0.083% IN NEBU: 2.5000 mg | INHALATION_SOLUTION | Freq: Four times a day (QID) | RESPIRATORY_TRACT | Status: DC | PRN

## 2023-09-22 NOTE — Plan of Care (Signed)
  Problem: Consults Goal: RH SPINAL CORD INJURY PATIENT EDUCATION Description:  See Patient Education module for education specifics.  Outcome: Progressing   Problem: SCI BOWEL ELIMINATION Goal: RH STG MANAGE BOWEL WITH ASSISTANCE Description: STG Manage Bowel with mod I Assistance. Outcome: Progressing Goal: RH STG SCI MANAGE BOWEL WITH MEDICATION WITH ASSISTANCE Description: STG SCI Manage bowel with medication with mod I assistance. Outcome: Progressing   Problem: SCI BLADDER ELIMINATION Goal: RH STG MANAGE BLADDER WITH ASSISTANCE Description: STG Manage Bladder With mod I Assistance Outcome: Progressing Goal: RH STG MANAGE BLADDER WITH MEDICATION WITH ASSISTANCE Description: STG Manage Bladder With Medication With mod I Assistance. Outcome: Progressing   Problem: RH SAFETY Goal: RH STG ADHERE TO SAFETY PRECAUTIONS W/ASSISTANCE/DEVICE Description: STG Adhere to Safety Precautions With cues Assistance/Device. Outcome: Progressing   Problem: RH PAIN MANAGEMENT Goal: RH STG PAIN MANAGED AT OR BELOW PT'S PAIN GOAL Description: < 4 with prns Outcome: Progressing   Problem: RH KNOWLEDGE DEFICIT SCI Goal: RH STG INCREASE KNOWLEDGE OF SELF CARE AFTER SCI Description: Patient and spouse will be able to mange care using educational resources for medication, spinal cord injury, dietary modifications and skin care independently Outcome: Progressing
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
## 2023-09-22 NOTE — Progress Notes (Signed)
 Patient ID: Brandon Watts, male   DOB: August 20, 1945, 78 y.o.   MRN: [id number]  SW made efforts to meet with pt to provide updates, however, nursing informed pt was in bathroom. SW will make efforts to follow-up.   Cecile Sheerer, MSW, LCSW Office: [phone] Cell: [phone] Fax: [phone]

## 2023-09-22 NOTE — Progress Notes (Signed)
 Occupational Therapy Session Note  Patient Details  Name: ARMAS MCBEE MRN: [id number] Date of Birth: 1945/11/05  Today's Date: 09/22/2023 OT Individual Time: 1030-1130 OT Individual Time Calculation (min): 60 min    Short Term Goals: Week 1:  OT Short Term Goal 1 (Week 1): Pt will perform toilet transfer with Mod A (x1) + LRAD. OT Short Term Goal 2 (Week 1): Pt will perform LB bathing with Mod A (x1) + LRAD. OT Short Term Goal 3 (Week 1): Pt will perform LB dressing with Mod A (x1) + LRAD. Week 2:     Skilled Therapeutic Interventions/Progress Updates:    1:1 Therapetuic activities to continue to work on functional mobility while monitoring BP. Upright in bed with abdominal binder and teds pt was 128/78 Ace wraps donned and pt came to EOB and BP was 123/77!! Then stood and BP dropped ot 88/50 and was symptomatic.  After sitting pt then 107/63. Performed 10 sit to stands wtihout UE support with min A and then BP was 130/68 in sitting Pt transferred stand step pivot to w/c and taken to dayroom for continued cardiopulmonary endurance and to work to continue to get him movement ot improve BP.  Performed (sitting in w/c unsupported) Kinentron with moderate resistance for ~ 5 min  BP 126/68 Then performed stand pivot from w/c to Nustep and performed 8 min without UE support on resistive level 2.  With BP maintain stability - performed ambulation ~ 20 feet and after walk 111/98. Pt required min to mod A with decr coordination of left foot with reported decr sensation and coordination .Ambulated again 2 more time ~20-25 feet each time with BP in sitting afterwards 108/71. Pt asymptomatic.   At end of session pt's BP taken in standing and BP was 87/49 and pt reports feeling "fine". Pt left sitting up in w/c in prep for lunch.   Therapy Documentation Precautions:  Precautions Precautions: Fall, Cervical Precaution/Restrictions Comments: Foley Required Braces or Orthoses: Cervical  Brace Cervical Brace: Hard collar, Soft collar Restrictions Weight Bearing Restrictions Per Provider Order: No General:   Vital Signs: Therapy Vitals Pulse Rate: (!) 57 BP: 128/78 Patient Position (if appropriate): Sitting Pain: PAINAD (Pain Assessment in Advanced Dementia) Breathing: normal Negative Vocalization: none Facial Expression: smiling or inexpressive Body Language: relaxed Consolability: no need to console PAINAD Score: 0   Therapy/Group: Individual Therapy  [name] [facility] 09/22/2023, 1:45 PM
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
## 2023-09-22 NOTE — Progress Notes (Signed)
 Speech Language Pathology Daily Session Note  Patient Details  Name: Brandon Watts MRN: [id number] Date of Birth: 1946/02/20  Today's Date: 09/22/2023 SLP Individual Time: 1400-1445 SLP Individual Time Calculation (min): 45 min  Short Term Goals: Week 1: SLP Short Term Goal 1 (Week 1): Pt will increase breath support for production of words at phrase level with 85% intelligibility min A SLP Short Term Goal 2 (Week 1): Pt will increase use of speech intelligibility strategies for phrases with  85% intelligibility min A SLP Short Term Goal 3 (Week 1): Pt will tolerate trials with pureed diet and thin liquids with no overt s/sx of aspiration or penetration on 9/10 trials. SLP Short Term Goal 4 (Week 1): Bedside swallow assessment to determine safest diet. SLP Short Term Goal 5 (Week 1): Pt will complete oral care prior to po intake to ensure good oral hygiene prior to po intake x100%  Skilled Therapeutic Interventions:   The pt, his wife, and a friend were greeted in his room. He was pleasant and cooperative throughout tx tasks targeting dysphagia. SLP provided extensive education re repeat MBSS and remaining pharyngeal swallow deficits. This included review of MBSS images themselves and conversation re improvement as compared to prev study. He verbalized understanding of all education and additional questions were answered. He then completed pharyngeal strengthening exercises x3 (masako, open mouth, and mendelsohn). 10 reps completed of each. He benefited from [facility] visual/verbal cues throughout to ensure adequate technique. At the end of tx tasks, he was left EOB with his wife assisting him as needed. Notified CNA of pt status upon departure. Recommend cont ST per POC.   Pain  No pain reported  Therapy/Group: Individual Therapy  [name] 09/22/2023, 3:32 PM

## 2023-09-22 NOTE — Progress Notes (Addendum)
 Physical Therapy Session Note  Patient Details  Name: Brandon Watts MRN: [id number] Date of Birth: 09/02/1945  Today's Date: 09/22/2023 PT Individual Time: 1308-6578 and [id number]  PT Individual Time Calculation (min): 45 min and 31 min  Short Term Goals: Week 1:  PT Short Term Goal 1 (Week 1): Pt will complete bed mobility with minA PT Short Term Goal 2 (Week 1): Pt will complete sit to stand with modA + LRAD PT Short Term Goal 3 (Week 1): Pt will complete SPT with modA + LRAD PT Short Term Goal 4 (Week 1): Pt will amb 58ft with modA + LRAD PT Short Term Goal 5 (Week 1): Pt will initiate stair training  Skilled Therapeutic Interventions/Progress Updates: Pt presented in bed with wife present agreeable to therapy. Pt c/o LUE pain but premedicated. Session focused on activity tolerance and BP management. PTA donned TED hose and socks total A and modA for donning shirt. Bed then lowered and pt performed bridge to lower pants that were already on and pull TED hose up thighs. BP assessed after donned 126/75 (91).  Pt then completed supine to sit via log roll technique and mod nearing minA. Upon sitting pt symptomatic BP 108/62 (76), and performed ankle pumps and LAQ to fatigue. Pt also performed AA L elbow flexion, BP assessed again and 99/58 (78) with pt becoming more symptomatic. Pt therefore returned to supine for BP management. At that time nsg arrived to inform pt that transport will be arriving at 9:15 for MBS. Pt agreeable therefore to complete supine therex. Pt performed bridges x 15, SAQ with green theraband resistance, and hip ER green theraband 2 x 15. Pt was able to perform bridges to scoot anteriorly to [facility] with minA. Pt left in bed at end of session with bed alarm on, call bell within reach and needs met.    Tx2: Pt presented in TIS with wife present agreeable to therapy. Pt states pain well controlled, continues to have pain in LUE. Pt noted to not have abdominal binder BP checked  124/72 (87). Pt transported to day room for energy conservation and set up with Fara Boros. Pt ambulated ~161ft around 4W nsg station with Carley Hammed walker and chair follow. Pt denies symptoms upon standing or during ambulation. Pt ambulating with CGA with narrow BOS and intermittent "kicking" out of LLE due to decreased proprioception. With fatigue pt also noted to have decreased L foot clearance however no toe catching. BP checked after ambulation 97/56 with pt asymptomatic. Due to dropping BP abdominal binder donned and PTA placed 4ln cuff on LLE for increased proprioceptive input. Pt then performed x 5 Sit to stand without AD and CGA. On last stand pt stating increased symptoms. Pt sitting however BP 134/79 (95). After 2 min symptoms resolved and pt requesting to ambulate again. Pt ambulated 131ft with CGA and noted improved step length and BOS with 4lb weight on LLE. In room pt sat EOB with abdominal binder doffed and pt performed sit to supine with light minA. PTA doffed shoes and pt left in bed at end of session with bed alarm on, call bell within reach and needs met.   Therapy Documentation Precautions:  Precautions Precautions: Fall, Cervical Precaution/Restrictions Comments: Foley Required Braces or Orthoses: Cervical Brace Cervical Brace: Hard collar, Soft collar Restrictions Weight Bearing Restrictions Per Provider Order: No General:   Vital Signs: Therapy Vitals Temp: 97.8 F (36.6 C) Temp Source: Oral Pulse Rate: 60 Resp: 16 BP: 128/82 Patient Position (if appropriate):  Sitting Oxygen Therapy SpO2: 97 % O2 Device: Room Air    Therapy/Group: Individual Therapy  Stephan Nelis 09/22/2023, 3:53 PM

## 2023-09-23 LAB — CBC
HCT: 36.7 % — ABNORMAL LOW (ref 39.0–52.0)
Hemoglobin: 12.8 g/dL — ABNORMAL LOW (ref 13.0–17.0)
MCH: 32.7 pg (ref 26.0–34.0)
MCHC: 34.9 g/dL (ref 30.0–36.0)
MCV: 93.9 fL (ref 80.0–100.0)
Platelets: 190 10*3/uL (ref 150–400)
RBC: 3.91 MIL/uL — ABNORMAL LOW (ref 4.22–5.81)
RDW: 11.4 % — ABNORMAL LOW (ref 11.5–15.5)
WBC: 13 10*3/uL — ABNORMAL HIGH (ref 4.0–10.5)
nRBC: 0 % (ref 0.0–0.2)

## 2023-09-23 LAB — COMPREHENSIVE METABOLIC PANEL
ALT: 59 U/L — ABNORMAL HIGH (ref 0–44)
AST: 25 U/L (ref 15–41)
Albumin: 3 g/dL — ABNORMAL LOW (ref 3.5–5.0)
Alkaline Phosphatase: 59 U/L (ref 38–126)
Anion gap: 3 — ABNORMAL LOW (ref 5–15)
BUN: 22 mg/dL (ref 8–23)
CO2: 32 mmol/L (ref 22–32)
Calcium: 10.3 mg/dL (ref 8.9–10.3)
Chloride: 100 mmol/L (ref 98–111)
Creatinine, Ser: 0.83 mg/dL (ref 0.61–1.24)
GFR, Estimated: >60 mL/min (ref >60)
Glucose, Bld: 157 mg/dL — ABNORMAL HIGH (ref 70–99)
Potassium: 4.1 mmol/L (ref 3.5–5.1)
Sodium: 135 mmol/L (ref 135–145)
Total Bilirubin: 0.5 mg/dL (ref 0.0–1.2)
Total Protein: 5.7 g/dL — ABNORMAL LOW (ref 6.5–8.1)

## 2023-09-23 LAB — URINALYSIS, ROUTINE W REFLEX MICROSCOPIC
Bilirubin Urine: NEGATIVE
Glucose, UA: NEGATIVE mg/dL
Hgb urine dipstick: NEGATIVE
Ketones, ur: NEGATIVE mg/dL
Leukocytes,Ua: NEGATIVE
Nitrite: NEGATIVE
Protein, ur: NEGATIVE mg/dL
Specific Gravity, Urine: 1.01 (ref 1.005–1.030)
pH: 7 (ref 5.0–8.0)

## 2023-09-23 MED ORDER — LIDOCAINE HCL (PF) 1 % IJ SOLN
2.0000 mL | Freq: Once | INTRAMUSCULAR | Status: AC
  Administered 2023-09-23: 2 mL
  Filled 2023-09-23: qty 30

## 2023-09-23 MED ORDER — MIDODRINE HCL 5 MG PO TABS
5.0000 mg | ORAL_TABLET | Freq: Three times a day (TID) | ORAL | Status: DC
  Administered 2023-09-23 – 2023-09-25 (×8): 5 mg via ORAL
  Filled 2023-09-23 (×9): qty 1

## 2023-09-23 MED ORDER — LIDOCAINE HCL 1 % IJ SOLN
2.0000 mL | Freq: Once | INTRAMUSCULAR | Status: DC
  Filled 2023-09-23 (×2): qty 2

## 2023-09-23 MED ORDER — TRIAMCINOLONE ACETONIDE 40 MG/ML IJ SUSP
40.0000 mg | Freq: Once | INTRAMUSCULAR | Status: AC
  Administered 2023-09-23: 40 mg via INTRA_ARTICULAR
  Filled 2023-09-23: qty 1

## 2023-09-23 MED ORDER — QUETIAPINE FUMARATE 25 MG PO TABS
12.5000 mg | ORAL_TABLET | Freq: Every day | ORAL | Status: DC
  Administered 2023-09-23: 12.5 mg via ORAL
  Filled 2023-09-23: qty 1

## 2023-09-23 NOTE — Progress Notes (Addendum)
 Speech Language Pathology Daily Session Note  Patient Details  Name: Brandon Watts MRN: [id number] Date of Birth: Nov 27, 1945  Today's Date: 09/23/2023 SLP Individual Time: 1415-1440 SLP Individual Time Calculation (min): 25 min and Today's Date: 09/23/2023 SLP Missed Time: 20 Minutes Missed Time Reason: Patient fatigue;Nursing care  Short Term Goals: Week 1: SLP Short Term Goal 1 (Week 1): Pt will increase breath support for production of words at phrase level with 85% intelligibility min A SLP Short Term Goal 2 (Week 1): Pt will increase use of speech intelligibility strategies for phrases with  85% intelligibility min A SLP Short Term Goal 3 (Week 1): Pt will tolerate trials with pureed diet and thin liquids with no overt s/sx of aspiration or penetration on 9/10 trials. SLP Short Term Goal 4 (Week 1): Bedside swallow assessment to determine safest diet. SLP Short Term Goal 5 (Week 1): Pt will complete oral care prior to po intake to ensure good oral hygiene prior to po intake x100%  Skilled Therapeutic Interventions:   Pt greeted at bedside, however, nursing was completing bladder scan. Upon return, SLP facilitated tx tasks targeting dysphagia. Reviewed oropharyngeal swallow exercises prior to completion. He was able to complete the masako x10 and effortful swallow x5 before tasks were discontinued d/t fatigue. Fatigue also limited his ability to safely complete PO trials and tx session was concluded early. 20 mins missed total. He was left in bed with the alarm set and call light within reach. Recommend cont ST per POC.   Pain No pain reported  Therapy/Group: Individual Therapy  [name] 09/23/2023, 8:21 AM

## 2023-09-23 NOTE — Progress Notes (Signed)
 Met with patient to review current situation, team conferences and plan of care. My chart activated. Reviewed medications, sleep pattern, bowel and bladder. Continue to follow along to provide educational needs to facilitate preparation for discharge.
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
## 2023-09-23 NOTE — Progress Notes (Signed)
 Physical Therapy Session Note  Patient Details  Name: Brandon Watts MRN: [id number] Date of Birth: 02/08/1946  Today's Date: 09/23/2023 PT Individual Time: 1300-1345 PT Individual Time Calculation (min): 45 min   Short Term Goals: Week 1:  PT Short Term Goal 1 (Week 1): Pt will complete bed mobility with minA PT Short Term Goal 2 (Week 1): Pt will complete sit to stand with modA + LRAD PT Short Term Goal 3 (Week 1): Pt will complete SPT with modA + LRAD PT Short Term Goal 4 (Week 1): Pt will amb 73ft with modA + LRAD PT Short Term Goal 5 (Week 1): Pt will initiate stair training   Skilled Therapeutic Interventions/Progress Updates:    Co-treatment with Recreational therapy during session with focus on progressing upright tolerance, transfers, and gait. Pt performed bed mobility with CGA to come to EOB with HOB elevated and extra time. Donned c-collar, abdominal binder and shoes to prepare for session. BP assessed as indicated below. Pt generally rated dizziness at best 3-4/5 and increased to 6/10 at it's worst when he required to be semi reclined in TIS w/c.   Pt performed transfer with overall min assist for stand pivot without device with cues and facilitation for anterior weightshift as tendency with weight in heels. Blocked practice sit <> stands x 5 reps without device with focus on anterior weightshift and achieving balance in upright position. Progressed to gait training with RW x 150' with min assist overall with cues for upright posture, occasional LLE clearance, and safe positioning of RW -seated break x 2 due to sharp abdominal cramps after another 15-20' each bout. Pt also noted to have had BM and returned to room to toilet (pt was unsure initially if just gas or BM but once to toilet, had already gone in brief and then continued on toilet). Handed off to NT.   BP seated EOB = 135/74 (MAP = 90); HR = 76 bpm Rated 4/10 After transfer to w/c = 127/56 mmHg (MAP = 74); HR = 79 bpm  Rated 4-5/10 After standing = 118/69 (MAP = 83); Rated 6/10  Then resolved back to rating of 3/10 at baseline and only monitored via symptoms at that point.   Therapy Documentation Precautions:  Precautions Precautions: Fall, Cervical Precaution/Restrictions Comments: Foley Required Braces or Orthoses: Cervical Brace Cervical Brace: Hard collar, Soft collar Restrictions Weight Bearing Restrictions Per Provider Order: No  Pain: Reports pain is ok - LUE sensitive to light touch.     Therapy/Group: Individual Therapy  Karolee Stamps Darrol Poke, PT, DPT, CBIS  09/23/2023, 2:43 PM

## 2023-09-23 NOTE — Progress Notes (Signed)
 Occupational Therapy Session Note  Patient Details  Name: Brandon Watts MRN: [id number] Date of Birth: [birth year]  Today's Date: 09/23/2023 OT Individual Time: 4098-1191 OT Individual Time Calculation (min): 45 min  and Today's Date: 09/23/2023 OT Missed Time: 30 Minutes Missed Time Reason: Nursing care   Short Term Goals: Week 1:  OT Short Term Goal 1 (Week 1): Pt will perform toilet transfer with Mod A (x1) + LRAD. OT Short Term Goal 2 (Week 1): Pt will perform LB bathing with Mod A (x1) + LRAD. OT Short Term Goal 3 (Week 1): Pt will perform LB dressing with Mod A (x1) + LRAD.  Skilled Therapeutic Interventions/Progress Updates:    Pt resting in bed upon arrival. RN and student nurses present.Pt missed 30 mins skilled OT services at beginning of services 2/2 nursing care. Initial focus on bed mobility (bridging) to doff pants to facilitate OTA applying Ted hose and BLE Ace wraps. Pt dependent for donning pants at bed level. Supine>sit EOB with min A. Pt able to complete reciprocal scooting to EOB. Sit<>stand from EOB with min A. Standi pivot transfer to w/c with min A (HHA). Pt able to scoot back into seat of w/c without assistance. Pt provided sufficient time sitting EOB for BP to stabilize. Abdominal binder donned while pt sitting EOB.  BP:  Supine 137/65 (87( HR 64 Sitting EOB 126/70 (85) HR 66 Unable to obtain BP while standing.  Pt verbalized slight s/s when sitting EOB and after trnasfer to w/c but resolved both time after approx 50 secs.  Pt remained in TIS w/c with all needs within reach.   Therapy Documentation Precautions:  Precautions Precautions: Fall, Cervical Precaution/Restrictions Comments: Foley Required Braces or Orthoses: Cervical Brace Cervical Brace: Hard collar, Soft collar Restrictions Weight Bearing Restrictions Per Provider Order: No General: General OT Amount of Missed Time: 30 Minutes   Pain:  Pt reports ongoing Lt shoulder pain and  hypersensitivity in addition to Rt shoulder pain (unrated); meds admin during therapy by RN    Therapy/Group: Individual Therapy  [name] 09/23/2023, 11:49 AM

## 2023-09-23 NOTE — Progress Notes (Signed)
 Physical Therapy Session Note  Patient Details  Name: Brandon Watts MRN: [id number] Date of Birth: [birth year]  Today's Date: 09/23/2023 PT Individual Time: 1000-1045 PT Individual Time Calculation (min): 45 min   Short Term Goals: Week 1:  PT Short Term Goal 1 (Week 1): Pt will complete bed mobility with minA PT Short Term Goal 2 (Week 1): Pt will complete sit to stand with modA + LRAD PT Short Term Goal 3 (Week 1): Pt will complete SPT with modA + LRAD PT Short Term Goal 4 (Week 1): Pt will amb 44ft with modA + LRAD PT Short Term Goal 5 (Week 1): Pt will initiate stair training  Skilled Therapeutic Interventions/Progress Updates:    Pt seated in w/c on arrival and agreeable to therapy. Pt reports unrated pain in shoulders, assist for positioning PRN and pt discussed steroid injection with MD during session.   Pt transported to therapy gym for time management and energy conservation. Session focused on gait with eva walker. Monitored dizziness with numeric scale, with pt reporting up to 5/10 after gait and up to 6/10 with vertical saccades. Pt would benefit from vestibular eval to parse out central/peripheral vestibular symptoms vs orthostasis.    Pt ambulated with eva walker x 3 bouts, x 120 ft, x 150 ft, and >200 ft back to room from ADL apartment. Pt with step WNL gait pattern and requiring min a to CGA for balance with no assist for AD management. W/c follow in case of OH.   Pt then returned to bed with min a and VC. Discussed positioning in bed and rolling for comfort overnight. Pt able to roll with supervision and verbal cueing. Reports he gets uncomfortable very quickly and needs help repositioning. Discussed ways to ask for exactly the assist he needs since he can roll without physical assist and only needs assist to rearrange pillows. Pt remained in bed and was left with all needs in reach and alarm active.   Therapy Documentation Precautions:  Precautions Precautions: Fall,  Cervical Precaution/Restrictions Comments: Foley Required Braces or Orthoses: Cervical Brace Cervical Brace: Hard collar, Soft collar Restrictions Weight Bearing Restrictions Per Provider Order: No General:       Therapy/Group: Individual Therapy  Brandon Watts 09/23/2023, 4:02 PM

## 2023-09-24 LAB — URINE CULTURE
Culture: NO GROWTH
Special Requests: NORMAL

## 2023-09-24 MED ORDER — LIDOCAINE HCL URETHRAL/MUCOSAL 2 % EX GEL
1.0000 | CUTANEOUS | Status: DC
  Administered 2023-09-24 – 2023-10-06 (×15): 1 via URETHRAL
  Filled 2023-09-24 (×16): qty 6

## 2023-09-24 MED ORDER — GLUCAGON HCL RDNA (DIAGNOSTIC) 1 MG IJ SOLR
INTRAMUSCULAR | Status: AC
  Filled 2023-09-24: qty 1

## 2023-09-24 MED ORDER — QUETIAPINE FUMARATE 25 MG PO TABS
25.0000 mg | ORAL_TABLET | Freq: Every day | ORAL | Status: DC
  Administered 2023-09-24 – 2023-10-10 (×17): 25 mg via ORAL
  Filled 2023-09-24 (×17): qty 1
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
## 2023-09-24 NOTE — Progress Notes (Signed)
 Physical Therapy Session Note  Patient Details  Name: Brandon Watts MRN: [id number] Date of Birth: [birth year]  Today's Date: 09/24/2023 PT Individual Time: 1050-1200 PT Individual Time Calculation (min): 70 min   Short Term Goals: Week 1:  PT Short Term Goal 1 (Week 1): Pt will complete bed mobility with minA PT Short Term Goal 2 (Week 1): Pt will complete sit to stand with modA + LRAD PT Short Term Goal 3 (Week 1): Pt will complete SPT with modA + LRAD PT Short Term Goal 4 (Week 1): Pt will amb 13ft with modA + LRAD PT Short Term Goal 5 (Week 1): Pt will initiate stair training  Skilled Therapeutic Interventions/Progress Updates: Pt presented in TIS with wife present agreeable to therapy. Pt states increased pain in B shoulders, premedicated and rest and repositioning provided during session as needed. Session focused on functional mobility and conditioning. Pt transported to day room and completed stand pivot transfer with HHA to NuStep. Participated in NuStep with BLE and RUE with varying levels from L3-5 for increased intensity. BP checked after NuStep 137/60 (84) HR 61. Pt returned to TIS in same manner as prior. Pt then worked on [employer], operational with RW. Pt ambulated initially 66ft with RW and CGA for straight path and minA for turns. PTA set up cones and pt worked on [employer] through cones 70ft x 2 with pt requiring light minA. Pt with x 1 LOB to L when turning to R requiring modA for correction. At mat pt also worked on toe taps to 4in step 2 x 10 alternating. Pt noted to maintain good balance with no significant increase in dizziness. After brief rest pt then ambulated ~134ft with RW and CGA (chair follow for safety) to main gym. Pt continues to demonstrate narrow BOS and slightly decreased coordination of LLE but no LOB noted. In main gym at parallel bars pt initiated stair training. Pt performed step ups leading with RLE x 10 on 4in step then progressed to step overs. Pt noted  to show good control therefore progressed to 6in step which pt was able to perform with CGA and use of B rails. Pt then performed step ups with R/LLE x 10 each. Pt also performed lateral step ups to 4in step with UE support and CGA. After seated rest pt then ambulated back to room **ft in same manner as prior. Pt requiring min cues for pacing. In room pt transferred at bed to supine with supervision. Pt repositioned to comfort and left in bed at end of session with call bell within reach, wife present and needs met.      Therapy Documentation Precautions:  Precautions Precautions: Fall, Cervical Precaution/Restrictions Comments: Foley Required Braces or Orthoses: Cervical Brace Cervical Brace: Hard collar, Soft collar Restrictions Weight Bearing Restrictions Per Provider Order: No General:   Vital Signs:   Pain:   Mobility:   Locomotion :    Trunk/Postural Assessment :    Balance:   Exercises:   Other Treatments:      Therapy/Group: Individual Therapy  Melaina Howerton 09/24/2023, 12:57 PM

## 2023-09-24 NOTE — Progress Notes (Signed)
 Occupational Therapy Weekly Progress Note  Patient Details  Name: Brandon Watts MRN: [id number] Date of Birth: 1945/12/31  Beginning of progress report period: September 18, 2023 End of progress report period: September 24, 2023  Patient has met 1 of 3 short term goals.  Pt progress has been slow but steady since admission. Pt continues to demo OH with all transitional movements, especially when standing, which has hindered pt's ability to complete dressing tasks exvept at bed level. Pt recently was able to tolerate sitting EOB to complete UB/LB dressing tasks with max A. Pt's BP not stable without Ted hose, Ace wraps, and abdominal binder and therefore pt has not completed bathing at shower level. Pt continues to report LUE hypersensitivity and shoulder pain but has begun using LUE to assist with dressing tasks. Pt's family has not been present for therapy.   Patient continues to demonstrate the following deficits: muscle weakness, decreased cardiorespiratoy endurance, impaired timing and sequencing, unbalanced muscle activation, decreased coordination, and decr BP, decreased memory, and decreased standing balance, decreased postural control, and decreased balance strategies and therefore will continue to benefit from skilled OT intervention to enhance overall performance with BADL and Reduce care partner burden.  Patient progressing toward long term goals..  Continue plan of care.  OT Short Term Goals Week 1:  OT Short Term Goal 1 (Week 1): Pt will perform toilet transfer with Mod A (x1) + LRAD. OT Short Term Goal 1 - Progress (Week 1): Met OT Short Term Goal 2 (Week 1): Pt will perform LB bathing with Mod A (x1) + LRAD. OT Short Term Goal 2 - Progress (Week 1): Progressing toward goal OT Short Term Goal 3 (Week 1): Pt will perform LB dressing with Mod A (x1) + LRAD. OT Short Term Goal 3 - Progress (Week 1): Progressing toward goal Week 2:  OT Short Term Goal 1 (Week 2): Pt will perform LB bathing  with Mod A (x1) + LRAD. OT Short Term Goal 2 (Week 2): Pt will perform LB dressing with Mod A (x1) + LRAD. OT Short Term Goal 3 (Week 2): Pt will complete toileting tasks with mod A OT Short Term Goal 4 (Week 2): Pt will perform UB dressing tasks with mod A   [name] 09/24/2023, 2:36 PM

## 2023-09-24 NOTE — Progress Notes (Signed)
 Occupational Therapy Session Note  Patient Details  Name: Brandon Watts MRN: [id number] Date of Birth: Oct 02, 1945  Today's Date: 09/24/2023 OT Individual Time: 8295-6213 OT Individual Time Calculation (min): 70 min    Short Term Goals: Week 1:  OT Short Term Goal 1 (Week 1): Pt will perform toilet transfer with Mod A (x1) + LRAD. OT Short Term Goal 2 (Week 1): Pt will perform LB bathing with Mod A (x1) + LRAD. OT Short Term Goal 3 (Week 1): Pt will perform LB dressing with Mod A (x1) + LRAD.  Skilled Therapeutic Interventions/Progress Updates:    Pt sleeping upon arrival but easily aroused. OT intervention with focus on bed mobility, dressing at EOB, sit<>stand, standing balance, and stand pivot transfers to w/c. OTA applied Ted hose and BLE Ace wraps prior to sitting EOB. Focus on sitting balance to don pullover shirt and thread pants. Max A for doffing/donning pull over shirt. Sit<>stand with HHA from EOB after pants threaded. Pt required assistance to pull pants over hips. Pt able to thread BLE with mod A. Lightheadedness noted with transitional movements and standing. Stand pivot transfer to w/c with HHA  BP: Supine: 138/63 HR 49 Sitting without ABD binder: 101/54 HR 52 Sitting with binder: 113/60 HR 50  Sitting with binder after 5 mins: 105/57 HR 51 Sitting in w/c after stand pivot transfer: 116/65 HR 52  Pt required extensice rest breaks between transtional movments and standing 2/2 lightheadedness.  Pt remained in w/c with all needs within reach.   Therapy Documentation Precautions:  Precautions Precautions: Fall, Cervical Precaution/Restrictions Comments: Foley Required Braces or Orthoses: Cervical Brace Cervical Brace: Hard collar, Soft collar Restrictions Weight Bearing Restrictions Per Provider Order: No   Pain:  Pt reports 5/10 Lt shoulder pain with hypersensitvity; repositioned  Therapy/Group: Individual Therapy  [name] 09/24/2023, 9:31 AM

## 2023-09-24 NOTE — Progress Notes (Signed)
 Speech Language Pathology Daily Session Note  Patient Details  Name: Brandon Watts MRN: [id number] Date of Birth: [birth year]  Today's Date: 09/24/2023 SLP Individual Time: 1345-1430 SLP Individual Time Calculation (min): 45 min  Short Term Goals: Week 1: SLP Short Term Goal 1 (Week 1): Pt will increase breath support for production of words at phrase level with 85% intelligibility min A SLP Short Term Goal 2 (Week 1): Pt will increase use of speech intelligibility strategies for phrases with  85% intelligibility min A SLP Short Term Goal 3 (Week 1): Pt will tolerate trials with pureed diet and thin liquids with no overt s/sx of aspiration or penetration on 9/10 trials. SLP Short Term Goal 4 (Week 1): Bedside swallow assessment to determine safest diet. SLP Short Term Goal 5 (Week 1): Pt will complete oral care prior to po intake to ensure good oral hygiene prior to po intake x100%  Skilled Therapeutic Interventions:   Pt and wife greeted at bedside. He was awake/alert upon SLP arrival and very pleasant throughout tx tasks targeting swallowing. He completed oropharyngeal swallow exercises x3 (masako, mendelsohn, open mouth swallow). 10 reps completed of the open mouth/mendelsohn and 15 reps completed of the masako. He benefited from minA cues throughout to ensure adequate technique and max potential of each exercise. Intermittent throat clearing remained evident during PO trials of thin liquids. Improved vocal quality (less wet) and secretion management as compared to prev tx sessions. He continues to require intermittent rest breaks d/t fatigue and presented w/ increased difficulty remaining awake as tasks continued. At the end of tx tasks, he was left in bed with the alarm set and call light within reach. Recommend cont ST per POC.   Pain Pain Assessment Pain Scale: 0-10 Pain Score: 5  Pain Location: Shoulder Pain Intervention(s): Medication (See eMAR)  Therapy/Group: Individual  Therapy  [name] 09/24/2023, 3:54 PM

## 2023-09-25 LAB — CBC WITH DIFFERENTIAL/PLATELET
Abs Immature Granulocytes: 0.04 10*3/uL (ref 0.00–0.07)
Basophils Absolute: 0 10*3/uL (ref 0.0–0.1)
Basophils Relative: 1 %
Eosinophils Absolute: 0.2 10*3/uL (ref 0.0–0.5)
Eosinophils Relative: 3 %
HCT: 38.1 % — ABNORMAL LOW (ref 39.0–52.0)
Hemoglobin: 12.9 g/dL — ABNORMAL LOW (ref 13.0–17.0)
Immature Granulocytes: 1 %
Lymphocytes Relative: 27 %
Lymphs Abs: 2 10*3/uL (ref 0.7–4.0)
MCH: 32.5 pg (ref 26.0–34.0)
MCHC: 33.9 g/dL (ref 30.0–36.0)
MCV: 96 fL (ref 80.0–100.0)
Monocytes Absolute: 0.9 10*3/uL (ref 0.1–1.0)
Monocytes Relative: 11 %
Neutro Abs: 4.3 10*3/uL (ref 1.7–7.7)
Neutrophils Relative %: 57 %
Platelets: 204 10*3/uL (ref 150–400)
RBC: 3.97 MIL/uL — ABNORMAL LOW (ref 4.22–5.81)
RDW: 11.6 % (ref 11.5–15.5)
WBC: 7.5 10*3/uL (ref 4.0–10.5)
nRBC: 0 % (ref 0.0–0.2)

## 2023-09-25 LAB — BASIC METABOLIC PANEL
Anion gap: 8 (ref 5–15)
BUN: 24 mg/dL — ABNORMAL HIGH (ref 8–23)
CO2: 29 mmol/L (ref 22–32)
Calcium: 10.9 mg/dL — ABNORMAL HIGH (ref 8.9–10.3)
Chloride: 102 mmol/L (ref 98–111)
Creatinine, Ser: 0.93 mg/dL (ref 0.61–1.24)
GFR, Estimated: >60 mL/min (ref >60)
Glucose, Bld: 102 mg/dL — ABNORMAL HIGH (ref 70–99)
Potassium: 4.6 mmol/L (ref 3.5–5.1)
Sodium: 139 mmol/L (ref 135–145)

## 2023-09-25 MED ORDER — CHLORHEXIDINE GLUCONATE CLOTH 2 % EX PADS
6.0000 | MEDICATED_PAD | Freq: Two times a day (BID) | CUTANEOUS | Status: DC
  Administered 2023-09-26 (×2): 6 via TOPICAL

## 2023-09-25 MED ORDER — BIOTENE DRY MOUTH MT LIQD
15.0000 mL | Freq: Three times a day (TID) | OROMUCOSAL | Status: DC
  Administered 2023-09-25 – 2023-10-11 (×46): 15 mL via OROMUCOSAL

## 2023-09-25 MED ORDER — LIDOCAINE HCL URETHRAL/MUCOSAL 2 % EX GEL
1.0000 | Freq: Once | CUTANEOUS | Status: DC
  Filled 2023-09-25: qty 6

## 2023-09-25 MED ORDER — BISACODYL 10 MG RE SUPP: 10.0000 mg | Freq: Once | RECTAL | Status: DC

## 2023-09-25 NOTE — Progress Notes (Signed)
 High volumes noted and reported to MD. Caths today q 4.5 (800 mL) and then 5 hours (600 mL), Attempted last cath at 1930 but unable to get urine return, met resistance and small amount of blood. Reported off to night charge who contacted PA, orders given.

## 2023-09-25 NOTE — Plan of Care (Signed)
  Problem: SCI BOWEL ELIMINATION Goal: RH STG MANAGE BOWEL WITH ASSISTANCE Description: STG Manage Bowel with mod I Assistance. Outcome: Progressing

## 2023-09-25 NOTE — Progress Notes (Signed)
 PROGRESS NOTE   Subjective/Complaints:  High cath volumes , discussed fluid restriction ,  states he has dry mouth and sips fluid frequently  Discussed labs  ROS:   Pt denies SOB, abd pain, CP, N/V/C/D, and vision changes   restlessness (+)-better not resolved L>R shoulder pain R shoulder somewhat better Objective:   No results found.   Recent Labs    09/23/23 0614 09/25/23 0527  WBC 13.0* 7.5  HGB 12.8* 12.9*  HCT 36.7* 38.1*  PLT 190 204   Recent Labs    09/23/23 0614 09/25/23 0527  NA 135 139  K 4.1 4.6  CL 100 102  CO2 32 29  GLUCOSE 157* 102*  BUN 22 24*  CREATININE 0.83 0.93  CALCIUM 10.3 10.9*    Intake/Output Summary (Last 24 hours) at 09/25/2023 1029 Last data filed at 09/25/2023 0811 Gross per 24 hour  Intake 200 ml  Output 4400 ml  Net -4200 ml        Physical Exam: Vital Signs Blood pressure 111/63, pulse (!) 51, temperature (!) 97.5 F (36.4 C), temperature source Oral, resp. rate 18, height 5\' 8"  (1.727 m), weight 78.8 kg, SpO2 97%.        General: awake, alert, appropriate, supine in bed; NAD HENT: conjugate gaze; oropharynx very dry- soft collar in place; cortrak out;  CV: regular rhythm, rate slightly bradycardic; no JVD Pulmonary: CTA B/L; no W/R/R- good air movement GI: soft, NT, ND, (+)BS Psychiatric: appropriate- interactive, pleasant Neurological: able to discuss home meds, sleep , and labwork  MSK: RUE- Biceps 4-/5; WE 4/5; Triceps 4-/5; Grip 3+ to 4-/5, FA 3+/5 LUE_ Biceps 2+/5; WE 3+/5; Triceps 2+/5; Grip 2+/5; and FA 2/5 less  TTP over R anterior shoulder with now no pain with resisted flexion- trp palpated in R upper trap- same pattern in L shoulder as well  Skin: C/D/I. No apparent lesions.IV intact   MSK:      No apparent deformity. R shoulder AROM in abduction limited to <90 degrees. Non-painful. Left arm is extremely tender to touch and PROM        Neurologic exam:  Cognition: AAO to person, place, time and event?.  Language: Fluent, Mild dysarthria.   Insight: Fair insight into current condition.    Sensation: To light touch hypersensitive in LUE at shoulder  Reflexes :Negative Hoffman's and babinski signs bilaterally.  CN: 2-12 grossly intact.  Coordination: No apparent tremors. Weakness limits FNF on left ; + Mild LLE HTS Spasticity: MAS 0 in all extremities. Flaccid LUE.       Assessment/Plan: 1. Functional deficits which require 3+ hours per day of interdisciplinary therapy in a comprehensive inpatient rehab setting. Physiatrist is providing close team supervision and 24 hour management of active medical problems listed below. Physiatrist and rehab team continue to assess barriers to discharge/monitor patient progress toward functional and medical goals  Care Tool:  Bathing    Body parts bathed by patient: Chest, Abdomen   Body parts bathed by helper: Buttocks, Right lower leg, Left lower leg, Right arm, Left arm, Front perineal area, Right upper leg, Left upper leg, Face     Bathing assist Assist Level: Maximal  Assistance - Patient 24 - 49%     Upper Body Dressing/Undressing Upper body dressing   What is the patient wearing?: Pull over shirt    Upper body assist Assist Level: Maximal Assistance - Patient 25 - 49%    Lower Body Dressing/Undressing Lower body dressing      What is the patient wearing?: Pants     Lower body assist Assist for lower body dressing: Total Assistance - Patient < 25%     Toileting Toileting    Toileting assist Assist for toileting: Total Assistance - Patient < 25%     Transfers Chair/bed transfer  Transfers assist     Chair/bed transfer assist level: Minimal Assistance - Patient > 75%     Locomotion Ambulation   Ambulation assist   Ambulation activity did not occur: Safety/medical concerns (2/2 endurance deficit/ limited activity tolerance)  Assist level: 2  helpers Assistive device: Walker-rolling Max distance: 150   Walk 10 feet activity   Assist  Walk 10 feet activity did not occur: Safety/medical concerns (2/2 endurance deficit/ limited activity tolerance)  Assist level: Minimal Assistance - Patient > 75% Assistive device: Walker-rolling   Walk 50 feet activity   Assist Walk 50 feet with 2 turns activity did not occur: Safety/medical concerns (2/2 endurance deficit/ limited activity tolerance)  Assist level: 2 helpers (w/c follow due to BP - min A)      Walk 150 feet activity   Assist Walk 150 feet activity did not occur: Safety/medical concerns (2/2 endurance deficit/ limited activity tolerance)  Assist level: 2 helpers      Walk 10 feet on uneven surface  activity   Assist Walk 10 feet on uneven surfaces activity did not occur: Safety/medical concerns (2/2 endurance deficit/ limited activity tolerance)         Wheelchair     Assist Is the patient using a wheelchair?: Yes Type of Wheelchair: Manual Wheelchair activity did not occur: Safety/medical concerns (2/2 endurance deficit/ limited activity tolerance)         Wheelchair 50 feet with 2 turns activity    Assist    Wheelchair 50 feet with 2 turns activity did not occur: Safety/medical concerns (2/2 endurance deficit/ limited activity tolerance)       Wheelchair 150 feet activity     Assist  Wheelchair 150 feet activity did not occur: Safety/medical concerns (2/2 endurance deficit/ limited activity tolerance)       Blood pressure 111/63, pulse (!) 51, temperature (!) 97.5 F (36.4 C), temperature source Oral, resp. rate 18, height 5\' 8"  (1.727 m), weight 78.8 kg, SpO2 97%.   Medical Problem List and Plan: 1. Functional deficits due to SCI (central cord syndrome)- C4 ASIA C/D (incomplete quadriplegia)  with C4 fracture s/p arthrodesis C3-4 anterior interbody technique discectomy for decompression of spinal cord and exiting nerve roots  with foraminotomies as well as additional level C4-5 interbody technique for decompression placement of anterior instrumentation consisting of interbody plate and screws C3-4-5 09/09/2023.  Cervical collar at all times             -patient may not shower             -ELOS/Goals: 18 to 21 days, min assist PT/OT/SLP                            -On admission Dr. Maisie Fus cleared patient for soft collar when in bed for comfort; ortho tech order placed for  soft collar and nursing order for soft collar in bed, alternating to hard collar when OOB   - will allow dog to come in from home with papers  - not ready for grounds pass quite yet  Started estim D/c 3/31 Con't CIR PT, OT and SLP 2.  Antithrombotics: -DVT/anticoagulation/segmental LUL PE:  Pharmaceutical: Lovenox initiated 09/11/2023 and titrated.  Neurosurgery Dr. Maisie Fus stating can switch to DOAC 3/7, since last hemoglobin 3/5 will wait until a.m. labs and switch if stable 3/8- pt refused labs this AM- will wait til Monday when have labs 3/12- Switched to Eliquis 5 mg BID from Tx dose lovenox             -antiplatelet therapy: N/A   3. Pain Management: OxyContin sustained release 10 mg every 12 hours, Neurontin 300 mg 3 times daily Robaxin as needed.  Will add tramadol as needed   3/8- pt reports pain usually controlled- con't regimen  3/10--still having a lot of LUE--increase gabapentin to 400mg  tid  3/11- change Tylenol to 1000 mg q8 hours per pt request  3/12- has been helpful to add tylenol 4. Mood/Behavior/Sleep/Hallucinations: Melatonin 5 mg nightly.  Xanax Dced on admission per family d/t confusion.              - antipsychotic agents: Seroquel 12.5-25 mg PRN              - Delirium precautions with stimulation during the daytime, quiet with low lights an minimal interruptions at nighttime, limit lines/tubes/drains to essential access only, limit physical and chemical restraints               - Melatonin 5 mg at bedtime, trazodone 25-50 mg  PRN.  - May benefit from standing at bedtime seroquel if trazodone ineffective.    3/8- slept OK- will con't regimen for now  3/10 continues to struggle with sleep, partially due to pain    -gabapentin increased above   -will schedule trazodone @2100  with back up dose prn       -pt voiced some feeling of depression over his accident/ condition -he is interested in speaking with neuropsychology. -will ask Dr. Kieth Brightly to see patient regarding reactive depression. 3/140- added Seroquel yesterday for psychomotor agitation- will increase to 25 mg at bedtime tonight  5. Neuropsych/cognition/small SDH: This patient is not capable of making decisions on his own behalf.              - telesitter continued --renewed     6. Skin/Wound Care: Routine skin checks 7. Fluids/Electrolytes/Nutrition/hyponatremia: Routine in and outs with follow-up chemistries    Latest Ref Rng & Units 09/25/2023    5:27 AM 09/23/2023    6:14 AM 09/20/2023    6:18 AM  BMP  Glucose 70 - 99 mg/dL 956  213  97   BUN 8 - 23 mg/dL 24  22  17    Creatinine 0.61 - 1.24 mg/dL 0.86  5.78  4.69   Sodium 135 - 145 mmol/L 139  135  134   Potassium 3.5 - 5.1 mmol/L 4.6  4.1  4.2   Chloride 98 - 111 mmol/L 102  100  99   CO2 22 - 32 mmol/L 29  32  30   Calcium 8.9 - 10.3 mg/dL 62.9  52.8  9.9     8.  Dysphagia. Cortrak placed 3/5.   Currently on a mechanical soft diet with MBS pending Advance diet as tolerated per speech therapy              -  On TF with 50 ml/hr osmolite 1.5 + prosource 60 ml daily              - Dietary consulted for management   3/8- will add IVFs, pt insistent- for dehydration- refused labs today so cannot see Cr/BUN to assess- but says not drinking enough  3/10--changed ivf to free water thru tube--encourage PO as well  **update 4:30pm--pt cleared for D2/thins per SLP!   -RD transitioning to nocturnal feeds beginning at 0500 tomorrow  3/11- fed self breakfast this Am and dinner last night for first  time  3/12- con't cortrak  3/13- Got OK by Dietician to remove Cortrak and is on D3 thin diet, so no need for steroids- and would probably do better if remove Cortrak- will remove today and monitor  3/14- doing ok without COrtrak 9.  Hypertension with Orthostatic hypotension.  Cozaar 50 mg daily.  Monitor with increased mobility.              - Holding home hydrochlorothiazide    3/8- BP 110s- a little soft- monitor for Orthostatic hypotension  3/11- will decrease Cozaar to 25 mg daily and add Midodrine 2.5 mg TID with meals- to start at 6:30am since almost passed out 2x per PT.   3/12- BP dropped to 58 systolic yesterday-added Midodrine- BP is slightly elevated in supine, but dropping dramatically in spite of TEDs and ACE wraps   3/13- Increased Midodrine to 5 mg TID- will con't Abd binder, TEDs and ACE wraps.   3/14- BP did better with OT today lowest low 100s systolic, with abd binder, TED's, ACE wraps and midodrine Vitals:   09/24/23 1917 09/25/23 0520  BP: 112/61 111/63  Pulse: 67 (!) 51  Resp: 17 18  Temp: 97.9 F (36.6 C) (!) 97.5 F (36.4 C)  SpO2: 98% 97%    10.  BPH with likely neurogenic bladder.  Proscar 5 mg nightly.  Check PVR             - Has coude catheter placed 2/27, patient hesitant to remove, OK to keep tonight and would re-address in AM - states he needs to be able to stand to urinate d/t BPH   3/8- explained can wait 2 days, but then have to remove foley-   3/10---remove foley--voiding trial  3/12- Requiring caths- no real voiding so far - will add Urecholine, because OH makes it difficult to add Flomax. Add 5 mg TID for now- monitor  3/13- will wait on Flomax- until BP doing better- bladder scans and caths up to 1000cc- needs to do every 4 hours based on cath results-d/w nursing 3/14- wrote order for coude' caths only and lidocaine jelly with caths 11.  Constipation with likely neurogenic bowel.   No BM since admission               - Miralax 17 g BID, sennakot S  2 tabs BID              - PRN sorbitol for moderate constipation, PRN fleet enema for severe constipation   3/8- will give 45 cc Sorbitol and if no results, Soap suds enema this evening  3/10- bm's continent yesterday--type 6--no changes to regimen today  3/11- Strained but not able to go this AM- will montior  3/12- LBM yesterday per pt- but 3/9 per chart- will check with therapy- if no results, will need more intervention- already on Miralax BID, Senna 2 tabs BID  3/13- LBM yesterday  3/14- LBM 2 days ago 12.  Acute respiratory failure with hypoxia/bilateral pneumonia.  Continue cefepime x 7 days.  Pulmonary services have signed off.              - Ongoing significant secretions with frequent suctioning              - Robitussin 5 mg Q6H             - Biotene PRN   3/8- running Cefepime now- con't 7 days total  3/9- CXR looks good- only mild L sided atelectasis - added Scopolamine due to increased secretions for a few days only  3/12- changed Duonebs back to QID from prn  3/13- improved secretions with Scopolamine - this may be contributing to retention no issues with secretions consider d/c  13. L shoulder weakness and pain and new R shoulder pain.  No fracture on xray 2/26.              - Likely due to L C4 myelopathy; however no accompanying sensory deficit and no other weakness. I suspect a radicular component also  - Monitor with therapies; would consider MRI shoulder for rotator cuff pathology if no improvement   3/13- Pt's main pain is anterior and upper trap on L side- will con't to monitor- doesn't want steroid injections at this time- R shoulder pain started overnight from pulling in bed- appears to be biceps tendinitis- wants to wait on steroids due to concern with hallucinations  3/14- ended up doing steroid injection with 20mg  of kenalog- pain doing better 14. Restlessness/psychomotor agitation  3/13- will try Seroquel 12.5 mg at bedtime- d/w TBI MD and she agreed it's better  than Depakote.   3/14- increasing seroquel to 25 mg QHS  Sleeping ok, now on seroquel , d/c trazodone, anticholinergic effect       LOS: 8 days A FACE TO FACE EVALUATION WAS PERFORMED  Erick Colace 09/25/2023, 10:29 AM

## 2023-09-26 DIAGNOSIS — S065XAA Traumatic subdural hemorrhage with loss of consciousness status unknown, initial encounter: Secondary | ICD-10-CM

## 2023-09-26 DIAGNOSIS — S14129D Central cord syndrome at unspecified level of cervical spinal cord, subsequent encounter: Principal | ICD-10-CM

## 2023-09-26 DIAGNOSIS — R339 Retention of urine, unspecified: Secondary | ICD-10-CM

## 2023-09-26 MED ORDER — ACETAMINOPHEN 160 MG/5ML PO SOLN
1000.0000 mg | Freq: Three times a day (TID) | ORAL | Status: DC
Start: 2023-09-27 — End: 2023-10-03
  Administered 2023-09-27 – 2023-10-03 (×19): 1000 mg via ORAL
  Filled 2023-09-26 (×20): qty 40.6

## 2023-09-26 MED ORDER — LIDOCAINE 5 % EX PTCH
1.0000 | MEDICATED_PATCH | CUTANEOUS | Status: DC
  Administered 2023-09-26: 1 via TRANSDERMAL
  Filled 2023-09-26: qty 1

## 2023-09-26 MED ORDER — MIDODRINE HCL 5 MG PO TABS
2.5000 mg | ORAL_TABLET | Freq: Three times a day (TID) | ORAL | Status: DC
  Administered 2023-09-26 – 2023-09-28 (×6): 2.5 mg via ORAL
  Filled 2023-09-26 (×7): qty 1

## 2023-09-26 MED ORDER — TROLAMINE SALICYLATE 10 % EX CREA
TOPICAL_CREAM | Freq: Two times a day (BID) | CUTANEOUS | Status: DC | PRN
Start: 2023-09-26 — End: 2023-09-26

## 2023-09-26 MED ORDER — ACETAMINOPHEN 650 MG RE SUPP
650.0000 mg | Freq: Three times a day (TID) | RECTAL | Status: DC
  Filled 2023-09-26 (×4): qty 1

## 2023-09-26 MED ORDER — MUSCLE RUB 10-15 % EX CREA: TOPICAL_CREAM | Freq: Two times a day (BID) | CUTANEOUS | Status: DC | PRN

## 2023-09-26 NOTE — Progress Notes (Signed)
 Occupational Therapy Session Note  Patient Details  Name: Brandon Watts MRN: [id number] Date of Birth: October 17, 1945  Today's Date: 09/26/2023 OT Individual Time: 4098-1191 OT Individual Time Calculation (min): 75 min    Short Term Goals: Week 2:  OT Short Term Goal 1 (Week 2): Pt will perform LB bathing with Mod A (x1) + LRAD. OT Short Term Goal 2 (Week 2): Pt will perform LB dressing with Mod A (x1) + LRAD. OT Short Term Goal 3 (Week 2): Pt will complete toileting tasks with mod A OT Short Term Goal 4 (Week 2): Pt will perform UB dressing tasks with mod A Week 3:     Skilled Therapeutic Interventions/Progress Updates:    1:1 Pt received in the bed and had just woken up. Wife present at bedside. TEDS donned in bed and pt came to EOB with min A. Sitting EOB with only TEDS on BP was 117 /72  Participated in UB bathing at EOB with mod A and donned clean shirt and abdominal binder with max A. Ace wraps donned. Pt able to assist with threading pants with mod A. BP taken again in sitting with abdominal binder, TEDS and Ace wraps on: 110/58. Stood to stand up to pull up pant and pt reports feeling very lightheaded and dizzy.  Tried to take BP but ended up needing to sit before finishing - in siting then reads 116/70. Pt requires min A for standing and mod A for stand pivot to the w/c with cues for trunk posture. In the w/c focus on self feeding with the left hand (pt is left handed) with support against gravity with min A. Pt reports continued sensitivity on dorsal aspect of arm before elbow. Pt also issued tan theraputty with focus on manipulation of putty and picking it up from table top. Pt left sitting up in TIS w/c with wife at bedside.   Therapy Documentation Precautions:  Precautions Precautions: Fall, Cervical Precaution/Restrictions Comments: Foley Required Braces or Orthoses: Cervical Brace Cervical Brace: Hard collar, Soft collar Restrictions Weight Bearing Restrictions Per Provider  Order: No  Pain: Pain Assessment Pain Scale: 0-10 Pain Score: 6  Pain Location: Shoulder Pain Intervention(s): Medication (See eMAR);Hot/Cold interventions also pain with bilateral shoulder sensitivity    Therapy/Group: Individual Therapy  [name] [facility] 09/26/2023, 12:05 PM

## 2023-09-26 NOTE — Progress Notes (Signed)
 Speech Language Pathology Daily Session Note  Patient Details  Name: Brandon Watts MRN: [id number] Date of Birth: 02-Dec-1945  Today's Date: 09/26/2023 SLP Individual Time: 4098-1191 SLP Individual Time Calculation (min): 58 min  Short Term Goals: Week 1: SLP Short Term Goal 1 (Week 1): Pt will increase breath support for production of words at phrase level with 85% intelligibility min A SLP Short Term Goal 2 (Week 1): Pt will increase use of speech intelligibility strategies for phrases with  85% intelligibility min A SLP Short Term Goal 3 (Week 1): Pt will tolerate trials with pureed diet and thin liquids with no overt s/sx of aspiration or penetration on 9/10 trials. SLP Short Term Goal 4 (Week 1): Bedside swallow assessment to determine safest diet. SLP Short Term Goal 5 (Week 1): Pt will complete oral care prior to po intake to ensure good oral hygiene prior to po intake x100%  Skilled Therapeutic Interventions:   Pt seen for skilled SLP session to address dysphagia goals. Pt independently reviewed all pharyngeal strengthening exercises and able to demonstrated each one independently. We reviewed that exercises should be used without food/liquids with the exception of the effortful swallow exercise. Pt completed effortful swallows x30+ with bites of thick yogurt + liquid wash. He continues to report sensation of pharyngeal residue despite multiple swallows + liquid wash. We discussed possibility of hypersensitivity to residue.  No overt or subtle s/s of aspiration noted across trials. No change in vocal quality observed. Pt left sitting upright in chair with family members present. Continue SLP PoC.   Pain Pain Assessment Pain Scale: 0-10 Pain Score: 6  Pain Location: Shoulder Pain Intervention(s): Medication (See eMAR);Hot/Cold interventions  Therapy/Group: Individual Therapy  Ellery Plunk 09/26/2023, 12:38 PM

## 2023-09-26 NOTE — Plan of Care (Signed)
  Problem: RH PAIN MANAGEMENT Goal: RH STG PAIN MANAGED AT OR BELOW PT'S PAIN GOAL Description: < 4 with prns Outcome: Progressing

## 2023-09-26 NOTE — Progress Notes (Signed)
 PROGRESS NOTE   Subjective/Complaints: Patient continues have left shoulder pain and weakness.  OT tried working on self-feeding.  The patient is unfortunately left-handed.  He continues to have hypersensitivity over the left shoulder.  He also has pain bilateral upper trapezius area. His dry mouth is a little bit better, no excess secretions, has been off scopolamine patch since earlier this morning.  Continues to require intermittent catheterizations.  High volumes due to frequent drinking because of dry mouth hopefully this will improve now the patient is off of scopolamine.  Also sleep was good just on Seroquel trazodone was discontinued due to potential anticholinergic effect. Patient asking why he is on losartan if his blood pressure is low. ROS:   Pt denies SOB, abd pain, CP, N/V/C/D, and vision changes   restlessness (+)-better not resolved L>R shoulder pain R shoulder somewhat better Objective:   No results found.   Recent Labs    09/25/23 0527  WBC 7.5  HGB 12.9*  HCT 38.1*  PLT 204   Recent Labs    09/25/23 0527  NA 139  K 4.6  CL 102  CO2 29  GLUCOSE 102*  BUN 24*  CREATININE 0.93  CALCIUM 10.9*    Intake/Output Summary (Last 24 hours) at 09/26/2023 1152 Last data filed at 09/26/2023 0800 Gross per 24 hour  Intake --  Output 2410 ml  Net -2410 ml        Physical Exam: Vital Signs Blood pressure 99/63, pulse (!) 51, temperature (!) 97.5 F (36.4 C), temperature source Oral, resp. rate 18, height 5\' 8"  (1.727 m), weight 72.9 kg, SpO2 100%.        General: awake, alert, appropriate, supine in bed; NAD HENT: conjugate gaze; oropharynx very dry- soft collar in place; cortrak out;  CV: regular rhythm, rate slightly bradycardic; no JVD Pulmonary: CTA B/L; no W/R/R- good air movement GI: soft, NT, ND, (+)BS Psychiatric: appropriate- interactive, pleasant Neurological: able to discuss home  meds, sleep , and labwork  MSK: RUE- Biceps 4-/5; WE 4/5; Triceps 4-/5; Grip 3+ to 4-/5, FA 3+/5 LUE_ Biceps 2+/5; WE 3+/5; Triceps 2+/5; Grip 2+/5; and FA 2/5 less  TTP over R anterior shoulder with now no pain with resisted flexion- trp palpated in R upper trap- same pattern in L shoulder as well  Skin: C/D/I. No apparent lesions.IV intact   MSK:      No apparent deformity. R shoulder AROM in abduction limited to <90 degrees. Non-painful. Left arm is extremely tender to touch and PROM       Neurologic exam:  Cognition: AAO to person, place, time and event?.  Language: Fluent, Mild dysarthria.   Insight: Fair insight into current condition.    Sensation: To light touch hypersensitive in LUE at shoulder  Reflexes :Negative Hoffman's and babinski signs bilaterally.  CN: 2-12 grossly intact.  Coordination: No apparent tremors. Weakness limits FNF on left ; + Mild LLE HTS Spasticity: MAS 0 in all extremities. Flaccid LUE.       Assessment/Plan: 1. Functional deficits which require 3+ hours per day of interdisciplinary therapy in a comprehensive inpatient rehab setting. Physiatrist is providing close team supervision and 24 hour  management of active medical problems listed below. Physiatrist and rehab team continue to assess barriers to discharge/monitor patient progress toward functional and medical goals  Care Tool:  Bathing    Body parts bathed by patient: Chest, Abdomen   Body parts bathed by helper: Buttocks, Right lower leg, Left lower leg, Right arm, Left arm, Front perineal area, Right upper leg, Left upper leg, Face     Bathing assist Assist Level: Maximal Assistance - Patient 24 - 49%     Upper Body Dressing/Undressing Upper body dressing   What is the patient wearing?: Pull over shirt    Upper body assist Assist Level: Maximal Assistance - Patient 25 - 49%    Lower Body Dressing/Undressing Lower body dressing      What is the patient wearing?: Pants      Lower body assist Assist for lower body dressing: Total Assistance - Patient < 25%     Toileting Toileting    Toileting assist Assist for toileting: Total Assistance - Patient < 25%     Transfers Chair/bed transfer  Transfers assist     Chair/bed transfer assist level: Minimal Assistance - Patient > 75%     Locomotion Ambulation   Ambulation assist   Ambulation activity did not occur: Safety/medical concerns (2/2 endurance deficit/ limited activity tolerance)  Assist level: 2 helpers Assistive device: Walker-rolling Max distance: 150   Walk 10 feet activity   Assist  Walk 10 feet activity did not occur: Safety/medical concerns (2/2 endurance deficit/ limited activity tolerance)  Assist level: Minimal Assistance - Patient > 75% Assistive device: Walker-rolling   Walk 50 feet activity   Assist Walk 50 feet with 2 turns activity did not occur: Safety/medical concerns (2/2 endurance deficit/ limited activity tolerance)  Assist level: 2 helpers (w/c follow due to BP - min A)      Walk 150 feet activity   Assist Walk 150 feet activity did not occur: Safety/medical concerns (2/2 endurance deficit/ limited activity tolerance)  Assist level: 2 helpers      Walk 10 feet on uneven surface  activity   Assist Walk 10 feet on uneven surfaces activity did not occur: Safety/medical concerns (2/2 endurance deficit/ limited activity tolerance)         Wheelchair     Assist Is the patient using a wheelchair?: Yes Type of Wheelchair: Manual Wheelchair activity did not occur: Safety/medical concerns (2/2 endurance deficit/ limited activity tolerance)         Wheelchair 50 feet with 2 turns activity    Assist    Wheelchair 50 feet with 2 turns activity did not occur: Safety/medical concerns (2/2 endurance deficit/ limited activity tolerance)       Wheelchair 150 feet activity     Assist  Wheelchair 150 feet activity did not occur:  Safety/medical concerns (2/2 endurance deficit/ limited activity tolerance)       Blood pressure 99/63, pulse (!) 51, temperature (!) 97.5 F (36.4 C), temperature source Oral, resp. rate 18, height 5\' 8"  (1.727 m), weight 72.9 kg, SpO2 100%.   Medical Problem List and Plan: 1. Functional deficits due to SCI (central cord syndrome)- C4 ASIA C/D (incomplete quadriplegia)  with C4 fracture s/p arthrodesis C3-4 anterior interbody technique discectomy for decompression of spinal cord and exiting nerve roots with foraminotomies as well as additional level C4-5 interbody technique for decompression placement of anterior instrumentation consisting of interbody plate and screws C3-4-5 09/09/2023.  Cervical collar at all times             -  patient may not shower             -ELOS/Goals: 18 to 21 days, min assist PT/OT/SLP                            -On admission Dr. Maisie Fus cleared patient for soft collar when in bed for comfort; ortho tech order placed for soft collar and nursing order for soft collar in bed, alternating to hard collar when OOB   - will allow dog to come in from home with papers  - not ready for grounds pass quite yet  Started estim D/c 3/31 Con't CIR PT, OT and SLP 2.  Antithrombotics: -DVT/anticoagulation/segmental LUL PE:  Pharmaceutical: Lovenox initiated 09/11/2023 and titrated.  Neurosurgery Dr. Maisie Fus stating can switch to DOAC 3/7, since last hemoglobin 3/5 will wait until a.m. labs and switch if stable 3/8- pt refused labs this AM- will wait til Monday when have labs 3/12- Switched to Eliquis 5 mg BID from Tx dose lovenox             -antiplatelet therapy: N/A   3. Pain Management: OxyContin sustained release 10 mg every 12 hours, Neurontin 300 mg 3 times daily Robaxin as needed.  Will add tramadol as needed   3/8- pt reports pain usually controlled- con't regimen  3/10--still having a lot of LUE--increase gabapentin to 400mg  tid  3/11- change Tylenol to 1000 mg q8 hours  per pt request  3/12- has been helpful to add tylenol 4. Mood/Behavior/Sleep/Hallucinations: Melatonin 5 mg nightly.  Xanax Dced on admission per family d/t confusion.              - antipsychotic agents: Seroquel 12.5-25 mg PRN              - Delirium precautions with stimulation during the daytime, quiet with low lights an minimal interruptions at nighttime, limit lines/tubes/drains to essential access only, limit physical and chemical restraints               - Melatonin 5 mg at bedtime, trazodone 25-50 mg PRN.  - May benefit from standing at bedtime seroquel if trazodone ineffective.    3/8- slept OK- will con't regimen for now  3/10 continues to struggle with sleep, partially due to pain    -gabapentin increased above   -will schedule trazodone @2100  with back up dose prn       -pt voiced some feeling of depression over his accident/ condition -he is interested in speaking with neuropsychology. -will ask Dr. Kieth Brightly to see patient regarding reactive depression. 3/140- added Seroquel yesterday for psychomotor agitation- will increase to 25 mg at bedtime tonight  5. Neuropsych/cognition/small SDH: This patient is not capable of making decisions on his own behalf.              - telesitter continued --renewed     6. Skin/Wound Care: Routine skin checks 7. Fluids/Electrolytes/Nutrition/hyponatremia: Routine in and outs with follow-up chemistries    Latest Ref Rng & Units 09/25/2023    5:27 AM 09/23/2023    6:14 AM 09/20/2023    6:18 AM  BMP  Glucose 70 - 99 mg/dL 161  096  97   BUN 8 - 23 mg/dL 24  22  17    Creatinine 0.61 - 1.24 mg/dL 0.45  4.09  8.11   Sodium 135 - 145 mmol/L 139  135  134   Potassium 3.5 - 5.1 mmol/L 4.6  4.1  4.2   Chloride 98 - 111 mmol/L 102  100  99   CO2 22 - 32 mmol/L 29  32  30   Calcium 8.9 - 10.3 mg/dL 88.4  16.6  9.9     8.  Dysphagia. Cortrak placed 3/5.   Currently on a mechanical soft diet with MBS pending Advance diet as tolerated per speech  therapy              - On TF with 50 ml/hr osmolite 1.5 + prosource 60 ml daily              - Dietary consulted for management   3/8- will add IVFs, pt insistent- for dehydration- refused labs today so cannot see Cr/BUN to assess- but says not drinking enough  3/10--changed ivf to free water thru tube--encourage PO as well  **update 4:30pm--pt cleared for D2/thins per SLP!   -RD transitioning to nocturnal feeds beginning at 0500 tomorrow  3/11- fed self breakfast this Am and dinner last night for first time  3/12- con't cortrak  3/13- Got OK by Dietician to remove Cortrak and is on D3 thin diet, so no need for steroids- and would probably do better if remove Cortrak- will remove today and monitor  3/14- doing ok without COrtrak 9.  Hypertension with Orthostatic hypotension.  Cozaar 50 mg daily.  Monitor with increased mobility.              - Holding home hydrochlorothiazide    3/8- BP 110s- a little soft- monitor for Orthostatic hypotension  3/11- will decrease Cozaar to 25 mg daily and add Midodrine 2.5 mg TID with meals- to start at 6:30am since almost passed out 2x per PT.   3/12- BP dropped to 58 systolic yesterday-added Midodrine- BP is slightly elevated in supine, but dropping dramatically in spite of TEDs and ACE wraps   3/13- Increased Midodrine to 5 mg TID- will con't Abd binder, TEDs and ACE wraps.   3/14- BP did better with OT today lowest low 100s systolic, with abd binder, TED's, ACE wraps and midodrine Vitals:   09/25/23 2000 09/26/23 0606  BP: 112/69 99/63  Pulse: 66 (!) 51  Resp: 18 18  Temp: 98.3 F (36.8 C) (!) 97.5 F (36.4 C)  SpO2: 96% 100%  Patient still on losartan will discontinue. Have also reduced midodrine dose. Off scopolamine which can also lower blood pressure 10.  BPH with likely neurogenic bladder.  Proscar 5 mg nightly.  Check PVR             - Has coude catheter placed 2/27, patient hesitant to remove, OK to keep tonight and would re-address in AM -  states he needs to be able to stand to urinate d/t BPH   3/8- explained can wait 2 days, but then have to remove foley-   3/10---remove foley--voiding trial  3/12- Requiring caths- no real voiding so far - will add Urecholine, because OH makes it difficult to add Flomax. Add 5 mg TID for now- monitor  3/13- will wait on Flomax- until BP doing better- bladder scans and caths up to 1000cc- needs to do every 4 hours based on cath results-d/w nursing 3/14- wrote order for coude' caths only and lidocaine jelly with caths  Midodrine is an alpha-blocker which also may be affecting bladder outflow, will start weaning today.  Scopolamine has anticholinergic effects which can also reduce bladder emptying and has been discontinued.  Certainly his urinary issues may be related  to his spinal cord injury but will try to minimize pharmaceutical effects of his meds 11.  Constipation with likely neurogenic bowel.   No BM since admission               - Miralax 17 g BID, sennakot S 2 tabs BID              - PRN sorbitol for moderate constipation, PRN fleet enema for severe constipation   3/8- will give 45 cc Sorbitol and if no results, Soap suds enema this evening  3/10- bm's continent yesterday--type 6--no changes to regimen today  3/11- Strained but not able to go this AM- will montior  3/12- LBM yesterday per pt- but 3/9 per chart- will check with therapy- if no results, will need more intervention- already on Miralax BID, Senna 2 tabs BID  3/13- LBM yesterday  3/14- LBM 2 days ago 12.  Acute respiratory failure with hypoxia/bilateral pneumonia.  Continue cefepime x 7 days.  Pulmonary services have signed off.              - Ongoing significant secretions with frequent suctioning              - Robitussin 5 mg Q6H             - Biotene PRN   3/8- running Cefepime now- con't 7 days total  3/9- CXR looks good- only mild L sided atelectasis - added Scopolamine due to increased secretions for a few days  only  3/12- changed Duonebs back to QID from prn  3/13- improved secretions with Scopolamine - this may be contributing to retention no issues with secretions consider d/c  13. L shoulder weakness and pain and new R shoulder pain.  No fracture on xray 2/26.              - Likely due to L C4 myelopathy; however no accompanying sensory deficit and no other weakness. I suspect a radicular component also  - Monitor with therapies; would consider MRI shoulder for rotator cuff pathology if no improvement   3/13- Pt's main pain is anterior and upper trap on L side- will con't to monitor- doesn't want steroid injections at this time- R shoulder pain started overnight from pulling in bed- appears to be biceps tendinitis- wants to wait on steroids due to concern with hallucinations  3/14- ended up doing steroid injection with 20mg  of kenalog- pain doing better  Trap pain with substitution for delt weakness, add sportscream 14. Restlessness/psychomotor agitation  3/13- will try Seroquel 12.5 mg at bedtime- d/w TBI MD and she agreed it's better than Depakote.   3/14- increasing seroquel to 25 mg QHS  Sleeping ok, now on seroquel , d/c trazodone, anticholinergic effect   15.  Dizziness likely multifactorial, his BP is at baseline and per OT had drop of <78mmHg, he had small SDH and TBI contributing to symptoms    LOS: 9 days A FACE TO FACE EVALUATION WAS PERFORMED  Erick Colace 09/26/2023, 11:52 AM

## 2023-09-26 NOTE — Progress Notes (Signed)
 Physical Therapy Session Note  Patient Details  Name: Brandon Watts MRN: [id number] Date of Birth: [birth year]  Today's Date: 09/26/2023 PT Individual Time: 1345-1440 PT Individual Time Calculation (min): 55 min   Short Term Goals: Week 1:  PT Short Term Goal 1 (Week 1): Pt will complete bed mobility with minA PT Short Term Goal 2 (Week 1): Pt will complete sit to stand with modA + LRAD PT Short Term Goal 3 (Week 1): Pt will complete SPT with modA + LRAD PT Short Term Goal 4 (Week 1): Pt will amb 85ft with modA + LRAD PT Short Term Goal 5 (Week 1): Pt will initiate stair training  Skilled Therapeutic Interventions/Progress Updates:      Therapy Documentation Precautions:  Precautions Precautions: Fall, Cervical Precaution/Restrictions Comments: Foley Required Braces or Orthoses: Cervical Brace Cervical Brace: Hard collar, Soft collar Restrictions Weight Bearing Restrictions Per Provider Order: No  Pt agreeable to PT session with emphasis on gait/AFO training and NMR with electrical stimulation for wrist extensors per patient request. Pt with improved wrist and hand strength and ROM compared to prior session.   Pt BP WFL and recorded as 124/66 and 143/70 following gait training with RW. Pt presents with R ankle inversion and foot drop with gait x 150' hospital room>main gym. PT educated pt on AFO and pt ambulated 120' (medial strut) and 150' (lateral strut) PLS. Pt presents with improved gait mechanics with lateral PLS and decreased foot drop, pt reports improved balance.   Pt performed estim at intensity 16 for NMES small muscle atrophy x 5 min while engaging in against gravity active wrist extension body weight x 10  and with 2# dumb bell x 5.   Pt left seated at bedside with all needs in reach and spouse present.    Therapy/Group: Individual Therapy  Truitt Leep Truitt Leep PT, DPT  09/26/2023, 3:48 PM

## 2023-09-26 NOTE — Progress Notes (Signed)
 Per previous report, difficult I &O coude cath and high volumes. Paged [place], orders to place coude foley. No resistance when placed, blood noted at meatus before cath. . Patient tolerated without complaint. Alfredo Martinez A

## 2023-09-27 LAB — CBC
HCT: 37.3 % — ABNORMAL LOW (ref 39.0–52.0)
Hemoglobin: 12.8 g/dL — ABNORMAL LOW (ref 13.0–17.0)
MCH: 32.7 pg (ref 26.0–34.0)
MCHC: 34.3 g/dL (ref 30.0–36.0)
MCV: 95.2 fL (ref 80.0–100.0)
Platelets: 227 K/uL (ref 150–400)
RBC: 3.92 MIL/uL — ABNORMAL LOW (ref 4.22–5.81)
RDW: 11.7 % (ref 11.5–15.5)
WBC: 9 K/uL (ref 4.0–10.5)
nRBC: 0 % (ref 0.0–0.2)

## 2023-09-27 LAB — MAGNESIUM: Magnesium: 2.3 mg/dL (ref 1.7–2.4)

## 2023-09-27 LAB — COMPREHENSIVE METABOLIC PANEL
ALT: 34 U/L (ref 0–44)
AST: 18 U/L (ref 15–41)
Albumin: 3 g/dL — ABNORMAL LOW (ref 3.5–5.0)
Alkaline Phosphatase: 59 U/L (ref 38–126)
Anion gap: 4 — ABNORMAL LOW (ref 5–15)
BUN: 28 mg/dL — ABNORMAL HIGH (ref 8–23)
CO2: 28 mmol/L (ref 22–32)
Calcium: 10.5 mg/dL — ABNORMAL HIGH (ref 8.9–10.3)
Chloride: 104 mmol/L (ref 98–111)
Creatinine, Ser: 0.95 mg/dL (ref 0.61–1.24)
GFR, Estimated: >60 mL/min (ref >60)
Glucose, Bld: 107 mg/dL — ABNORMAL HIGH (ref 70–99)
Potassium: 4.3 mmol/L (ref 3.5–5.1)
Sodium: 136 mmol/L (ref 135–145)
Total Bilirubin: 0.8 mg/dL (ref 0.0–1.2)
Total Protein: 6 g/dL — ABNORMAL LOW (ref 6.5–8.1)

## 2023-09-27 MED ORDER — CHLORHEXIDINE GLUCONATE CLOTH 2 % EX PADS
6.0000 | MEDICATED_PAD | Freq: Two times a day (BID) | CUTANEOUS | Status: DC
  Administered 2023-09-27 – 2023-09-30 (×6): 6 via TOPICAL

## 2023-09-27 MED ORDER — LIDOCAINE 5 % EX PTCH
2.0000 | MEDICATED_PATCH | CUTANEOUS | Status: DC
  Administered 2023-09-28: 2 via TRANSDERMAL
  Filled 2023-09-27 (×6): qty 2

## 2023-09-27 MED ORDER — ALPRAZOLAM 0.25 MG PO TABS
0.1250 mg | ORAL_TABLET | Freq: Once | ORAL | Status: AC
  Administered 2023-09-27: 0.125 mg via ORAL
  Filled 2023-09-27: qty 1

## 2023-09-27 NOTE — Progress Notes (Signed)
 Physical Therapy Session Note  Patient Details  Name: Brandon Watts MRN: [id number] Date of Birth: [birth year]  Today's Date: 09/27/2023 PT Individual Time: 1100-1200 PT Individual Time Calculation (min): 60 min   Short Term Goals: Week 1:  PT Short Term Goal 1 (Week 1): Pt will complete bed mobility with minA PT Short Term Goal 2 (Week 1): Pt will complete sit to stand with modA + LRAD PT Short Term Goal 3 (Week 1): Pt will complete SPT with modA + LRAD PT Short Term Goal 4 (Week 1): Pt will amb 29ft with modA + LRAD PT Short Term Goal 5 (Week 1): Pt will initiate stair training  Skilled Therapeutic Interventions/Progress Updates:      Therapy Documentation Precautions:  Precautions Precautions: Fall, Cervical Precaution/Restrictions Comments: Foley Required Braces or Orthoses: Cervical Brace Cervical Brace: Hard collar, Soft collar Restrictions Weight Bearing Restrictions Per Provider Order: No  Pt agreeable to PT session with emphasis on NMR with L UE NMES with SAEBO with following paramaters on intensity of 5 for wrist extensors and 3 for  biceps.    Saebo Stim One 330 pulse width 35 Hz pulse rate On 8 sec/ off 8 sec Ramp up/ down 2 sec Symmetrical Biphasic wave form  Max intensity at 500 Ohm load  Pt performed following exercises with electrical stimulation unit:   -wrist extension 2 x 5 (gravity dependent and neutral)   -supination+ pronation with dowel 2 x 5   -serratus punches with elbow extension forward x 5 and contralateral x 5   -active assist shoulder flexion x 8   -active assist front raise x 8   PT provided stabilization to prevent excessive compensatory strategies and also educated pt on sensory desensitizing with wash cloth and pillowcase to address sensory deficits.   Pt left seated at bedside with all needs in reach and spouse present. Pt without reports of pain and lethargic in session.    Therapy/Group: Individual Therapy  Truitt Leep Truitt Leep PT, DPT  09/27/2023, 12:45 PM

## 2023-09-27 NOTE — Progress Notes (Signed)
 Speech Language Pathology Daily Session Note  Patient Details  Name: Brandon Watts MRN: [id number] Date of Birth: Feb 03, 1946  Today's Date: 09/27/2023 SLP Individual Time: 1430-1445 SLP Individual Time Calculation (min): 15 min  Short Term Goals: Week 2: SLP Short Term Goal 1 (Week 2): Pt will complete oropharyngeal exercises w/ s visual/verbal cues SLP Short Term Goal 2 (Week 2): Pt will utilize safe swallow strategies during PO intake with s cues SLP Short Term Goal 3 (Week 2): Pt will present w/ overt s/s of airway invasion 75% of the time or less during PO intake  Skilled Therapeutic Interventions: SLP conducted skilled therapy session targeting dysphagia management goals. Upon SLP entry, patient reported that he would need a few minutes as NT team entering to assist patient with toileting. Patient missed 35 minutes due to toileting/nursing care.  SLP conducted trials of regular/thin liquids as patient continues to tolerate Dys3 diet. Patient tolerated regular solid textures without difficulty and no overt s/sx of penetration/aspiration were observed across all trials of solids and liquids. Due to time constraints, oropharyngeal exercises not completed, although patient does endorse completion x3 already this date. Patient was left in lowered bed with call bell in reach and bed alarm set. SLP will continue to target goals per plan of care.       Pain Pain Assessment Pain Scale: 0-10 Pain Score: 4  Pain Type: Acute pain  Therapy/Group: Individual Therapy  Jeannie Done, M.A., CCC-SLP  Yetta Barre 09/27/2023, 3:14 PM
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
## 2023-09-27 NOTE — Progress Notes (Signed)
 Occupational Therapy Session Note  Patient Details  Name: Brandon Watts MRN: [id number] Date of Birth: July 08, 1946  Today's Date: 09/27/2023 OT Individual Time: 4098-1191 OT Individual Time Calculation (min): 70 min    Short Term Goals: Week 1:  OT Short Term Goal 1 (Week 1): Pt will perform toilet transfer with Mod A (x1) + LRAD. OT Short Term Goal 1 - Progress (Week 1): Met OT Short Term Goal 2 (Week 1): Pt will perform LB bathing with Mod A (x1) + LRAD. OT Short Term Goal 2 - Progress (Week 1): Progressing toward goal OT Short Term Goal 3 (Week 1): Pt will perform LB dressing with Mod A (x1) + LRAD. OT Short Term Goal 3 - Progress (Week 1): Progressing toward goal  Skilled Therapeutic Interventions/Progress Updates:    Pt resting in bed upon arrival with wife present. Pt already dressing with Ted hose, Ace wraps applied to BLE and abdominal binder in place. Pt ready for therapy. Initial assessment of BP: Supine with HOB elevated-119/70 HR 61 Seated EOB-106/66 HR 64 Standing 74/60 HR 78 (pt symptomatic)  Pt returned to EOB before stand pivot transfer to w/c.   Focus on LUE reaching/grasp seated in w/c and seated EOM. Block practice stacking/unstacking 4 cups x 6 with reast breaks. Min verbal cues to not hold breathing. Block practice raising LUE to mouth 5x5. Pt with increased active movement and grasp strength. Pt returned to room and reamined in w/c with all needs within reach. Wife present.   Therapy Documentation Precautions:  Precautions Precautions: Fall, Cervical Precaution/Restrictions Comments: Foley Required Braces or Orthoses: Cervical Brace Cervical Brace: Hard collar, Soft collar Restrictions Weight Bearing Restrictions Per Provider Order: No   Pain:  Pt reports LUE/hand numbness and feeling cold (no tingling); emotional support   Therapy/Group: Individual Therapy  [name] 09/27/2023, 10:38 AM

## 2023-09-27 NOTE — Progress Notes (Signed)
   09/27/23 2102  [place] Suicide Severity Rating Scale  1. In the past month - "Have you wished you were dead or wished you could go to sleep and not wake up?" Yes  2. In the past month - "Have you actually had any thoughts of killing yourself?" No  3. In the past month - "Have you been thinking about how you might kill yourself?" No  4. In the past month - "Have you had these thoughts and had some intention of acting on them?" No  5. In the past month - "Have you started to work out or worked out the details of how to kill yourself? Do you intend to carry out this plan?" No  6. Have you ever done anything, started to do anything, or prepared to do anything to end your life?" No  7. "Was this within the past three months?" No  C-SSRS RISK CATEGORY Error: Q3, 4, or 5 should not be populated when Q2 is No   Pt denies any intent or plan to carry out suicide.

## 2023-09-27 NOTE — Progress Notes (Signed)
 Speech Language Pathology Weekly Progress and Session Note  Patient Details  Name: Brandon Watts MRN: [id number] Date of Birth: 1945-09-05  Beginning of progress report period: September 18, 2023 End of progress report period: September 27, 2023  Short Term Goals: Week 1: SLP Short Term Goal 1 (Week 1): Pt will increase breath support for production of words at phrase level with 85% intelligibility min A SLP Short Term Goal 1 - Progress (Week 1): Met SLP Short Term Goal 2 (Week 1): Pt will increase use of speech intelligibility strategies for phrases with  85% intelligibility min A SLP Short Term Goal 2 - Progress (Week 1): Met SLP Short Term Goal 3 (Week 1): Pt will tolerate trials with pureed diet and thin liquids with no overt s/sx of aspiration or penetration on 9/10 trials. SLP Short Term Goal 3 - Progress (Week 1): Discontinued (comment) (frequent clear/cough d/t pharyngeal edema and residue) SLP Short Term Goal 4 (Week 1): Bedside swallow assessment to determine safest diet. SLP Short Term Goal 4 - Progress (Week 1): Met SLP Short Term Goal 5 (Week 1): Pt will complete oral care prior to po intake to ensure good oral hygiene prior to po intake x100% SLP Short Term Goal 5 - Progress (Week 1): Met    New Short Term Goals: Week 2: SLP Short Term Goal 1 (Week 2): Pt will complete oropharyngeal exercises w/ s visual/verbal cues SLP Short Term Goal 2 (Week 2): Pt will utilize safe swallow strategies during PO intake with s cues SLP Short Term Goal 3 (Week 2): Pt will present w/ overt s/s of airway invasion 75% of the time or less during PO intake  Weekly Progress Updates: Pt has made excellent progress this week as demonstrated by improved pharyngeal clearance and diet tolerance. He is currently tolerating a Dys 3 diet and thin liquids w/ only s-minA cues to maintain safe swallow strategies. Anticipate pharyngeal clearance will continue to improve as pre vertebral swelling s/p ACDF resolves.  Pt/family education ongoing at this time. Dysarthria goals d/c d/t adequate speech production. Recommend cont ST to target ongoing pharyngeal dysphagia.    Intensity: Minumum of 1-2 x/day, 30 to 90 minutes Frequency: 1 to 3 out of 7 days Duration/Length of Stay: 19- 21 days Treatment/Interventions: Dysphagia/aspiration precaution training;Patient/family education;Therapeutic Activities;Functional tasks;Cueing hierarchy   [name] 09/27/2023, 8:02 AM
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
## 2023-09-28 MED ORDER — MIDODRINE HCL 5 MG PO TABS
5.0000 mg | ORAL_TABLET | Freq: Three times a day (TID) | ORAL | Status: DC
  Administered 2023-09-28 – 2023-09-30 (×6): 5 mg via ORAL
  Filled 2023-09-28 (×6): qty 1

## 2023-09-28 MED ORDER — MIDODRINE HCL 5 MG PO TABS
2.5000 mg | ORAL_TABLET | Freq: Once | ORAL | Status: AC
  Administered 2023-09-28: 2.5 mg via ORAL
  Filled 2023-09-28: qty 1

## 2023-09-28 NOTE — Progress Notes (Signed)
 Occupational Therapy Session Note  Patient Details  Name: Brandon Watts MRN: [id number] Date of Birth: 1946-06-16  Today's Date: 09/28/2023 OT Individual Time: 3474-2595 OT Individual Time Calculation (min): 60 min    Short Term Goals: Week 2:  OT Short Term Goal 1 (Week 2): Pt will perform LB bathing with Mod A (x1) + LRAD. OT Short Term Goal 2 (Week 2): Pt will perform LB dressing with Mod A (x1) + LRAD. OT Short Term Goal 3 (Week 2): Pt will complete toileting tasks with mod A OT Short Term Goal 4 (Week 2): Pt will perform UB dressing tasks with mod A  Skilled Therapeutic Interventions/Progress Updates:    OTA intervention with initial focus on shaving with HOB elevated. Pt required assistance 2/2 no mirror available. HOH assistance to use LUE. Pt able to grasp razor loosely with Lt hand. Supine>sit with min A. Sitting balance with CGA. Sit>stand and stand pivot transfer with CGA. LUE therex with focus on elbow flexion (biceps isolation) to reach chin 5x8. See below for BP  BP: HOB elevated 147/78 HR 55 Standing 78/46 HR 70 Sitting in w/c 133/79 HR 52  Ted hose, ace wraps, and abd binder donned at all times. Pt symptomatic with standing.  Pt remained in w/c with all needs within reach. Wife present.   Therapy Documentation Precautions:  Precautions Precautions: Fall, Cervical Precaution/Restrictions Comments: Foley Required Braces or Orthoses: Cervical Brace Cervical Brace: Hard collar, Soft collar Restrictions Weight Bearing Restrictions Per Provider Order: No   Pain:  Pt with ongoing LUE hypersensitivity   Therapy/Group: Individual Therapy  [name] 09/28/2023, 9:27 AM

## 2023-09-28 NOTE — Progress Notes (Signed)
 Speech Language Pathology Daily Session Note  Patient Details  Name: Brandon Watts MRN: [id number] Date of Birth: 04-09-1946  Today's Date: 09/28/2023 SLP Individual Time: 1330-1400 SLP Individual Time Calculation (min): 30 min  Short Term Goals: Week 2: SLP Short Term Goal 1 (Week 2): Pt will complete oropharyngeal exercises w/ s visual/verbal cues SLP Short Term Goal 2 (Week 2): Pt will utilize safe swallow strategies during PO intake with s cues SLP Short Term Goal 3 (Week 2): Pt will present w/ overt s/s of airway invasion 75% of the time or less during PO intake  Skilled Therapeutic Interventions:   Pt and his wife were greeted at bedside. He was awake/alert and very participative throughout tx tasks targeting dysphagia. During initial conversation, he reported increased ease when swallowing medications whole. Encouraged him to continue to do this as able vs crushing meds. He completed masako x15, open mouth swallow x10, and mendelsohn maneuver x10. He benefited from s cues to maintain adequate technique throughout. Single sips of thin liquids via straw were utilized between reps to assist with swallow initiation. Overt s/s of airway invasion noted 20% of the time or less. At the end of tx tasks, he was left in bed with the alarm set and call light within reach. Recommend cont ST per POC.   Pain Pain Assessment Pain Scale: 0-10 Pain Score: 3  Pain Type: Acute pain Pain Location: Shoulder Pain Orientation: Left Pain Descriptors / Indicators: Discomfort Pain Frequency: Intermittent Pain Onset: On-going Pain Intervention(s): Repositioned  Therapy/Group: Individual Therapy  [name] 09/28/2023, 2:14 PM

## 2023-09-28 NOTE — Progress Notes (Signed)
 Physical Therapy Weekly Progress Note  Patient Details  Name: Brandon Watts MRN: [id number] Date of Birth: 06/29/1946  Beginning of progress report period: September 18, 2023 End of progress report period: September 28, 2023  Today's Date: 09/28/2023 PT Individual Time: 0918-1002, 1403 - 1500 PT Individual Time Calculation (min): 44 min, 57 min   Patient has met 5 of 5 short term goals.  Pt has improved significantly in all mobility due to improved blood pressure management and upright tolerance, however it continues to be a limiting factor and pt benefits from frequent rest breaks. Pt is able to ambulate up to 150' with RW and CGA-minA and has initiated stair training. Pt is limited by decreased LUE function and hyperalgesia in BUE.   Patient continues to demonstrate the following deficits muscle weakness and muscle joint tightness, orthostatic hypotension, abnormal tone and decreased coordination, and decreased postural control, decreased balance strategies, and difficulty maintaining precautions and therefore will continue to benefit from skilled PT intervention to increase functional independence with mobility.  Patient progressing toward long term goals..  Continue plan of care.  PT Short Term Goals Week 1:  PT Short Term Goal 1 (Week 1): Pt will complete bed mobility with minA PT Short Term Goal 1 - Progress (Week 1): Met PT Short Term Goal 2 (Week 1): Pt will complete sit to stand with modA + LRAD PT Short Term Goal 2 - Progress (Week 1): Met PT Short Term Goal 3 (Week 1): Pt will complete SPT with modA + LRAD PT Short Term Goal 3 - Progress (Week 1): Met PT Short Term Goal 4 (Week 1): Pt will amb 71ft with modA + LRAD PT Short Term Goal 4 - Progress (Week 1): Met PT Short Term Goal 5 (Week 1): Pt will initiate stair training PT Short Term Goal 5 - Progress (Week 1): Met Week 2:  PT Short Term Goal 1 (Week 2): Pt will perform stand pivot transfers with SBA + LRAD PT Short Term Goal 2  (Week 2): Pt will ambulate over uneven ground with CGA and LRAD PT Short Term Goal 3 (Week 2): Pt will initiate car transfer training PT Short Term Goal 4 (Week 2): Pt will ambulate 4 steps with BHR per home set up with minA  Skilled Therapeutic Interventions/Progress Updates:     Session 1: Pt received seated in w/c and agreeable to therapy. Pt notes 3-4/10 pain in B shoulders, states he has already received pain medication this AM and SPT provided rest breaks for pain management.   Pt dependently transported to therapy gym for step ups to initiate stair training. Initial seated BP: 115/76 (88) mmHg, 59 bpm. Pt performed x8 step toe taps to 4" step with CGA and RW, pt notes it was easy.   Step ups to 4" step: CGA with RW with seated rest break between bouts leaned back and doing LE bicycles to increase HR d/t lightheadedness - x5 step ups each leg, 145/79 (98)mmHg - x6 step ups each leg with 20# weighted vest, RPE 3/10 - 2x8 step ups each leg with 30# weighted vest, RPE 6/10 with 2 reps in reserve, 127bpm and 128/75 (91) mmHg  Discussed appropriate intensity  levels and rep ranges for strengthening.   Pt ambulated back to room (115') with CGA and RW and no AFO, pt took sharp 90* turns and stated it was to help keep his balance, verbally cued to try more gradual turns.   Pt moved to supine with SBA and  boosted in bed with SBA. SPT dependently doffed hard collar and donned soft collar. Pt left supine in bed with alarm on and all needs in reach.   Session 2: Pt received supine in bed and agreeable to therapy, pt notes need for bowel movement. Pt has no c/o of increased pain from baseline, provided rest breaks and positioning changes throughout session.   PT dependently doffed soft collar and donned hard cervical collar. Pt moves from supine<>sit EOB with SBA and HOB elevated. SPT dependently dons abdominal binder. Pt performs ambulatory transfer to toilet with RW and CGA, SPT dependently  doffed pants and briefs but pt experienced urge incontinence of bowel.   Pt performs several sit<>stands with CGA and RW and maintains prolonged standing position in mini-squat with RW for dependent peri-care and foley care, no c/o lightheadedness but pt did note that knees felt "shaky" by end of standing bout. Pt maintains trunk unsupported sitting balance while SPT dependently doffs ace/wrap and thigh-high TED hose and dons new TED and ace/wrap, pt uses B hands to help smooth wrinkles to improve fine motor skills. Pt has brief bout of lightheadedness relieved by leaning back.   Pt performs ambulatory transfer to w/c with RW and CGA for seated rest break before performing 377' gait training with RW and CGA, verbal cues for more gradual turns and increased R stance time. Pt demonstrated decreased R stance and L step length with fatigue. Pt left seated in TIS with all needs in reach.   Therapy Documentation Precautions:  Precautions Precautions: Fall, Cervical Precaution/Restrictions Comments: Foley Required Braces or Orthoses: Cervical Brace Cervical Brace: Hard collar, Soft collar Restrictions Weight Bearing Restrictions Per Provider Order: No General:      Therapy/Group: Individual Therapy  [name] 09/28/2023, 11:54 AM

## 2023-09-28 NOTE — Progress Notes (Signed)
 PROGRESS NOTE   Subjective/Complaints:  Pt still having allodynia and hyperalgesia of L shoulder and LUE in general- he reports they are working on desensitization techniques with therapy Explained if increase Nerve pain meds, have increased chance of not being able to void.  He's willing to keep current doses of meds for now.    BP dropped to 70/50's - yesterday and also dropped this Am with therapy. Also had lightheadedness when it occurred.   Admits he's very sad and wishes won't wake up.   Shoulders B/L still the source of his main pain.     ROS:   Pt denies SOB, abd pain, CP, N/V/C/D, and vision changes    restlessness (+)-better not resolved L>R shoulder pain R shoulder somewhat better but still has hypalgesia Objective:   No results found.   Recent Labs    09/27/23 0540  WBC 9.0  HGB 12.8*  HCT 37.3*  PLT 227   Recent Labs    09/27/23 0540  NA 136  K 4.3  CL 104  CO2 28  GLUCOSE 107*  BUN 28*  CREATININE 0.95  CALCIUM 10.5*    Intake/Output Summary (Last 24 hours) at 09/28/2023 0955 Last data filed at 09/28/2023 0800 Gross per 24 hour  Intake 1040 ml  Output 2550 ml  Net -1510 ml        Physical Exam: Vital Signs Blood pressure (!) 140/79, pulse (!) 53, temperature 97.8 F (36.6 C), resp. rate 18, height 5\' 8"  (1.727 m), weight 58.4 kg, SpO2 100%.        General: awake, alert, appropriate,  Sitting up in bed; wife at bedside as well as nursing; wearing abd Binder and TED's/ACE wraps; NAD HENT: conjugate gaze; oropharynx moist CV: regular rhythm, bradycardic  rate; no JVD Pulmonary: CTA B/L; no W/R/R- good air movement GI: soft, NT, ND, (+)BS Psychiatric: appropriate Neurological: alert, but tangential - said felt more delayed today- is noticeable   MSK: Very TTP with hyperalgesia over L deltoid and shoulder laterally RUE- Biceps 4-/5; WE 4/5; Triceps 4-/5; Grip 3+ to 4-/5, FA  3+/5 LUE_ Biceps 2+/5; WE 3+/5; Triceps 2+/5; Grip 2+/5; and FA 2/5 less  TTP over R anterior shoulder with now no pain with resisted flexion- trp palpated in R upper trap- same pattern in L shoulder as well  Skin: C/D/I. No apparent lesions.IV intact   MSK:      No apparent deformity. R shoulder AROM in abduction limited to <90 degrees. Non-painful. Left arm is extremely tender to touch and PROM       Neurologic exam:  Cognition: AAO to person, place, time and event?.  Language: Fluent, Mild dysarthria.   Insight: Fair insight into current condition.    Sensation: To light touch hypersensitive in LUE at shoulder  Reflexes :Negative Hoffman's and babinski signs bilaterally.  CN: 2-12 grossly intact.  Coordination: No apparent tremors. Weakness limits FNF on left ; + Mild LLE HTS Spasticity: MAS 0 in all extremities. Flaccid LUE.       Assessment/Plan: 1. Functional deficits which require 3+ hours per day of interdisciplinary therapy in a comprehensive inpatient rehab setting. Physiatrist is providing close team supervision  and 24 hour management of active medical problems listed below. Physiatrist and rehab team continue to assess barriers to discharge/monitor patient progress toward functional and medical goals  Care Tool:  Bathing    Body parts bathed by patient: Chest, Abdomen   Body parts bathed by helper: Buttocks, Right lower leg, Left lower leg, Right arm, Left arm, Front perineal area, Right upper leg, Left upper leg, Face     Bathing assist Assist Level: Maximal Assistance - Patient 24 - 49%     Upper Body Dressing/Undressing Upper body dressing   What is the patient wearing?: Pull over shirt    Upper body assist Assist Level: Maximal Assistance - Patient 25 - 49%    Lower Body Dressing/Undressing Lower body dressing      What is the patient wearing?: Pants     Lower body assist Assist for lower body dressing: Total Assistance - Patient < 25%      Toileting Toileting    Toileting assist Assist for toileting: Total Assistance - Patient < 25%     Transfers Chair/bed transfer  Transfers assist     Chair/bed transfer assist level: Minimal Assistance - Patient > 75%     Locomotion Ambulation   Ambulation assist   Ambulation activity did not occur: Safety/medical concerns (2/2 endurance deficit/ limited activity tolerance)  Assist level: 2 helpers Assistive device: Walker-rolling Max distance: 150   Walk 10 feet activity   Assist  Walk 10 feet activity did not occur: Safety/medical concerns (2/2 endurance deficit/ limited activity tolerance)  Assist level: Minimal Assistance - Patient > 75% Assistive device: Walker-rolling   Walk 50 feet activity   Assist Walk 50 feet with 2 turns activity did not occur: Safety/medical concerns (2/2 endurance deficit/ limited activity tolerance)  Assist level: 2 helpers (w/c follow due to BP - min A)      Walk 150 feet activity   Assist Walk 150 feet activity did not occur: Safety/medical concerns (2/2 endurance deficit/ limited activity tolerance)  Assist level: 2 helpers      Walk 10 feet on uneven surface  activity   Assist Walk 10 feet on uneven surfaces activity did not occur: Safety/medical concerns (2/2 endurance deficit/ limited activity tolerance)         Wheelchair     Assist Is the patient using a wheelchair?: Yes Type of Wheelchair: Manual Wheelchair activity did not occur: Safety/medical concerns (2/2 endurance deficit/ limited activity tolerance)         Wheelchair 50 feet with 2 turns activity    Assist    Wheelchair 50 feet with 2 turns activity did not occur: Safety/medical concerns (2/2 endurance deficit/ limited activity tolerance)       Wheelchair 150 feet activity     Assist  Wheelchair 150 feet activity did not occur: Safety/medical concerns (2/2 endurance deficit/ limited activity tolerance)       Blood  pressure (!) 140/79, pulse (!) 53, temperature 97.8 F (36.6 C), resp. rate 18, height 5\' 8"  (1.727 m), weight 58.4 kg, SpO2 100%.   Medical Problem List and Plan: 1. Functional deficits due to SCI (central cord syndrome)- C4 ASIA C/D (incomplete quadriplegia)  with C4 fracture s/p arthrodesis C3-4 anterior interbody technique discectomy for decompression of spinal cord and exiting nerve roots with foraminotomies as well as additional level C4-5 interbody technique for decompression placement of anterior instrumentation consisting of interbody plate and screws C3-4-5 09/09/2023.  Cervical collar at all times             -  patient may not shower             -ELOS/Goals: 18 to 21 days, min assist PT/OT/SLP                            -On admission Dr. Maisie Fus cleared patient for soft collar when in bed for comfort; ortho tech order placed for soft collar and nursing order for soft collar in bed, alternating to hard collar when OOB   - will allow dog to come in from home with papers  - not ready for grounds pass quite yet  Started estim D/c 3/31 Con't CIR PT, OT and SLP 2.  Antithrombotics: -DVT/anticoagulation/segmental LUL PE:  Pharmaceutical: Lovenox initiated 09/11/2023 and titrated.  Neurosurgery Dr. Maisie Fus stating can switch to DOAC 3/7, since last hemoglobin 3/5 will wait until a.m. labs and switch if stable 3/8- pt refused labs this AM- will wait til Monday when have labs 3/12- Switched to Eliquis 5 mg BID from Tx dose lovenox             -antiplatelet therapy: N/A   3. Pain Management: OxyContin sustained release 10 mg every 12 hours, Neurontin 300 mg 3 times daily Robaxin as needed.  Will add tramadol as needed   3/8- pt reports pain usually controlled- con't regimen  3/10--still having a lot of LUE--increase gabapentin to 400mg  tid  3/11- change Tylenol to 1000 mg q8 hours per pt request  3/12- has been helpful to add tylenol  3/17- has tramadol- not really using much- last used  3/14  3/18- still has hyperalgesia- of LUE but we discussed using other nerve pain meds- but wait since they will cause urinary retention- doing desensitization techniques 4. Mood/Behavior/Sleep/Hallucinations: Melatonin 5 mg nightly.  Xanax Dced on admission per family d/t confusion.              - antipsychotic agents: Seroquel 12.5-25 mg PRN              - Delirium precautions with stimulation during the daytime, quiet with low lights an minimal interruptions at nighttime, limit lines/tubes/drains to essential access only, limit physical and chemical restraints               - Melatonin 5 mg at bedtime, trazodone 25-50 mg PRN.  - May benefit from standing at bedtime seroquel if trazodone ineffective.    3/8- slept OK- will con't regimen for now  3/10 continues to struggle with sleep, partially due to pain    -gabapentin increased above   -will schedule trazodone @2100  with back up dose prn       -pt voiced some feeling of depression over his accident/ condition -he is interested in speaking with neuropsychology. -will ask Dr. Kieth Brightly to see patient regarding reactive depression. 3/140- added Seroquel yesterday for psychomotor agitation- will increase to 25 mg at bedtime tonight  3/17- sleeping great- feels more confused/delayed this AM- don't see a medicine that could cause this 3/18- off Trazodone due to urinary issues- admits very sad- but doesn't want to add meds! 5. Neuropsych/cognition/small SDH: This patient is not capable of making decisions on his own behalf.              - telesitter continued --renewed daily     6. Skin/Wound Care: Routine skin checks 7. Fluids/Electrolytes/Nutrition/hyponatremia: Routine in and outs with follow-up chemistries    Latest Ref Rng & Units 09/27/2023    5:40 AM 09/25/2023  5:27 AM 09/23/2023    6:14 AM  BMP  Glucose 70 - 99 mg/dL 409  811  914   BUN 8 - 23 mg/dL 28  24  22    Creatinine 0.61 - 1.24 mg/dL 7.82  9.56  2.13   Sodium 135 - 145  mmol/L 136  139  135   Potassium 3.5 - 5.1 mmol/L 4.3  4.6  4.1   Chloride 98 - 111 mmol/L 104  102  100   CO2 22 - 32 mmol/L 28  29  32   Calcium 8.9 - 10.3 mg/dL 08.6  57.8  46.9     8.  Dysphagia. Cortrak placed 3/5.   Currently on a mechanical soft diet with MBS pending Advance diet as tolerated per speech therapy              - On TF with 50 ml/hr osmolite 1.5 + prosource 60 ml daily              - Dietary consulted for management   3/8- will add IVFs, pt insistent- for dehydration- refused labs today so cannot see Cr/BUN to assess- but says not drinking enough  3/10--changed ivf to free water thru tube--encourage PO as well  **update 4:30pm--pt cleared for D2/thins per SLP!   -RD transitioning to nocturnal feeds beginning at 0500 tomorrow  3/11- fed self breakfast this Am and dinner last night for first time  3/12- con't cortrak  3/13- Got OK by Dietician to remove Cortrak and is on D3 thin diet, so no need for steroids- and would probably do better if remove Cortrak- will remove today and monitor  3/14- doing ok without Cortrak  3/18- Is dry- BUN 28- will have pt push fluids 9.  Hypertension with Orthostatic hypotension.  Cozaar 50 mg daily.  Monitor with increased mobility.              - Holding home hydrochlorothiazide    3/8- BP 110s- a little soft- monitor for Orthostatic hypotension  3/11- will decrease Cozaar to 25 mg daily and add Midodrine 2.5 mg TID with meals- to start at 6:30am since almost passed out 2x per PT.   3/12- BP dropped to 58 systolic yesterday-added Midodrine- BP is slightly elevated in supine, but dropping dramatically in spite of TEDs and ACE wraps   3/13- Increased Midodrine to 5 mg TID- will con't Abd binder, TEDs and ACE wraps.   3/14- BP did better with OT today lowest low 100s systolic, with abd binder, TED's, ACE wraps and midodrine Vitals:   09/27/23 1935 09/28/23 0424  BP: 111/73 (!) 140/79  Pulse: 67 (!) 53  Resp: 17 18  Temp: 97.8 F (36.6 C)  97.8 F (36.6 C)  SpO2: 97% 100%  Patient still on losartan will discontinue. Have also reduced midodrine dose. Off scopolamine which can also lower blood pressure  3/17- BP still soft supine in bed- have ordered Orthostatics by nursing- and will see if need to increase Midodrine again, due to soft BP with laying down  3/18- will increase Midodrine to 5 mg TID since BP dropped to 70's yesterday and almost passed out.  10.  BPH with likely neurogenic bladder.  Proscar 5 mg nightly.  Check PVR             - Has coude catheter placed 2/27, patient hesitant to remove, OK to keep tonight and would re-address in AM - states he needs to be able to stand to urinate  d/t BPH   3/8- explained can wait 2 days, but then have to remove foley-   3/10---remove foley--voiding trial  3/12- Requiring caths- no real voiding so far - will add Urecholine, because OH makes it difficult to add Flomax. Add 5 mg TID for now- monitor  3/13- will wait on Flomax- until BP doing better- bladder scans and caths up to 1000cc- needs to do every 4 hours based on cath results-d/w nursing 3/14- wrote order for coude' caths only and lidocaine jelly with caths  Midodrine is an alpha-blocker which also may be affecting bladder outflow, will start weaning today.  Scopolamine has anticholinergic effects which can also reduce bladder emptying and has been discontinued.  Certainly his urinary issues may be related to his spinal cord injury but will try to minimize pharmaceutical effects of his meds 3/17- they placed Foley this weekend due to trauma- will keep 3-4 days then d/c.   3/18- will d/c foley Thursday AM since placed Sunday night-for trauma- pt said "happy for break from cathing"  11.  Constipation with likely neurogenic bowel.   No BM since admission               - Miralax 17 g BID, sennakot S 2 tabs BID              - PRN sorbitol for moderate constipation, PRN fleet enema for severe constipation   3/8- will give 45 cc  Sorbitol and if no results, Soap suds enema this evening  3/10- bm's continent yesterday--type 6--no changes to regimen today  3/11- Strained but not able to go this AM- will montior  3/12- LBM yesterday per pt- but 3/9 per chart- will check with therapy- if no results, will need more intervention- already on Miralax BID, Senna 2 tabs BID  3/13- LBM yesterday  3/14- LBM 2 days ago  3/18- LBM 2 days ago 12.  Acute respiratory failure with hypoxia/bilateral pneumonia.  Continue cefepime x 7 days.  Pulmonary services have signed off.              - Ongoing significant secretions with frequent suctioning              - Robitussin 5 mg Q6H             - Biotene PRN   3/8- running Cefepime now- con't 7 days total  3/9- CXR looks good- only mild L sided atelectasis - added Scopolamine due to increased secretions for a few days only  3/12- changed Duonebs back to QID from prn  3/13- improved secretions with Scopolamine - this may be contributing to retention no issues with secretions consider d/c  3/17- Scopolamine stopped  13. L shoulder weakness and pain and new R shoulder pain.  No fracture on xray 2/26.              - Likely due to L C4 myelopathy; however no accompanying sensory deficit and no other weakness. I suspect a radicular component also  - Monitor with therapies; would consider MRI shoulder for rotator cuff pathology if no improvement   3/13- Pt's main pain is anterior and upper trap on L side- will con't to monitor- doesn't want steroid injections at this time- R shoulder pain started overnight from pulling in bed- appears to be biceps tendinitis- wants to wait on steroids due to concern with hallucinations  3/14- ended up doing steroid injection with 20mg  of kenalog- pain doing better  Trap pain with substitution  for delt weakness, add sportscream 14. Restlessness/psychomotor agitation  3/13- will try Seroquel 12.5 mg at bedtime- d/w TBI MD and she agreed it's better than Depakote.    3/14- increasing seroquel to 25 mg QHS  Sleeping ok, now on seroquel , d/c trazodone, anticholinergic effect   15.  Dizziness likely multifactorial, his BP is at baseline and per OT had drop of <69mmHg, he had small SDH and TBI contributing to symptoms   I spent a total of  51  minutes on total care today- >50% coordination of care- due to  D/w pt, wife, nursing and OT about low BP in spite of Abd binder, TED's and ACE wraps and 2.5 mg of midodrine- will also take Foley out Thursday AM per pt request since had trauma Sunday night.  Wait on Urecholine increase since can also lower BP.  Also team conference today to f/u on progress  LOS: 11 days A FACE TO FACE EVALUATION WAS PERFORMED  Ammara Raj 09/28/2023, 9:55 AM

## 2023-09-28 NOTE — Patient Care Conference (Signed)
 Inpatient RehabilitationTeam Conference and Plan of Care Update Date: 09/28/2023   Time: 1111 am    Patient Name: Brandon Watts      Medical Record Number: [id number]  Date of Birth: [birth year] Sex: Male         Room/Bed: 4W17C/4W17C-01 Payor Info: Payor: [employer]vertising copy[employer] MEDICARE / Plan: [employer] MEDICARE / Product Type: *No Product type* /    Admit Date/Time:  09/17/2023  3:22 PM  Primary Diagnosis:  Quadriplegia, C1-C4, incomplete [facility])  Hospital Problems: Principal Problem:   Quadriplegia, C1-C4, incomplete (HCC) Active Problems:   C4 cervical fracture (HCC)   Central cord syndrome (HCC)   Dysphagia   Respiratory failure with hypoxia (HCC)   SDH (subdural hematoma) [facility])    Expected Discharge Date: Expected Discharge Date: 10/11/23  Team Members Present: Physician leading conference: Dr. Genice Rouge Social Worker Present: Cecile Sheerer, LCSWA Nurse Present: Konrad Dolores, RN PT Present: Bernie Covey, PT OT Present: Ardis Rowan, COTA;Jennifer Katrinka Blazing, OT SLP Present: Pablo Lawrence, SLP PPS Coordinator present : Fae Pippin, SLP     Current Status/Progress Goal Weekly Team Focus  Bowel/Bladder   Patient is contienent of bowel, last BM 3/16. Patient has foley intact and is draining   Patient continent. and pass voiding trail   Have foley removed    Swallow/Nutrition/ Hydration   upgraded to Dys3/thin liquids   modI  continued tolerance, oropharyngeal swallowing exercises, upgrade to regular/thin liquids    ADL's   bathing-mod A; UB dressing-mod A; LB dressing-tot A; ongoing OH, improved LUE function/strength   min a overall   educaiton, BADLs, transfers, LUE NMR    Mobility   min a transfers, up to 150 ft gait training with RW and AFO, intermittent OH but overall improved   supervision overall  gait training, UE NMR, BP management    Communication                Safety/Cognition/ Behavioral Observations  WFL overall. Slight hoarse vocal quality  continues. Anticipate pain medication and sleep deprivation continue to negatively impact cognition. When he is fully awake/alert no deficits are noted.            Pain   Patient has neck and back pain that is controlled by scheduled medications.   Patient pain controlled.   Provide patient with alternative therapies and pain medications to control pain level.    Skin   Patient has incison site.   Keep incision site CDI to promote healing.  Assess skin Qshift.      Discharge Planning:  Pt will d/c to home with his wife. Pt would like to be able as independent as possible within reason. SW will confirm there are no barriers to discharge.   Team Discussion: Patient admitted with SCI (central cord syndrome)- C4 ASIA C/D (incomplete quadriplegia) with C4 fracture; Status post arthrodesis C3-4 anterior interbody technique discectomy for decompression of spinal cord and exiting nerve roots with foraminotomies Additional level C4-5 interbody technique for decompression placement of anterior instrumentation consisting of interbody plate and screws C3-4-5 09/09/2023.  Patient has a subdural hematoma ,dysphagia, left shoulder weakness.  Patient limited by pain,  hypotension, depression and insomnia medications adjusted by MD.    Patient on target to meet rehab goals: yes, Patient requires mod A with  UB dressing. Patient requires total assistance with LB dressing. Patient requires Mod assistance with bathing. Patient requires min a transfers up to 150 ft gait training using RW and AFO. Overall goals are set  for  min assist -supervision at discharge.  *See Care Plan and progress notes for long and short-term goals.   Revisions to Treatment Plan:  Telesitter for safety Dietary consult Neuropsych consult Family Conference  on March 20,2025 at 0945 am  Teaching Needs: Safety, medications, toileting, transfers, etc.   Current Barriers to Discharge: Decreased caregiver support and Neurogenic  bowel and bladder  Possible Resolutions to Barriers: Family Conference  Family Education     Medical Summary Current Status: neck incision- soft collar/hard collar;  TBI and SCI- using LUE more- hyperalgesia and allondynia in LUE; L shoulder pain; FOley due ot trauma over weekend- severe orthostatic hypotension-  Barriers to Discharge: Medical stability;Neurogenic Bowel & Bladder;Incontinence;Self-care education;Weight bearing restrictions;Uncontrolled Pain;Hypotension  Barriers to Discharge Comments: OH; FOley, to be removed Thursday and then urinary retention; UB weakness- hyperalgesia- tearful; Possible Resolutions to [employer] Focus: increased midodrine; D3 diet- changed TRazodone for sleep- don't want ot start Duloxetine- due to urinary retention- d/c- 3/31   Continued Need for Acute Rehabilitation Level of Care: The patient requires daily medical management by a physician with specialized training in physical medicine and rehabilitation for the following reasons: Direction of a multidisciplinary physical rehabilitation program to maximize functional independence : Yes Medical management of patient stability for increased activity during participation in an intensive rehabilitation regime.: Yes Analysis of laboratory values and/or radiology reports with any subsequent need for medication adjustment and/or medical intervention. : Yes   I attest that I was present, lead the team conference, and concur with the assessment and plan of the team.   Gwenyth Allegra 09/28/2023, 8:33 PM

## 2023-09-28 NOTE — Progress Notes (Signed)
 Patient ID: Brandon Watts, male   DOB: 1946/06/10, 78 y.o.   MRN: [id number]  SW met with pt and pt husband in room to provide updates from team conference, and discharge date.   Cecile Sheerer, MSW, LCSW Office: [phone] Cell: [phone] Fax: [phone]

## 2023-09-29 NOTE — Progress Notes (Signed)
 Recreational Therapy Session Note  Patient Details  Name: SYD NEWSOME MRN: [id number] Date of Birth: 12/17/1945 Today's Date: 09/29/2023  Pain: no c/o Skilled Therapeutic Interventions/Progress Updates: Session focused on activity tolerance, community ambulation using RW, relaxation/coping strategies.  Pt excited to go outside this afternoon to enjoy the warm weather.  Outside, pt ambulated multiple rounds of ~200" with RW and CGA.  Pt demonstrated good safety awareness and was self monitoring for fatigue and dizziness, directing his care.  During seated rest breaks, discussed importance and role of leisure.  Pt shared his interests in music, singing, playing the cello, his airedale terrier, and love of the outdoors.  Discussed intermittent feelings of anxiety & potential strategies to reduce it including use of his leisure interests.  Pt stated understanding, acknowledging use of some of them this hospitalization.   Therapy/Group: Co-Treatment   Aldin Drees 09/29/2023, 4:02 PM

## 2023-09-29 NOTE — Progress Notes (Signed)
 Occupational Therapy Session Note  Patient Details  Name: Brandon Watts MRN: [id number] Date of Birth: [birth year]  Today's Date: 09/29/2023 OT Individual Time: 6387-5643 OT Individual Time Calculation (min): 75 min    Short Term Goals: Week 2:  OT Short Term Goal 1 (Week 2): Pt will perform LB bathing with Mod A (x1) + LRAD. OT Short Term Goal 2 (Week 2): Pt will perform LB dressing with Mod A (x1) + LRAD. OT Short Term Goal 3 (Week 2): Pt will complete toileting tasks with mod A OT Short Term Goal 4 (Week 2): Pt will perform UB dressing tasks with mod A  Skilled Therapeutic Interventions/Progress Updates:    Pt resting in bed upon arrival with wife present. LPN also present for med admin. Pt reports he had an "episode" while sitting on BSC with NT staff and became extremely lightheaded. Pt reported he was feeling better upon my arrival. OT intervention with focus on bed mobility, sitting balance, standing balance, functional tarnsfers, and LUE function to increase independence with BADLs. Pt became emotional/tearful during later part of session regarding current life situation. Emotional support provided. Supine>sit EOB with supervision. Sitting balance with supervision. Standing balance with CGA. BP reading below. Stand pivot transfer using RW with CGA. BUE functional activities with focus on elbow flexion reaching to chin and shoulder flexion reaching to place clothes pins on dowel. Pt required unweighting of LUE for reaching tasks. Pt returned to room and remained in w/c with all needs within reach.  BP: HOB elevated 141/65 HR 58 Sitting EOB 131/68 HR 62 Standing 77/46 HR 71 (symptomatic) Seated in w/c 122/74 HR 61   Therapy Documentation Precautions:  Precautions Precautions: Fall, Cervical Precaution/Restrictions Comments: Foley Required Braces or Orthoses: Cervical Brace Cervical Brace: Hard collar, Soft collar Restrictions Weight Bearing Restrictions Per Provider Order:  No   Pain:  Pt reports bil scapulae discomfort; soft tissue massage offered  Therapy/Group: Individual Therapy  [name] 09/29/2023, 12:15 PM

## 2023-09-29 NOTE — Progress Notes (Signed)
 Physical Therapy Session Note  Patient Details  Name: Brandon Watts MRN: [id number] Date of Birth: June 21, 1946  Today's Date: 09/29/2023 PT Individual Time: [phone] and 1421-1530 PT Individual Time Calculation (min): 31 min and 69 min  Short Term Goals: Week 2:  PT Short Term Goal 1 (Week 2): Pt will perform stand pivot transfers with SBA + LRAD PT Short Term Goal 2 (Week 2): Pt will ambulate over uneven ground with CGA and LRAD PT Short Term Goal 3 (Week 2): Pt will initiate car transfer training PT Short Term Goal 4 (Week 2): Pt will ambulate 4 steps with BHR per home set up with minA  Skilled Therapeutic Interventions/Progress Updates: Pt presented in TIS agreeable to therapy. Pt c/o unrated pain in B shoulders, no intervention requested at this time. Due to short session focused on general conditioning via NuStep with varying levels of intensity. Pt completed stand step transfer to NuStep without AD and CGA. Performed in intervals with 2 min rest using x 4 extremities. Pt performed at L3/5/7/5/3 with pt maintaining ~60-80 SPM depending on resistance level. Pt with no increase in B shoulder pain with activity. After L7 noted max HR 62. Pt completed stand step transfer to return to TIS in same manner as prior. Pt transported back to room and remained in TIS at end of session with call bell within reach and needs met.   Tx2: Pt presented in bed with wife and friends present agreeable to therapy. Pt states unrated pain in B shoulders, rest and repositioning provided as needed during session. Pt agreeable to work on ambulation outside for integration into community. Misty Stanley, RT present throughout session. Pt completed supine to sit with HOB elevated with supervision. NT arrived for vitals check performed while PTA empted foley bag, BP 139/62. PTA donned shoes/AFO total A. Completed stand step transfer without AD CGA to TIS. Pt transported to [facility] entrance, pt appreciative for a few minutes in  fresh air and sunshine while Misty Stanley conversed with pt. Pt then participated in several bouts of ambulation distances 150-258ft with RW and CGA consistently. Pt noted to have good safety with uneven surfaces including cracks in sidewalk and sloped walkways. During seated rest breaks pt encouraged to sit on benches for lower surface challenge. Pt able to stand from benches with CGA consistently. Pt also ambulated long descending slope to bench for seated rest before ambulating back up long slope through [facility] entrance. Pt with x1 occurrence when entering building of RW catching on floor mat however pt was able to correct without LOB. Pt stating minor episodes of dizziness ranging between 1-3 on 1-10 scale. Pt transported back to unit for energy conservation and pt participated in stair training ascending x 8 6in steps using B rails. Pt ascended with step through pattern and descended with step to pattern. Pt transported back to room and remained in TIS at end of session with current needs met and wife present.      Therapy Documentation Precautions:  Precautions Precautions: Fall, Cervical Precaution/Restrictions Comments: Foley Required Braces or Orthoses: Cervical Brace Cervical Brace: Hard collar, Soft collar Restrictions Weight Bearing Restrictions Per Provider Order: No General:    Therapy/Group: Individual Therapy  Chatara Lucente 09/29/2023, 12:37 PM

## 2023-09-29 NOTE — Progress Notes (Signed)
 PROGRESS NOTE   Subjective/Complaints:  Pt reports needs to have BM. - Did actually have BM today.  York Spaniel is "sadder" but admits it's "not all day"- doesn't want to start SSRI at this time.  Shoulders hurting more this AM and has more parasthesias and tingling this AM.   We discussed doing MRI of L shoulder- but pt doesn't want to- hates the MRI machine.  ROS:   Pt denies SOB, abd pain, CP, N/V/C/D, and vision changes   restlessness (+)-better not resolved L>R shoulder pain R shoulder somewhat better but still has hypalgesia and L shoulder pain Objective:   No results found.   Recent Labs    09/27/23 0540  WBC 9.0  HGB 12.8*  HCT 37.3*  PLT 227   Recent Labs    09/27/23 0540  NA 136  K 4.3  CL 104  CO2 28  GLUCOSE 107*  BUN 28*  CREATININE 0.95  CALCIUM 10.5*    Intake/Output Summary (Last 24 hours) at 09/29/2023 1851 Last data filed at 09/29/2023 1830 Gross per 24 hour  Intake 960 ml  Output 2750 ml  Net -1790 ml        Physical Exam: Vital Signs Blood pressure 139/62, pulse 61, temperature 98 F (36.7 C), temperature source Oral, resp. rate 16, height 5\' 8"  (1.727 m), weight 58.4 kg, SpO2 98%.         General: awake, alert, appropriate, Initially in bed; but then on toilet- transferred to toilet via [place] with NT; wife at bedside;  NAD HENT: conjugate gaze; oropharynx moist CV: regular rate and rhythm; no JVD Pulmonary: CTA B/L; no W/R/R- good air movement GI: soft, NT, ND, (+)BS Psychiatric: appropriate Neurological: alert-    MSK: Very TTP with hyperalgesia over L deltoid and shoulder laterally- NO change today RUE- Biceps 4-/5; WE 4/5; Triceps 4-/5; Grip 3+ to 4-/5, FA 3+/5 LUE_ Biceps 2+/5; WE 3+/5; Triceps 2+/5; Grip 2+/5; and FA 2/5 less  TTP over R anterior shoulder with now no pain with resisted flexion- trp palpated in R upper trap- same pattern in L shoulder as well  Skin:  C/D/I. No apparent lesions.IV intact   MSK:      No apparent deformity. R shoulder AROM in abduction limited to <90 degrees. Non-painful. Left arm is extremely tender to touch and PROM       Neurologic exam:  Cognition: AAO to person, place, time and event?.  Language: Fluent, Mild dysarthria.   Insight: Fair insight into current condition.    Sensation: To light touch hypersensitive in LUE at shoulder  Reflexes :Negative Hoffman's and babinski signs bilaterally.  CN: 2-12 grossly intact.  Coordination: No apparent tremors. Weakness limits FNF on left ; + Mild LLE HTS Spasticity: MAS 0 in all extremities. Flaccid LUE.       Assessment/Plan: 1. Functional deficits which require 3+ hours per day of interdisciplinary therapy in a comprehensive inpatient rehab setting. Physiatrist is providing close team supervision and 24 hour management of active medical problems listed below. Physiatrist and rehab team continue to assess barriers to discharge/monitor patient progress toward functional and medical goals  Care Tool:  Bathing    Body  parts bathed by patient: Chest, Abdomen   Body parts bathed by helper: Buttocks, Right lower leg, Left lower leg, Right arm, Left arm, Front perineal area, Right upper leg, Left upper leg, Face     Bathing assist Assist Level: Maximal Assistance - Patient 24 - 49%     Upper Body Dressing/Undressing Upper body dressing   What is the patient wearing?: Pull over shirt    Upper body assist Assist Level: Maximal Assistance - Patient 25 - 49%    Lower Body Dressing/Undressing Lower body dressing      What is the patient wearing?: Pants     Lower body assist Assist for lower body dressing: Total Assistance - Patient < 25%     Toileting Toileting    Toileting assist Assist for toileting: Total Assistance - Patient < 25%     Transfers Chair/bed transfer  Transfers assist     Chair/bed transfer assist level: Contact Guard/Touching  assist     Locomotion Ambulation   Ambulation assist   Ambulation activity did not occur: Safety/medical concerns (2/2 endurance deficit/ limited activity tolerance)  Assist level: Contact Guard/Touching assist Assistive device: Walker-rolling Max distance: 150   Walk 10 feet activity   Assist  Walk 10 feet activity did not occur: Safety/medical concerns (2/2 endurance deficit/ limited activity tolerance)  Assist level: Contact Guard/Touching assist Assistive device: Walker-rolling   Walk 50 feet activity   Assist Walk 50 feet with 2 turns activity did not occur: Safety/medical concerns (2/2 endurance deficit/ limited activity tolerance)  Assist level: Minimal Assistance - Patient > 75% Assistive device: Walker-rolling    Walk 150 feet activity   Assist Walk 150 feet activity did not occur: Safety/medical concerns (2/2 endurance deficit/ limited activity tolerance)  Assist level: Contact Guard/Touching assist Assistive device: Walker-rolling    Walk 10 feet on uneven surface  activity   Assist Walk 10 feet on uneven surfaces activity did not occur: Safety/medical concerns (d/t limited activity tolerance and imbalance)         Wheelchair     Assist Is the patient using a wheelchair?: Yes Type of Wheelchair: Manual Wheelchair activity did not occur: Safety/medical concerns (2/2 endurance deficit/ limited activity tolerance)  Wheelchair assist level: Dependent - Patient 0%      Wheelchair 50 feet with 2 turns activity    Assist    Wheelchair 50 feet with 2 turns activity did not occur: Safety/medical concerns (2/2 endurance deficit/ limited activity tolerance)       Wheelchair 150 feet activity     Assist  Wheelchair 150 feet activity did not occur: Safety/medical concerns (2/2 endurance deficit/ limited activity tolerance)       Blood pressure 139/62, pulse 61, temperature 98 F (36.7 C), temperature source Oral, resp. rate 16,  height 5\' 8"  (1.727 m), weight 58.4 kg, SpO2 98%.   Medical Problem List and Plan: 1. Functional deficits due to SCI (central cord syndrome)- C4 ASIA C/D (incomplete quadriplegia)  with C4 fracture s/p arthrodesis C3-4 anterior interbody technique discectomy for decompression of spinal cord and exiting nerve roots with foraminotomies as well as additional level C4-5 interbody technique for decompression placement of anterior instrumentation consisting of interbody plate and screws C3-4-5 09/09/2023.  Cervical collar at all times             -patient may not shower             -ELOS/Goals: 18 to 21 days, min assist PT/OT/SLP                            -  On admission Dr. Maisie Fus cleared patient for soft collar when in bed for comfort; ortho tech order placed for soft collar and nursing order for soft collar in bed, alternating to hard collar when OOB   - will allow dog to come in from home with papers  - not ready for grounds pass quite yet  Started estim D/c 3/31 Ccon't CIR PT, OT and SLP 2.  Antithrombotics: -DVT/anticoagulation/segmental LUL PE:  Pharmaceutical: Lovenox initiated 09/11/2023 and titrated.  Neurosurgery Dr. Maisie Fus stating can switch to DOAC 3/7, since last hemoglobin 3/5 will wait until a.m. labs and switch if stable 3/8- pt refused labs this AM- will wait til Monday when have labs 3/12- Switched to Eliquis 5 mg BID from Tx dose lovenox             -antiplatelet therapy: N/A   3. Pain Management: OxyContin sustained release 10 mg every 12 hours, Neurontin 300 mg 3 times daily Robaxin as needed.  Will add tramadol as needed   3/8- pt reports pain usually controlled- con't regimen  3/10--still having a lot of LUE--increase gabapentin to 400mg  tid  3/11- change Tylenol to 1000 mg q8 hours per pt request  3/12- has been helpful to add tylenol  3/17- has tramadol- not really using much- last used 3/14  3/18- 3/19 still has hyperalgesia- of LUE but we discussed using other nerve pain  meds- but wait since they will cause urinary retention- doing desensitization techniques  -also refused MRI of L shoulder since "hates MRI machines"  4. Mood/Behavior/Sleep/Hallucinations: Melatonin 5 mg nightly.  Xanax Dced on admission per family d/t confusion.              - antipsychotic agents: Seroquel 12.5-25 mg PRN              - Delirium precautions with stimulation during the daytime, quiet with low lights an minimal interruptions at nighttime, limit lines/tubes/drains to essential access only, limit physical and chemical restraints               - Melatonin 5 mg at bedtime, trazodone 25-50 mg PRN.  - May benefit from standing at bedtime seroquel if trazodone ineffective.    3/8- slept OK- will con't regimen for now  3/10 continues to struggle with sleep, partially due to pain    -gabapentin increased above   -will schedule trazodone @2100  with back up dose prn       -pt voiced some feeling of depression over his accident/ condition -he is interested in speaking with neuropsychology. -will ask Dr. Kieth Brightly to see patient regarding reactive depression. 3/140- added Seroquel yesterday for psychomotor agitation- will increase to 25 mg at bedtime tonight  3/17- sleeping great- feels more confused/delayed this AM- don't see a medicine that could cause this 3/18- off Trazodone due to urinary issues- admits very sad- but doesn't want to add meds! 3/19- Pt refuses SSRI for mood 5. Neuropsych/cognition/small SDH: This patient is not completely capable of making decisions on his own behalf.              - telesitter continued --renewed daily     6. Skin/Wound Care: Routine skin checks 7. Fluids/Electrolytes/Nutrition/hyponatremia: Routine in and outs with follow-up chemistries    Latest Ref Rng & Units 09/27/2023    5:40 AM 09/25/2023    5:27 AM 09/23/2023    6:14 AM  BMP  Glucose 70 - 99 mg/dL 161  096  045   BUN 8 - 23 mg/dL  28  24  22    Creatinine 0.61 - 1.24 mg/dL 6.21  3.08  6.57    Sodium 135 - 145 mmol/L 136  139  135   Potassium 3.5 - 5.1 mmol/L 4.3  4.6  4.1   Chloride 98 - 111 mmol/L 104  102  100   CO2 22 - 32 mmol/L 28  29  32   Calcium 8.9 - 10.3 mg/dL 84.6  96.2  95.2     8.  Dysphagia. Cortrak placed 3/5.   Currently on a mechanical soft diet with MBS pending Advance diet as tolerated per speech therapy              - On TF with 50 ml/hr osmolite 1.5 + prosource 60 ml daily              - Dietary consulted for management   3/8- will add IVFs, pt insistent- for dehydration- refused labs today so cannot see Cr/BUN to assess- but says not drinking enough  3/10--changed ivf to free water thru tube--encourage PO as well  **update 4:30pm--pt cleared for D2/thins per SLP!   -RD transitioning to nocturnal feeds beginning at 0500 tomorrow  3/11- fed self breakfast this Am and dinner last night for first time  3/12- con't cortrak  3/13- Got OK by Dietician to remove Cortrak and is on D3 thin diet, so no need for steroids- and would probably do better if remove Cortrak- will remove today and monitor  3/14- doing ok without Cortrak  3/18- Is dry- BUN 28- will have pt push fluids 9.  Hypertension with Orthostatic hypotension.  Cozaar 50 mg daily.  Monitor with increased mobility.              - Holding home hydrochlorothiazide    3/8- BP 110s- a little soft- monitor for Orthostatic hypotension  3/11- will decrease Cozaar to 25 mg daily and add Midodrine 2.5 mg TID with meals- to start at 6:30am since almost passed out 2x per PT.   3/12- BP dropped to 58 systolic yesterday-added Midodrine- BP is slightly elevated in supine, but dropping dramatically in spite of TEDs and ACE wraps   3/13- Increased Midodrine to 5 mg TID- will con't Abd binder, TEDs and ACE wraps.   3/14- BP did better with OT today lowest low 100s systolic, with abd binder, TED's, ACE wraps and midodrine Vitals:   09/29/23 0408 09/29/23 1429  BP: (!) 148/90 139/62  Pulse: (!) 55 61  Resp: 17 16   Temp: 97.9 F (36.6 C) 98 F (36.7 C)  SpO2: 98% 98%  Patient still on losartan will discontinue. Have also reduced midodrine dose. Off scopolamine which can also lower blood pressure  3/17- BP still soft supine in bed- have ordered Orthostatics by nursing- and will see if need to increase Midodrine again, due to soft BP with laying down  3/18- will increase Midodrine to 5 mg TID since BP dropped to 70's yesterday and almost passed out. 3/19- BP lying down 140s yesterday but better this AM-   10.  BPH with likely neurogenic bladder.  Proscar 5 mg nightly.  Check PVR             - Has coude catheter placed 2/27, patient hesitant to remove, OK to keep tonight and would re-address in AM - states he needs to be able to stand to urinate d/t BPH   3/8- explained can wait 2 days, but then have to remove foley-  3/10---remove foley--voiding trial  3/12- Requiring caths- no real voiding so far - will add Urecholine, because OH makes it difficult to add Flomax. Add 5 mg TID for now- monitor  3/13- will wait on Flomax- until BP doing better- bladder scans and caths up to 1000cc- needs to do every 4 hours based on cath results-d/w nursing 3/14- wrote order for coude' caths only and lidocaine jelly with caths  Midodrine is an alpha-blocker which also may be affecting bladder outflow, will start weaning today.  Scopolamine has anticholinergic effects which can also reduce bladder emptying and has been discontinued.  Certainly his urinary issues may be related to his spinal cord injury but will try to minimize pharmaceutical effects of his meds 3/17- they placed Foley this weekend due to trauma- will keep 3-4 days then d/c.   3/18- will d/c foley Thursday AM since placed Sunday night-for trauma- pt said "happy for break from cathing"  3/19- will remove Foley in AM/Thursday since don't want pt getting UTI 11.  Constipation with likely neurogenic bowel.   No BM since admission               - Miralax 17 g  BID, sennakot S 2 tabs BID              - PRN sorbitol for moderate constipation, PRN fleet enema for severe constipation   3/8- will give 45 cc Sorbitol and if no results, Soap suds enema this evening  3/10- bm's continent yesterday--type 6--no changes to regimen today  3/11- Strained but not able to go this AM- will montior  3/12- LBM yesterday per pt- but 3/9 per chart- will check with therapy- if no results, will need more intervention- already on Miralax BID, Senna 2 tabs BID  3/13- LBM yesterday  3/14- LBM 2 days ago  3/18- LBM 2 days ago  3/19- LBM  this AM 12.  Acute respiratory failure with hypoxia/bilateral pneumonia.  Continue cefepime x 7 days.  Pulmonary services have signed off.              - Ongoing significant secretions with frequent suctioning              - Robitussin 5 mg Q6H             - Biotene PRN   3/8- running Cefepime now- con't 7 days total  3/9- CXR looks good- only mild L sided atelectasis - added Scopolamine due to increased secretions for a few days only  3/12- changed Duonebs back to QID from prn  3/13- improved secretions with Scopolamine - this may be contributing to retention no issues with secretions consider d/c  3/17- Scopolamine stopped  13. L shoulder weakness and pain and new R shoulder pain.  No fracture on xray 2/26.              - Likely due to L C4 myelopathy; however no accompanying sensory deficit and no other weakness. I suspect a radicular component also  - Monitor with therapies; would consider MRI shoulder for rotator cuff pathology if no improvement   3/13- Pt's main pain is anterior and upper trap on L side- will con't to monitor- doesn't want steroid injections at this time- R shoulder pain started overnight from pulling in bed- appears to be biceps tendinitis- wants to wait on steroids due to concern with hallucinations  3/14- ended up doing steroid injection with 20mg  of kenalog- pain doing better  3/19- pt refused L  shoulder  MRI  Trap pain with substitution for delt weakness, add sportscream 14. Restlessness/psychomotor agitation  3/13- will try Seroquel 12.5 mg at bedtime- d/w TBI MD and she agreed it's better than Depakote.   3/14- increasing seroquel to 25 mg QHS  Sleeping ok, now on seroquel , d/c trazodone, anticholinergic effect   15.  Dizziness likely multifactorial, his BP is at baseline and per OT had drop of <24mmHg, he had small SDH and TBI contributing to symptoms    I spent a total of 44    minutes on total care today- >50% coordination of care- due to  D/w pt x2- went to room twice- about MRI, SSRI and foley- as well as family conference tomorrow at 9:45 LOS: 12 days A FACE TO FACE EVALUATION WAS PERFORMED  Selda Jalbert 09/29/2023, 6:51 PM

## 2023-09-29 NOTE — Plan of Care (Signed)
  Problem: RH Expression Communication Goal: LTG Patient will increase speech intelligibility (SLP) Description: LTG: Patient will increase speech intelligibility at word/phrase/conversation level with cues, 90% of the time (SLP) Outcome: Completed/Met   Problem: RH Problem Solving Goal: LTG Patient will demonstrate problem solving for (SLP) Description: LTG:  Patient will demonstrate problem solving for basic/complex daily situations with cues  (SLP) Flowsheets (Taken 09/29/2023 1650) LTG: Patient will demonstrate problem solving for (SLP): Complex daily situations LTG Patient will demonstrate problem solving for: Modified Independent   Problem: RH Pre-functional/Other (Specify) Goal: RH LTG SLP (Specify) 1 Description: RH LTG SLP (Specify) 1 Flowsheets (Taken 09/29/2023 1650) LTG: Other SLP (Specify) 1: Pt will process complex information w/ 100% accuracy @ modI   Problem: RH Memory Goal: LTG Patient will demonstrate ability for day to day (SLP) Description: LTG:   Patient will demonstrate ability for day to day recall/carryover during cognitive/linguistic activities with assist  (SLP) Flowsheets (Taken 09/29/2023 1652) LTG: Patient will demonstrate ability for day to day recall:  Daily complex information  New information LTG: Patient will demonstrate ability for day to day recall/carryover during cognitive/linguistic activities with assist (SLP): Modified Independent

## 2023-09-29 NOTE — Progress Notes (Signed)
 Speech Language Pathology Daily Session Note  Patient Details  Name: Brandon Watts MRN: [id number] Date of Birth: Aug 22, 1945  Today's Date: 09/29/2023 SLP Individual Time: 1030-1130 SLP Individual Time Calculation (min): 60 min  Short Term Goals: Week 2: SLP Short Term Goal 1 (Week 2): Pt will complete oropharyngeal exercises w/ s visual/verbal cues SLP Short Term Goal 2 (Week 2): Pt will utilize safe swallow strategies during PO intake with s cues SLP Short Term Goal 3 (Week 2): Pt will present w/ overt s/s of airway invasion 75% of the time or less during PO intake  Skilled Therapeutic Interventions:   Pt greeted in his room. He was awake/alert in his chair upon SLP arrival. Given concerns from MD and therapy team re cognitive function, a diagnostic tx task was completed. He was able to recall information from this morning and yesterday afternoon. Pt score a 26/30 overall on the SLUMS Exam; just one point away from being WNL. He presented w/ mild information processing deficits as well as deficits w/ attention to detail. Additionally, he reported some specific word finding deficits at baseline. 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 Cognition remains variable overall and is often negatively impacted by fatigue, attention/alertness, pain medication, and/or lack of sleep. He was agreeable to introduction of cognitive goals in addition to current dysphagia POC. POC updated to include cog goals.   At the end of cognitive tasks, he was able to complete masako x10 independently. He was then left in his chair with the alarm set and call light within reach. Recommend cont ST.   Pain  3/10 bilateral shoulder pain. Politely declined any pain remedies at this time.   Therapy/Group: Individual Therapy  [name] 09/29/2023, 4:21 PM

## 2023-09-30 LAB — COMPREHENSIVE METABOLIC PANEL
ALT: 33 U/L (ref 0–44)
AST: 21 U/L (ref 15–41)
Albumin: 3.4 g/dL — ABNORMAL LOW (ref 3.5–5.0)
Alkaline Phosphatase: 61 U/L (ref 38–126)
Anion gap: 6 (ref 5–15)
BUN: 17 mg/dL (ref 8–23)
CO2: 28 mmol/L (ref 22–32)
Calcium: 10.9 mg/dL — ABNORMAL HIGH (ref 8.9–10.3)
Chloride: 106 mmol/L (ref 98–111)
Creatinine, Ser: 0.91 mg/dL (ref 0.61–1.24)
GFR, Estimated: >60 mL/min (ref >60)
Glucose, Bld: 97 mg/dL (ref 70–99)
Potassium: 4.1 mmol/L (ref 3.5–5.1)
Sodium: 140 mmol/L (ref 135–145)
Total Bilirubin: 1.2 mg/dL (ref 0.0–1.2)
Total Protein: 6.6 g/dL (ref 6.5–8.1)

## 2023-09-30 LAB — CBC
HCT: 40.2 % (ref 39.0–52.0)
Hemoglobin: 13.4 g/dL (ref 13.0–17.0)
MCH: 31.8 pg (ref 26.0–34.0)
MCHC: 33.3 g/dL (ref 30.0–36.0)
MCV: 95.5 fL (ref 80.0–100.0)
Platelets: 267 10*3/uL (ref 150–400)
RBC: 4.21 MIL/uL — ABNORMAL LOW (ref 4.22–5.81)
RDW: 11.9 % (ref 11.5–15.5)
WBC: 8.3 10*3/uL (ref 4.0–10.5)
nRBC: 0 % (ref 0.0–0.2)

## 2023-09-30 MED ORDER — TAMSULOSIN HCL 0.4 MG PO CAPS
0.4000 mg | ORAL_CAPSULE | Freq: Every day | ORAL | Status: DC
  Administered 2023-09-30 – 2023-10-04 (×5): 0.4 mg via ORAL
  Filled 2023-09-30 (×5): qty 1

## 2023-09-30 MED ORDER — MIDODRINE HCL 5 MG PO TABS
10.0000 mg | ORAL_TABLET | Freq: Three times a day (TID) | ORAL | Status: DC
  Administered 2023-09-30 – 2023-10-11 (×33): 10 mg via ORAL
  Filled 2023-09-30 (×33): qty 2

## 2023-09-30 MED ORDER — BACLOFEN 5 MG HALF TABLET
5.0000 mg | ORAL_TABLET | Freq: Three times a day (TID) | ORAL | Status: DC | PRN
  Administered 2023-09-30 – 2023-10-10 (×14): 5 mg via ORAL
  Filled 2023-09-30 (×16): qty 1

## 2023-09-30 NOTE — Plan of Care (Signed)
  Problem: Consults Goal: RH SPINAL CORD INJURY PATIENT EDUCATION Description:  See Patient Education module for education specifics.  Outcome: Progressing   Problem: SCI BOWEL ELIMINATION Goal: RH STG MANAGE BOWEL WITH ASSISTANCE Description: STG Manage Bowel with mod I Assistance. Outcome: Progressing Goal: RH STG SCI MANAGE BOWEL WITH MEDICATION WITH ASSISTANCE Description: STG SCI Manage bowel with medication with mod I assistance. Outcome: Progressing   Problem: SCI BLADDER ELIMINATION Goal: RH STG MANAGE BLADDER WITH ASSISTANCE Description: STG Manage Bladder With mod I Assistance Outcome: Progressing Goal: RH STG MANAGE BLADDER WITH MEDICATION WITH ASSISTANCE Description: STG Manage Bladder With Medication With mod I Assistance. Outcome: Progressing   Problem: RH SAFETY Goal: RH STG ADHERE TO SAFETY PRECAUTIONS W/ASSISTANCE/DEVICE Description: STG Adhere to Safety Precautions With cues Assistance/Device. Outcome: Progressing   Problem: RH PAIN MANAGEMENT Goal: RH STG PAIN MANAGED AT OR BELOW PT'S PAIN GOAL Description: < 4 with prns Outcome: Progressing   Problem: RH KNOWLEDGE DEFICIT SCI Goal: RH STG INCREASE KNOWLEDGE OF SELF CARE AFTER SCI Description: Patient and spouse will be able to mange care using educational resources for medication, spinal cord injury, dietary modifications and skin care independently Outcome: Progressing

## 2023-09-30 NOTE — Progress Notes (Signed)
 Speech Language Pathology Daily Session Note  Patient Details  Name: Brandon Watts MRN: [id number] Date of Birth: Oct 26, 1945  Today's Date: 09/30/2023 SLP Individual Time: 0800-0845 SLP Individual Time Calculation (min): 45 min  Short Term Goals: Week 2: SLP Short Term Goal 1 (Week 2): Pt will complete oropharyngeal exercises w/ s visual/verbal cues SLP Short Term Goal 2 (Week 2): Pt will utilize safe swallow strategies during PO intake with s cues SLP Short Term Goal 3 (Week 2): Pt will present w/ overt s/s of airway invasion 75% of the time or less during PO intake  Skilled Therapeutic Interventions:   Pt and his wife greeted at bedside after morning meal. He was very pleasant and cooperative throughout tx tasks targeting cognition and dysphagia. He completed a deductive reasoning task @ modI and demonstrated adequate information processing, working memory, and organization throughout. Additionally, he was able to recall details from yesterday after ST tx session, this morning, and details re his meds. He then completed 3 pharyngeal strengthening exercises (masako, open mouth, mendelsohn) x10 each independently. Final tx task comprised of specific word finding task that also targeted working memory and information processing. He was able to complete task @ modI level despite reduced attention/alertness setting in from morning meds. Anticipate cognition and carryover will continue to vary slightly given pain medication, attention, and fatigue; however, he tolerated introduction of complex cognitive tasks well today. At the end of tasks, he was left in his bed with the call light within reach. Recommend cont ST.   Pain  Pt reported "mild" shoulder pain. Pain medication received prior to ST tx session.   Therapy/Group: Individual Therapy  [name] 09/30/2023, 8:20 AM

## 2023-09-30 NOTE — Progress Notes (Signed)
 Occupational Therapy Session Note  Patient Details  Name: ADA WOODBURY MRN: [id number] Date of Birth: 01/01/1946  Today's Date: 09/30/2023 OT Individual Time: 0815-0900 OT Individual Time Calculation (min): 45 min    Short Term Goals: Week 2:  OT Short Term Goal 1 (Week 2): Pt will perform LB bathing with Mod A (x1) + LRAD. OT Short Term Goal 2 (Week 2): Pt will perform LB dressing with Mod A (x1) + LRAD. OT Short Term Goal 3 (Week 2): Pt will complete toileting tasks with mod A OT Short Term Goal 4 (Week 2): Pt will perform UB dressing tasks with mod A  Skilled Therapeutic Interventions/Progress Updates:    OT intervention with focus on bed mobility, sitting balance, LB dressing, sit<>stand, standing balance, functional transfers, LUE functional reaching/strengtheing, and safety awareness to increase pt's independence with BADLs. Pt resting in bed with HOB elevated. Supine>sit EOB with supervision without use of bed rails. Sitting balance with close supervision while pt threaded BLE into pants without use of reacher. Sit<>stand with CGA. Standing balance with CGA while pt initiated pulling pants over hips. Pt required assistance to complete pulling pants over hips and snapping pants. Pt completred stand step transfer to w/c with CGA using RW. LUE resistance strengthening while seated in w/c. Pt remained in w/c awaiting Family Conference immediately following theapy session.   Therapy Documentation Precautions:  Precautions Precautions: Fall, Cervical Precaution/Restrictions Comments: Foley Required Braces or Orthoses: Cervical Brace Cervical Brace: Hard collar, Soft collar Restrictions Weight Bearing Restrictions Per Provider Order: No   Pain:  Pt reports BUE/shoulder (upper traps) discomfort; repositioned   Therapy/Group: Individual Therapy  [name] 09/30/2023, 10:47 AM

## 2023-09-30 NOTE — Progress Notes (Signed)
 PROGRESS NOTE   Subjective/Complaints:  Pt reports knows getting foley removed today- to cath- will try Flomax to see if can pee at all- but needs increased midodrine to compensate for lower BP.   Feels brain "slipping"- has stopped continuing education exams for now- sent message to board. Got cognitive testing yesterday- was worse than he expected.  Also had episode of legs twitching "uncontrollably" - las tnight- only went away after wife massaged for long period.  LBM yesterday- somewhat loose- has urgency.  Has episode BP dropped when got to Bathroom- no TEDs/abd binder- almost passed out after BM yesterday,   ROS:   Pt denies SOB, abd pain, CP, N/V/C/D, and vision changes    restlessness (+)-better not resolved L>R shoulder pain R shoulder somewhat better but still has hypalgesia and L shoulder pain Objective:   No results found.   Recent Labs    09/30/23 0611  WBC 8.3  HGB 13.4  HCT 40.2  PLT 267   Recent Labs    09/30/23 0611  NA 140  K 4.1  CL 106  CO2 28  GLUCOSE 97  BUN 17  CREATININE 0.91  CALCIUM 10.9*    Intake/Output Summary (Last 24 hours) at 09/30/2023 1014 Last data filed at 09/30/2023 0446 Gross per 24 hour  Intake 480 ml  Output 1800 ml  Net -1320 ml        Physical Exam: Vital Signs Blood pressure 122/60, pulse (!) 49, temperature 98.1 F (36.7 C), resp. rate 18, height 5\' 8"  (1.727 m), weight 58.4 kg, SpO2 100%.           General: awake, alert, appropriate, sitting up in bed; wife there for latter half of interview; NAD HENT: conjugate gaze; oropharynx moist CV: regular rhythm, bradycardic  rate; no JVD Pulmonary: CTA B/L; no W/R/R- good air movement GI: soft, NT, ND, (+)BS Psychiatric: appropriate Neurological: more alert and interactive Mild memory deficits seen  3 beats clonus in LE's- no increased tone seen MSK: Very TTP with hyperalgesia over L deltoid and  shoulder laterally- NO change today RUE- Biceps 4-/5; WE 4/5; Triceps 4-/5; Grip 3+ to 4-/5, FA 3+/5 LUE_ Biceps 2+/5; WE 3+/5; Triceps 2+/5; Grip 2+/5; and FA 2/5 less  TTP over R anterior shoulder with now no pain with resisted flexion- trp palpated in R upper trap- same pattern in L shoulder as well  Skin: C/D/I. No apparent lesions.IV intact   MSK:      No apparent deformity. R shoulder AROM in abduction limited to <90 degrees. Non-painful. Left arm is extremely tender to touch and PROM       Neurologic exam:  Cognition: AAO to person, place, time and event?.  Language: Fluent, Mild dysarthria.   Insight: Fair insight into current condition.    Sensation: To light touch hypersensitive in LUE at shoulder  Reflexes :Negative Hoffman's and babinski signs bilaterally.  CN: 2-12 grossly intact.  Coordination: No apparent tremors. Weakness limits FNF on left ; + Mild LLE HTS Spasticity: MAS 0 in all extremities. Flaccid LUE.       Assessment/Plan: 1. Functional deficits which require 3+ hours per day of interdisciplinary therapy in a  comprehensive inpatient rehab setting. Physiatrist is providing close team supervision and 24 hour management of active medical problems listed below. Physiatrist and rehab team continue to assess barriers to discharge/monitor patient progress toward functional and medical goals  Care Tool:  Bathing    Body parts bathed by patient: Chest, Abdomen   Body parts bathed by helper: Buttocks, Right lower leg, Left lower leg, Right arm, Left arm, Front perineal area, Right upper leg, Left upper leg, Face     Bathing assist Assist Level: Maximal Assistance - Patient 24 - 49%     Upper Body Dressing/Undressing Upper body dressing   What is the patient wearing?: Pull over shirt    Upper body assist Assist Level: Maximal Assistance - Patient 25 - 49%    Lower Body Dressing/Undressing Lower body dressing      What is the patient wearing?: Pants      Lower body assist Assist for lower body dressing: Total Assistance - Patient < 25%     Toileting Toileting    Toileting assist Assist for toileting: Total Assistance - Patient < 25%     Transfers Chair/bed transfer  Transfers assist     Chair/bed transfer assist level: Contact Guard/Touching assist     Locomotion Ambulation   Ambulation assist   Ambulation activity did not occur: Safety/medical concerns (2/2 endurance deficit/ limited activity tolerance)  Assist level: Contact Guard/Touching assist Assistive device: Walker-rolling Max distance: 150   Walk 10 feet activity   Assist  Walk 10 feet activity did not occur: Safety/medical concerns (2/2 endurance deficit/ limited activity tolerance)  Assist level: Contact Guard/Touching assist Assistive device: Walker-rolling   Walk 50 feet activity   Assist Walk 50 feet with 2 turns activity did not occur: Safety/medical concerns (2/2 endurance deficit/ limited activity tolerance)  Assist level: Minimal Assistance - Patient > 75% Assistive device: Walker-rolling    Walk 150 feet activity   Assist Walk 150 feet activity did not occur: Safety/medical concerns (2/2 endurance deficit/ limited activity tolerance)  Assist level: Contact Guard/Touching assist Assistive device: Walker-rolling    Walk 10 feet on uneven surface  activity   Assist Walk 10 feet on uneven surfaces activity did not occur: Safety/medical concerns (d/t limited activity tolerance and imbalance)         Wheelchair     Assist Is the patient using a wheelchair?: Yes Type of Wheelchair: Manual Wheelchair activity did not occur: Safety/medical concerns (2/2 endurance deficit/ limited activity tolerance)  Wheelchair assist level: Dependent - Patient 0%      Wheelchair 50 feet with 2 turns activity    Assist    Wheelchair 50 feet with 2 turns activity did not occur: Safety/medical concerns (2/2 endurance deficit/ limited  activity tolerance)       Wheelchair 150 feet activity     Assist  Wheelchair 150 feet activity did not occur: Safety/medical concerns (2/2 endurance deficit/ limited activity tolerance)       Blood pressure 122/60, pulse (!) 49, temperature 98.1 F (36.7 C), resp. rate 18, height 5\' 8"  (1.727 m), weight 58.4 kg, SpO2 100%.   Medical Problem List and Plan: 1. Functional deficits due to SCI (central cord syndrome)- C4 ASIA C/D (incomplete quadriplegia)  with C4 fracture s/p arthrodesis C3-4 anterior interbody technique discectomy for decompression of spinal cord and exiting nerve roots with foraminotomies as well as additional level C4-5 interbody technique for decompression placement of anterior instrumentation consisting of interbody plate and screws C3-4-5 09/09/2023.  Cervical collar at  all times             -patient may  shower             -ELOS/Goals: 18 to 21 days, min assist PT/OT/SLP                            -On admission Dr. Maisie Fus cleared patient for soft collar when in bed for comfort; ortho tech order placed for soft collar and nursing order for soft collar in bed, alternating to hard collar when OOB   - will allow dog to come in from home with papers  - not ready for grounds pass quite yet  Started estim D/c 3/31 Con't CIR PT, OT and SLP Got 26/30 on MMSE this AM Family conference today to educate pt and wife about SCI, SDH and associated issues with SCI 2.  Antithrombotics: -DVT/anticoagulation/segmental LUL PE:  Pharmaceutical: Lovenox initiated 09/11/2023 and titrated.  Neurosurgery Dr. Maisie Fus stating can switch to DOAC 3/7, since last hemoglobin 3/5 will wait until a.m. labs and switch if stable 3/8- pt refused labs this AM- will wait til Monday when have labs 3/12- Switched to Eliquis 5 mg BID from Tx dose lovenox  3/20- will need for 3 months total- went over that increased risk of DVT/PE is for a total of 3 months for since has 2 PE's             -antiplatelet  therapy: N/A   3. Pain Management: OxyContin sustained release 10 mg every 12 hours, Neurontin 300 mg 3 times daily Robaxin as needed.  Will add tramadol as needed   3/8- pt reports pain usually controlled- con't regimen  3/10--still having a lot of LUE--increase gabapentin to 400mg  tid  3/11- change Tylenol to 1000 mg q8 hours per pt request  3/12- has been helpful to add tylenol  3/17- has tramadol- not really using much- last used 3/14  3/18- 3/19 still has hyperalgesia- of LUE but we discussed using other nerve pain meds- but wait since they will cause urinary retention- doing desensitization techniques  -also refused MRI of L shoulder since "hates MRI machines"   3/20- will add Baclofen 5 mg TID prn for spasms and stop Robaxin 4. Mood/Behavior/Sleep/Hallucinations: Melatonin 5 mg nightly.  Xanax Dced on admission per family d/t confusion.              - antipsychotic agents: Seroquel 12.5-25 mg PRN              - Delirium precautions with stimulation during the daytime, quiet with low lights an minimal interruptions at nighttime, limit lines/tubes/drains to essential access only, limit physical and chemical restraints               - Melatonin 5 mg at bedtime, trazodone 25-50 mg PRN.  - May benefit from standing at bedtime seroquel if trazodone ineffective.    3/8- slept OK- will con't regimen for now  3/10 continues to struggle with sleep, partially due to pain    -gabapentin increased above   -will schedule trazodone @2100  with back up dose prn       -pt voiced some feeling of depression over his accident/ condition -he is interested in speaking with neuropsychology. -will ask Dr. Kieth Brightly to see patient regarding reactive depression. 3/140- added Seroquel yesterday for psychomotor agitation- will increase to 25 mg at bedtime tonight  3/17- sleeping great- feels more confused/delayed this AM- don't  see a medicine that could cause this 3/18- off Trazodone due to urinary issues-  admits very sad- but doesn't want to add meds! 3/19- Pt refuses SSRI for mood 5. Neuropsych/cognition/small SDH: This patient is not completely capable of making decisions on his own behalf.              - telesitter continued --renewed daily    3/20- got off Telesitter 6. Skin/Wound Care: Routine skin checks 7. Fluids/Electrolytes/Nutrition/hyponatremia: Routine in and outs with follow-up chemistries    Latest Ref Rng & Units 09/30/2023    6:11 AM 09/27/2023    5:40 AM 09/25/2023    5:27 AM  BMP  Glucose 70 - 99 mg/dL 97  563  875   BUN 8 - 23 mg/dL 17  28  24    Creatinine 0.61 - 1.24 mg/dL 6.43  3.29  5.18   Sodium 135 - 145 mmol/L 140  136  139   Potassium 3.5 - 5.1 mmol/L 4.1  4.3  4.6   Chloride 98 - 111 mmol/L 106  104  102   CO2 22 - 32 mmol/L 28  28  29    Calcium 8.9 - 10.3 mg/dL 84.1  66.0  63.0     8.  Dysphagia. Cortrak placed 3/5.   Currently on a mechanical soft diet with MBS pending Advance diet as tolerated per speech therapy              - On TF with 50 ml/hr osmolite 1.5 + prosource 60 ml daily              - Dietary consulted for management   3/8- will add IVFs, pt insistent- for dehydration- refused labs today so cannot see Cr/BUN to assess- but says not drinking enough  3/10--changed ivf to free water thru tube--encourage PO as well  **update 4:30pm--pt cleared for D2/thins per SLP!   -RD transitioning to nocturnal feeds beginning at 0500 tomorrow  3/11- fed self breakfast this Am and dinner last night for first time  3/12- con't cortrak  3/13- Got OK by Dietician to remove Cortrak and is on D3 thin diet, so no need for steroids- and would probably do better if remove Cortrak- will remove today and monitor  3/14- doing ok without Cortrak  3/18- Is dry- BUN 28- will have pt push fluids 9.  Hypertension with Orthostatic hypotension.  Cozaar 50 mg daily.  Monitor with increased mobility.              - Holding home hydrochlorothiazide    3/8- BP 110s- a little soft-  monitor for Orthostatic hypotension  3/11- will decrease Cozaar to 25 mg daily and add Midodrine 2.5 mg TID with meals- to start at 6:30am since almost passed out 2x per PT.   3/12- BP dropped to 58 systolic yesterday-added Midodrine- BP is slightly elevated in supine, but dropping dramatically in spite of TEDs and ACE wraps   3/13- Increased Midodrine to 5 mg TID- will con't Abd binder, TEDs and ACE wraps.   3/14- BP did better with OT today lowest low 100s systolic, with abd binder, TED's, ACE wraps and midodrine Vitals:   09/29/23 2107 09/30/23 0445  BP: (!) 142/76 122/60  Pulse: 61 (!) 49  Resp: 20 18  Temp: 98 F (36.7 C) 98.1 F (36.7 C)  SpO2: 97% 100%  Patient still on losartan will discontinue. Have also reduced midodrine dose. Off scopolamine which can also lower blood pressure  3/17- BP  still soft supine in bed- have ordered Orthostatics by nursing- and will see if need to increase Midodrine again, due to soft BP with laying down  3/18- will increase Midodrine to 5 mg TID since BP dropped to 70's yesterday and almost passed out. 3/19- BP lying down 140s yesterday but better this AM-  3/20- Pt and I agree a little Permissive elevated BP supine is better than dropping BP in therapy- will increase Midodrine ot compensate for Low BP form Flomax starting.   10.  BPH with likely neurogenic bladder.  Proscar 5 mg nightly.  Check PVR             - Has coude catheter placed 2/27, patient hesitant to remove, OK to keep tonight and would re-address in AM - states he needs to be able to stand to urinate d/t BPH   3/8- explained can wait 2 days, but then have to remove foley-   3/10---remove foley--voiding trial  3/12- Requiring caths- no real voiding so far - will add Urecholine, because OH makes it difficult to add Flomax. Add 5 mg TID for now- monitor  3/13- will wait on Flomax- until BP doing better- bladder scans and caths up to 1000cc- needs to do every 4 hours based on cath  results-d/w nursing 3/14- wrote order for coude' caths only and lidocaine jelly with caths  Midodrine is an alpha-blocker which also may be affecting bladder outflow, will start weaning today.  Scopolamine has anticholinergic effects which can also reduce bladder emptying and has been discontinued.  Certainly his urinary issues may be related to his spinal cord injury but will try to minimize pharmaceutical effects of his meds 3/17- they placed Foley this weekend due to trauma- will keep 3-4 days then d/c.   3/18- will d/c foley Thursday AM since placed Sunday night-for trauma- pt said "happy for break from cathing"  3/19- will remove Foley in AM/Thursday since don't want pt getting UTI 3/20- will d/c Foley today- wil add Flomax and increase Midodrine to help combat the [facility] will get from Flomax.  11.  Constipation with likely neurogenic bowel.   No BM since admission               - Miralax 17 g BID, sennakot S 2 tabs BID              - PRN sorbitol for moderate constipation, PRN fleet enema for severe constipation   3/8- will give 45 cc Sorbitol and if no results, Soap suds enema this evening  3/10- bm's continent yesterday--type 6--no changes to regimen today  3/11- Strained but not able to go this AM- will montior  3/12- LBM yesterday per pt- but 3/9 per chart- will check with therapy- if no results, will need more intervention- already on Miralax BID, Senna 2 tabs BID  3/13- LBM yesterday  3/14- LBM 2 days ago  3/18- LBM 2 days ago  3/19- LBM  this AM 12.  Acute respiratory failure with hypoxia/bilateral pneumonia.  Continue cefepime x 7 days.  Pulmonary services have signed off.              - Ongoing significant secretions with frequent suctioning              - Robitussin 5 mg Q6H             - Biotene PRN   3/8- running Cefepime now- con't 7 days total  3/9- CXR looks good- only mild L  sided atelectasis - added Scopolamine due to increased secretions for a few days only  3/12- changed  Duonebs back to QID from prn  3/13- improved secretions with Scopolamine - this may be contributing to retention no issues with secretions consider d/c  3/17- Scopolamine stopped  3/20- still using O2 at night- 2L- want sot have at home- knows insurance won't cover it- but wants for airhunger- still using ICS regularly.   13. L shoulder weakness and pain and new R shoulder pain.  No fracture on xray 2/26.              - Likely due to L C4 myelopathy; however no accompanying sensory deficit and no other weakness. I suspect a radicular component also  - Monitor with therapies; would consider MRI shoulder for rotator cuff pathology if no improvement   3/13- Pt's main pain is anterior and upper trap on L side- will con't to monitor- doesn't want steroid injections at this time- R shoulder pain started overnight from pulling in bed- appears to be biceps tendinitis- wants to wait on steroids due to concern with hallucinations  3/14- ended up doing steroid injection with 20mg  of kenalog- pain doing better  3/19- pt refused L shoulder MRI  Trap pain with substitution for delt weakness, add sportscream 14. Restlessness/psychomotor agitation  3/13- will try Seroquel 12.5 mg at bedtime- d/w TBI MD and she agreed it's better than Depakote.   3/14- increasing seroquel to 25 mg QHS  Sleeping ok, now on seroquel , d/c trazodone, anticholinergic effect     I spent a total of  59  minutes on total care today- >50% coordination of care- due to  Pt seen twice and also did family conference and went over all medical issues- using ICS and O2 at night for air hunger- also d/w pt about Flomax and increasing Midodrine. Also spoke with OT about shower tomorrow.    LOS: 13 days A FACE TO FACE EVALUATION WAS PERFORMED  Ulysees Robarts 09/30/2023, 10:14 AM

## 2023-09-30 NOTE — Progress Notes (Signed)
 Occupational Therapy Session Note  Patient Details  Name: Brandon Watts MRN: [id number] Date of Birth: 1945/10/24  Today's Date: 09/30/2023 OT Co-Treatment Time: 0945-1000 OT Co-Treatment Time Calculation (min): 15 min Cotx with PT 0945-1030) Total time 45 min  Short Term Goals: Week 2:  OT Short Term Goal 1 (Week 2): Pt will perform LB bathing with Mod A (x1) + LRAD. OT Short Term Goal 2 (Week 2): Pt will perform LB dressing with Mod A (x1) + LRAD. OT Short Term Goal 3 (Week 2): Pt will complete toileting tasks with mod A OT Short Term Goal 4 (Week 2): Pt will perform UB dressing tasks with mod A  Skilled Therapeutic Interventions/Progress Updates:    Family conference with pt, wife, MD, CSW, RN, and PT in room. Reviewed pt's progress, home setup, DME requirements/recommendations, and discharge planning. Pt remained in w/c with all needs within reach. Wife present  Therapy Documentation Precautions:  Precautions Precautions: Fall, Cervical Precaution/Restrictions Comments: Foley Required Braces or Orthoses: Cervical Brace Cervical Brace: Hard collar, Soft collar Restrictions Weight Bearing Restrictions Per Provider Order: No   Pain:  Pt reports bil shoulder discomfort (upper traps); repositioned  Therapy/Group: Co-Treatment  [name] 09/30/2023, 10:41 AM

## 2023-09-30 NOTE — Progress Notes (Signed)
 Physical Therapy Session Note  Patient Details  Name: Brandon Watts MRN: [id number] Date of Birth: Apr 18, 1946  Today's Date: 09/30/2023 PT Individual Time: 1000-1025, 1300-1415 PT Individual Time Calculation (min): 25 min, 75 min   Short Term Goals: Week 2:  PT Short Term Goal 1 (Week 2): Pt will perform stand pivot transfers with SBA + LRAD PT Short Term Goal 2 (Week 2): Pt will ambulate over uneven ground with CGA and LRAD PT Short Term Goal 3 (Week 2): Pt will initiate car transfer training PT Short Term Goal 4 (Week 2): Pt will ambulate 4 steps with BHR per home set up with minA  Skilled Therapeutic Interventions/Progress Updates:   Session 1: Pt was seen for family meeting with PT, OT, MD, nsg, and CSW present. No complaint of pain at this time. PT provided education on equipment recommendation and dicussed home set up. Discussed pt's son providing physical assist and pt directing his care. Session concluded with pt seated in chair and needs in reach.    Session 2: Pt recd seated EOB with his wife present, No complaint of pain. Pt ambulated throughout session with CGA and frequent check ins to manage BP. Pt became mildly lightheaded at times, managed with rest breaks.   Session focused on transfers and discussion of home set up. Pt performed  car transfer with CGA and navigated ramp in the same manner. Pt also navigated x 16 steps with R hand rail and min a. Pt used self selected alternating pattern to ascend  and was directed to use step to pattern while descending d/t using weaker L hand on rail. Pt also performed transfers from sofa and recliner to mimic home furniture without difficulty.   Pt returned to room and to bed with CGA. Therapist retrieved standard w/c to trial vs TIS. Therapist then provided education to pt's wife on wrapping with ace wraps using figure 8 pattern for improved compression and to prevent uneven compression from spiral wrapping. Pt's wife was able to return  demonstrate with mod cueing. Pt then ambulated to bathroom with CGA and remained seated on commode to attempt void, nsg made aware of position.   Therapy Documentation Precautions:  Precautions Precautions: Fall, Cervical Precaution/Restrictions Comments: Foley Required Braces or Orthoses: Cervical Brace Cervical Brace: Hard collar, Soft collar Restrictions Weight Bearing Restrictions Per Provider Order: No General:      Therapy/Group: Individual Therapy  Juluis Rainier 09/30/2023, 2:19 PM

## 2023-10-01 MED ORDER — CHLORHEXIDINE GLUCONATE CLOTH 2 % EX PADS
6.0000 | MEDICATED_PAD | Freq: Every day | CUTANEOUS | Status: DC
  Administered 2023-10-02 – 2023-10-03 (×2): 6 via TOPICAL

## 2023-10-01 NOTE — Progress Notes (Signed)
 Placed coude caths Fr. 16 in room 2 different brands, placed a closed system and e-z gripper closed system as well. Depends on what patient prefers is what we are going to use. Nurse assigned to patient educated, patient and wife in room and aware of the plan.

## 2023-10-01 NOTE — Progress Notes (Addendum)
 PROGRESS NOTE   Subjective/Complaints:  Pt reports was woken up a lot of be bladder scanned and cathed overnight- still no voiding as of yet.  Doesn't want to be cathed q4 hours and actually asked to not be woken up to cath at all- explained q8 hour caths will increase risk of UTIs as well as AD- and educated on Autonomic dysreflexia.   Had to take baclofen last night due ot spasms- was helpful, but didn't help (as expected)_ bladder spasms he was having.  More feeling in his bladder.  Hasn't learned to cath as of yet   ROS:   Pt denies SOB, abd pain, CP, N/V/C/D, and vision changes   restlessness (+)-better not resolved L>R shoulder pain R shoulder somewhat better but still has hypalgesia and L shoulder pain Objective:   No results found.   Recent Labs    09/30/23 0611  WBC 8.3  HGB 13.4  HCT 40.2  PLT 267   Recent Labs    09/30/23 0611  NA 140  K 4.1  CL 106  CO2 28  GLUCOSE 97  BUN 17  CREATININE 0.91  CALCIUM 10.9*    Intake/Output Summary (Last 24 hours) at 10/01/2023 0917 Last data filed at 10/01/2023 0244 Gross per 24 hour  Intake 480 ml  Output 2272 ml  Net -1792 ml        Physical Exam: Vital Signs Blood pressure 119/71, pulse 60, temperature 97.8 F (36.6 C), temperature source Oral, resp. rate 16, height 5\' 8"  (1.727 m), weight 69.2 kg, SpO2 99%.            General: awake, alert, appropriate, just woke up; wife at bedside; NAD HENT: conjugate gaze; oropharynx moist CV: regular rate and rhythm; no JVD Pulmonary: CTA B/L; no W/R/R- good air movement GI: soft, NT, ND, (+)BS- normoactive Psychiatric: appropriate Neurological: Ox3- less tangential   3 beats clonus in LE's- no increased tone seen MSK: Very TTP with hyperalgesia over L deltoid and shoulder laterally- NO change today RUE- Biceps 4-/5; WE 4/5; Triceps 4-/5; Grip 3+ to 4-/5, FA 3+/5 LUE_ Biceps 2+/5; WE 3+/5;  Triceps 2+/5; Grip 2+/5; and FA 2/5 less  TTP over R anterior shoulder with now no pain with resisted flexion- trp palpated in R upper trap- same pattern in L shoulder as well  Skin: C/D/I. No apparent lesions.IV intact   MSK:      No apparent deformity. R shoulder AROM in abduction limited to <90 degrees. Non-painful. Left arm is extremely tender to touch and PROM       Neurologic exam:  Cognition: AAO to person, place, time and event?.  Language: Fluent, Mild dysarthria.   Insight: Fair insight into current condition.    Sensation: To light touch hypersensitive in LUE at shoulder  Reflexes :Negative Hoffman's and babinski signs bilaterally.  CN: 2-12 grossly intact.  Coordination: No apparent tremors. Weakness limits FNF on left ; + Mild LLE HTS Spasticity: MAS 0 in all extremities. Flaccid LUE.       Assessment/Plan: 1. Functional deficits which require 3+ hours per day of interdisciplinary therapy in a comprehensive inpatient rehab setting. Physiatrist is providing close team supervision  and 24 hour management of active medical problems listed below. Physiatrist and rehab team continue to assess barriers to discharge/monitor patient progress toward functional and medical goals  Care Tool:  Bathing    Body parts bathed by patient: Chest, Abdomen   Body parts bathed by helper: Buttocks, Right lower leg, Left lower leg, Right arm, Left arm, Front perineal area, Right upper leg, Left upper leg, Face     Bathing assist Assist Level: Maximal Assistance - Patient 24 - 49%     Upper Body Dressing/Undressing Upper body dressing   What is the patient wearing?: Pull over shirt    Upper body assist Assist Level: Maximal Assistance - Patient 25 - 49%    Lower Body Dressing/Undressing Lower body dressing      What is the patient wearing?: Pants     Lower body assist Assist for lower body dressing: Total Assistance - Patient < 25%     Toileting Toileting    Toileting  assist Assist for toileting: Total Assistance - Patient < 25%     Transfers Chair/bed transfer  Transfers assist     Chair/bed transfer assist level: Contact Guard/Touching assist     Locomotion Ambulation   Ambulation assist   Ambulation activity did not occur: Safety/medical concerns (2/2 endurance deficit/ limited activity tolerance)  Assist level: Contact Guard/Touching assist Assistive device: Walker-rolling Max distance: 150   Walk 10 feet activity   Assist  Walk 10 feet activity did not occur: Safety/medical concerns (2/2 endurance deficit/ limited activity tolerance)  Assist level: Contact Guard/Touching assist Assistive device: Walker-rolling   Walk 50 feet activity   Assist Walk 50 feet with 2 turns activity did not occur: Safety/medical concerns (2/2 endurance deficit/ limited activity tolerance)  Assist level: Minimal Assistance - Patient > 75% Assistive device: Walker-rolling    Walk 150 feet activity   Assist Walk 150 feet activity did not occur: Safety/medical concerns (2/2 endurance deficit/ limited activity tolerance)  Assist level: Contact Guard/Touching assist Assistive device: Walker-rolling    Walk 10 feet on uneven surface  activity   Assist Walk 10 feet on uneven surfaces activity did not occur: Safety/medical concerns (d/t limited activity tolerance and imbalance)         Wheelchair     Assist Is the patient using a wheelchair?: Yes Type of Wheelchair: Manual Wheelchair activity did not occur: Safety/medical concerns (2/2 endurance deficit/ limited activity tolerance)  Wheelchair assist level: Dependent - Patient 0%      Wheelchair 50 feet with 2 turns activity    Assist    Wheelchair 50 feet with 2 turns activity did not occur: Safety/medical concerns (2/2 endurance deficit/ limited activity tolerance)       Wheelchair 150 feet activity     Assist  Wheelchair 150 feet activity did not occur:  Safety/medical concerns (2/2 endurance deficit/ limited activity tolerance)       Blood pressure 119/71, pulse 60, temperature 97.8 F (36.6 C), temperature source Oral, resp. rate 16, height 5\' 8"  (1.727 m), weight 69.2 kg, SpO2 99%.   Medical Problem List and Plan: 1. Functional deficits due to SCI (central cord syndrome)- C4 ASIA C/D (incomplete quadriplegia)  with C4 fracture s/p arthrodesis C3-4 anterior interbody technique discectomy for decompression of spinal cord and exiting nerve roots with foraminotomies as well as additional level C4-5 interbody technique for decompression placement of anterior instrumentation consisting of interbody plate and screws C3-4-5 09/09/2023.  Cervical collar at all times             -  patient may  shower             -ELOS/Goals: 18 to 21 days, min assist PT/OT/SLP                            -On admission Dr. Maisie Fus cleared patient for soft collar when in bed for comfort; ortho tech order placed for soft collar and nursing order for soft collar in bed, alternating to hard collar when OOB   - will allow dog to come in from home with papers  - not ready for grounds pass quite yet  Started estim D/c 3/31 Will start education on cathing Educated on AD Con't CIR PT and OT and SLP 2.  Antithrombotics: -DVT/anticoagulation/segmental LUL PE:  Pharmaceutical: Lovenox initiated 09/11/2023 and titrated.  Neurosurgery Dr. Maisie Fus stating can switch to DOAC 3/7, since last hemoglobin 3/5 will wait until a.m. labs and switch if stable 3/8- pt refused labs this AM- will wait til Monday when have labs 3/12- Switched to Eliquis 5 mg BID from Tx dose lovenox  3/20- will need for 3 months total- went over that increased risk of DVT/PE is for a total of 3 months for since has 2 PE's             -antiplatelet therapy: N/A   3. Pain Management: OxyContin sustained release 10 mg every 12 hours, Neurontin 300 mg 3 times daily Robaxin as needed.  Will add tramadol as needed    3/8- pt reports pain usually controlled- con't regimen  3/10--still having a lot of LUE--increase gabapentin to 400mg  tid  3/11- change Tylenol to 1000 mg q8 hours per pt request  3/12- has been helpful to add tylenol  3/17- has tramadol- not really using much- last used 3/14  3/18- 3/19 still has hyperalgesia- of LUE but we discussed using other nerve pain meds- but wait since they will cause urinary retention- doing desensitization techniques  -also refused MRI of L shoulder since "hates MRI machines"   3/20- will add Baclofen 5 mg TID prn for spasms and stop Robaxin  3/21- helped a lot when took last night 4. Mood/Behavior/Sleep/Hallucinations: Melatonin 5 mg nightly.  Xanax Dced on admission per family d/t confusion.              - antipsychotic agents: Seroquel 12.5-25 mg PRN              - Delirium precautions with stimulation during the daytime, quiet with low lights an minimal interruptions at nighttime, limit lines/tubes/drains to essential access only, limit physical and chemical restraints               - Melatonin 5 mg at bedtime, trazodone 25-50 mg PRN.  - May benefit from standing at bedtime seroquel if trazodone ineffective.    3/8- slept OK- will con't regimen for now  3/10 continues to struggle with sleep, partially due to pain    -gabapentin increased above   -will schedule trazodone @2100  with back up dose prn       -pt voiced some feeling of depression over his accident/ condition -he is interested in speaking with neuropsychology. -will ask Dr. Kieth Brightly to see patient regarding reactive depression. 3/140- added Seroquel yesterday for psychomotor agitation- will increase to 25 mg at bedtime tonight  3/17- sleeping great- feels more confused/delayed this AM- don't see a medicine that could cause this 3/18- off Trazodone due to urinary issues- admits very sad- but  doesn't want to add meds! 3/19- Pt refuses SSRI for mood 5. Neuropsych/cognition/small SDH: This patient is  not completely capable of making decisions on his own behalf.              - telesitter continued --renewed daily    3/20- got off Telesitter 6. Skin/Wound Care: Routine skin checks 7. Fluids/Electrolytes/Nutrition/hyponatremia: Routine in and outs with follow-up chemistries    Latest Ref Rng & Units 09/30/2023    6:11 AM 09/27/2023    5:40 AM 09/25/2023    5:27 AM  BMP  Glucose 70 - 99 mg/dL 97  841  324   BUN 8 - 23 mg/dL 17  28  24    Creatinine 0.61 - 1.24 mg/dL 4.01  0.27  2.53   Sodium 135 - 145 mmol/L 140  136  139   Potassium 3.5 - 5.1 mmol/L 4.1  4.3  4.6   Chloride 98 - 111 mmol/L 106  104  102   CO2 22 - 32 mmol/L 28  28  29    Calcium 8.9 - 10.3 mg/dL 66.4  40.3  47.4     8.  Dysphagia. Cortrak placed 3/5.   Currently on a mechanical soft diet with MBS pending Advance diet as tolerated per speech therapy              - On TF with 50 ml/hr osmolite 1.5 + prosource 60 ml daily              - Dietary consulted for management   3/8- will add IVFs, pt insistent- for dehydration- refused labs today so cannot see Cr/BUN to assess- but says not drinking enough  3/10--changed ivf to free water thru tube--encourage PO as well  **update 4:30pm--pt cleared for D2/thins per SLP!   -RD transitioning to nocturnal feeds beginning at 0500 tomorrow  3/11- fed self breakfast this Am and dinner last night for first time  3/12- con't cortrak  3/13- Got OK by Dietician to remove Cortrak and is on D3 thin diet, so no need for steroids- and would probably do better if remove Cortrak- will remove today and monitor  3/14- doing ok without Cortrak  3/18- Is dry- BUN 28- will have pt push fluids  3/21- educated pt to drink more- sounds like drinking "OK"- per wife, but BUN tells [place] he's dry 9.  Hypertension with Orthostatic hypotension.  Cozaar 50 mg daily.  Monitor with increased mobility.              - Holding home hydrochlorothiazide    3/8- BP 110s- a little soft- monitor for Orthostatic  hypotension  3/11- will decrease Cozaar to 25 mg daily and add Midodrine 2.5 mg TID with meals- to start at 6:30am since almost passed out 2x per PT.   3/12- BP dropped to 58 systolic yesterday-added Midodrine- BP is slightly elevated in supine, but dropping dramatically in spite of TEDs and ACE wraps   3/13- Increased Midodrine to 5 mg TID- will con't Abd binder, TEDs and ACE wraps.   3/14- BP did better with OT today lowest low 100s systolic, with abd binder, TED's, ACE wraps and midodrine Vitals:   09/30/23 1933 10/01/23 0258  BP: (!) 145/86 119/71  Pulse: 60 60  Resp: 16 16  Temp: 98.3 F (36.8 C) 97.8 F (36.6 C)  SpO2: 97% 99%  Patient still on losartan will discontinue. Have also reduced midodrine dose. Off scopolamine which can also lower blood pressure  3/17- BP  still soft supine in bed- have ordered Orthostatics by nursing- and will see if need to increase Midodrine again, due to soft BP with laying down  3/18- will increase Midodrine to 5 mg TID since BP dropped to 70's yesterday and almost passed out. 3/19- BP lying down 140s yesterday but better this AM-  3/20- Pt and I agree a little Permissive elevated BP supine is better than dropping BP in therapy- will increase Midodrine ot compensate for Low BP form Flomax starting.   3/21- waiting to see if dropped BP 10.  BPH with likely neurogenic bladder.  Proscar 5 mg nightly.  Check PVR             - Has coude catheter placed 2/27, patient hesitant to remove, OK to keep tonight and would re-address in AM - states he needs to be able to stand to urinate d/t BPH   3/8- explained can wait 2 days, but then have to remove foley-   3/10---remove foley--voiding trial  3/12- Requiring caths- no real voiding so far - will add Urecholine, because OH makes it difficult to add Flomax. Add 5 mg TID for now- monitor  3/13- will wait on Flomax- until BP doing better- bladder scans and caths up to 1000cc- needs to do every 4 hours based on cath  results-d/w nursing 3/14- wrote order for coude' caths only and lidocaine jelly with caths  Midodrine is an alpha-blocker which also may be affecting bladder outflow, will start weaning today.  Scopolamine has anticholinergic effects which can also reduce bladder emptying and has been discontinued.  Certainly his urinary issues may be related to his spinal cord injury but will try to minimize pharmaceutical effects of his meds 3/17- they placed Foley this weekend due to trauma- will keep 3-4 days then d/c.   3/18- will d/c foley Thursday AM since placed Sunday night-for trauma- pt said "happy for break from cathing"  3/19- will remove Foley in AM/Thursday since don't want pt getting UTI 3/20- will d/c Foley today- wil add Flomax and increase Midodrine to help combat the [facility] will get from Flomax.  3/21- no peeing yet- would like ot be cathed less often at night- but required a cath at <3 hours last night- also having bladder spasms- might need Myrbetriq??? Will see if recurs, because it's new- changed cathing to q6 hours 11.  Constipation with likely neurogenic bowel.   No BM since admission               - Miralax 17 g BID, sennakot S 2 tabs BID              - PRN sorbitol for moderate constipation, PRN fleet enema for severe constipation   3/8- will give 45 cc Sorbitol and if no results, Soap suds enema this evening  3/10- bm's continent yesterday--type 6--no changes to regimen today  3/11- Strained but not able to go this AM- will montior  3/12- LBM yesterday per pt- but 3/9 per chart- will check with therapy- if no results, will need more intervention- already on Miralax BID, Senna 2 tabs BID  3/13- LBM yesterday  3/14- LBM 2 days ago  3/18- LBM 2 days ago  3/19- LBM  this AM  3/21- LBM yesterday  12.  Acute respiratory failure with hypoxia/bilateral pneumonia.  Continue cefepime x 7 days.  Pulmonary services have signed off.              - Ongoing significant secretions with  frequent  suctioning              - Robitussin 5 mg Q6H             - Biotene PRN   3/8- running Cefepime now- con't 7 days total  3/9- CXR looks good- only mild L sided atelectasis - added Scopolamine due to increased secretions for a few days only  3/12- changed Duonebs back to QID from prn  3/13- improved secretions with Scopolamine - this may be contributing to retention no issues with secretions consider d/c  3/17- Scopolamine stopped  3/20- still using O2 at night- 2L- want sot have at home- knows insurance won't cover it- but wants for airhunger- still using ICS regularly.   13. L shoulder weakness and pain and new R shoulder pain.  No fracture on xray 2/26.              - Likely due to L C4 myelopathy; however no accompanying sensory deficit and no other weakness. I suspect a radicular component also  - Monitor with therapies; would consider MRI shoulder for rotator cuff pathology if no improvement   3/13- Pt's main pain is anterior and upper trap on L side- will con't to monitor- doesn't want steroid injections at this time- R shoulder pain started overnight from pulling in bed- appears to be biceps tendinitis- wants to wait on steroids due to concern with hallucinations  3/14- ended up doing steroid injection with 20mg  of kenalog- pain doing better  3/19- pt refused L shoulder MRI  3/21- pt was discussing a magnesium spray- which is fine to try for L shoulder,  Trap pain with substitution for delt weakness, add sportscream 14. Restlessness/psychomotor agitation  3/13- will try Seroquel 12.5 mg at bedtime- d/w TBI MD and she agreed it's better than Depakote.   3/14- increasing seroquel to 25 mg QHS  Sleeping ok, now on seroquel , d/c trazodone, anticholinergic effect  15. 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   Addendum: Per nursing- had trauma again with cathing- foley to be placed-  I spent a total of  52   minutes on total care today- >50% coordination of care- due to  Was in room for 35 minutes today educating pt and wife on bladder issues- I.e. cannot go 8 hours without being cathed if not peeing, because it dramatically increases risk of UTI- also d/w pt and wife about learning to cath , since going home in 10 days- also about Autonomic dysreflexia, educated on dx and what to do when it occurs.   LOS: 14 days A FACE TO FACE EVALUATION WAS PERFORMED  Quanta Robertshaw 10/01/2023, 9:17 AM

## 2023-10-01 NOTE — Progress Notes (Signed)
 Occupational Therapy Session Note  Patient Details  Name: Brandon Watts MRN: [id number] Date of Birth: 10-16-1945  Today's Date: 10/01/2023 OT Individual Time: 6578-4696 OT Individual Time Calculation (min): 85 min    Short Term Goals: Week 3:  OT Short Term Goal 1 (Week 3): STG=LTG 2/2 ELOS  Skilled Therapeutic Interventions/Progress Updates:    OT intervention with focus on bathing at shower level and dressing with sit<>stand from EOB. Abdominal binder, Ted hose, and Ace wrap applied prior to transfer to TTB in shower. Pt initiating LUE use to bathe RUE and BLE but required assistance to complete task. Pt able to reaise RUE to initiate washing hair. Standing in shower with CGA for pt to bathe buttocks with RUE and initiate use of LUE. Assistance to complete task. Pt reported lightheadedness when standing in shower. Pt returned to room and transferred to bed. OTA removed binder, Teds, and ace wraps. Demonstrated removal and replacement of Cervical colloar pads for pt's wife. Ted hose and Ace wraps applied. Supine>sit EOB to don shirt. Mod A for donning pullover shirt while seated EOB. Pants not donned at this time but pt stood EOB (no binder) to pull brief up. Lightheadedness reported. Pt returned to bed with HOB elevated. Pt pleased with shower and ability to complete as many tasks as he did during shower. Pt remained in bed with all needs within reach. Wife present.   Therapy Documentation Precautions:  Precautions Precautions: Fall, Cervical Precaution/Restrictions Comments: Foley Required Braces or Orthoses: Cervical Brace Cervical Brace: Hard collar, Soft collar Restrictions Weight Bearing Restrictions Per Provider Order: No   Pain:  Pt reports bil shoulder/scapulae discomfort; shower and repositioned   Therapy/Group: Individual Therapy  [name] 10/01/2023, 9:44 AM

## 2023-10-01 NOTE — Progress Notes (Signed)
 Physical Therapy Session Note  Patient Details  Name: Brandon Watts MRN: [id number] Date of Birth: [birth year]  Today's Date: 10/01/2023 PT Individual Time: 1020-1130 PT Individual Time Calculation (min): 70 min   Short Term Goals: Week 2:  PT Short Term Goal 1 (Week 2): Pt will perform stand pivot transfers with SBA + LRAD PT Short Term Goal 2 (Week 2): Pt will ambulate over uneven ground with CGA and LRAD PT Short Term Goal 3 (Week 2): Pt will initiate car transfer training PT Short Term Goal 4 (Week 2): Pt will ambulate 4 steps with BHR per home set up with minA  Skilled Therapeutic Interventions/Progress Updates: Pt presented in bed with NT and wife present agreeable to therapy. Pt c/o B shoulder pain, unrated with rest and repositioning provided as needed. Pt pulled self up to long sit to allow for abdominal binder to be placed. Pt returned to supine and PTA fastened binder. Pt then completed supine to sit with supervision and increased effort. PTA threaded LLE for time management and pt was able to thread RLE. Pt then stood without AD and supervision and pulled pants over hips with CGA. Pt returned to sitting and PTA donned shoes with pt able to tie laces with supervision. Pt indicated lightheadedness range between 2-3 with activity which resolved to 1 with rest. Pt then stood with supervision and ambulated to main gym with CGA. Pt sat at mat and while waiting for stair access performed LAQ and seated hip flexion to fatigue. Once stairs avail pt ambulated and ascended/descended 2 x 16 using 1 rail only (R rail ascending L rail descending). Pt initially descended with step to pattern then progressed to step through pattern with pt maintaining good safety. Pt did require a seated rest break between bouts due to increased lightheadedness and fatigue. Pt then participated in standing balance/tolerate activity by use of rebounder. Pt was able to perform 2 x 25 in standing without AD however required  seated rest break between bouts due to increased dizziness. Pt was also able to perform activity while performing trunk rotation L/R. Pt also participated in obstacle course incorporating hurdles, compliant surface, and cobble foam blocks. Pt noted to demonstrate good ankle strategy and with increased effort was able to manage RW (picking up and placing on mat). Pt was able to complete with CGA overall. Pt also participated in stepping strategies including toe taps to target without AD both forward and laterally. Pt with x 1 episode of LOB due to R ankle eversion (was stepping with LLE). Pt was able to improve with repetition. Pt did indicate increased lightheadedness after this activity with pt indicating 5/10, BP checked 116/71 (85) HR 63. After several minutes symptoms subsided and pt agreeable to continue. Pt then ambulated to day room with CGA and RW and participated in Runner, broadcasting/film/video. Pt initially seated at [facility] and pedaled at setting 30cm/sec x 2 min. Pt then performed Sit to stand 2 x 5 with mirror feedback to maintain equal weight bearing through BLE. Pt then progressed to standing march x 25 cycles at 30cm/sec for increased intensity. Pt with mild light headedness after second round of activity. Pt then ambulated back to room and in room requested to use toilet for BM. Completed ambulatory transfer to toilet and completed clothing management with CGA. Pt left seated at toilet verbalizing understanding to pull call light once completed with nsg staff and wife notified of pt's disposition.      Therapy Documentation Precautions:  Precautions  Precautions: Fall, Cervical Precaution/Restrictions Comments: Foley Required Braces or Orthoses: Cervical Brace Cervical Brace: Hard collar, Soft collar Restrictions Weight Bearing Restrictions Per Provider Order: No General:   Vital Signs: Therapy Vitals Temp: 97.9 F (36.6 C) Temp Source: Oral Pulse Rate: 68 Resp: 16 BP: 132/71 Patient  Position (if appropriate): Lying Oxygen Therapy SpO2: 95 % O2 Device: Room Air    Therapy/Group: Individual Therapy  Jamison Yuhasz 10/01/2023, 4:05 PM

## 2023-10-01 NOTE — Progress Notes (Addendum)
 Speech Language Pathology Daily Session Note  Patient Details  Name: Brandon Watts MRN: [id number] Date of Birth: 05/29/1946  Today's Date: 10/01/2023 SLP Individual Time: 1415-1500 SLP Individual Time Calculation (min): 45 min  Short Term Goals: Week 2: SLP Short Term Goal 1 (Week 2): Pt will complete oropharyngeal exercises w/ s visual/verbal cues SLP Short Term Goal 2 (Week 2): Pt will utilize safe swallow strategies during PO intake with s cues SLP Short Term Goal 3 (Week 2): Pt will present w/ overt s/s of airway invasion 75% of the time or less during PO intake  Skilled Therapeutic Interventions:   Pt and his wife were greeted at bedside. He reported bladder spasms this afternoon. Despite this, he demonstrated adequate reasoning and problem solving during conversation w/ nursing re catheter options. SLP facilitated PO trials with regular textures. Throat clear x2 noted overall, which is significantly improved from prev PO trials. Discussed diet upgrade with him. He demonstrates an overall safe and effective oropharyngeal swallow to tolerate regular textures/thin liquids. However, he requested to stay on Dys 3 diet at this time d/t precut bites/meats. After PO trials, he completed a specific word finding task targeting information processing, attention, and memory @ modI. Pt's family arrived upon SLP departure and he was left in bed with his call light within reach. Recommend cont ST per POC.   Pain  Bladder spasms - 8/10 pain. He declined any additional pain medication at this time.   Therapy/Group: Individual Therapy  [name] 10/01/2023, 2:29 PM

## 2023-10-01 NOTE — Progress Notes (Signed)
 Occupational Therapy Weekly Progress Note  Patient Details  Name: Brandon Watts MRN: [id number] Date of Birth: 10-12-1945  Beginning of progress report period: September 24, 2023 End of progress report period: October 01, 2023  Patient has met 4 of 4 short term goals.  Pt made steady progress with BADLs, standing balance, and funcitonal transfers during the past week. Pt continues to experience OH with transitional movements and currently requires abdominal binder, thigh high Ted hose, and BLE Ace wraps prior to getting OOB. Pt performs bathing tasks at shower level with mod A, UB dressing seated EOB with mod A, and max A for LB dressing with sit<>stand from EOB. Sit<>stand and standing balance with CGA. A Family Conference was conducted on 3/20 with wife and pt present. Pt's wife has been present during therapy sessions. Pt using LUE for gross assist during BADLs.   Patient continues to demonstrate the following deficits: muscle weakness and muscle joint tightness, decreased cardiorespiratoy endurance, impaired timing and sequencing, unbalanced muscle activation, and decreased coordination, decreased safety awareness, and decreased standing balance, decreased postural control, and decreased balance strategies and therefore will continue to benefit from skilled OT intervention to enhance overall performance with BADL and Reduce care partner burden.  Patient progressing toward long term goals..  Continue plan of care.  OT Short Term Goals Week 2:  OT Short Term Goal 1 (Week 2): Pt will perform LB bathing with Mod A (x1) + LRAD. OT Short Term Goal 1 - Progress (Week 2): Met OT Short Term Goal 2 (Week 2): Pt will perform LB dressing with Mod A (x1) + LRAD. OT Short Term Goal 2 - Progress (Week 2): Met OT Short Term Goal 3 (Week 2): Pt will complete toileting tasks with mod A OT Short Term Goal 3 - Progress (Week 2): Met OT Short Term Goal 4 (Week 2): Pt will perform UB dressing tasks with mod A OT  Short Term Goal 4 - Progress (Week 2): Met Week 3:  OT Short Term Goal 1 (Week 3): STG=LTG 2/2 ELOS  Prepared by: [name] 10/01/2023, 9:39 AM   Signed by: Lou Cal, OTR/L, MSOT 10/02/23

## 2023-10-01 NOTE — Plan of Care (Signed)
 Bladder elimination being managed with bladder scans and intermittent catheterization. Patient understands the need for catheterization and the importance of expressing pain needs. Patient having regular bowel movements. SCI education progressing.

## 2023-10-01 NOTE — Plan of Care (Signed)
  Problem: Consults Goal: RH SPINAL CORD INJURY PATIENT EDUCATION Description:  See Patient Education module for education specifics.  Outcome: Progressing   Problem: SCI BOWEL ELIMINATION Goal: RH STG SCI MANAGE BOWEL WITH MEDICATION WITH ASSISTANCE Description: STG SCI Manage bowel with medication with mod I assistance. Outcome: Progressing   Problem: SCI BLADDER ELIMINATION Goal: RH STG MANAGE BLADDER WITH ASSISTANCE Description: STG Manage Bladder With mod I Assistance Outcome: Progressing   Problem: RH PAIN MANAGEMENT Goal: RH STG PAIN MANAGED AT OR BELOW PT'S PAIN GOAL Description: < 4 with prns Outcome: Progressing   Problem: RH KNOWLEDGE DEFICIT SCI Goal: RH STG INCREASE KNOWLEDGE OF SELF CARE AFTER SCI Description: Patient and spouse will be able to mange care using educational resources for medication, spinal cord injury, dietary modifications and skin care independently 10/01/2023 0355 by Larina Bras, [place], Student-RN Outcome: Progressing 10/01/2023 0347 by Larina Bras [place], Student-RN Outcome: Progressing   Problem: RH BOWEL ELIMINATION Goal: RH STG MANAGE BOWEL WITH ASSISTANCE Description: STG Manage Bowel with Assistance. Outcome: Progressing Goal: RH STG MANAGE BOWEL W/MEDICATION W/ASSISTANCE Description: STG Manage Bowel with Medication with Assistance. Outcome: Progressing   Problem: RH BLADDER ELIMINATION Goal: RH STG MANAGE BLADDER WITH ASSISTANCE Description: STG Manage Bladder With Assistance Outcome: Progressing   Problem: RH SKIN INTEGRITY Goal: RH STG SKIN FREE OF INFECTION/BREAKDOWN Outcome: Progressing

## 2023-10-01 NOTE — Progress Notes (Signed)
 Patient was bladder scanned at [phone] with of urine present. Patient refused catheter due to discomfort.

## 2023-10-02 DIAGNOSIS — I951 Orthostatic hypotension: Secondary | ICD-10-CM

## 2023-10-02 DIAGNOSIS — S43002D Unspecified subluxation of left shoulder joint, subsequent encounter: Secondary | ICD-10-CM

## 2023-10-02 NOTE — Plan of Care (Signed)
  Problem: SCI BOWEL ELIMINATION Goal: RH STG MANAGE BOWEL WITH ASSISTANCE Description: STG Manage Bowel with mod I Assistance. Outcome: Progressing   Problem: RH SAFETY Goal: RH STG ADHERE TO SAFETY PRECAUTIONS W/ASSISTANCE/DEVICE Description: STG Adhere to Safety Precautions With cues Assistance/Device. Outcome: Progressing   Problem: RH KNOWLEDGE DEFICIT SCI Goal: RH STG INCREASE KNOWLEDGE OF SELF CARE AFTER SCI Description: Patient and spouse will be able to mange care using educational resources for medication, spinal cord injury, dietary modifications and skin care independently Outcome: Progressing

## 2023-10-02 NOTE — Progress Notes (Addendum)
 Occupational Therapy Session Note  Patient Details  Name: CLAUDIUS MICH MRN: [id number] Date of Birth: [birth year]  Today's Date: 10/02/2023 OT Individual Time: 9563-8756 OT Individual Time Calculation (min): 30 min  and Today's Date: 10/02/2023 OT Missed Time: 10 Minutes Missed Time Reason: Other (comment) (Symptomatic OH)   Short Term Goals: Week 3:  OT Short Term Goal 1 (Week 3): STG=LTG 2/2 ELOS  Skilled Therapeutic Interventions/Progress Updates:   Pt received sitting EOB, spouse and daughter present. Spouse requesting to be "checked off" in order to assist patient with hallway level ambulation. Education provided on rehab unit rules/policy for mobility outside of therapy sessions. Both receptive to spouse being "checked off" for room level transfers on account of stable BP readings. See below for readings throughout session:  Seated EOB: BP=103/63  1st STS: BP=64/47 2nd STS: BP=72/36 3rd STS: BP=52/27  Pt symptomatic throughout trials, extended rest-breaks provided in between trials of standing of ~90 sec-2 mins. RN made aware of the above even with TEDS/Binder/ACE wraps. Readings also gathered from BUEs. Pt returns to supine with supervision, missing ~10 mins of skilled intervention due to [facility]. Spouse inquiring about when patient would be ready to get out and "at least sit in [facility]." OT advising spouse to let patient rest and re-evaluate BP within 20-30 mins.   Therapy Documentation Precautions:  Precautions Precautions: Fall, Cervical Precaution/Restrictions Comments: Foley Required Braces or Orthoses: Cervical Brace Cervical Brace: Hard collar, Soft collar Restrictions Weight Bearing Restrictions Per Provider Order: No   Therapy/Group: Individual Therapy  Lou Cal, OTR/L, MSOT  10/02/2023, 6:17 AM

## 2023-10-02 NOTE — Plan of Care (Signed)
  Problem: Consults Goal: RH SPINAL CORD INJURY PATIENT EDUCATION Description:  See Patient Education module for education specifics.  Outcome: Progressing   Problem: SCI BOWEL ELIMINATION Goal: RH STG MANAGE BOWEL WITH ASSISTANCE Description: STG Manage Bowel with mod I Assistance. Outcome: Progressing Goal: RH STG SCI MANAGE BOWEL WITH MEDICATION WITH ASSISTANCE Description: STG SCI Manage bowel with medication with mod I assistance. Outcome: Progressing   Problem: SCI BLADDER ELIMINATION Goal: RH STG MANAGE BLADDER WITH ASSISTANCE Description: STG Manage Bladder With mod I Assistance Outcome: Progressing Goal: RH STG MANAGE BLADDER WITH MEDICATION WITH ASSISTANCE Description: STG Manage Bladder With Medication With mod I Assistance. Outcome: Progressing   Problem: RH SAFETY Goal: RH STG ADHERE TO SAFETY PRECAUTIONS W/ASSISTANCE/DEVICE Description: STG Adhere to Safety Precautions With cues Assistance/Device. Outcome: Progressing   Problem: RH PAIN MANAGEMENT Goal: RH STG PAIN MANAGED AT OR BELOW PT'S PAIN GOAL Description: < 4 with prns Outcome: Progressing   Problem: RH KNOWLEDGE DEFICIT SCI Goal: RH STG INCREASE KNOWLEDGE OF SELF CARE AFTER SCI Description: Patient and spouse will be able to mange care using educational resources for medication, spinal cord injury, dietary modifications and skin care independently Outcome: Progressing   Problem: RH BOWEL ELIMINATION Goal: RH STG MANAGE BOWEL WITH ASSISTANCE Description: STG Manage Bowel with Assistance. Outcome: Progressing Goal: RH STG MANAGE BOWEL W/MEDICATION W/ASSISTANCE Description: STG Manage Bowel with Medication with Assistance. Outcome: Progressing   Problem: RH BLADDER ELIMINATION Goal: RH STG MANAGE BLADDER WITH ASSISTANCE Description: STG Manage Bladder With Assistance Outcome: Progressing   Problem: RH SKIN INTEGRITY Goal: RH STG SKIN FREE OF INFECTION/BREAKDOWN Outcome: Progressing

## 2023-10-02 NOTE — Progress Notes (Signed)
 PROGRESS NOTE   Subjective/Complaints:  Some sense of relief with foley in. Was startled yesterday when he saw blood around penis/inguinal areas. Feels comfortable this morning. Asked if he could get up and walk with his wife since he has limited therapy this weekend.   ROS: Patient denies fever, rash, sore throat, blurred vision, dizziness, nausea, vomiting, diarrhea, cough, shortness of breath or chest pain,   headache, or mood change.   Objective:   No results found.   Recent Labs    09/30/23 0611  WBC 8.3  HGB 13.4  HCT 40.2  PLT 267   Recent Labs    09/30/23 0611  NA 140  K 4.1  CL 106  CO2 28  GLUCOSE 97  BUN 17  CREATININE 0.91  CALCIUM 10.9*    Intake/Output Summary (Last 24 hours) at 10/02/2023 0804 Last data filed at 10/01/2023 2140 Gross per 24 hour  Intake 240 ml  Output 900 ml  Net -660 ml        Physical Exam: Vital Signs Blood pressure 124/76, pulse (!) 59, temperature (!) 97.4 F (36.3 C), resp. rate 18, height 5\' 8"  (1.727 m), weight 69.2 kg, SpO2 99%.  Constitutional: No distress . Vital signs reviewed. HEENT: NCAT, EOMI, oral membranes moist Neck: supple Cardiovascular: RRR without murmur. No JVD    Respiratory/Chest: CTA Bilaterally without wheezes or rales. Normal effort    GI/Abdomen: BS +, non-tender, non-distended Ext: no clubbing, cyanosis, or edema Psych: pleasant and cooperative      3 beats clonus in LE's- no increased tone seen MSK: Very TTP with hyperalgesia over L deltoid and shoulder laterally- NO change today. 1/2" sublux of shoulder--tender with PROM RUE- Biceps 4-/5; WE 4/5; Triceps 4-/5; Grip 3+ to 4-/5, FA 3+/5 LUE_ Biceps 2+/5; WE 3+/5; Triceps 2+/5; Grip 3/5; and FA 3/5 less  TTP over R anterior shoulder with now no pain with resisted flexion- t  Skin: C/D/I. No apparent lesions.IV intact    Neurologic exam:  Cognition: AAO x3. More focused, on point today  compared to my last visit with him  Language: Fluent, Mild dysarthria.   Insight: Fair insight into current condition.    Sensation: To light touch hypersensitive in LUE at shoulder  Reflexes :Negative Hoffman's and babinski signs bilaterally.  CN: 2-12 grossly intact.  Coordination: No apparent tremors. Weakness limits FNF on left ; + Mild LLE HTS Spasticity: MAS 0 in all extremities. Flaccid LUE.       Assessment/Plan: 1. Functional deficits which require 3+ hours per day of interdisciplinary therapy in a comprehensive inpatient rehab setting. Physiatrist is providing close team supervision and 24 hour management of active medical problems listed below. Physiatrist and rehab team continue to assess barriers to discharge/monitor patient progress toward functional and medical goals  Care Tool:  Bathing    Body parts bathed by patient: Left arm, Chest, Abdomen, Front perineal area, Buttocks, Right upper leg, Left upper leg, Face   Body parts bathed by helper: Buttocks, Right lower leg, Left lower leg, Right arm, Left arm, Front perineal area, Right upper leg, Left upper leg, Face Body parts n/a: Right lower leg, Left lower leg  Bathing assist Assist Level: Moderate Assistance - Patient 50 - 74%     Upper Body Dressing/Undressing Upper body dressing   What is the patient wearing?: Pull over shirt    Upper body assist Assist Level: Moderate Assistance - Patient 50 - 74%    Lower Body Dressing/Undressing Lower body dressing      What is the patient wearing?: Pants     Lower body assist Assist for lower body dressing: Moderate Assistance - Patient 50 - 74%     Toileting Toileting    Toileting assist Assist for toileting: Total Assistance - Patient < 25%     Transfers Chair/bed transfer  Transfers assist     Chair/bed transfer assist level: Contact Guard/Touching assist     Locomotion Ambulation   Ambulation assist   Ambulation activity did not occur:  Safety/medical concerns (2/2 endurance deficit/ limited activity tolerance)  Assist level: Contact Guard/Touching assist Assistive device: Walker-rolling Max distance: 150   Walk 10 feet activity   Assist  Walk 10 feet activity did not occur: Safety/medical concerns (2/2 endurance deficit/ limited activity tolerance)  Assist level: Contact Guard/Touching assist Assistive device: Walker-rolling   Walk 50 feet activity   Assist Walk 50 feet with 2 turns activity did not occur: Safety/medical concerns (2/2 endurance deficit/ limited activity tolerance)  Assist level: Minimal Assistance - Patient > 75% Assistive device: Walker-rolling    Walk 150 feet activity   Assist Walk 150 feet activity did not occur: Safety/medical concerns (2/2 endurance deficit/ limited activity tolerance)  Assist level: Contact Guard/Touching assist Assistive device: Walker-rolling    Walk 10 feet on uneven surface  activity   Assist Walk 10 feet on uneven surfaces activity did not occur: Safety/medical concerns (d/t limited activity tolerance and imbalance)         Wheelchair     Assist Is the patient using a wheelchair?: Yes Type of Wheelchair: Manual Wheelchair activity did not occur: Safety/medical concerns (2/2 endurance deficit/ limited activity tolerance)  Wheelchair assist level: Dependent - Patient 0%      Wheelchair 50 feet with 2 turns activity    Assist    Wheelchair 50 feet with 2 turns activity did not occur: Safety/medical concerns (2/2 endurance deficit/ limited activity tolerance)       Wheelchair 150 feet activity     Assist  Wheelchair 150 feet activity did not occur: Safety/medical concerns (2/2 endurance deficit/ limited activity tolerance)       Blood pressure 124/76, pulse (!) 59, temperature (!) 97.4 F (36.3 C), resp. rate 18, height 5\' 8"  (1.727 m), weight 69.2 kg, SpO2 99%.   Medical Problem List and Plan: 1. Functional deficits due to  SCI (central cord syndrome)- C4 ASIA C/D (incomplete quadriplegia)  with C4 fracture s/p arthrodesis C3-4 anterior interbody technique discectomy for decompression of spinal cord and exiting nerve roots with foraminotomies as well as additional level C4-5 interbody technique for decompression placement of anterior instrumentation consisting of interbody plate and screws C3-4-5 09/09/2023.  Cervical collar at all times             -patient may  shower             -ELOS/Goals: 18 to 21 days, min assist PT/OT/SLP                            -On admission Dr. Maisie Fus cleared patient for soft collar when in bed for comfort; ortho tech order  placed for soft collar and nursing order for soft collar in bed, alternating to hard collar when OOB   - will allow dog to come in from home with papers  - not ready for grounds pass quite yet  Started estim D/c 3/31 -Continue CIR therapies including PT, OT, and SLP. Pt would like to walk with wife. Reached out to PT about potentially signing off on therapeutic walking with wife.  2.  Antithrombotics: -DVT/anticoagulation/segmental LUL PE:  Pharmaceutical: Lovenox initiated 09/11/2023 and titrated.  Neurosurgery Dr. Maisie Fus stating can switch to DOAC 3/7, since last hemoglobin 3/5 will wait until a.m. labs and switch if stable 3/8- pt refused labs this AM- will wait til Monday when have labs 3/12- Switched to Eliquis 5 mg BID from Tx dose lovenox  3/20- will need for 3 months total- went over that increased risk of DVT/PE is for a total of 3 months for since has 2 PE's             -antiplatelet therapy: N/A   3. Pain Management: OxyContin sustained release 10 mg every 12 hours, Neurontin 300 mg 3 times daily Robaxin as needed.  Will add tramadol as needed   3/8- pt reports pain usually controlled- con't regimen  3/10--still having a lot of LUE--increase gabapentin to 400mg  tid  3/11- change Tylenol to 1000 mg q8 hours per pt request  3/12- has been helpful to add  tylenol  3/17- has tramadol- not really using much- last used 3/14  3/18- 3/19 still has hyperalgesia- of LUE but we discussed using other nerve pain meds- but wait since they will cause urinary retention- doing desensitization techniques  -also refused MRI of L shoulder since "hates MRI machines"   3/20- will add Baclofen 5 mg TID prn for spasms and stop Robaxin  3/22 baclofen seems effective for spasms 4. Mood/Behavior/Sleep/Hallucinations: Melatonin 5 mg nightly.  Xanax Dced on admission per family d/t confusion.              - antipsychotic agents: Seroquel 12.5-25 mg PRN              - Delirium precautions with stimulation during the daytime, quiet with low lights an minimal interruptions at nighttime, limit lines/tubes/drains to essential access only, limit physical and chemical restraints               - Melatonin 5 mg at bedtime, trazodone 25-50 mg PRN.  - May benefit from standing at bedtime seroquel if trazodone ineffective.    3/8- slept OK- will con't regimen for now  3/10 continues to struggle with sleep, partially due to pain    -gabapentin increased above   -will schedule trazodone @2100  with back up dose prn       -pt voiced some feeling of depression over his accident/ condition -he is interested in speaking with neuropsychology. -will ask Dr. Kieth Brightly to see patient regarding reactive depression. 3/140- added Seroquel yesterday for psychomotor agitation- will increase to 25 mg at bedtime tonight  3/17- sleeping great- feels more confused/delayed this AM- don't see a medicine that could cause this 3/18- off Trazodone due to urinary issues- admits very sad- but doesn't want to add meds! 3/19- Pt refuses SSRI for mood 5. Neuropsych/cognition/small SDH: This patient is not completely capable of making decisions on his own behalf.              - telesitter continued --renewed daily    3/20- got off Telesitter 6. Skin/Wound Care: Routine  skin checks 7.  Fluids/Electrolytes/Nutrition/hyponatremia: Routine in and outs with follow-up chemistries    Latest Ref Rng & Units 09/30/2023    6:11 AM 09/27/2023    5:40 AM 09/25/2023    5:27 AM  BMP  Glucose 70 - 99 mg/dL 97  960  454   BUN 8 - 23 mg/dL 17  28  24    Creatinine 0.61 - 1.24 mg/dL 0.98  1.19  1.47   Sodium 135 - 145 mmol/L 140  136  139   Potassium 3.5 - 5.1 mmol/L 4.1  4.3  4.6   Chloride 98 - 111 mmol/L 106  104  102   CO2 22 - 32 mmol/L 28  28  29    Calcium 8.9 - 10.3 mg/dL 82.9  56.2  13.0     8.  Dysphagia. Cortrak placed 3/5.   Currently on a mechanical soft diet with MBS pending Advance diet as tolerated per speech therapy              - On TF with 50 ml/hr osmolite 1.5 + prosource 60 ml daily              - Dietary consulted for management   3/8- will add IVFs, pt insistent- for dehydration- refused labs today so cannot see Cr/BUN to assess- but says not drinking enough  3/10--changed ivf to free water thru tube--encourage PO as well  **update 4:30pm--pt cleared for D2/thins per SLP!   -RD transitioning to nocturnal feeds beginning at 0500 tomorrow  3/11- fed self breakfast this Am and dinner last night for first time  3/12- con't cortrak  3/13- Got OK by Dietician to remove Cortrak and is on D3 thin diet, so no need for steroids- and would probably do better if remove Cortrak- will remove today and monitor  3/14- doing ok without Cortrak  3/18- Is dry- BUN 28- will have pt push fluids  3/22 continue to push fluids, labs again monday 9.  Hypertension with Orthostatic hypotension.  Cozaar 50 mg daily.  Monitor with increased mobility.              - Holding home hydrochlorothiazide    3/8- BP 110s- a little soft- monitor for Orthostatic hypotension  3/11- will decrease Cozaar to 25 mg daily and add Midodrine 2.5 mg TID with meals- to start at 6:30am since almost passed out 2x per PT.   3/12- BP dropped to 58 systolic yesterday-added Midodrine- BP is slightly elevated in  supine, but dropping dramatically in spite of TEDs and ACE wraps   3/13- Increased Midodrine to 5 mg TID- will con't Abd binder, TEDs and ACE wraps.   3/14- BP did better with OT today lowest low 100s systolic, with abd binder, TED's, ACE wraps and midodrine Vitals:   10/01/23 1937 10/02/23 0510  BP: 130/81 124/76  Pulse: 65 (!) 59  Resp: 18 18  Temp: 98.4 F (36.9 C) (!) 97.4 F (36.3 C)  SpO2: 96% 99%  Patient still on losartan will discontinue. Have also reduced midodrine dose. Off scopolamine which can also lower blood pressure  3/17- BP still soft supine in bed- have ordered Orthostatics by nursing- and will see if need to increase Midodrine again, due to soft BP with laying down  3/18- will increase Midodrine to 5 mg TID since BP dropped to 70's yesterday and almost passed out. 3/19- BP lying down 140s yesterday but better this AM-  3/20- Pt and I agree a little Permissive elevated  BP supine is better than dropping BP in therapy- will increase Midodrine ot compensate for Low BP form Flomax starting.   3/22 bp's sitting and lying were equal yesterday 10.  BPH with likely neurogenic bladder.  Proscar 5 mg nightly.  Check PVR             - Has coude catheter placed 2/27, patient hesitant to remove, OK to keep tonight and would re-address in AM - states he needs to be able to stand to urinate d/t BPH   3/8- explained can wait 2 days, but then have to remove foley-   3/10---remove foley--voiding trial  3/12- Requiring caths- no real voiding so far - will add Urecholine, because OH makes it difficult to add Flomax. Add 5 mg TID for now- monitor  3/13- will wait on Flomax- until BP doing better- bladder scans and caths up to 1000cc- needs to do every 4 hours based on cath results-d/w nursing 3/14- wrote order for coude' caths only and lidocaine jelly with caths  Midodrine is an alpha-blocker which also may be affecting bladder outflow, will start weaning today.  Scopolamine has  anticholinergic effects which can also reduce bladder emptying and has been discontinued.  Certainly his urinary issues may be related to his spinal cord injury but will try to minimize pharmaceutical effects of his meds 3/17- they placed Foley this weekend due to trauma- will keep 3-4 days then d/c.   3/18- will d/c foley Thursday AM since placed Sunday night-for trauma- pt said "happy for break from cathing"  3/19- will remove Foley in AM/Thursday since don't want pt getting UTI 3/20- will d/c Foley today- wil add Flomax and increase Midodrine to help combat the [facility] will get from Flomax.  3/22 foley replaced d/t retention/hematuria--primary team can reassess this coming week.   -urine clear/yellow -continue flomax 11.  Constipation with likely neurogenic bowel.   No BM since admission               - Miralax 17 g BID, sennakot S 2 tabs BID              - PRN sorbitol for moderate constipation, PRN fleet enema for severe constipation   3/8- will give 45 cc Sorbitol and if no results, Soap suds enema this evening  3/10- bm's continent yesterday--type 6--no changes to regimen today  3/11- Strained but not able to go this AM- will montior  3/12- LBM yesterday per pt- but 3/9 per chart- will check with therapy- if no results, will need more intervention- already on Miralax BID, Senna 2 tabs BID  3/13- LBM yesterday  3/14- LBM 2 days ago  3/18- LBM 2 days ago  3/19- LBM  this AM  3/21- LBM yesterday  12.  Acute respiratory failure with hypoxia/bilateral pneumonia.  Continue cefepime x 7 days.  Pulmonary services have signed off.              - Ongoing significant secretions with frequent suctioning              - Robitussin 5 mg Q6H             - Biotene PRN   3/8- running Cefepime now- con't 7 days total  3/9- CXR looks good- only mild L sided atelectasis - added Scopolamine due to increased secretions for a few days only  3/12- changed Duonebs back to QID from prn  3/13- improved secretions  with Scopolamine - this may be contributing  to retention no issues with secretions consider d/c  3/17- Scopolamine stopped  3/20- still using O2 at night- 2L- want sot have at home- knows insurance won't cover it- but wants for airhunger- still using ICS regularly.   13. L shoulder weakness and pain and new R shoulder pain.  No fracture on xray 2/26.              - Likely due to L C4 myelopathy; however no accompanying sensory deficit and no other weakness. I suspect a radicular component also  - Monitor with therapies; would consider MRI shoulder for rotator cuff pathology if no improvement   3/13- Pt's main pain is anterior and upper trap on L side- will con't to monitor- doesn't want steroid injections at this time- R shoulder pain started overnight from pulling in bed- appears to be biceps tendinitis- wants to wait on steroids due to concern with hallucinations  3/14- ended up doing steroid injection with 20mg  of kenalog- pain doing better  3/19- pt refused L shoulder MRI  3/21- pt was discussing a magnesium spray- which is fine to try for L shoulder,  Trap pain with substitution for delt weakness, add sportscream  -3/22 discussed left shoulder subluxation--encouraged him to keep arm elevated on pillows to prevent worsening 14. Restlessness/psychomotor agitation  3/13- will try Seroquel 12.5 mg at bedtime- d/w TBI MD and she agreed it's better than Depakote.   3/14- increasing seroquel to 25 mg QHS  Sleeping ok, now on seroquel , d/c trazodone, anticholinergic effect  15. 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      LOS: 15 days A FACE TO FACE EVALUATION WAS PERFORMED  Ranelle Oyster 10/02/2023, 8:04 AM

## 2023-10-02 NOTE — Progress Notes (Signed)
 Ambulated with pt 361ft in halls, pt asymptomatic. Tolerated well.

## 2023-10-02 NOTE — Progress Notes (Signed)
 Pt wife stated "Im taking him to an appointment in [facility]"  Educated pt and staff on safety. Wife stated "I don't care what the policies are, he will be fine" Went and rounded and pt was not in room.

## 2023-10-03 ENCOUNTER — Encounter: Payer: Self-pay | Admitting: Internal Medicine

## 2023-10-03 MED ORDER — ACETAMINOPHEN 500 MG PO TABS
1000.0000 mg | ORAL_TABLET | Freq: Three times a day (TID) | ORAL | Status: DC
  Administered 2023-10-03 – 2023-10-11 (×23): 1000 mg via ORAL
  Filled 2023-10-03 (×23): qty 2

## 2023-10-03 MED ORDER — ACETAMINOPHEN 500 MG PO TABS: 1000.0000 mg | ORAL_TABLET | Freq: Three times a day (TID) | ORAL | Status: DC

## 2023-10-03 MED ORDER — ACETAMINOPHEN 650 MG RE SUPP: 650.0000 mg | Freq: Three times a day (TID) | RECTAL | Status: DC

## 2023-10-03 MED ORDER — CHLORHEXIDINE GLUCONATE CLOTH 2 % EX PADS
6.0000 | MEDICATED_PAD | Freq: Two times a day (BID) | CUTANEOUS | Status: DC
  Administered 2023-10-03 – 2023-10-10 (×15): 6 via TOPICAL

## 2023-10-03 MED ORDER — ACETAMINOPHEN 650 MG RE SUPP
650.0000 mg | Freq: Three times a day (TID) | RECTAL | Status: DC
  Filled 2023-10-03 (×3): qty 1

## 2023-10-03 NOTE — Progress Notes (Signed)
 Ambulated with assistance in hallway today. No issues.   Tilden Dome, LPN

## 2023-10-03 NOTE — Progress Notes (Signed)
 PROGRESS NOTE   Subjective/Complaints:  Pt was up early and then again later in the day without any issues with bp/dizziness. Between those two activities, he experienced light-headedness and low bp's (70's-50's/40's-20's).  He is wearing TEDS/Binder/ACE, etc. Might have been 2-3 hours since last midodrine dose when it occurred.  ROS: Patient denies fever, rash, sore throat, blurred vision,   nausea, vomiting, diarrhea, cough, shortness of breath or chest pain,   headache, or mood change.   Objective:   No results found.   No results for input(s): "WBC", "HGB", "HCT", "PLT" in the last 72 hours.  No results for input(s): "NA", "K", "CL", "CO2", "GLUCOSE", "BUN", "CREATININE", "CALCIUM" in the last 72 hours.   Intake/Output Summary (Last 24 hours) at 10/03/2023 0856 Last data filed at 10/03/2023 0600 Gross per 24 hour  Intake --  Output 3050 ml  Net -3050 ml        Physical Exam: Vital Signs Blood pressure (!) 141/67, pulse 65, temperature 97.6 F (36.4 C), temperature source Oral, resp. rate 18, height 5\' 8"  (1.727 m), weight 69.2 kg, SpO2 99%.  Constitutional: No distress . Vital signs reviewed. HEENT: NCAT, EOMI, oral membranes moist Neck: supple Cardiovascular: RRR without murmur. No JVD    Respiratory/Chest: CTA Bilaterally without wheezes or rales. Normal effort    GI/Abdomen: BS +, non-tender, non-distended Ext: no clubbing, cyanosis, or edema Psych: pleasant and cooperative   3 beats clonus in LE's- no increased tone seen MSK: Very TTP with hyperalgesia over L deltoid and shoulder laterally- NO change today. 1/2" sublux of shoulder--tender with PROM RUE- Biceps 4-/5; WE 4/5; Triceps 4-/5; Grip 3+ to 4-/5, FA 3+/5 LUE_ Biceps 2+/5; WE 3+/5; Triceps 2+/5; Grip 3/5; and FA 3/5 less  TTP over R anterior shoulder with now no pain with resisted flexion- t  Skin: C/D/I. No apparent lesions.IV intact    Neurologic  exam:  Cognition: AAO x3. More focused, on point today compared to my last visit with him  Language: Fluent, Mild dysarthria.   Insight: Fair insight into current condition.    Sensation: To light touch hypersensitive in LUE at shoulder  Reflexes :Negative Hoffman's and babinski signs bilaterally.  CN: 2-12 grossly intact.  Coordination: No apparent tremors. Weakness limits FNF on left ; + Mild LLE HTS Spasticity: MAS 0 in all extremities. Flaccid LUE. C/W today's exam 10/03/2023.       Assessment/Plan: 1. Functional deficits which require 3+ hours per day of interdisciplinary therapy in a comprehensive inpatient rehab setting. Physiatrist is providing close team supervision and 24 hour management of active medical problems listed below. Physiatrist and rehab team continue to assess barriers to discharge/monitor patient progress toward functional and medical goals  Care Tool:  Bathing    Body parts bathed by patient: Left arm, Chest, Abdomen, Front perineal area, Buttocks, Right upper leg, Left upper leg, Face   Body parts bathed by helper: Buttocks, Right lower leg, Left lower leg, Right arm, Left arm, Front perineal area, Right upper leg, Left upper leg, Face Body parts n/a: Right lower leg, Left lower leg   Bathing assist Assist Level: Moderate Assistance - Patient 50 - 74%  Upper Body Dressing/Undressing Upper body dressing   What is the patient wearing?: Pull over shirt    Upper body assist Assist Level: Moderate Assistance - Patient 50 - 74%    Lower Body Dressing/Undressing Lower body dressing      What is the patient wearing?: Pants     Lower body assist Assist for lower body dressing: Moderate Assistance - Patient 50 - 74%     Toileting Toileting    Toileting assist Assist for toileting: Total Assistance - Patient < 25%     Transfers Chair/bed transfer  Transfers assist     Chair/bed transfer assist level: Contact Guard/Touching assist      Locomotion Ambulation   Ambulation assist   Ambulation activity did not occur: Safety/medical concerns (2/2 endurance deficit/ limited activity tolerance)  Assist level: Contact Guard/Touching assist Assistive device: Walker-rolling Max distance: 150   Walk 10 feet activity   Assist  Walk 10 feet activity did not occur: Safety/medical concerns (2/2 endurance deficit/ limited activity tolerance)  Assist level: Contact Guard/Touching assist Assistive device: Walker-rolling   Walk 50 feet activity   Assist Walk 50 feet with 2 turns activity did not occur: Safety/medical concerns (2/2 endurance deficit/ limited activity tolerance)  Assist level: Minimal Assistance - Patient > 75% Assistive device: Walker-rolling    Walk 150 feet activity   Assist Walk 150 feet activity did not occur: Safety/medical concerns (2/2 endurance deficit/ limited activity tolerance)  Assist level: Contact Guard/Touching assist Assistive device: Walker-rolling    Walk 10 feet on uneven surface  activity   Assist Walk 10 feet on uneven surfaces activity did not occur: Safety/medical concerns (d/t limited activity tolerance and imbalance)         Wheelchair     Assist Is the patient using a wheelchair?: Yes Type of Wheelchair: Manual Wheelchair activity did not occur: Safety/medical concerns (2/2 endurance deficit/ limited activity tolerance)  Wheelchair assist level: Dependent - Patient 0%      Wheelchair 50 feet with 2 turns activity    Assist    Wheelchair 50 feet with 2 turns activity did not occur: Safety/medical concerns (2/2 endurance deficit/ limited activity tolerance)       Wheelchair 150 feet activity     Assist  Wheelchair 150 feet activity did not occur: Safety/medical concerns (2/2 endurance deficit/ limited activity tolerance)       Blood pressure (!) 141/67, pulse 65, temperature 97.6 F (36.4 C), temperature source Oral, resp. rate 18, height 5'  8" (1.727 m), weight 69.2 kg, SpO2 99%.   Medical Problem List and Plan: 1. Functional deficits due to SCI (central cord syndrome)- C4 ASIA C/D (incomplete quadriplegia)  with C4 fracture s/p arthrodesis C3-4 anterior interbody technique discectomy for decompression of spinal cord and exiting nerve roots with foraminotomies as well as additional level C4-5 interbody technique for decompression placement of anterior instrumentation consisting of interbody plate and screws C3-4-5 09/09/2023.  Cervical collar at all times             -patient may  shower             -ELOS/Goals: 18 to 21 days, min assist PT/OT/SLP                            -On admission Dr. Maisie Fus cleared patient for soft collar when in bed for comfort; ortho tech order placed for soft collar and nursing order for soft collar in bed, alternating  to hard collar when OOB   - will allow dog to come in from home with papers  - not ready for grounds pass quite yet  Started estim D/c 3/31 -Continue CIR therapies including PT, OT, and SLP. Will hold off on ambulation with wife until bp is more predictable. Pt understands 2.  Antithrombotics: -DVT/anticoagulation/segmental LUL PE:  Pharmaceutical: Lovenox initiated 09/11/2023 and titrated.  Neurosurgery Dr. Maisie Fus stating can switch to DOAC 3/7, since last hemoglobin 3/5 will wait until a.m. labs and switch if stable 3/8- pt refused labs this AM- will wait til Monday when have labs 3/12- Switched to Eliquis 5 mg BID from Tx dose lovenox  3/20- will need for 3 months total- went over that increased risk of DVT/PE is for a total of 3 months for since has 2 PE's             -antiplatelet therapy: N/A   3. Pain Management: OxyContin sustained release 10 mg every 12 hours, Neurontin 300 mg 3 times daily Robaxin as needed.  Will add tramadol as needed   3/8- pt reports pain usually controlled- con't regimen  3/10--still having a lot of LUE--increase gabapentin to 400mg  tid  3/11- change Tylenol  to 1000 mg q8 hours per pt request  3/12- has been helpful to add tylenol  3/17- has tramadol- not really using much- last used 3/14  3/18- 3/19 still has hyperalgesia- of LUE but we discussed using other nerve pain meds- but wait since they will cause urinary retention- doing desensitization techniques  -also refused MRI of L shoulder since "hates MRI machines"   3/20- will add Baclofen 5 mg TID prn for spasms and stop Robaxin  3/22-23 baclofen seems effective for spasms 4. Mood/Behavior/Sleep/Hallucinations: Melatonin 5 mg nightly.  Xanax Dced on admission per family d/t confusion.              - antipsychotic agents: Seroquel 12.5-25 mg PRN              - Delirium precautions with stimulation during the daytime, quiet with low lights an minimal interruptions at nighttime, limit lines/tubes/drains to essential access only, limit physical and chemical restraints               - Melatonin 5 mg at bedtime, trazodone 25-50 mg PRN.  - May benefit from standing at bedtime seroquel if trazodone ineffective.    3/8- slept OK- will con't regimen for now  3/10 continues to struggle with sleep, partially due to pain    -gabapentin increased above   -will schedule trazodone @2100  with back up dose prn       -pt voiced some feeling of depression over his accident/ condition -he is interested in speaking with neuropsychology. -will ask Dr. Kieth Brightly to see patient regarding reactive depression. 3/140- added Seroquel yesterday for psychomotor agitation- will increase to 25 mg at bedtime tonight  3/17- sleeping great- feels more confused/delayed this AM- don't see a medicine that could cause this 3/18- off Trazodone due to urinary issues- admits very sad- but doesn't want to add meds! 3/19- Pt refuses SSRI for mood 5. Neuropsych/cognition/small SDH: This patient is not completely capable of making decisions on his own behalf.              - telesitter continued --renewed daily    3/20- got off  Telesitter 6. Skin/Wound Care: Routine skin checks 7. Fluids/Electrolytes/Nutrition/hyponatremia: Routine in and outs with follow-up chemistries    Latest Ref Rng & Units  09/30/2023    6:11 AM 09/27/2023    5:40 AM 09/25/2023    5:27 AM  BMP  Glucose 70 - 99 mg/dL 97  409  811   BUN 8 - 23 mg/dL 17  28  24    Creatinine 0.61 - 1.24 mg/dL 9.14  7.82  9.56   Sodium 135 - 145 mmol/L 140  136  139   Potassium 3.5 - 5.1 mmol/L 4.1  4.3  4.6   Chloride 98 - 111 mmol/L 106  104  102   CO2 22 - 32 mmol/L 28  28  29    Calcium 8.9 - 10.3 mg/dL 21.3  08.6  57.8     8.  Dysphagia. Cortrak placed 3/5.   Currently on a mechanical soft diet with MBS pending Advance diet as tolerated per speech therapy              - On TF with 50 ml/hr osmolite 1.5 + prosource 60 ml daily              - Dietary consulted for management   3/8- will add IVFs, pt insistent- for dehydration- refused labs today so cannot see Cr/BUN to assess- but says not drinking enough  3/10--changed ivf to free water thru tube--encourage PO as well  **update 4:30pm--pt cleared for D2/thins per SLP!   -RD transitioning to nocturnal feeds beginning at 0500 tomorrow  3/11- fed self breakfast this Am and dinner last night for first time  3/12- con't cortrak  3/13- Got OK by Dietician to remove Cortrak and is on D3 thin diet, so no need for steroids- and would probably do better if remove Cortrak- will remove today and monitor  3/14- doing ok without Cortrak  3/18- Is dry- BUN 28- will have pt push fluids  3/23 pushing po, labs again monday 9.  Hypertension with Orthostatic hypotension.  Cozaar 50 mg daily.  Monitor with increased mobility.              - Holding home hydrochlorothiazide    3/8- BP 110s- a little soft- monitor for Orthostatic hypotension  3/11- will decrease Cozaar to 25 mg daily and add Midodrine 2.5 mg TID with meals- to start at 6:30am since almost passed out 2x per PT.   3/12- BP dropped to 58 systolic yesterday-added  Midodrine- BP is slightly elevated in supine, but dropping dramatically in spite of TEDs and ACE wraps   3/13- Increased Midodrine to 5 mg TID- will con't Abd binder, TEDs and ACE wraps.   3/14- BP did better with OT today lowest low 100s systolic, with abd binder, TED's, ACE wraps and midodrine Vitals:   10/02/23 1933 10/03/23 0557  BP: 114/70 (!) 141/67  Pulse: 71 65  Resp: 18 18  Temp: 98.1 F (36.7 C) 97.6 F (36.4 C)  SpO2: 98% 99%  Patient still on losartan will discontinue. Have also reduced midodrine dose. Off scopolamine which can also lower blood pressure  3/17- BP still soft supine in bed- have ordered Orthostatics by nursing- and will see if need to increase Midodrine again, due to soft BP with laying down  3/18- will increase Midodrine to 5 mg TID since BP dropped to 70's yesterday and almost passed out. 3/19- BP lying down 140s yesterday but better this AM-  3/20- Pt and I agree a little Permissive elevated BP supine is better than dropping BP in therapy- will increase Midodrine ot compensate for Low BP form Flomax starting.   3/23  another significant spell yesterday while he had two prolonged ambulation experiences before and after without issue  -may be the timing of midodrine.   -also now on flomax which might be exacerbating  -continue with teds/binder etc, push fluids  -in the short term should avoid prolonged ambulation/upright activities when it is longer than 3 hours since last midodrine dose 10.  BPH with likely neurogenic bladder.  Proscar 5 mg nightly.  Check PVR             - Has coude catheter placed 2/27, patient hesitant to remove, OK to keep tonight and would re-address in AM - states he needs to be able to stand to urinate d/t BPH   3/8- explained can wait 2 days, but then have to remove foley-   3/10---remove foley--voiding trial  3/12- Requiring caths- no real voiding so far - will add Urecholine, because OH makes it difficult to add Flomax. Add 5 mg TID  for now- monitor  3/13- will wait on Flomax- until BP doing better- bladder scans and caths up to 1000cc- needs to do every 4 hours based on cath results-d/w nursing 3/14- wrote order for coude' caths only and lidocaine jelly with caths  Midodrine is an alpha-blocker which also may be affecting bladder outflow, will start weaning today.  Scopolamine has anticholinergic effects which can also reduce bladder emptying and has been discontinued.  Certainly his urinary issues may be related to his spinal cord injury but will try to minimize pharmaceutical effects of his meds 3/17- they placed Foley this weekend due to trauma- will keep 3-4 days then d/c.   3/18- will d/c foley Thursday AM since placed Sunday night-for trauma- pt said "happy for break from cathing"  3/19- will remove Foley in AM/Thursday since don't want pt getting UTI 3/20- will d/c Foley today- wil add Flomax and increase Midodrine to help combat the [facility] will get from Flomax.  3/22-23 foley replaced d/t retention/hematuria--primary team can reassess this coming week.   -urine remains clear/yellow -continue flomax (see discussion #9) 11.  Constipation with likely neurogenic bowel.   No BM since admission               - Miralax 17 g BID, sennakot S 2 tabs BID              - PRN sorbitol for moderate constipation, PRN fleet enema for severe constipation   3/8- will give 45 cc Sorbitol and if no results, Soap suds enema this evening  3/10- bm's continent yesterday--type 6--no changes to regimen today  3/11- Strained but not able to go this AM- will montior  3/12- LBM yesterday per pt- but 3/9 per chart- will check with therapy- if no results, will need more intervention- already on Miralax BID, Senna 2 tabs BID  3/13- LBM yesterday  3/14- LBM 2 days ago  3/18- LBM 2 days ago  3/19- LBM  this AM  LBM 3/22 12.  Acute respiratory failure with hypoxia/bilateral pneumonia.  Continue cefepime x 7 days.  Pulmonary services have signed  off.              - Ongoing significant secretions with frequent suctioning              - Robitussin 5 mg Q6H             - Biotene PRN   3/8- running Cefepime now- con't 7 days total  3/9- CXR looks good- only mild L sided  atelectasis - added Scopolamine due to increased secretions for a few days only  3/12- changed Duonebs back to QID from prn  3/13- improved secretions with Scopolamine - this may be contributing to retention no issues with secretions consider d/c  3/17- Scopolamine stopped  3/20- still using O2 at night- 2L- want sot have at home- knows insurance won't cover it- but wants for airhunger- still using ICS regularly.   13. L shoulder weakness and pain and new R shoulder pain.  No fracture on xray 2/26.              - Likely due to L C4 myelopathy; however no accompanying sensory deficit and no other weakness. I suspect a radicular component also  - Monitor with therapies; would consider MRI shoulder for rotator cuff pathology if no improvement   3/13- Pt's main pain is anterior and upper trap on L side- will con't to monitor- doesn't want steroid injections at this time- R shoulder pain started overnight from pulling in bed- appears to be biceps tendinitis- wants to wait on steroids due to concern with hallucinations  3/14- ended up doing steroid injection with 20mg  of kenalog- pain doing better  3/19- pt refused L shoulder MRI  3/21- pt was discussing a magnesium spray- which is fine to try for L shoulder,  Trap pain with substitution for delt weakness, add sportscream  -3/22-23 have discussed left shoulder subluxation--encouraged him to keep arm elevated on pillows to prevent worsening 14. Restlessness/psychomotor agitation  3/13- will try Seroquel 12.5 mg at bedtime- d/w TBI MD and she agreed it's better than Depakote.   3/14- increasing seroquel to 25 mg QHS  Sleeping ok, now on seroquel , d/c trazodone, anticholinergic effect  15. At risk for Autonomic  dysreflexia  3/21- educated on this and that it's a medical emergency and we need to treat aggressively.      LOS: 16 days A FACE TO FACE EVALUATION WAS PERFORMED  Ranelle Oyster 10/03/2023, 8:56 AM

## 2023-10-03 NOTE — Plan of Care (Signed)
  Problem: Consults Goal: RH SPINAL CORD INJURY PATIENT EDUCATION Description:  See Patient Education module for education specifics.  Outcome: Progressing   Problem: SCI BOWEL ELIMINATION Goal: RH STG MANAGE BOWEL WITH ASSISTANCE Description: STG Manage Bowel with mod I Assistance. Outcome: Progressing Goal: RH STG SCI MANAGE BOWEL WITH MEDICATION WITH ASSISTANCE Description: STG SCI Manage bowel with medication with mod I assistance. Outcome: Progressing   Problem: SCI BLADDER ELIMINATION Goal: RH STG MANAGE BLADDER WITH ASSISTANCE Description: STG Manage Bladder With mod I Assistance Outcome: Progressing Goal: RH STG MANAGE BLADDER WITH MEDICATION WITH ASSISTANCE Description: STG Manage Bladder With Medication With mod I Assistance. Outcome: Progressing   Problem: RH SAFETY Goal: RH STG ADHERE TO SAFETY PRECAUTIONS W/ASSISTANCE/DEVICE Description: STG Adhere to Safety Precautions With cues Assistance/Device. Outcome: Progressing   Problem: RH BOWEL ELIMINATION Goal: RH STG MANAGE BOWEL WITH ASSISTANCE Description: STG Manage Bowel with Assistance. Outcome: Progressing Goal: RH STG MANAGE BOWEL W/MEDICATION W/ASSISTANCE Description: STG Manage Bowel with Medication with Assistance. Outcome: Progressing   Problem: RH BLADDER ELIMINATION Goal: RH STG MANAGE BLADDER WITH ASSISTANCE Description: STG Manage Bladder With Assistance Outcome: Progressing

## 2023-10-03 NOTE — Progress Notes (Signed)
 Occupational Therapy Session Note  Patient Details  Name: Brandon Watts MRN: [id number] Date of Birth: 07-04-1946   Short Term Goals: Week 3:  OT Short Term Goal 1 (Week 3): STG=LTG 2/2 ELOS  Skilled Therapeutic Interventions/Progress Updates:  Attempted to see patient for highly requested session around 11am, as this therapist had a cancellation. Patient and spouse found to be off unit, RN/NT updated.   Therapy Documentation Precautions:  Precautions Precautions: Fall, Cervical Precaution/Restrictions Comments: Foley Required Braces or Orthoses: Cervical Brace Cervical Brace: Hard collar, Soft collar Restrictions Weight Bearing Restrictions Per Provider Order: No  Therapy/Group: Individual Therapy  Lou Cal, OTR/L, MSOT  10/03/2023, 1:07 PM

## 2023-10-03 NOTE — Plan of Care (Signed)
  Problem: SCI BOWEL ELIMINATION Goal: RH STG MANAGE BOWEL WITH ASSISTANCE Description: STG Manage Bowel with mod I Assistance. Outcome: Progressing   Problem: SCI BLADDER ELIMINATION Goal: RH STG MANAGE BLADDER WITH ASSISTANCE Description: STG Manage Bladder With mod I Assistance Outcome: Progressing   Problem: RH SAFETY Goal: RH STG ADHERE TO SAFETY PRECAUTIONS W/ASSISTANCE/DEVICE Description: STG Adhere to Safety Precautions With cues Assistance/Device. Outcome: Progressing   Problem: RH PAIN MANAGEMENT Goal: RH STG PAIN MANAGED AT OR BELOW PT'S PAIN GOAL Description: < 4 with prns Outcome: Progressing

## 2023-10-03 NOTE — Progress Notes (Addendum)
 Patient pleasant, no issues this shift. Surgical incision remains intact  w/adhesive strips in place, no signs or symptoms of infection. Ambulated x 2 in the hallway with staff. Ted hose, abdominal binder in place, [place] J collar on when out of bed. Soft collar on when in bed. Orthostatic vital signs obtained. CHG/Foley care given, new stat lock place due to current one being soiled. Toileted throughout the day. Fluid restrictions followed, bedside table, reacher within reach. Wife at bedside. No bowel movement this shift. Last BM 09/30/23  [email or web address]

## 2023-10-04 LAB — CBC
HCT: 38.2 % — ABNORMAL LOW (ref 39.0–52.0)
Hemoglobin: 12.8 g/dL — ABNORMAL LOW (ref 13.0–17.0)
MCH: 32.2 pg (ref 26.0–34.0)
MCHC: 33.5 g/dL (ref 30.0–36.0)
MCV: 96.2 fL (ref 80.0–100.0)
Platelets: 202 10*3/uL (ref 150–400)
RBC: 3.97 MIL/uL — ABNORMAL LOW (ref 4.22–5.81)
RDW: 11.9 % (ref 11.5–15.5)
WBC: 7.2 10*3/uL (ref 4.0–10.5)
nRBC: 0 % (ref 0.0–0.2)

## 2023-10-04 LAB — MAGNESIUM: Magnesium: 2.1 mg/dL (ref 1.7–2.4)

## 2023-10-04 LAB — COMPREHENSIVE METABOLIC PANEL
ALT: 21 U/L (ref 0–44)
AST: 17 U/L (ref 15–41)
Albumin: 3.2 g/dL — ABNORMAL LOW (ref 3.5–5.0)
Alkaline Phosphatase: 55 U/L (ref 38–126)
Anion gap: 8 (ref 5–15)
BUN: 16 mg/dL (ref 8–23)
CO2: 26 mmol/L (ref 22–32)
Calcium: 10.7 mg/dL — ABNORMAL HIGH (ref 8.9–10.3)
Chloride: 106 mmol/L (ref 98–111)
Creatinine, Ser: 0.89 mg/dL (ref 0.61–1.24)
GFR, Estimated: >60 mL/min (ref >60)
Glucose, Bld: 90 mg/dL (ref 70–99)
Potassium: 4.3 mmol/L (ref 3.5–5.1)
Sodium: 140 mmol/L (ref 135–145)
Total Bilirubin: 1.1 mg/dL (ref 0.0–1.2)
Total Protein: 5.9 g/dL — ABNORMAL LOW (ref 6.5–8.1)

## 2023-10-04 NOTE — Progress Notes (Signed)
 Speech Language Pathology Weekly Progress and Session Note  Patient Details  Name: Brandon Watts MRN: [id number] Date of Birth: Aug 05, 1945  Beginning of progress report period: September 27, 2023 End of progress report period: October 04, 2023  Today's Date: 10/04/2023 SLP Individual Time: 1100-1200 SLP Individual Time Calculation (min): 60 min  Short Term Goals: Week 2: SLP Short Term Goal 1 (Week 2): Pt will complete oropharyngeal exercises w/ s visual/verbal cues SLP Short Term Goal 1 - Progress (Week 2): Met SLP Short Term Goal 2 (Week 2): Pt will utilize safe swallow strategies during PO intake with s cues SLP Short Term Goal 2 - Progress (Week 2): Met SLP Short Term Goal 3 (Week 2): Pt will present w/ overt s/s of airway invasion 75% of the time or less during PO intake SLP Short Term Goal 3 - Progress (Week 2): Met    New Short Term Goals: Week 3: SLP Short Term Goal 1 (Week 3): STGs = LTGs d/t ELOS  Weekly Progress Updates: Pt has made excellent progress this week as demonstrated by reduced pre vertebral edema and improved pharyngeal clearance. He could tolerate a regular diet/thin liquids independently, though requested to remain on Dys 3 textures given bite size meats. SLP also introduced cognitive goals targeting complex processing and memory this week and he continues to complete tasks @ modI or independently. Anticipate medications/fatigue will continue to result in slightly variable attention/alertness and subsequent carryover, however, he continues to demonstrate adequate cognition when fully awake/alert. Pt/family education ongoing. He would benefit from continued ST at this time to target pharyngeal dysphagia and cognition.    Intensity: Minumum of 1-2 x/day, 30 to 90 minutes Frequency: 1 to 3 out of 7 days Duration/Length of Stay: 3/31 Treatment/Interventions: Dysphagia/aspiration precaution training;Patient/family education;Therapeutic Activities;Functional tasks;Cueing  hierarchy;Cognitive remediation/compensation   Daily Session  Skilled Therapeutic Interventions:   The pt and his wife were greeted at bedside. He reported increased pain today d/t timing of pain medication and OT tx session. Despite this, he was pleasant and motivated throughout tx tasks targeting dysphagia and cognition. He completed 3 pharyngeal strengthening exercises x10 each independently. He reported slightly increased globus sensation today when swallowing his larger pills whole. However, he independently utilized compensatory strategies to alleviate the sensation and assist with pharyngeal clearance. He then completed a 3 step memory task independently and a word organization task @ modI when provided w/ additional processing time. Anticipate cognition will continue to vary slightly given fatigue/endurance deficits and medications, but he continues to demonstrate adequate carryover of information and complex problem solving skills. He was left in his bed with the call light within reach and family present at bedside. Recommend cont ST per POC at this time.         Pain 7/10 L shoulder pain, reducing to 5/10 at the end of tx tasks. Pain medication provided prior to ST.   Therapy/Group: Individual Therapy  [name] 10/04/2023, 12:06 PM

## 2023-10-04 NOTE — Progress Notes (Signed)
 PROGRESS NOTE   Subjective/Complaints:  Pt has foley catheter in place, had tramuatic cath with bleeding noted.  Urine yellow and fairly clear this morning but had small amount of bleeding around the meatus.  He is working with therapy on tub transfers this AM. Continues to have issues with orthostatic hypotension.  Having pain that will shoot into her shoulders, patient is taking OxyContin 10 mg twice daily.   ROS: Patient denies fever, new vision changes,  nausea, vomiting, diarrhea,  shortness of breath or chest pain, headache, or mood change. +dizziness-intermittently when standing  Objective:   No results found.   Recent Labs    10/04/23 0611  WBC 7.2  HGB 12.8*  HCT 38.2*  PLT 202    Recent Labs    10/04/23 0611  NA 140  K 4.3  CL 106  CO2 26  GLUCOSE 90  BUN 16  CREATININE 0.89  CALCIUM 10.7*     Intake/Output Summary (Last 24 hours) at 10/04/2023 1655 Last data filed at 10/04/2023 0813 Gross per 24 hour  Intake --  Output 1900 ml  Net -1900 ml        Physical Exam: Vital Signs Blood pressure 124/79, pulse 61, temperature 98.4 F (36.9 C), resp. rate 18, height 5\' 8"  (1.727 m), weight 69.2 kg, SpO2 100%.  Constitutional: No distress . Vital signs reviewed.  Working with therapy on tub transfers HEENT: NCAT, EOMI, oral membranes moist Neck: supple Cardiovascular: RRR   Respiratory/Chest: CTAB, Nonlabored breathing GI/Abdomen: non-tender, non-distended Ext: no clubbing, cyanosis, or edema  Psych: pleasant and cooperative   Neurologic exam:  Cognition: AAO x3 Language: Fluent, Mild dysarthria.   Insight: Fair insight into current condition.    Prior exam:  3 beats clonus in LE's- no increased tone seen MSK: Very TTP with hyperalgesia over L deltoid and shoulder laterally- NO change today. 1/2" sublux of shoulder--tender with PROM RUE- Biceps 4-/5; WE 4/5; Triceps 4-/5; Grip 3+ to 4-/5,  FA 3+/5 LUE_ Biceps 2+/5; WE 3+/5; Triceps 2+/5; Grip 3/5; and FA 3/5 less  TTP over R anterior shoulder with now no pain with resisted flexion- t  Skin: C/D/I. No apparent lesions.IV intact    Sensation: To light touch hypersensitive in LUE at shoulder  Reflexes :Negative Hoffman's and babinski signs bilaterally.  CN: 2-12 grossly intact.  Coordination: No apparent tremors. Weakness limits FNF on left ; + Mild LLE HTS Spasticity: MAS 0 in all extremities. Flaccid LUE. C/W today's exam 10/04/2023.       Assessment/Plan: 1. Functional deficits which require 3+ hours per day of interdisciplinary therapy in a comprehensive inpatient rehab setting. Physiatrist is providing close team supervision and 24 hour management of active medical problems listed below. Physiatrist and rehab team continue to assess barriers to discharge/monitor patient progress toward functional and medical goals  Care Tool:  Bathing    Body parts bathed by patient: Left arm, Chest, Abdomen, Front perineal area, Buttocks, Right upper leg, Left upper leg, Face   Body parts bathed by helper: Buttocks, Right lower leg, Left lower leg, Right arm, Left arm, Front perineal area, Right upper leg, Left upper leg, Face Body parts n/a: Right lower  leg, Left lower leg   Bathing assist Assist Level: Moderate Assistance - Patient 50 - 74%     Upper Body Dressing/Undressing Upper body dressing   What is the patient wearing?: Pull over shirt    Upper body assist Assist Level: Moderate Assistance - Patient 50 - 74%    Lower Body Dressing/Undressing Lower body dressing      What is the patient wearing?: Pants     Lower body assist Assist for lower body dressing: Moderate Assistance - Patient 50 - 74%     Toileting Toileting    Toileting assist Assist for toileting: Total Assistance - Patient < 25%     Transfers Chair/bed transfer  Transfers assist     Chair/bed transfer assist level: Contact Guard/Touching  assist     Locomotion Ambulation   Ambulation assist   Ambulation activity did not occur: Safety/medical concerns (2/2 endurance deficit/ limited activity tolerance)  Assist level: Contact Guard/Touching assist Assistive device: Walker-rolling Max distance: 150   Walk 10 feet activity   Assist  Walk 10 feet activity did not occur: Safety/medical concerns (2/2 endurance deficit/ limited activity tolerance)  Assist level: Contact Guard/Touching assist Assistive device: Walker-rolling   Walk 50 feet activity   Assist Walk 50 feet with 2 turns activity did not occur: Safety/medical concerns (2/2 endurance deficit/ limited activity tolerance)  Assist level: Minimal Assistance - Patient > 75% Assistive device: Walker-rolling    Walk 150 feet activity   Assist Walk 150 feet activity did not occur: Safety/medical concerns (2/2 endurance deficit/ limited activity tolerance)  Assist level: Contact Guard/Touching assist Assistive device: Walker-rolling    Walk 10 feet on uneven surface  activity   Assist Walk 10 feet on uneven surfaces activity did not occur: Safety/medical concerns (d/t limited activity tolerance and imbalance)         Wheelchair     Assist Is the patient using a wheelchair?: Yes Type of Wheelchair: Manual Wheelchair activity did not occur: Safety/medical concerns (2/2 endurance deficit/ limited activity tolerance)  Wheelchair assist level: Dependent - Patient 0%      Wheelchair 50 feet with 2 turns activity    Assist    Wheelchair 50 feet with 2 turns activity did not occur: Safety/medical concerns (2/2 endurance deficit/ limited activity tolerance)       Wheelchair 150 feet activity     Assist  Wheelchair 150 feet activity did not occur: Safety/medical concerns (2/2 endurance deficit/ limited activity tolerance)       Blood pressure 124/79, pulse 61, temperature 98.4 F (36.9 C), resp. rate 18, height 5\' 8"  (1.727 m),  weight 69.2 kg, SpO2 100%.   Medical Problem List and Plan: 1. Functional deficits due to SCI (central cord syndrome)- C4 ASIA C/D (incomplete quadriplegia)  with C4 fracture s/p arthrodesis C3-4 anterior interbody technique discectomy for decompression of spinal cord and exiting nerve roots with foraminotomies as well as additional level C4-5 interbody technique for decompression placement of anterior instrumentation consisting of interbody plate and screws C3-4-5 09/09/2023.  Cervical collar at all times             -patient may  shower             -ELOS/Goals: 18 to 21 days, min assist PT/OT/SLP                            -On admission Dr. Maisie Fus cleared patient for soft collar when in bed for  comfort; ortho tech order placed for soft collar and nursing order for soft collar in bed, alternating to hard collar when OOB   - will allow dog to come in from home with papers  - not ready for grounds pass quite yet  Started estim D/c 3/31 -Continue CIR therapies including PT, OT, and SLP. Will hold off on ambulation with wife until bp is more predictable. Pt understands -Team conference tomorrow, his wife indicate he did interest in a potentially longer duration of therapy 2.  Antithrombotics: -DVT/anticoagulation/segmental LUL PE:  Pharmaceutical: Lovenox initiated 09/11/2023 and titrated.  Neurosurgery Dr. Maisie Fus stating can switch to DOAC 3/7, since last hemoglobin 3/5 will wait until a.m. labs and switch if stable 3/8- pt refused labs this AM- will wait til Monday when have labs 3/12- Switched to Eliquis 5 mg BID from Tx dose lovenox  3/20- will need for 3 months total- went over that increased risk of DVT/PE is for a total of 3 months for since has 2 PE's             -antiplatelet therapy: N/A   3. Pain Management: OxyContin sustained release 10 mg every 12 hours, Neurontin 300 mg 3 times daily Robaxin as needed.  Will add tramadol as needed   3/8- pt reports pain usually controlled- con't  regimen  3/10--still having a lot of LUE--increase gabapentin to 400mg  tid  3/11- change Tylenol to 1000 mg q8 hours per pt request  3/12- has been helpful to add tylenol  3/17- has tramadol- not really using much- last used 3/14  3/18- 3/19 still has hyperalgesia- of LUE but we discussed using other nerve pain meds- but wait since they will cause urinary retention- doing desensitization techniques  -also refused MRI of L shoulder since "hates MRI machines"   3/20- will add Baclofen 5 mg TID prn for spasms and stop Robaxin  3/22-23 baclofen seems effective for spasms 4. Mood/Behavior/Sleep/Hallucinations: Melatonin 5 mg nightly.  Xanax Dced on admission per family d/t confusion.              - antipsychotic agents: Seroquel 12.5-25 mg PRN              - Delirium precautions with stimulation during the daytime, quiet with low lights an minimal interruptions at nighttime, limit lines/tubes/drains to essential access only, limit physical and chemical restraints               - Melatonin 5 mg at bedtime, trazodone 25-50 mg PRN.  - May benefit from standing at bedtime seroquel if trazodone ineffective.    3/8- slept OK- will con't regimen for now  3/10 continues to struggle with sleep, partially due to pain    -gabapentin increased above   -will schedule trazodone @2100  with back up dose prn       -pt voiced some feeling of depression over his accident/ condition -he is interested in speaking with neuropsychology. -will ask Dr. Kieth Brightly to see patient regarding reactive depression. 3/140- added Seroquel yesterday for psychomotor agitation- will increase to 25 mg at bedtime tonight  3/17- sleeping great- feels more confused/delayed this AM- don't see a medicine that could cause this 3/18- off Trazodone due to urinary issues- admits very sad- but doesn't want to add meds! 3/19- Pt refuses SSRI for mood 5. Neuropsych/cognition/small SDH: This patient is not completely capable of making decisions  on his own behalf.              - telesitter  continued --renewed daily    3/20- got off Telesitter 6. Skin/Wound Care: Routine skin checks 7. Fluids/Electrolytes/Nutrition/hyponatremia: Routine in and outs with follow-up chemistries    Latest Ref Rng & Units 10/04/2023    6:11 AM 09/30/2023    6:11 AM 09/27/2023    5:40 AM  BMP  Glucose 70 - 99 mg/dL 90  97  454   BUN 8 - 23 mg/dL 16  17  28    Creatinine 0.61 - 1.24 mg/dL 0.98  1.19  1.47   Sodium 135 - 145 mmol/L 140  140  136   Potassium 3.5 - 5.1 mmol/L 4.3  4.1  4.3   Chloride 98 - 111 mmol/L 106  106  104   CO2 22 - 32 mmol/L 26  28  28    Calcium 8.9 - 10.3 mg/dL 82.9  56.2  13.0     8.  Dysphagia. Cortrak placed 3/5.   Currently on a mechanical soft diet with MBS pending Advance diet as tolerated per speech therapy              - On TF with 50 ml/hr osmolite 1.5 + prosource 60 ml daily              - Dietary consulted for management   3/8- will add IVFs, pt insistent- for dehydration- refused labs today so cannot see Cr/BUN to assess- but says not drinking enough  3/10--changed ivf to free water thru tube--encourage PO as well  **update 4:30pm--pt cleared for D2/thins per SLP!   -RD transitioning to nocturnal feeds beginning at 0500 tomorrow  3/11- fed self breakfast this Am and dinner last night for first time  3/12- con't cortrak  3/13- Got OK by Dietician to remove Cortrak and is on D3 thin diet, so no need for steroids- and would probably do better if remove Cortrak- will remove today and monitor  3/14- doing ok without Cortrak  3/18- Is dry- BUN 28- will have pt push fluids  3/23 pushing po, labs again Monday  3/24 BUN/Cr stable at 16/0.89 9.  Hypertension with Orthostatic hypotension.  Cozaar 50 mg daily.  Monitor with increased mobility.              - Holding home hydrochlorothiazide    3/8- BP 110s- a little soft- monitor for Orthostatic hypotension  3/11- will decrease Cozaar to 25 mg daily and add Midodrine 2.5 mg  TID with meals- to start at 6:30am since almost passed out 2x per PT.   3/12- BP dropped to 58 systolic yesterday-added Midodrine- BP is slightly elevated in supine, but dropping dramatically in spite of TEDs and ACE wraps   3/13- Increased Midodrine to 5 mg TID- will con't Abd binder, TEDs and ACE wraps.   3/14- BP did better with OT today lowest low 100s systolic, with abd binder, TED's, ACE wraps and midodrine Vitals:   10/04/23 0538 10/04/23 1453  BP: 119/74 124/79  Pulse: 62 61  Resp: 17 18  Temp: 97.7 F (36.5 C) 98.4 F (36.9 C)  SpO2: 94% 100%  Patient still on losartan will discontinue. Have also reduced midodrine dose. Off scopolamine which can also lower blood pressure  3/17- BP still soft supine in bed- have ordered Orthostatics by nursing- and will see if need to increase Midodrine again, due to soft BP with laying down  3/18- will increase Midodrine to 5 mg TID since BP dropped to 70's yesterday and almost passed out. 3/19- BP lying down 140s  yesterday but better this AM-  3/20- Pt and I agree a little Permissive elevated BP supine is better than dropping BP in therapy- will increase Midodrine ot compensate for Low BP form Flomax starting.   3/23 another significant spell yesterday while he had two prolonged ambulation experiences before and after without issue  -may be the timing of midodrine.   -also now on flomax which might be exacerbating  -continue with teds/binder etc, push fluids  -in the short term should avoid prolonged ambulation/upright activities when it is longer than 3 hours since last midodrine dose 3/24 Continues to have dizziness with orthostatic hypotension during therapy.  Consider discontinue Flomax 10.  BPH with likely neurogenic bladder.  Proscar 5 mg nightly.  Check PVR             - Has coude catheter placed 2/27, patient hesitant to remove, OK to keep tonight and would re-address in AM - states he needs to be able to stand to urinate d/t BPH   3/8-  explained can wait 2 days, but then have to remove foley-   3/10---remove foley--voiding trial  3/12- Requiring caths- no real voiding so far - will add Urecholine, because OH makes it difficult to add Flomax. Add 5 mg TID for now- monitor  3/13- will wait on Flomax- until BP doing better- bladder scans and caths up to 1000cc- needs to do every 4 hours based on cath results-d/w nursing 3/14- wrote order for coude' caths only and lidocaine jelly with caths  Midodrine is an alpha-blocker which also may be affecting bladder outflow, will start weaning today.  Scopolamine has anticholinergic effects which can also reduce bladder emptying and has been discontinued.  Certainly his urinary issues may be related to his spinal cord injury but will try to minimize pharmaceutical effects of his meds 3/17- they placed Foley this weekend due to trauma- will keep 3-4 days then d/c.   3/18- will d/c foley Thursday AM since placed Sunday night-for trauma- pt said "happy for break from cathing"  3/19- will remove Foley in AM/Thursday since don't want pt getting UTI 3/20- will d/c Foley today- wil add Flomax and increase Midodrine to help combat the [facility] will get from Flomax.  3/22-23 foley replaced d/t retention/hematuria--primary team can reassess this coming week.   -urine remains clear/yellow -continue flomax (see discussion #9) 3/24 continue Foley 11.  Constipation with likely neurogenic bowel.   No BM since admission               - Miralax 17 g BID, sennakot S 2 tabs BID              - PRN sorbitol for moderate constipation, PRN fleet enema for severe constipation   3/8- will give 45 cc Sorbitol and if no results, Soap suds enema this evening  3/10- bm's continent yesterday--type 6--no changes to regimen today  3/11- Strained but not able to go this AM- will montior  3/12- LBM yesterday per pt- but 3/9 per chart- will check with therapy- if no results, will need more intervention- already on Miralax BID,  Senna 2 tabs BID  3/13- LBM yesterday  3/14- LBM 2 days ago  3/18- LBM 2 days ago  3/19- LBM  this AM  LBM 3/24 12.  Acute respiratory failure with hypoxia/bilateral pneumonia.  Continue cefepime x 7 days.  Pulmonary services have signed off.              - Ongoing significant secretions  with frequent suctioning              - Robitussin 5 mg Q6H             - Biotene PRN   3/8- running Cefepime now- con't 7 days total  3/9- CXR looks good- only mild L sided atelectasis - added Scopolamine due to increased secretions for a few days only  3/12- changed Duonebs back to QID from prn  3/13- improved secretions with Scopolamine - this may be contributing to retention no issues with secretions consider d/c  3/17- Scopolamine stopped  3/20- still using O2 at night- 2L- want sot have at home- knows insurance won't cover it- but wants for airhunger- still using ICS regularly.   13. L shoulder weakness and pain and new R shoulder pain.  No fracture on xray 2/26.              - Likely due to L C4 myelopathy; however no accompanying sensory deficit and no other weakness. I suspect a radicular component also  - Monitor with therapies; would consider MRI shoulder for rotator cuff pathology if no improvement   3/13- Pt's main pain is anterior and upper trap on L side- will con't to monitor- doesn't want steroid injections at this time- R shoulder pain started overnight from pulling in bed- appears to be biceps tendinitis- wants to wait on steroids due to concern with hallucinations  3/14- ended up doing steroid injection with 20mg  of kenalog- pain doing better  3/19- pt refused L shoulder MRI  3/21- pt was discussing a magnesium spray- which is fine to try for L shoulder,  Trap pain with substitution for delt weakness, add sportscream  -3/22-23 have discussed left shoulder subluxation--encouraged him to keep arm elevated on pillows to prevent worsening 14. Restlessness/psychomotor agitation  3/13- will  try Seroquel 12.5 mg at bedtime- d/w TBI MD and she agreed it's better than Depakote.   3/14- increasing seroquel to 25 mg QHS  Sleeping ok, now on seroquel , d/c trazodone, anticholinergic effect  15. 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      LOS: 17 days A FACE TO FACE EVALUATION WAS PERFORMED  Fanny Dance 10/04/2023, 4:55 PM

## 2023-10-04 NOTE — Progress Notes (Incomplete)
 Occupational Therapy Session Note  Patient Details  Name: Brandon Watts MRN: [id number] Date of Birth: 26-Apr-1946  {CHL IP REHAB OT TIME CALCULATIONS:[id number]}   Short Term Goals: Week 2:  OT Short Term Goal 1 (Week 3): STG=LTG 2/2 ELOS  Skilled Therapeutic Interventions/Progress Updates:    Patient agreeable to participate in OT session. Reports *** pain level.   Patient participated in skilled OT session focusing on ***. Therapist facilitated/assessed/developed/educated/integrated/elicited *** in order to improve/facilitate/promote    Therapy Documentation Precautions:  Precautions Precautions: Fall, Cervical Precaution/Restrictions Comments: Foley Required Braces or Orthoses: Cervical Brace Cervical Brace: Hard collar, Soft collar Restrictions Weight Bearing Restrictions Per Provider Order: No  Therapy/Group: Individual Therapy  Limmie Patricia, OTR/L,CBIS  Supplemental OT - [facility] and [facility] Secure Chat Preferred   10/04/2023, 5:23 PM

## 2023-10-04 NOTE — Progress Notes (Addendum)
 Small amount of blood noted to meatus. Catheter tubing secure, no kinks in tubing, stat lock in place. Catheter bag below bladder. Catheter patent Notified MD. No New orders at this time. Participated in all therapies. Surgical incision remains intact  w/adhesive strips in place, no signs or symptoms of infection. Ted hose, abdominal binder in place, [place] J collar on when out of bed. Soft collar on when in bed. CHG/Foley care given. Bedside table, reacher within reach. Wife at bedside.  Last BM  10/04/23  Tilden Dome, LPN

## 2023-10-04 NOTE — Plan of Care (Signed)
  Problem: SCI BOWEL ELIMINATION Goal: RH STG MANAGE BOWEL WITH ASSISTANCE Description: STG Manage Bowel with mod I Assistance. Outcome: Progressing Goal: RH STG SCI MANAGE BOWEL WITH MEDICATION WITH ASSISTANCE Description: STG SCI Manage bowel with medication with mod I assistance. Outcome: Progressing   Problem: SCI BLADDER ELIMINATION Goal: RH STG MANAGE BLADDER WITH ASSISTANCE Description: STG Manage Bladder With mod I Assistance Outcome: Progressing Goal: RH STG MANAGE BLADDER WITH MEDICATION WITH ASSISTANCE Description: STG Manage Bladder With Medication With mod I Assistance. Outcome: Progressing   Problem: RH SAFETY Goal: RH STG ADHERE TO SAFETY PRECAUTIONS W/ASSISTANCE/DEVICE Description: STG Adhere to Safety Precautions With cues Assistance/Device. Outcome: Progressing   Problem: RH BOWEL ELIMINATION Goal: RH STG MANAGE BOWEL WITH ASSISTANCE Description: STG Manage Bowel with Assistance. Outcome: Progressing Goal: RH STG MANAGE BOWEL W/MEDICATION W/ASSISTANCE Description: STG Manage Bowel with Medication with Assistance. Outcome: Progressing   Problem: RH BLADDER ELIMINATION Goal: RH STG MANAGE BLADDER WITH ASSISTANCE Description: STG Manage Bladder With Assistance Outcome: Progressing   Problem: RH SKIN INTEGRITY Goal: RH STG SKIN FREE OF INFECTION/BREAKDOWN Outcome: Progressing

## 2023-10-04 NOTE — Plan of Care (Signed)
  Problem: SCI BOWEL ELIMINATION Goal: RH STG MANAGE BOWEL WITH ASSISTANCE Description: STG Manage Bowel with mod I Assistance. Outcome: Progressing   Problem: SCI BLADDER ELIMINATION Goal: RH STG MANAGE BLADDER WITH ASSISTANCE Description: STG Manage Bladder With mod I Assistance Outcome: Progressing   Problem: RH SAFETY Goal: RH STG ADHERE TO SAFETY PRECAUTIONS W/ASSISTANCE/DEVICE Description: STG Adhere to Safety Precautions With cues Assistance/Device. Outcome: Progressing   Problem: RH PAIN MANAGEMENT Goal: RH STG PAIN MANAGED AT OR BELOW PT'S PAIN GOAL Description: < 4 with prns Outcome: Progressing

## 2023-10-04 NOTE — Progress Notes (Signed)
 Physical Therapy Session Note  Patient Details  Name: Brandon Watts MRN: [id number] Date of Birth: [birth year]  Today's Date: 10/04/2023 PT Individual Time: 1300-1415 PT Individual Time Calculation (min): 75 min   Short Term Goals: Week 2:  PT Short Term Goal 1 (Week 2): Pt will perform stand pivot transfers with SBA + LRAD PT Short Term Goal 2 (Week 2): Pt will ambulate over uneven ground with CGA and LRAD PT Short Term Goal 3 (Week 2): Pt will initiate car transfer training PT Short Term Goal 4 (Week 2): Pt will ambulate 4 steps with BHR per home set up with minA  Skilled Therapeutic Interventions/Progress Updates:    pt received in bed and agreeable to therapy. Pt reports pain largely controlled at this time.   Bed mobility with supervision. Pt then sat for ~5 min while discussing equipment (Rw vs rollator, and whether a w/c is appropriate).  Pt then ambulated throughout session with RW and CGA. After first walk, BP=123/68 (83) asymptomatic. Pt then participated in stair navigation, x 12, x 16, x 12 with CGA and R hand rail only. Pt with dizziness after stair navigation and BP=113/75 (88). Symptoms resolved quickly in sitting.  Therapist retrieved rollator for gait trial. Educated on safety when sitting and use of brakes. Pt ambulated x 90 ft and then rest of session using rollator with CGA and occasional min a. Pt performed circuit of 170 ft gait, x 10 Sit to stand, and x20 marches with 4 lb ankle weights, performed x 3 rounds.  X 2 instances of toe drag requiring min a, but pt largely able to catch. Discussed focus on incr foot clearance when fatigued (mimicked by weights)At end of last round, pt reports cold paresthesias in shoulders and BUE, which coincided with incr dizziness. BP then found to be 97/54(65). Discussed cold feeling as sign of when to take a rest.   Pt returned to room and remained in w/c, was left with all needs in reach and alarm active.   Therapy  Documentation Precautions:  Precautions Precautions: Fall, Cervical Precaution/Restrictions Comments: Foley Required Braces or Orthoses: Cervical Brace Cervical Brace: Hard collar, Soft collar Restrictions Weight Bearing Restrictions Per Provider Order: No General:       Therapy/Group: Individual Therapy  Juluis Rainier 10/04/2023, 3:10 PM

## 2023-10-04 NOTE — Progress Notes (Signed)
 Occupational Therapy Session Note  Patient Details  Name: Brandon Watts MRN: [id number] Date of Birth: Jan 12, 1946  Today's Date: 10/04/2023 OT Individual Time: 0815-0930 OT Individual Time Calculation (min): 75 min    Short Term Goals: Week 3:  OT Short Term Goal 1 (Week 3): STG=LTG 2/2 ELOS  Skilled Therapeutic Interventions/Progress Updates:    Pt resting in bed upon arrival and ready for therapy. OT intervention with focus on w/c mobility, standing balance, LUE function, TTB tranfsers, funcitonal amb with RW, and activity tolerance to increase independence with BADLs. Wife checked off on stand pivot transfers only (no ambulation in hallway.). Pt propelled w/c to day room. Standing activities included LUE toss for corn hole and BUE use for cleaning bean bags. All standing with CGA. Pt propelled w/c to gym to practice use of reacher to retrieve tennis ball and toss tennis ball so he can "play" with his dog at home. Pt transitioned to ADL apartment and practiced TTB transfers with supervision. Pt propelled w/c back to gym and OTA continued return to room. Pt requested to use bathroom. Amb from threshold to bathroom with CGA. Pt able to pull pants down in preparation for use of BSC. Incontinent of bowel. Pt remained in bathroom with wife present. NT notified.  Therapy Documentation Precautions:  Precautions Precautions: Fall, Cervical Precaution/Restrictions Comments: Foley Required Braces or Orthoses: Cervical Brace Cervical Brace: Hard collar, Soft collar Restrictions Weight Bearing Restrictions Per Provider Order: No   Pain: Pt reports "some" LUE/deltoid discomfort; meds earlier in morning    Therapy/Group: Individual Therapy  [name] 10/04/2023, 10:39 AM

## 2023-10-05 MED ORDER — POLYETHYLENE GLYCOL 3350 17 G PO PACK
17.0000 g | PACK | Freq: Every day | ORAL | Status: DC | PRN
Start: 2023-10-05 — End: 2023-10-11
  Administered 2023-10-05 – 2023-10-08 (×2): 17 g via ORAL
  Filled 2023-10-05 (×2): qty 1

## 2023-10-05 NOTE — Progress Notes (Signed)
 Speech Language Pathology Daily Session Note  Patient Details  Name: Brandon Watts MRN: [id number] Date of Birth: [birth year]  Today's Date: 10/05/2023 SLP Individual Time: 0801-0846 SLP Individual Time Calculation (min): 45 min  Short Term Goals: Week 3: SLP Short Term Goal 1 (Week 3): STGs = LTGs d/t ELOS  Skilled Therapeutic Interventions:  Patient was seen in am to address dysphagia management and cognitive re- training. Pt was alert and seen at bedside. His wife was present and participating intermittently throughout session. Pt recalled pharyngeal strengthening exercises and subsequently completed 1 set of 10 [place], Mendelsohns, and open mouth swallows with mod I. Pt challenged to reduce amount of water consumed during exercises with pt able to decrease liquid washes to every two swallows. Pt endorsed greater feeding difficulty vs swallowing deficits with preference to remain on D3 diet for that reason. He verbalized remaining swallowing concerns with medication administration with pt reporting independence with smaller pills vs larger pills. In other minutes of session SLP addressed executive function through task addressing planning, organization, and budgeting. Pt completed planning and organization portions of tasks also demonstrating adequate working memory with mod I. Due to time constraints, SLP unable to address budgeting task. At conclusion of session, pt was left upright in bed with call button within reach, chair alarm active, and wife present. SLP to continue POC.   Pain Pain Assessment Pain Scale: 0-10 Pain Score: 2   Therapy/Group: Individual Therapy  Brandon Watts 10/05/2023, 11:37 AM

## 2023-10-05 NOTE — Progress Notes (Signed)
 Occupational Therapy Session Note  Patient Details  Name: Brandon Watts MRN: [id number] Date of Birth: 1946/03/29  Today's Date: 10/05/2023 OT Individual Time: 1400-1500 OT Individual Time Calculation (min): 60 min    Short Term Goals: Week 1:  OT Short Term Goal 1 (Week 1): Pt will perform toilet transfer with Mod A (x1) + LRAD. OT Short Term Goal 1 - Progress (Week 1): Met OT Short Term Goal 2 (Week 1): Pt will perform LB bathing with Mod A (x1) + LRAD. OT Short Term Goal 2 - Progress (Week 1): Progressing toward goal OT Short Term Goal 3 (Week 1): Pt will perform LB dressing with Mod A (x1) + LRAD. OT Short Term Goal 3 - Progress (Week 1): Progressing toward goal Week 2:  OT Short Term Goal 1 (Week 2): Pt will perform LB bathing with Mod A (x1) + LRAD. OT Short Term Goal 1 - Progress (Week 2): Met OT Short Term Goal 2 (Week 2): Pt will perform LB dressing with Mod A (x1) + LRAD. OT Short Term Goal 2 - Progress (Week 2): Met OT Short Term Goal 3 (Week 2): Pt will complete toileting tasks with mod A OT Short Term Goal 3 - Progress (Week 2): Met OT Short Term Goal 4 (Week 2): Pt will perform UB dressing tasks with mod A OT Short Term Goal 4 - Progress (Week 2): Met Week 3:  OT Short Term Goal 1 (Week 3): STG=LTG 2/2 ELOS  Skilled Therapeutic Interventions/Progress Updates:    1:1Pt received standing in the bathroom perform hygiene / clothing management. Reinforced that when the patient stands he needs someone right beside him. He reported his wife assisted in him into the bathroom. Decided to try showering again and problem solving method for success at home with management of BP. Pt sat on tub bench facing outwards and doffed all clothing except for TEDS. Pt BP at the start of shower with just TEDS was 133/113.  Pt showered with A for lower legs. Pt performed lateral leans for buttocks and periarea. Recommended for pt to not stand in the shower with BP issues. Pt does require A to wash  hair. After shower (with just TEDS) pt's BP was 93/60s. After showering pt sat on the EOB of the bench again facing outwards to dress. Wife, Brandon Watts, initially was in the bathroom at the beginning of session but then needed to be excused and was not back for the rest of shower or the dressing part afterwards. Pt able to don shirt with mod A and abdominal binder donned. Neck pads changed with total A. New TEDS and ace wraps donned sitting edge of bench. Pt then was able to thread pants and brief and pull then up in standing. BP in sitting after all orthosis donned was 113/60s. Discussed showering later in the day/ evening is a better time with his BP then first thing in the morning with his orthostatic hypotension- Brandon Watts and pt in agreement.   Discussion about DME for home and pt reports he does want a BSC. Pt was thinking about hospital bed at home. Practiced getting in and out of bed at 29 inches tall. Pt able to perform safely. Discussed and problem solved pillow placement for comfort for more upright positioning. Encouraged to try to sleep tonight without bed features tonight. Brandon Watts in agreement.   Therapy Documentation Precautions:  Precautions Precautions: Fall, Cervical Precaution/Restrictions Comments: Foley Required Braces or Orthoses: Cervical Brace Cervical Brace: Hard collar, Soft collar Restrictions  Weight Bearing Restrictions Per Provider Order: No  Pain: No reports pain   Therapy/Group: Individual Therapy  [name] [facility] 10/05/2023, 4:02 PM

## 2023-10-05 NOTE — Progress Notes (Signed)
 Patient ID: Brandon Watts, male   DOB: October 01, 1945, 78 y.o.   MRN: [id number]  SW met with pt wife to review discharge recs. SW will send referral to [facility] for PT/OT. SW explained to wife once referral is sent, she cannot schedule the appointment until the patient discharges. She states pt will need a rollator. SW will confirm with therapist. SW will order TTB and 3in1 BSC. Wife reports no oyxgen or hospital bed is needed.   Cecile Sheerer, MSW, LCSW Office: [phone] Cell: [phone] Fax: [phone]

## 2023-10-05 NOTE — Progress Notes (Signed)
 Speech Language Pathology Daily Session Note  Patient Details  Name: Brandon Watts MRN: [id number] Date of Birth: Mar 26, 1946  Today's Date: 10/05/2023 SLP Individual Time: 1130-1200 SLP Individual Time Calculation (min): 30 min  Short Term Goals: Week 3: SLP Short Term Goal 1 (Week 3): STGs = LTGs d/t ELOS  Skilled Therapeutic Interventions:   Pt and his wife were greeted at bedside. He was awake/alert, up in his wheelchair after PT tx session. SLP facilitated tx tasks targeting cognition. He independently recalled detailed information from this morning and yesterday afternoon. He also demonstrated adequate reasoning and planning during discussion re updates from team conference and ELOS. Additionally, he accurately completed a written information processing/visual organization task @ modI. He was left in his wheelchair with his wife present upon SLP arrival. Recommend cont ST at this time, however, anticipate pt is nearing baseline. He will likely benefit from only 1-3 additional tx sessions to ensure continued carryover of recommendations and adequate cognition for d/c needs.   Pain Pain Assessment Pain Scale: 0-10 Pain Score: 2   Therapy/Group: Individual Therapy  [name] 10/05/2023, 1:42 PM

## 2023-10-05 NOTE — Progress Notes (Signed)
 Physical Therapy Weekly Progress Note  Patient Details  Name: Brandon Watts MRN: [id number] Date of Birth: [birth year]  Beginning of progress report period: September 28, 2023 End of progress report period: October 05, 2023  Today's Date: 10/05/2023 PT Individual Time: 0950-1100 PT Individual Time Calculation (min): 70 min   Patient has met 4 of 4 short term goals.  Pt continues to make significant functional gains and has progressed to using a rollator for ambulation. With the rollator, pt is able to ambulate over uneven ground and for longer distances, and pt's awareness of fatigue levels and energy conservation given his OH symptoms is improving. Additionally, pts LUE function has improved and he is able to use it for some functional tasks (ie locking and unlocking rollator), although he still continues to experience proximal>distal weakness. Pt will continue to benefit from practice with stairs, tolerance to prolonged activity  and position changes, ambulation over uneven ground.  Patient continues to demonstrate the following deficits muscle weakness, abnormal tone, and decreased postural control, decreased balance strategies, and orthostatic hypotension  and therefore will continue to benefit from skilled PT intervention to increase functional independence with mobility.  Patient progressing toward long term goals..  Plan of care revisions: goals upgraded d/t recent progress.  PT Short Term Goals Week 2:  PT Short Term Goal 1 (Week 2): Pt will perform stand pivot transfers with SBA + LRAD PT Short Term Goal 1 - Progress (Week 2): Met PT Short Term Goal 2 (Week 2): Pt will ambulate over uneven ground with CGA and LRAD PT Short Term Goal 2 - Progress (Week 2): Met PT Short Term Goal 3 (Week 2): Pt will initiate car transfer training PT Short Term Goal 3 - Progress (Week 2): Met PT Short Term Goal 4 (Week 2): Pt will ambulate 4 steps with BHR per home set up with minA PT Short Term Goal 4 -  Progress (Week 2): Met Week 3:  PT Short Term Goal 1 (Week 3): =LTGs d/t ELOS  Skilled Therapeutic Interventions/Progress Updates:  Ambulation/gait training;Cognitive remediation/compensation;Discharge planning;DME/adaptive equipment instruction;Functional mobility training;Pain management;Psychosocial support;Splinting/orthotics;Therapeutic Activities;UE/LE Strength taining/ROM;Visual/perceptual remediation/compensation;UE/LE Coordination activities;Therapeutic Exercise;Stair training;Skin care/wound management;Patient/family education;Neuromuscular re-education;Functional electrical stimulation;Disease management/prevention;Community reintegration;Balance/vestibular training   Pt received supine in bed napping, agreeable to therapy upon being awoken. Pt has no c/o increased pain at this time, no intervention needed.   SPT dependently doffed soft cervical collar and donned hard cervical collar. Pt already wearing TED hose and ace wraps, SPT dependently donned abdominal binder. Discussed energy conservation technique and potential spots for rest breaks during longer gait training session to lobby and outside.   Pt ambulated with rollator and CGA fading to SBA [facility] with one brief (<5s) seated rest break at elevators - 542ft. Pt displayed good R foot clearance.  After longer seated rest break, pt ambulated >500' outside with several seated rest breaks with the rollator and SBA-CGA over grass, bricks, up and down ramp. Pt benefited from verbal cues for decreasing gait speed when going over changes in surface, safe use of DME when taking rest breaks.  Stair Training: with CGA, step through pattern, seated rest break between flights - 1 flight (22 steps) with 5/10 dizziness sx at top of steps, verbal cues for breathing - 1 flight (22 steps) with standing rest break for 3 breaths half way up stairs at landing, 3/10 dizziness reported - 1 flight (22 steps) with  standing rest break, dizziness 3/10 immediately  upon sitting but increased to 5-6/10, decreased with diaphragmatic breathing and leaning back onto rollator  Pt ambulated back to room - 420ft with rollator and SBA, continued to display good foot clearance.   E-stim set up for pt's L delt - 20mA with 5s isometric abduction hold. No redness/erythema or skin irritation noted upon removal. E-stim left for 20 mins with 30s off/10s on. Pt left seated in w/c with all needs in reach.   Therapy Documentation Precautions:  Precautions Precautions: Fall, Cervical Precaution/Restrictions Comments: Foley Required Braces or Orthoses: Cervical Brace Cervical Brace: Hard collar, Soft collar Restrictions Weight Bearing Restrictions Per Provider Order: No General:      Therapy/Group: Individual Therapy  [name] 10/05/2023, 12:47 PM
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
## 2023-10-05 NOTE — Patient Care Conference (Signed)
 Inpatient RehabilitationTeam Conference and Plan of Care Update Date: 10/05/2023   Time: 1103 am    Patient Name: Brandon Watts      Medical Record Number: [id number]  Date of Birth: 03/15/1946 Sex: Male         Room/Bed: 4W17C/4W17C-01 Payor Info: Payor: Advertising copywriter MEDICARE / Plan: [employer] MEDICARE / Product Type: *No Product type* /    Admit Date/Time:  09/17/2023  3:22 PM  Primary Diagnosis:  Quadriplegia, C1-C4, incomplete [facility])  Hospital Problems: Principal Problem:   Quadriplegia, C1-C4, incomplete (HCC) Active Problems:   C4 cervical fracture (HCC)   Central cord syndrome (HCC)   Dysphagia   Respiratory failure with hypoxia (HCC)   SDH (subdural hematoma) [facility])    Expected Discharge Date: Expected Discharge Date: 10/11/23  Team Members Present: Physician leading conference: Dr. Genice Rouge Social Worker Present: Cecile Sheerer, LCSWA Nurse Present: Konrad Dolores, RN PT Present: Bernie Covey, PT OT Present: [name], OT SLP Present: Other (comment) Alvera Novel SLP)     Current Status/Progress Goal Weekly Team Focus  Bowel/Bladder   Patient is continent of the bowel with episodes of incontinence. Patient currently has an indwelling foley catheter. LBM 3/24   Prevent infection (CAUTI). Remove foley asap and patient able to void.   Prevent infection (CAUTI) with Qshift foley care.    Swallow/Nutrition/ Hydration   Dys 3/thin   modI  continued tolerance, oropharyngeal swallowing exercises, pt/family ed    ADL's   bathing-mod A at shower level; UB dressing -min A; LB dressing-mod A; transfers with CGA; LUE at diminished level for funcitonal tasks   min a overall   education, BADLs, transfers, LUE NMR/strengthening    Mobility   CGA transfers, CGA gait >400 ft. Introduced rollator-min a to CGA d/t intermittent OH   supervision overall  rollator training, UE NMR, BP management    Communication   WFL            Safety/Cognition/  Behavioral Observations  added info processing, memory, and complex problem solving goals   modI   complex cognitive tasks    Pain   Patient has constant, bilateral pain in his shoulders. Typically reports pain of 5/10 or less.   Manage patient pain effectively with medication and non-pharmalogical methods.   Assess and manage patient's pain (5/10 or less).    Skin   Patient has midline neck incision with minimal drainage and no odor.   Incision continues to be free of signs of infection.  Assess incision Qshift for signs of infection.      Discharge Planning:  Pt will d/c to home with his wife. Pt would like to be able as independent as possible within reason. Family meeting held last week. Fam edu scheduled for Friday. SW will confirm there are no barriers to discharge.   Team Discussion: Patient admitted with SCI (central cord syndrome)- C4 ASIA C/D (incomplete quadriplegia) with C4 fracture; Status post arthrodesis C3-4 anterior interbody technique discectomy for decompression of spinal cord and exiting nerve roots with foraminotomies Additional level C4-5 interbody technique for decompression placement of anterior instrumentation consisting of interbody plate and screws C3-4-5 09/09/2023.  Patient has a subdural hematoma ,dysphagia, left shoulder weakness.  Patient limited by pain, spasms  hypotension and insomnia medications adjusted by MD.    Patient on target to meet rehab goals: yes, Patient requires min A with Upper body dressing and moderate assistance with  LB dressing. Patient transfers and ambulates up to >400 ft with  min-CGA  using a rollator. Overall goals are set for supervision-Mod I at discharge.  *See Care Plan and progress notes for long and short-term goals.   Revisions to Treatment Plan:  Dietary consult Neuropsych consult Family Training   Teaching Needs: Safety, medications, toileting , transfers, foley catheter care, etc.   Current Barriers to  Discharge: Decreased caregiver support and Neurogenic bowel and bladder  Possible Resolutions to Barriers: Family Education DME: Rollator     Medical Summary Current Status: LBM this AM- orthostatic hypotension-pain mainly in shoulders- foley due to trauma-  Barriers to Discharge: Behavior/Mood;Incontinence;Neurogenic Bowel & Bladder;Medical stability;Self-care education;Spasticity;Uncontrolled Pain;Hypotension;Weight bearing restrictions;Automomic dysreflexia  Barriers to Discharge Comments: limited by: UE weakness- LE weakness improving more; Orthostaitc hypotension; baclofen needed for spasticity as needed- still has foley due to trauma when removed it- will send to Urology after d/c Possible Resolutions to Barriers/Weekly Focus: requested D3 diet- carrying over exercises; stopped flomax due to [facility]; d/c- 3/31-   Continued Need for Acute Rehabilitation Level of Care: The patient requires daily medical management by a physician with specialized training in physical medicine and rehabilitation for the following reasons: Direction of a multidisciplinary physical rehabilitation program to maximize functional independence : Yes Medical management of patient stability for increased activity during participation in an intensive rehabilitation regime.: Yes Analysis of laboratory values and/or radiology reports with any subsequent need for medication adjustment and/or medical intervention. : Yes   I attest that I was present, lead the team conference, and concur with the assessment and plan of the team.   Gwenyth Allegra 10/06/2023, 4:11 PM

## 2023-10-05 NOTE — Plan of Care (Signed)
  Problem: SCI BOWEL ELIMINATION Goal: RH STG MANAGE BOWEL WITH ASSISTANCE Description: STG Manage Bowel with mod I Assistance. Outcome: Progressing Goal: RH STG SCI MANAGE BOWEL WITH MEDICATION WITH ASSISTANCE Description: STG SCI Manage bowel with medication with mod I assistance. Outcome: Progressing   Problem: RH SAFETY Goal: RH STG ADHERE TO SAFETY PRECAUTIONS W/ASSISTANCE/DEVICE Description: STG Adhere to Safety Precautions With cues Assistance/Device. Outcome: Progressing

## 2023-10-06 NOTE — Plan of Care (Signed)
  Problem: Consults Goal: RH SPINAL CORD INJURY PATIENT EDUCATION Description:  See Patient Education module for education specifics.  Outcome: Progressing   Problem: SCI BOWEL ELIMINATION Goal: RH STG MANAGE BOWEL WITH ASSISTANCE Description: STG Manage Bowel with mod I Assistance. Outcome: Progressing   Problem: SCI BLADDER ELIMINATION Goal: RH STG MANAGE BLADDER WITH ASSISTANCE Description: STG Manage Bladder With mod I Assistance Outcome: Progressing   Problem: RH SAFETY Goal: RH STG ADHERE TO SAFETY PRECAUTIONS W/ASSISTANCE/DEVICE Description: STG Adhere to Safety Precautions With cues Assistance/Device. Outcome: Progressing

## 2023-10-06 NOTE — Progress Notes (Addendum)
 DC lidocaine and multivitamin order per PA. Incision remains intact, no signs or symptoms of infection. [place] J collar on when OOB, abdominal and ted hose as well. Continues therapies throughout the day. Tolerated PO medications with no issues. Soft Collar on when in bed. Hematuria noted to urine and blood to meatus-PA aware. No new orders. Fluid restrictions followed per Md order Reacher within reach, call bell within reach.  Tilden Dome, LPN

## 2023-10-06 NOTE — Progress Notes (Signed)
 Occupational Therapy Session Note  Patient Details  Name: Brandon Watts MRN: [id number] Date of Birth: 08-31-1945  Today's Date: 10/06/2023 OT Individual Time: 0700-0810 OT Individual Time Calculation (min): 70 min    Short Term Goals: Week 3:  OT Short Term Goal 1 (Week 3): STG=LTG 2/2 ELOS  Skilled Therapeutic Interventions/Progress Updates:   Pt standing EOB with wife present to complete dressing tasks prior to OTA entering room. All amb throughout unit with Rollator at supervision level. Initial focus on SciFit while standing 45 secs x 2 in opposite directions with focus on increased AROM and strengthening. Amb with RW to day room for continued LUE tasks and standing balance activities. Ongoing DME conversations. Ongoing discharge planning. Pt returned to room and sat in w/c with all needs within reach.  Therapy Documentation Precautions:  Precautions Precautions: Fall, Cervical Precaution/Restrictions Comments: Foley Required Braces or Orthoses: Cervical Brace Cervical Brace: Hard collar, Soft collar Restrictions Weight Bearing Restrictions Per Provider Order: No Pain: Pt c/o LUE/shoulder (deltoid) and scapulae; soft tissue massage/trigger point with relief noted.   Therapy/Group: Individual Therapy  [name] 10/06/2023, 8:15 AM

## 2023-10-06 NOTE — Progress Notes (Signed)
 Occupational Therapy Session Note  Patient Details  Name: Brandon Watts MRN: [id number] Date of Birth: [birth year]  Today's Date: 10/06/2023 OT Individual Time: 1340-1435 OT Individual Time Calculation (min): 55 min    Short Term Goals: Week 3:  OT Short Term Goal 1 (Week 3): STG=LTG 2/2 ELOS  Skilled Therapeutic Interventions/Progress Updates:    OT intervention with focus on LUE strengthening/function, standing balance, DME recommendations, activity tolerance, and safety awareness to increase independence with BADLs. Supine>sit EOB with supervision. Amb with Rollator throughout unit with supervision. Initial standing activity using LUE for corn hole with emphasis on swing through and standing balance. Pt repeated activity while standing on AirEx (CGA). Pt amb with Rollator to gym and practiced bouncing/dribbling basketball with LUE. Pt able to apply addequate pressure with LUE to complete task. LUE fatigued quickly. Pt practiced handwriting with/without adaptive foam tubing. Pt pleased with quality of handwriting. Discussed DME recommendations and pt's wish list of items not covered by insureance. Discussed use of RW at top of stairs at home and use of Rollator on main floor. Pt returned to room and sat EOB with wife present.   Therapy Documentation Precautions:  Precautions Precautions: Fall, Cervical Precaution/Restrictions Comments: Foley Required Braces or Orthoses: Cervical Brace Cervical Brace: Hard collar, Soft collar Restrictions Weight Bearing Restrictions Per Provider Order: No Pain:  Pt reports LUE/shoulder soreness; rest and repositioning   Therapy/Group: Individual Therapy  [name] 10/06/2023, 2:57 PM

## 2023-10-06 NOTE — Progress Notes (Signed)
 Education provided to wife Brandon Watts) on how to perform foley care. Brandon Watts demonstrated foley care as this nurse walked her through step by step. Wife verbalized understanding of purpose of foley care. Notified oncoming nurse of education provided. Nursing to continue educating wife and patient prior to discharge.  Tilden Dome, LPN

## 2023-10-06 NOTE — Progress Notes (Signed)
 Physical Therapy Session Note  Patient Details  Name: Brandon Watts MRN: [id number] Date of Birth: [birth year]  Today's Date: 10/06/2023 PT Individual Time: 0850-1005 PT Individual Time Calculation (min): 75 min   Short Term Goals: Week 3:  PT Short Term Goal 1 (Week 3): =LTGs d/t ELOS  Skilled Therapeutic Interventions/Progress Updates: Pt presented in w/c agreeable to therapy. Pt c/o L shoulder pain nsg present to administer pain meds and pt acknowledging that performed a lot of LUE activities during OT session. Session therefore focused on functional mobility, balance, and community integration. Pt donned shoes with set up assist and was able to tie laces independently. Pt then used rollator to ambulate down to [facility] entrance and on patio. In patio pt ambulated to heart and vascular entrance ambulating on brick and concrete with supervision. Pt then ambulated down long slope to stairs. After taking a brief seated rest on bench pt descended and ascended x 4 steps with 1 rail and close supervision. PTA lowered rollator to lower level and pt ambulated to [facility] entrance partially ambulating on grass with CGA. Pt managed ambulating on bricks at [facility] entrance with supervision which pt stated was similar to his home entry. While in elevator to return to unit pt stating urgency for BM. Pt ambulated back to room once on 4th floow with pt completing toilet transfer and clothing management with supervision. Pt with small bout of incontince due to urgency however was able to empty bowels on toilet. Pt also got a small amount of stool on ace bandages therefore PTA changed top ace bandage and completed peri-care total A. Pt was able to stand throughout peri-care and required minA to pull pants over hips. Pt then ambulated to sink and completed hand hygiene in standing at sink. After brief rest pt ambulated to day room with supervision and participated in dynamic balance on compliant surface including standing on  airex while receiving moderate perturbations at hips and shoulders. Pt also performed step ups to 8in step while standing on Airex x 10 bilaterally requiring CGA. Pt then ambulated to main gym and participated in gait in parallel bars without AD. Pt noted to maintain good balance therefore performed x 2 laps ~164ft each including L turns without AD and CGA. Pt with x 1 near LOB however pt was able to recover with CGA. Pt also participated in weaving through cones with CGA ~25ft x 4 with safe navigation. Pt returned to room at end of session and completed transfer to bed with supervision. Pt left in bed with call bell within reach and needs met.      Therapy Documentation Precautions:  Precautions Precautions: Fall, Cervical Precaution/Restrictions Comments: Foley Required Braces or Orthoses: Cervical Brace Cervical Brace: Hard collar, Soft collar Restrictions Weight Bearing Restrictions Per Provider Order: No General:   Vital Signs: Therapy Vitals Temp: 98.1 F (36.7 C) Pulse Rate: 64 Resp: 17 BP: 118/80 Patient Position (if appropriate): Sitting Oxygen Therapy SpO2: 97 % O2 Device: Room Air Pain:   Mobility:   Locomotion :    Trunk/Postural Assessment :    Balance:   Exercises:   Other Treatments:      Therapy/Group: Individual Therapy  Brandon Watts 10/06/2023, 3:40 PM

## 2023-10-06 NOTE — Plan of Care (Signed)
  Problem: Consults Goal: RH SPINAL CORD INJURY PATIENT EDUCATION Description:  See Patient Education module for education specifics.  Outcome: Progressing   Problem: SCI BOWEL ELIMINATION Goal: RH STG MANAGE BOWEL WITH ASSISTANCE Description: STG Manage Bowel with mod I Assistance. Outcome: Progressing Goal: RH STG SCI MANAGE BOWEL WITH MEDICATION WITH ASSISTANCE Description: STG SCI Manage bowel with medication with mod I assistance. Outcome: Progressing   Problem: SCI BLADDER ELIMINATION Goal: RH STG MANAGE BLADDER WITH ASSISTANCE Description: STG Manage Bladder With mod I Assistance Outcome: Progressing Goal: RH STG MANAGE BLADDER WITH MEDICATION WITH ASSISTANCE Description: STG Manage Bladder With Medication With mod I Assistance. Outcome: Progressing   Problem: RH SAFETY Goal: RH STG ADHERE TO SAFETY PRECAUTIONS W/ASSISTANCE/DEVICE Description: STG Adhere to Safety Precautions With cues Assistance/Device. Outcome: Progressing   Problem: RH PAIN MANAGEMENT Goal: RH STG PAIN MANAGED AT OR BELOW PT'S PAIN GOAL Description: < 4 with prns Outcome: Progressing   Problem: RH BLADDER ELIMINATION Goal: RH STG MANAGE BLADDER WITH ASSISTANCE Description: STG Manage Bladder With Assistance Outcome: Progressing

## 2023-10-06 NOTE — Progress Notes (Signed)
 PROGRESS NOTE   Subjective/Complaints:  We discussed that they aren't going to plan on son helping, but will come in begrudgingly for family training.  Feels better about d/c date.  L shoulder really bothering him this AM, but hasn't gotten pain meds yet- doing OT- "can grin and bear it".   Said had OH when did long walk yesterday and went up long flight of stairs- had to stop multiple times with sitting rest break, but now knows when feels bad before passing out. ' After estim, gets more action.  Of LUE/. Said tolerated sleeping flat in bed, so thinks will be ok without hospital bed.     ROS:    Pt denies SOB, abd pain, CP, N/V/C/D, and vision changes   Still having Orthostatic hypotension intermittently Objective:   No results found.   Recent Labs    10/04/23 0611  WBC 7.2  HGB 12.8*  HCT 38.2*  PLT 202    Recent Labs    10/04/23 0611  NA 140  K 4.3  CL 106  CO2 26  GLUCOSE 90  BUN 16  CREATININE 0.89  CALCIUM 10.7*     Intake/Output Summary (Last 24 hours) at 10/06/2023 1211 Last data filed at 10/06/2023 0800 Gross per 24 hour  Intake 736 ml  Output 1500 ml  Net -764 ml        Physical Exam: Vital Signs Blood pressure (!) 147/86, pulse (!) 58, temperature 97.7 F (36.5 C), resp. rate 18, height 5\' 8"  (1.727 m), weight 69.2 kg, SpO2 98%.     General: awake, alert, appropriate, standing doing UB strengthening, then sat on Rolator seat with OT in small gym; NAD HENT: conjugate gaze; oropharynx moist CV: regular rate and rhythm; no JVD Pulmonary: CTA B/L; no W/R/R- good air movement GI: soft, NT, ND, (+)BS- normoactive Psychiatric: appropriate Neurological: Ox3  Neurologic exam:  Cognition: AAO x3 Language: Fluent, Mild dysarthria.   Insight: Fair insight into current condition.    Prior exam:  3 beats clonus in LE's- no increased tone seen MSK: Very TTP with hyperalgesia over L  deltoid and shoulder laterally- NO change today. 1/2" sublux of shoulder--tender with PROM RUE- Biceps 4-/5; WE 4/5; Triceps 4-/5; Grip 3+ to 4-/5, FA 3+/5 LUE_ Biceps 2+/5; WE 3+/5; Triceps 2+/5; Grip 3/5; and FA 3/5 less  TTP over R anterior shoulder with now no pain with resisted flexion- t  Skin: C/D/I. No apparent lesions.IV intact    Sensation: To light touch hypersensitive in LUE at shoulder  Reflexes :Negative Hoffman's and babinski signs bilaterally.  CN: 2-12 grossly intact.  Coordination: No apparent tremors. Weakness limits FNF on left ; + Mild LLE HTS Spasticity: MAS 0 in all extremities. Flaccid LUE. C/W today's exam 10/06/2023.       Assessment/Plan: 1. Functional deficits which require 3+ hours per day of interdisciplinary therapy in a comprehensive inpatient rehab setting. Physiatrist is providing close team supervision and 24 hour management of active medical problems listed below. Physiatrist and rehab team continue to assess barriers to discharge/monitor patient progress toward functional and medical goals  Care Tool:  Bathing    Body parts bathed by patient: Left  arm, Chest, Abdomen, Front perineal area, Buttocks, Right upper leg, Left upper leg, Face, Right arm   Body parts bathed by helper: Right lower leg, Left lower leg Body parts n/a: Right lower leg, Left lower leg   Bathing assist Assist Level: Contact Guard/Touching assist     Upper Body Dressing/Undressing Upper body dressing   What is the patient wearing?: Pull over shirt    Upper body assist Assist Level: Moderate Assistance - Patient 50 - 74%    Lower Body Dressing/Undressing Lower body dressing      What is the patient wearing?: Pants, Underwear/pull up     Lower body assist Assist for lower body dressing: Moderate Assistance - Patient 50 - 74%     Toileting Toileting    Toileting assist Assist for toileting: Total Assistance - Patient < 25%     Transfers Chair/bed  transfer  Transfers assist     Chair/bed transfer assist level: Contact Guard/Touching assist     Locomotion Ambulation   Ambulation assist   Ambulation activity did not occur: Safety/medical concerns (2/2 endurance deficit/ limited activity tolerance)  Assist level: Contact Guard/Touching assist Assistive device: Rollator Max distance: 1000   Walk 10 feet activity   Assist  Walk 10 feet activity did not occur: Safety/medical concerns (2/2 endurance deficit/ limited activity tolerance)  Assist level: Supervision/Verbal cueing Assistive device: Rollator   Walk 50 feet activity   Assist Walk 50 feet with 2 turns activity did not occur: Safety/medical concerns (2/2 endurance deficit/ limited activity tolerance)  Assist level: Supervision/Verbal cueing Assistive device: Rollator    Walk 150 feet activity   Assist Walk 150 feet activity did not occur: Safety/medical concerns (2/2 endurance deficit/ limited activity tolerance)  Assist level: Contact Guard/Touching assist Assistive device: Rollator    Walk 10 feet on uneven surface  activity   Assist Walk 10 feet on uneven surfaces activity did not occur: Safety/medical concerns (d/t limited activity tolerance and imbalance)   Assist level: Contact Guard/Touching assist Assistive device: Rollator   Wheelchair     Assist Is the patient using a wheelchair?: No Type of Wheelchair: Manual Wheelchair activity did not occur: N/A  Wheelchair assist level: Dependent - Patient 0%      Wheelchair 50 feet with 2 turns activity    Assist    Wheelchair 50 feet with 2 turns activity did not occur: N/A       Wheelchair 150 feet activity     Assist  Wheelchair 150 feet activity did not occur: N/A       Blood pressure (!) 147/86, pulse (!) 58, temperature 97.7 F (36.5 C), resp. rate 18, height 5\' 8"  (1.727 m), weight 69.2 kg, SpO2 98%.   Medical Problem List and Plan: 1. Functional deficits  due to SCI (central cord syndrome)- C4 ASIA C/D (incomplete quadriplegia)  with C4 fracture s/p arthrodesis C3-4 anterior interbody technique discectomy for decompression of spinal cord and exiting nerve roots with foraminotomies as well as additional level C4-5 interbody technique for decompression placement of anterior instrumentation consisting of interbody plate and screws C3-4-5 09/09/2023.  Cervical collar at all times             -patient may  shower             -ELOS/Goals: 18 to 21 days, min assist PT/OT/SLP                            -On  admission Dr. Maisie Fus cleared patient for soft collar when in bed for comfort; ortho tech order placed for soft collar and nursing order for soft collar in bed, alternating to hard collar when OOB   - will allow dog to come in from home with papers  - not ready for grounds pass quite yet  Started estim D/c 3/31 Pt getting results from estim- will buy one for home Con't CIR PT, OT and SLP- d/c still 3/31 -d/w Dr Hall Busing Neuro-urology- who sees many of my SCI patients- he will fit pt in in next 2-4 weeks after d/c  -pt doesn't want hospital bed- which is fine 2.  Antithrombotics: -DVT/anticoagulation/segmental LUL PE:  Pharmaceutical: Lovenox initiated 09/11/2023 and titrated.  Neurosurgery Dr. Maisie Fus stating can switch to DOAC 3/7, since last hemoglobin 3/5 will wait until a.m. labs and switch if stable 3/8- pt refused labs this AM- will wait til Monday when have labs 3/12- Switched to Eliquis 5 mg BID from Tx dose lovenox  3/20- will need for 3 months total- went over that increased risk of DVT/PE is for a total of 3 months for since has 2 PE's             -antiplatelet therapy: N/A   3. Pain Management: OxyContin sustained release 10 mg every 12 hours, Neurontin 300 mg 3 times daily Robaxin as needed.  Will add tramadol as needed   3/8- pt reports pain usually controlled- con't regimen  3/10--still having a lot of LUE--increase gabapentin to 400mg   tid  3/11- change Tylenol to 1000 mg q8 hours per pt request  3/12- has been helpful to add tylenol  3/17- has tramadol- not really using much- last used 3/14  3/18- 3/19 still has hyperalgesia- of LUE but we discussed using other nerve pain meds- but wait since they will cause urinary retention- doing desensitization techniques  -also refused MRI of L shoulder since "hates MRI machines"   3/20- will add Baclofen 5 mg TID prn for spasms and stop Robaxin  3/22-23 baclofen seems effective for spasms 4. Mood/Behavior/Sleep/Hallucinations: Melatonin 5 mg nightly.  Xanax Dced on admission per family d/t confusion.              - antipsychotic agents: Seroquel 12.5-25 mg PRN              - Delirium precautions with stimulation during the daytime, quiet with low lights an minimal interruptions at nighttime, limit lines/tubes/drains to essential access only, limit physical and chemical restraints               - Melatonin 5 mg at bedtime, trazodone 25-50 mg PRN.  - May benefit from standing at bedtime seroquel if trazodone ineffective.    3/8- slept OK- will con't regimen for now  3/10 continues to struggle with sleep, partially due to pain    -gabapentin increased above   -will schedule trazodone @2100  with back up dose prn       -pt voiced some feeling of depression over his accident/ condition -he is interested in speaking with neuropsychology. -will ask Dr. Kieth Brightly to see patient regarding reactive depression. 3/140- added Seroquel yesterday for psychomotor agitation- will increase to 25 mg at bedtime tonight  3/17- sleeping great- feels more confused/delayed this AM- don't see a medicine that could cause this 3/18- off Trazodone due to urinary issues- admits very sad- but doesn't want to add meds! 3/19- Pt refuses SSRI for mood 5. Neuropsych/cognition/small SDH: This patient is not  completely capable of making decisions on his own behalf.              - telesitter continued --renewed daily     3/20- got off Telesitter 6. Skin/Wound Care: Routine skin checks 7. Fluids/Electrolytes/Nutrition/hyponatremia: Routine in and outs with follow-up chemistries    Latest Ref Rng & Units 10/04/2023    6:11 AM 09/30/2023    6:11 AM 09/27/2023    5:40 AM  BMP  Glucose 70 - 99 mg/dL 90  97  782   BUN 8 - 23 mg/dL 16  17  28    Creatinine 0.61 - 1.24 mg/dL 9.56  2.13  0.86   Sodium 135 - 145 mmol/L 140  140  136   Potassium 3.5 - 5.1 mmol/L 4.3  4.1  4.3   Chloride 98 - 111 mmol/L 106  106  104   CO2 22 - 32 mmol/L 26  28  28    Calcium 8.9 - 10.3 mg/dL 57.8  46.9  62.9     8.  Dysphagia. Cortrak placed 3/5.   Currently on a mechanical soft diet with MBS pending Advance diet as tolerated per speech therapy              - On TF with 50 ml/hr osmolite 1.5 + prosource 60 ml daily              - Dietary consulted for management   3/8- will add IVFs, pt insistent- for dehydration- refused labs today so cannot see Cr/BUN to assess- but says not drinking enough  3/10--changed ivf to free water thru tube--encourage PO as well  **update 4:30pm--pt cleared for D2/thins per SLP!   -RD transitioning to nocturnal feeds beginning at 0500 tomorrow  3/11- fed self breakfast this Am and dinner last night for first time  3/12- con't cortrak  3/13- Got OK by Dietician to remove Cortrak and is on D3 thin diet, so no need for steroids- and would probably do better if remove Cortrak- will remove today and monitor  3/14- doing ok without Cortrak  3/18- Is dry- BUN 28- will have pt push fluids  3/23 pushing po, labs again Monday  3/24 BUN/Cr stable at 16/0.89 9.  Hypertension with Orthostatic hypotension.  Cozaar 50 mg daily.  Monitor with increased mobility.              - Holding home hydrochlorothiazide    3/8- BP 110s- a little soft- monitor for Orthostatic hypotension  3/11- will decrease Cozaar to 25 mg daily and add Midodrine 2.5 mg TID with meals- to start at 6:30am since almost passed out 2x per PT.    3/12- BP dropped to 58 systolic yesterday-added Midodrine- BP is slightly elevated in supine, but dropping dramatically in spite of TEDs and ACE wraps  Vitals:   10/05/23 1943 10/06/23 0506  BP: 128/62 (!) 147/86  Pulse: 73 (!) 58  Resp: 16 18  Temp: 98.1 F (36.7 C) 97.7 F (36.5 C)  SpO2: 96% 98%  Patient still on losartan will discontinue. Have also reduced midodrine dose. Off scopolamine which can also lower blood pressure  3/17- BP still soft supine in bed- have ordered Orthostatics by nursing- and will see if need to increase Midodrine again, due to soft BP with laying down  3/18- will increase Midodrine to 5 mg TID since BP dropped to 70's yesterday and almost passed out. 3/19- BP lying down 140s yesterday but better this AM-  3/20- Pt and I  agree a little Permissive elevated BP supine is better than dropping BP in therapy- will increase Midodrine ot compensate for Low BP form Flomax starting.   3/23 another significant spell yesterday while he had two prolonged ambulation experiences before and after without issue  -may be the timing of midodrine.   -also now on flomax which might be exacerbating  -continue with teds/binder etc, push fluids  -in the short term should avoid prolonged ambulation/upright activities when it is longer than 3 hours since last midodrine dose 3/24 Continues to have dizziness with orthostatic hypotension during therapy.  Consider discontinue Flomax 3/25- will d/c Flomax since still lightheaded with walking- esp since has Foley in for now 3/26- will d/c Urecholine- since still dizzy- since has foley- is understanding more of pre syncopal sx's- so catching it earlier- sitting more frequently.  10.  BPH with likely neurogenic bladder.  Proscar 5 mg nightly.  Check PVR             - Has coude catheter placed 2/27, patient hesitant to remove, OK to keep tonight and would re-address in AM - states he needs to be able to stand to urinate d/t BPH   3/8-  explained can wait 2 days, but then have to remove foley-   3/10---remove foley--voiding trial  3/12- Requiring caths- no real voiding so far - will add Urecholine, because OH makes it difficult to add Flomax. Add 5 mg TID for now- monitor  3/13- will wait on Flomax- until BP doing better- bladder scans and caths up to 1000cc- needs to do every 4 hours based on cath results-d/w nursing 3/14- wrote order for coude' caths only and lidocaine jelly with caths  Midodrine is an alpha-blocker which also may be affecting bladder outflow, will start weaning today.  Scopolamine has anticholinergic effects which can also reduce bladder emptying and has been discontinued.  Certainly his urinary issues may be related to his spinal cord injury but will try to minimize pharmaceutical effects of his meds 3/17- they placed Foley this weekend due to trauma- will keep 3-4 days then d/c.   3/18- will d/c foley Thursday AM since placed Sunday night-for trauma- pt said "happy for break from cathing"  3/19- will remove Foley in AM/Thursday since don't want pt getting UTI 3/20- will d/c Foley today- wil add Flomax and increase Midodrine to help combat the [facility] will get from Flomax.  3/22-23 foley replaced d/t retention/hematuria--primary team can reassess this coming week.   -urine remains clear/yellow -continue flomax (see discussion #9) 3/24 continue Foley 3/25- pt doesn't want to remove Foley before leaves- since still bleeding from meatus- pt aware has risk of UTI with foley, but he's very concerned about trauma- which I agree- called Dr Hall Busing- Neuro-Urology about seeing pt- he will see in the next 2-3 weeks per Dr Katrinka Blazing 3/26- will d/c Urecholine-  11.  Constipation with likely neurogenic bowel.   No BM since admission               - Miralax 17 g BID, sennakot S 2 tabs BID              - PRN sorbitol for moderate constipation, PRN fleet enema for severe constipation   3/8- will give 45 cc Sorbitol and if no  results, Soap suds enema this evening  3/10- bm's continent yesterday--type 6--no changes to regimen today  3/11- Strained but not able to go this AM- will montior  3/12- LBM yesterday per  pt- but 3/9 per chart- will check with therapy- if no results, will need more intervention- already on Miralax BID, Senna 2 tabs BID  3/26- LBM yesterday 12.  Acute respiratory failure with hypoxia/bilateral pneumonia.  Continue cefepime x 7 days.  Pulmonary services have signed off.              - Ongoing significant secretions with frequent suctioning              - Robitussin 5 mg Q6H             - Biotene PRN   3/8- running Cefepime now- con't 7 days total  3/9- CXR looks good- only mild L sided atelectasis - added Scopolamine due to increased secretions for a few days only  3/12- changed Duonebs back to QID from prn  3/13- improved secretions with Scopolamine - this may be contributing to retention no issues with secretions consider d/c  3/17- Scopolamine stopped  3/20- still using O2 at night- 2L- want sot have at home- knows insurance won't cover it- but wants for airhunger- still using ICS regularly.   13. L shoulder weakness and pain and new R shoulder pain.  No fracture on xray 2/26.              - Likely due to L C4 myelopathy; however no accompanying sensory deficit and no other weakness. I suspect a radicular component also  - Monitor with therapies; would consider MRI shoulder for rotator cuff pathology if no improvement   3/13- Pt's main pain is anterior and upper trap on L side- will con't to monitor- doesn't want steroid injections at this time- R shoulder pain started overnight from pulling in bed- appears to be biceps tendinitis- wants to wait on steroids due to concern with hallucinations  3/14- ended up doing steroid injection with 20mg  of kenalog- pain doing better  3/19- pt refused L shoulder MRI  3/21- pt was discussing a magnesium spray- which is fine to try for L shoulder,  Trap pain  with substitution for delt weakness, add sportscream  -3/22-23 have discussed left shoulder subluxation--encouraged him to keep arm elevated on pillows to prevent worsening  14. Restlessness/psychomotor agitation  3/13- will try Seroquel 12.5 mg at bedtime- d/w TBI MD and she agreed it's better than Depakote.   3/14- increasing seroquel to 25 mg QHS  Sleeping ok, now on seroquel , d/c trazodone, anticholinergic effect  15. 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    I spent a total of  43  minutes on total care today- >50% coordination of care- due to  Prolonged d/w pt about hospital bed; foley- teaching wife and pt about foley care- and pain and OH  LOS: 19 days A FACE TO FACE EVALUATION WAS PERFORMED  Temeca Somma 10/06/2023, 12:11 PM

## 2023-10-07 LAB — COMPREHENSIVE METABOLIC PANEL WITH GFR
ALT: 18 U/L (ref 0–44)
AST: 16 U/L (ref 15–41)
Albumin: 3.2 g/dL — ABNORMAL LOW (ref 3.5–5.0)
Alkaline Phosphatase: 59 U/L (ref 38–126)
Anion gap: 7 (ref 5–15)
BUN: 18 mg/dL (ref 8–23)
CO2: 28 mmol/L (ref 22–32)
Calcium: 10.7 mg/dL — ABNORMAL HIGH (ref 8.9–10.3)
Chloride: 105 mmol/L (ref 98–111)
Creatinine, Ser: 0.95 mg/dL (ref 0.61–1.24)
GFR, Estimated: >60 mL/min (ref >60)
Glucose, Bld: 90 mg/dL (ref 70–99)
Potassium: 4 mmol/L (ref 3.5–5.1)
Sodium: 140 mmol/L (ref 135–145)
Total Bilirubin: 1.3 mg/dL — ABNORMAL HIGH (ref 0.0–1.2)
Total Protein: 5.9 g/dL — ABNORMAL LOW (ref 6.5–8.1)

## 2023-10-07 NOTE — Progress Notes (Signed)
 Physical Therapy Session Note  Patient Details  Name: Brandon Watts MRN: [id number] Date of Birth: 12-May-1946  Today's Date: 10/07/2023 PT Individual Time: 1000-1103 PT Individual Time Calculation (min): 63 min   Short Term Goals: Week 3:  PT Short Term Goal 1 (Week 3): =LTGs d/t ELOS  Skilled Therapeutic Interventions/Progress Updates:    pt received in bed and agreeable to therapy. Pt reports 4/10 pain in shoulders and periscapular musculature. Addressed with manual therapy/triggerpoint release in supine, targeting upper traps and scalenes BIL. Pt reports pain down to 2/10 after trigger point release. Also recommended meditation/visualization for relaxation and pain relief. Pt and wife with questions about massage therapy. Discussed this can be appropriate with an experienced practioner, avoiding the surgical site and maintaining precautions. Both expressed understanding. Pt ambulated throughout session with supervision-CGA with rollator. Discussed gait mechanics in socks, with pt demoing good ankle stability despite less support and good foot clearance throughout. Pt then navigated 2 x 11 steps with CGA and R hand rail, using largely alternating technique. Pt paused to allow someone to pass on stairs, and reports that he felt onset of dizziness after standing still. Discussed muscle pump encouraging venous return. Symptoms resolved quickly after seated rest break. Pt returned to room and remained in w/c with wife and visitor present.       Therapy Documentation Precautions:  Precautions Precautions: Fall, Cervical Precaution/Restrictions Comments: Foley Required Braces or Orthoses: Cervical Brace Cervical Brace: Hard collar, Soft collar Restrictions Weight Bearing Restrictions Per Provider Order: No General:     Therapy/Group: Individual Therapy  Juluis Rainier 10/07/2023, 11:11 AM

## 2023-10-07 NOTE — Progress Notes (Signed)
 Patient ID: Brandon Watts, male   DOB: [birth year], 78 y.o.   MRN: [id number]  SW ordered DME with [employer]- 3in1 BSC,TTB, and rollator.   Cecile Sheerer, MSW, LCSW Office: [phone] Cell: [phone] Fax: [phone]

## 2023-10-07 NOTE — Progress Notes (Signed)
 Occupational Therapy Session Note  Patient Details  Name: MCCLAIN SHALL MRN: [id number] Date of Birth: 11-29-1945  Today's Date: 10/07/2023 OT Individual Time: 1300-1345 OT Individual Time Calculation (min): 45 min    Short Term Goals: Week 3:  OT Short Term Goal 1 (Week 3): STG=LTG 2/2 ELOS  Skilled Therapeutic Interventions/Progress Updates:    OT intervention with focus on LUE functional reaching, functional amb with Rollator, and safety awareness to increase independence with BADLs. BITS activities sitting and using drum stick for basic reaching task to target. Pt required assistance at elbow when reaching to screen. Closed chain reaching seated at table/mat for joint mobilizations. LUE biceps curls with body weight without recruitment (~100 degree elbow flexion). Saebo on biceps with slight improvement. Will use Saebo again tomorrow for task. Ongoing discharge planning and safety awareness/recommendations during community outings. Pt returned to room and reamained seated in w/c with wife present.   Therapy Documentation Precautions:  Precautions Precautions: Fall, Cervical Precaution/Restrictions Comments: Foley Required Braces or Orthoses: Cervical Brace Cervical Brace: Hard collar, Soft collar Restrictions Weight Bearing Restrictions Per Provider Order: No   Pain:  Pt reports ongoing LUE deltoid soreness/discomfort and bil upper traps and scapulae; trigger point and soft tissue massage   Therapy/Group: Individual Therapy  [name] 10/07/2023, 2:00 PM

## 2023-10-07 NOTE — Progress Notes (Signed)
 Occupational Therapy Session Note  Patient Details  Name: Brandon Watts MRN: [id number] Date of Birth: [birth year]  Today's Date: 10/07/2023 OT Individual Time: 0700-0810 OT Individual Time Calculation (min): 70 min  Unattended e-stim (Saebo) at end of session  Short Term Goals: Week 3:  OT Short Term Goal 1 (Week 3): STG=LTG 2/2 ELOS  Skilled Therapeutic Interventions/Progress Updates:    OT intervention with focus on functional amb with RW, LUE function, general conditioning, BUE stretching, standing balance, and placement of Saebo for increased LUE function to increase pt's independence with BADLs.   Amb throughout session with Rollator at supervision. Initial focus on BUE stretching while standing at hi-lo table with increased shoulder flexion. General conditioning on NuStep (5 mins level 5) with lightheadedness reported (BP 110/55). Amb with Rollator to gym for standing activities at bounce back-toss basketball on compliant/noncompliant surface. Pt required CGA when standing on AirEx.   Saebo applied to LUE deltoids at end of session. OTA removed at end of cycle. No adverse reactions.  Saebo Stim One 330 pulse width 35 Hz pulse rate On 8 sec/ off 8 sec Ramp up/ down 2 sec Symmetrical Biphasic wave form  Max intensity at 500 Ohm load   Therapy Documentation Precautions:  Precautions Precautions: Fall, Cervical Precaution/Restrictions Comments: Foley Required Braces or Orthoses: Cervical Brace Cervical Brace: Hard collar, Soft collar Restrictions Weight Bearing Restrictions Per Provider Order: No    Pain: Pain Assessment Pain Scale: 0-10 Pain Score: 4  Pain Location: Shoulder Trigger point and soft tissue massage with some relief noted  Therapy/Group: Individual Therapy  [name] 10/07/2023, 8:17 AM

## 2023-10-07 NOTE — Plan of Care (Addendum)
  Problem: RH Swallowing Goal: LTG Patient will consume least restrictive diet using compensatory strategies with assistance (SLP) Description: LTG:  Patient will consume least restrictive diet using compensatory strategies with assistance (SLP) m Outcome: modI Completed/Met Goal: LTG Patient will participate in dysphagia therapy to increase swallow function with assistance (SLP) Description: LTG:  Patient will participate in dysphagia therapy to increase swallow function with assistance (SLP) Outcome: modI Completed/Met   Problem: RH Problem Solving Goal: LTG Patient will demonstrate problem solving for (SLP) Description: LTG:  Patient will demonstrate problem solving for basic/complex daily situations with cues  (SLP) Outcome: modI Completed/Met    Problem: RH Pre-functional/Other (Specify) Goal: RH LTG SLP (Specify) 1 Description: RH LTG SLP (Specify) 1 Outcome: modI Completed/Met   Problem: RH Memory Goal: LTG Patient will demonstrate ability for day to day (SLP) Description: LTG:   Patient will demonstrate ability for day to day recall/carryover during cognitive/linguistic activities with assist  (SLP) Outcome: modI Completed/Met

## 2023-10-07 NOTE — Progress Notes (Signed)
 Recreational Therapy Session Note  Patient Details  Name: MASSIMO HARTLAND MRN: [id number] Date of Birth: Apr 24, 1946 Today's Date: 10/07/2023  Pain: c/o of tightness-working with PT  Skilled Therapeutic Interventions/Progress Updates: Met with pt briefly during PT session to discuss team recommendation for patient participation in community reintegration.  Discussed the purpose of an outing and potential goals.  Pt is agreeable to participate in an outing tomorrow morning.     Tamryn Popko 10/07/2023, 11:19 AM

## 2023-10-07 NOTE — Progress Notes (Signed)
 Speech Language Pathology Discharge Summary  Patient Details  Name: Brandon Watts MRN: [id number] Date of Birth: [birth year]  Date of Discharge from SLP service:October 07, 2023  Today's Date: 10/07/2023 SLP Individual Time: 1415-1445 SLP Individual Time Calculation (min): 30 min   Skilled Therapeutic Interventions: Pt and his wife were greeted in his room. He was awake/alert upon SLP arrival and was very cooperative throughout tx tasks targeting dysphagia and cognition. He completed a complex organization, attention, and processing task independently. Additionally, he demonstrated adequate reasoning and awareness during conversation re remaining slight pharyngeal dysphagia. He also continues to recall all compensatory strategies independently and completes his pharyngeal strengthening exercises daily. He has met all ST goals at this time and skilled ST is no longer warranted. He was left in his chair with his wife and LPN present. See d/c summary below for more info.   Patient has met 5 of 5 long term goals.  Patient to discharge at overall Modified Independent level.  Reasons goals not met: n/a all goals met   Clinical Impression/Discharge Summary: Pt has made excellent progress this stay as evidenced by improved pharyngeal dysphagia and return to cognitive baseline. Very slight oropharyngeal dysphagia remains, as expected, given pre vertebral edema s/p ACDF. However, he completes provided exercises and utilizes all compensatory swallow strategies independently. He demonstrates an overall safe and efficient diet to tolerate regular textures/thin liquids, however, requested to stay on Dys 3 textures at this time given pre cut bites. He completes all cognitive tasks @ modI and demonstrates adequate cognitive-linguistic skills to return to prev roles/responsibilities. Skilled ST no longer warranted.  Care Partner:  Caregiver Able to Provide Assistance: Yes  Type of Caregiver Assistance:  Physical  Recommendation:  Other (comment) (GI consult as desired given baseline esophageal symptoms) No further ST needed     Equipment: n/a   Reasons for discharge: Treatment goals met   Patient/Family Agrees with Progress Made and Goals Achieved: Yes    [name] 10/07/2023, 4:41 PM

## 2023-10-07 NOTE — Progress Notes (Signed)
 PROGRESS NOTE   Subjective/Complaints:  Pt reports tolerating sleeping completely flat with pillows.   Got a little lightheaded this AM with using stationary bike.  Wants to use NSAIDs on the Eliquis- I explained, as he knows, we cannot mix them or dramatic increase risk of bleeding, esp with his small SDH  Also explained stopped Flomax as well as Urecholine since has foley.    ROS:    Pt denies SOB, abd pain, CP, N/V/C/D, and vision changes     Still having Orthostatic hypotension intermittently Objective:   No results found.   No results for input(s): "WBC", "HGB", "HCT", "PLT" in the last 72 hours.   Recent Labs    10/07/23 0609  NA 140  K 4.0  CL 105  CO2 28  GLUCOSE 90  BUN 18  CREATININE 0.95  CALCIUM 10.7*     Intake/Output Summary (Last 24 hours) at 10/07/2023 0947 Last data filed at 10/07/2023 0533 Gross per 24 hour  Intake 476 ml  Output 2250 ml  Net -1774 ml        Physical Exam: Vital Signs Blood pressure (!) 141/80, pulse (!) 56, temperature 97.6 F (36.4 C), resp. rate 12, height 5\' 8"  (1.727 m), weight 69.2 kg, SpO2 96%.      General: awake, alert, appropriate, supine in bed; wife in room; OT just left; NAD HENT: conjugate gaze; oropharynx moist CV: regular rhythm, bradycardic rate; no JVD Pulmonary: CTA B/L; no W/R/R- good air movement GI: soft, NT, ND, (+)BS Psychiatric: appropriate- interactive-  Neurological: Ox3 but needed reminders on a old friend's name and that Eliquis cannot be taken with NSAID RUE- 5-/5 throughout LUE- Biceps 3+/5; Triceps 4-/5; WE 4-/5; Grip 4/5 and FA 4/5 LE's 5-/5 throughout   Neurologic exam:  Cognition: AAO x3 Language: Fluent, Mild dysarthria.   Insight: Fair insight into current condition.    Prior exam:  3 beats clonus in LE's- no increased tone seen MSK: Very TTP with hyperalgesia over L deltoid and shoulder laterally- NO change  today. 1/2" sublux of shoulder--tender with PROM RUE- Biceps 4-/5; WE 4/5; Triceps 4-/5; Grip 3+ to 4-/5, FA 3+/5 LUE_ Biceps 2+/5; WE 3+/5; Triceps 2+/5; Grip 3/5; and FA 3/5 less  TTP over R anterior shoulder with now no pain with resisted flexion- t  Skin: C/D/I. No apparent lesions.IV intact    Sensation: To light touch hypersensitive in LUE at shoulder  Reflexes :Negative Hoffman's and babinski signs bilaterally.  CN: 2-12 grossly intact.  Coordination: No apparent tremors. Weakness limits FNF on left ; + Mild LLE HTS Spasticity: MAS 0 in all extremities. Flaccid LUE. C/W today's exam 10/07/2023.       Assessment/Plan: 1. Functional deficits which require 3+ hours per day of interdisciplinary therapy in a comprehensive inpatient rehab setting. Physiatrist is providing close team supervision and 24 hour management of active medical problems listed below. Physiatrist and rehab team continue to assess barriers to discharge/monitor patient progress toward functional and medical goals  Care Tool:  Bathing    Body parts bathed by patient: Left arm, Chest, Abdomen, Front perineal area, Buttocks, Right upper leg, Left upper leg, Face, Right arm  Body parts bathed by helper: Right lower leg, Left lower leg Body parts n/a: Right lower leg, Left lower leg   Bathing assist Assist Level: Contact Guard/Touching assist     Upper Body Dressing/Undressing Upper body dressing   What is the patient wearing?: Pull over shirt    Upper body assist Assist Level: Moderate Assistance - Patient 50 - 74%    Lower Body Dressing/Undressing Lower body dressing      What is the patient wearing?: Pants, Underwear/pull up     Lower body assist Assist for lower body dressing: Moderate Assistance - Patient 50 - 74%     Toileting Toileting    Toileting assist Assist for toileting: Total Assistance - Patient < 25%     Transfers Chair/bed transfer  Transfers assist     Chair/bed transfer  assist level: Contact Guard/Touching assist     Locomotion Ambulation   Ambulation assist   Ambulation activity did not occur: Safety/medical concerns (2/2 endurance deficit/ limited activity tolerance)  Assist level: Contact Guard/Touching assist Assistive device: Rollator Max distance: 1000   Walk 10 feet activity   Assist  Walk 10 feet activity did not occur: Safety/medical concerns (2/2 endurance deficit/ limited activity tolerance)  Assist level: Supervision/Verbal cueing Assistive device: Rollator   Walk 50 feet activity   Assist Walk 50 feet with 2 turns activity did not occur: Safety/medical concerns (2/2 endurance deficit/ limited activity tolerance)  Assist level: Supervision/Verbal cueing Assistive device: Rollator    Walk 150 feet activity   Assist Walk 150 feet activity did not occur: Safety/medical concerns (2/2 endurance deficit/ limited activity tolerance)  Assist level: Contact Guard/Touching assist Assistive device: Rollator    Walk 10 feet on uneven surface  activity   Assist Walk 10 feet on uneven surfaces activity did not occur: Safety/medical concerns (d/t limited activity tolerance and imbalance)   Assist level: Contact Guard/Touching assist Assistive device: Rollator   Wheelchair     Assist Is the patient using a wheelchair?: No Type of Wheelchair: Manual Wheelchair activity did not occur: N/A  Wheelchair assist level: Dependent - Patient 0%      Wheelchair 50 feet with 2 turns activity    Assist    Wheelchair 50 feet with 2 turns activity did not occur: N/A       Wheelchair 150 feet activity     Assist  Wheelchair 150 feet activity did not occur: N/A       Blood pressure (!) 141/80, pulse (!) 56, temperature 97.6 F (36.4 C), resp. rate 12, height 5\' 8"  (1.727 m), weight 69.2 kg, SpO2 96%.   Medical Problem List and Plan: 1. Functional deficits due to SCI (central cord syndrome)- C4 ASIA C/D  (incomplete quadriplegia)  with C4 fracture s/p arthrodesis C3-4 anterior interbody technique discectomy for decompression of spinal cord and exiting nerve roots with foraminotomies as well as additional level C4-5 interbody technique for decompression placement of anterior instrumentation consisting of interbody plate and screws C3-4-5 09/09/2023.  Cervical collar at all times             -patient may  shower             -ELOS/Goals: 18 to 21 days, min assist PT/OT/SLP                            -On admission Dr. Maisie Fus cleared patient for soft collar when in bed for comfort; ortho tech order placed for  soft collar and nursing order for soft collar in bed, alternating to hard collar when OOB   - will allow dog to come in from home with papers  - not ready for grounds pass quite yet  Started estim D/c 3/31 Pt getting results from estim- will buy one for home -d/w Dr Hall Busing Neuro[facility]- who sees many of my SCI patients- he will fit pt in in next 2-4 weeks after d/c Con't CIR PT, and OT and SLP Won't send home with hospital bed 2.  Antithrombotics: -DVT/anticoagulation/segmental LUL PE:  Pharmaceutical: Lovenox initiated 09/11/2023 and titrated.  Neurosurgery Dr. Maisie Fus stating can switch to DOAC 3/7, since last hemoglobin 3/5 will wait until a.m. labs and switch if stable 3/8- pt refused labs this AM- will wait til Monday when have labs 3/12- Switched to Eliquis 5 mg BID from Tx dose lovenox  3/20- will need for 3 months total- went over that increased risk of DVT/PE is for a total of 3 months for since has 2 Pe's 3/27- explained cannot take NSAIDs as well as Eliquis, but can use Voltaren gel- until off Eliquis- esp in setting of SDH             -antiplatelet therapy: N/A   3. Pain Management: OxyContin sustained release 10 mg every 12 hours, Neurontin 300 mg 3 times daily Robaxin as needed.  Will add tramadol as needed   3/8- pt reports pain usually controlled- con't regimen  3/10--still  having a lot of LUE--increase gabapentin to 400mg  tid  3/11- change Tylenol to 1000 mg q8 hours per pt request  3/12- has been helpful to add tylenol  3/17- has tramadol- not really using much- last used 3/14  3/18- 3/19 still has hyperalgesia- of LUE but we discussed using other nerve pain meds- but wait since they will cause urinary retention- doing desensitization techniques  -also refused MRI of L shoulder since "hates MRI machines"   3/20- will add Baclofen 5 mg TID prn for spasms and stop Robaxin  3/22-23 baclofen seems effective for spasms 4. Mood/Behavior/Sleep/Hallucinations: Melatonin 5 mg nightly.  Xanax Dced on admission per family d/t confusion.              - antipsychotic agents: Seroquel 12.5-25 mg PRN              - Delirium precautions with stimulation during the daytime, quiet with low lights an minimal interruptions at nighttime, limit lines/tubes/drains to essential access only, limit physical and chemical restraints               - Melatonin 5 mg at bedtime, trazodone 25-50 mg PRN.  - May benefit from standing at bedtime seroquel if trazodone ineffective.    3/8- slept OK- will con't regimen for now  3/10 continues to struggle with sleep, partially due to pain    -gabapentin increased above   -will schedule trazodone @2100  with back up dose prn       -pt voiced some feeling of depression over his accident/ condition -he is interested in speaking with neuropsychology. -will ask Dr. Kieth Brightly to see patient regarding reactive depression. 3/140- added Seroquel yesterday for psychomotor agitation- will increase to 25 mg at bedtime tonight  3/17- sleeping great- feels more confused/delayed this AM- don't see a medicine that could cause this 3/18- off Trazodone due to urinary issues- admits very sad- but doesn't want to add meds! 3/19- Pt refuses SSRI for mood 5. Neuropsych/cognition/small SDH: This patient is not completely  capable of making decisions on his own behalf.               - telesitter continued --renewed daily    3/20- got off Telesitter 6. Skin/Wound Care: Routine skin checks 7. Fluids/Electrolytes/Nutrition/hyponatremia: Routine in and outs with follow-up chemistries    Latest Ref Rng & Units 10/07/2023    6:09 AM 10/04/2023    6:11 AM 09/30/2023    6:11 AM  BMP  Glucose 70 - 99 mg/dL 90  90  97   BUN 8 - 23 mg/dL 18  16  17    Creatinine 0.61 - 1.24 mg/dL 1.61  0.96  0.45   Sodium 135 - 145 mmol/L 140  140  140   Potassium 3.5 - 5.1 mmol/L 4.0  4.3  4.1   Chloride 98 - 111 mmol/L 105  106  106   CO2 22 - 32 mmol/L 28  26  28    Calcium 8.9 - 10.3 mg/dL 40.9  81.1  91.4     8.  Dysphagia. Cortrak placed 3/5.   Currently on a mechanical soft diet with MBS pending Advance diet as tolerated per speech therapy              - On TF with 50 ml/hr osmolite 1.5 + prosource 60 ml daily              - Dietary consulted for management   3/8- will add IVFs, pt insistent- for dehydration- refused labs today so cannot see Cr/BUN to assess- but says not drinking enough  3/10--changed ivf to free water thru tube--encourage PO as well  **update 4:30pm--pt cleared for D2/thins per SLP!   -RD transitioning to nocturnal feeds beginning at 0500 tomorrow  3/11- fed self breakfast this Am and dinner last night for first time  3/12- con't cortrak  3/13- Got OK by Dietician to remove Cortrak and is on D3 thin diet, so no need for steroids- and would probably do better if remove Cortrak- will remove today and monitor  3/14- doing ok without Cortrak  3/18- Is dry- BUN 28- will have pt push fluids  3/23 pushing po, labs again Monday  3/24 BUN/Cr stable at 16/0.89  3/27- BUN 18 and Cr 0.95 9.  Hypertension with Orthostatic hypotension.  Cozaar 50 mg daily.  Monitor with increased mobility.              - Holding home hydrochlorothiazide    3/8- BP 110s- a little soft- monitor for Orthostatic hypotension  3/11- will decrease Cozaar to 25 mg daily and add Midodrine 2.5  mg TID with meals- to start at 6:30am since almost passed out 2x per PT.   3/12- BP dropped to 58 systolic yesterday-added Midodrine- BP is slightly elevated in supine, but dropping dramatically in spite of TEDs and ACE wraps  Vitals:   10/07/23 0521 10/07/23 0833  BP: 113/64 (!) 141/80  Pulse: (!) 56 (!) 56  Resp: 16 12  Temp: 98.4 F (36.9 C) 97.6 F (36.4 C)  SpO2: 97% 96%  Patient still on losartan will discontinue. 3/20- Pt and I agree a little Permissive elevated BP supine is better than dropping BP in therapy- will increase Midodrine ot compensate for Low BP form Flomax starting.   3/23 another significant spell yesterday while he had two prolonged ambulation experiences before and after without issue  -may be the timing of midodrine.   -also now on flomax which might be exacerbating  -continue with teds/binder  etc, push fluids  -in the short term should avoid prolonged ambulation/upright activities when it is longer than 3 hours since last midodrine dose 3/24 Continues to have dizziness with orthostatic hypotension during therapy.  Consider discontinue Flomax 3/25- will d/c Flomax since still lightheaded with walking- esp since has Foley in for now 3/26- will d/c Urecholine- since still dizzy- since has foley- is understanding more of pre syncopal sx's- so catching it earlier- sitting more frequently. 3/27- still having episodes of lightheadedness, but catching it and sitting down- takes longer to occur  10.  BPH with likely neurogenic bladder.  Proscar 5 mg nightly.  Check PVR             - Has coude catheter placed 2/27, patient hesitant to remove, OK to keep tonight and would re-address in AM - states he needs to be able to stand to urinate d/t BPH   3/8- explained can wait 2 days, but then have to remove foley-   3/10---remove foley--voiding trial  3/12- Requiring caths- no real voiding so far - will add Urecholine, because OH makes it difficult to add Flomax. Add 5 mg TID for  now- monitor  3/13- will wait on Flomax- until BP doing better- bladder scans and caths up to 1000cc- needs to do every 4 hours based on cath results-d/w nursing 3/14- wrote order for coude' caths only and lidocaine jelly with caths  Midodrine is an alpha-blocker which also may be affecting bladder outflow, will start weaning today.  Scopolamine has anticholinergic effects which can also reduce bladder emptying and has been discontinued.  Certainly his urinary issues may be related to his spinal cord injury but will try to minimize pharmaceutical effects of his meds 3/17- they placed Foley this weekend due to trauma- will keep 3-4 days then d/c.   3/18- will d/c foley Thursday AM since placed Sunday night-for trauma- pt said "happy for break from cathing"  3/19- will remove Foley in AM/Thursday since don't want pt getting UTI 3/20- will d/c Foley today- wil add Flomax and increase Midodrine to help combat the [facility] will get from Flomax.  3/22-23 foley replaced d/t retention/hematuria--primary team can reassess this coming week.   -urine remains clear/yellow -continue flomax (see discussion #9) 3/24 continue Foley 3/25- pt doesn't want to remove Foley before leaves- since still bleeding from meatus- pt aware has risk of UTI with foley, but he's very concerned about trauma- which I agree- called Dr Hall Busing- Neuro-Urology about seeing pt- he will see in the next 2-3 weeks per Dr Katrinka Blazing 3/26- will d/c Urecholine-  11.  Constipation with likely neurogenic bowel.   No BM since admission               - Miralax 17 g BID, sennakot S 2 tabs BID              - PRN sorbitol for moderate constipation, PRN fleet enema for severe constipation   3/8- will give 45 cc Sorbitol and if no results, Soap suds enema this evening  3/10- bm's continent yesterday--type 6--no changes to regimen today  3/11- Strained but not able to go this AM- will montior  3/12- LBM yesterday per pt- but 3/9 per chart- will check with  therapy- if no results, will need more intervention- already on Miralax BID, Senna 2 tabs BID  3/26- LBM yesterday 12.  Acute respiratory failure with hypoxia/bilateral pneumonia.  Continue cefepime x 7 days.  Pulmonary services have signed off.              -  Ongoing significant secretions with frequent suctioning              - Robitussin 5 mg Q6H             - Biotene PRN   3/8- running Cefepime now- con't 7 days total  3/9- CXR looks good- only mild L sided atelectasis - added Scopolamine due to increased secretions for a few days only  3/12- changed Duonebs back to QID from prn  3/13- improved secretions with Scopolamine - this may be contributing to retention no issues with secretions consider d/c  3/17- Scopolamine stopped  3/20- still using O2 at night- 2L- want sot have at home- knows insurance won't cover it- but wants for airhunger- still using ICS regularly.   13. L shoulder weakness and pain and new R shoulder pain.  No fracture on xray 2/26.              - Likely due to L C4 myelopathy; however no accompanying sensory deficit and no other weakness. I suspect a radicular component also  - Monitor with therapies; would consider MRI shoulder for rotator cuff pathology if no improvement   3/13- Pt's main pain is anterior and upper trap on L side- will con't to monitor- doesn't want steroid injections at this time- R shoulder pain started overnight from pulling in bed- appears to be biceps tendinitis- wants to wait on steroids due to concern with hallucinations  3/14- ended up doing steroid injection with 20mg  of kenalog- pain doing better  3/19- pt refused L shoulder MRI  3/21- pt was discussing a magnesium spray- which is fine to try for L shoulder,  Trap pain with substitution for delt weakness, add sportscream  -3/22-23 have discussed left shoulder subluxation--encouraged him to keep arm elevated on pillows to prevent worsening  14. Restlessness/psychomotor agitation  3/13- will  try Seroquel 12.5 mg at bedtime- d/w TBI MD and she agreed it's better than Depakote.   3/14- increasing seroquel to 25 mg QHS  Sleeping ok, now on seroquel , d/c trazodone, anticholinergic effect  15. 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    I spent a total of 36   minutes on total care today- >50% coordination of care- due to  Prolonged time in room discussing bladder/foley, Urology- as well as OH- and lack of need for hospital bed.    LOS: 20 days A FACE TO FACE EVALUATION WAS PERFORMED  Yasmin Dibello 10/07/2023, 9:47 AM

## 2023-10-08 NOTE — Progress Notes (Signed)
 Patient ID: Brandon Watts, male   DOB: [birth year], 78 y.o.   MRN: [id number]  SW faxed outpatient PT/OT referral to [facility].  Cecile Sheerer, MSW, LCSW Office: [phone] Cell: [phone] Fax: [phone]

## 2023-10-08 NOTE — Progress Notes (Addendum)
 Occupational Therapy Discharge Summary  Patient Details  Name: Brandon Watts MRN: [id number] Date of Birth: [birth year]  Date of Discharge from OT service:October 10, 2023  Patient has met 11 of 11 long term goals due to improved activity tolerance, improved balance, postural control, ability to compensate for deficits, functional use of  RIGHT upper, RIGHT lower, LEFT upper, and LEFT lower extremity, improved attention, improved awareness, and improved coordination.  Pt made steady progress with BADLs and functional transfers during this admission. Pt requires min A for bathing/dressing tasks and toileting. All functional transfers with supervision. Pt uses RUE independently and LUE at diminished level. Pt provided handout for UE FMC and GM tasks/exercises. Pt provided info on Saebo for ongoing estim for LUE deltoids and biceps. Pt reports he has already ordered one. Pt's wife has been present throughout therapy sessions and has demonstated appropriate level of assistance/ supervision. Pt pleased with progress and ready for discharge home.Patient to discharge at [facility] Assist level.  Patient's care partner is independent to provide the necessary physical assistance at discharge.    Reasons goals not met: n/a  Recommendation:  Patient will benefit from ongoing skilled OT services in outpatient setting to continue to advance functional skills in the area of BADL, iADL, and Reduce care partner burden.  Equipment: BSC, TTB  Reasons for discharge: treatment goals met and discharge from hospital  Patient/family agrees with progress made and goals achieved: Yes  OT Discharge ADL ADL Equipment Provided: Long-handled sponge Eating: Modified independent Where Assessed-Eating: Chair Grooming: Modified independent Where Assessed-Grooming: Sitting at sink Upper Body Bathing: Minimal assistance Where Assessed-Upper Body Bathing: Shower Lower Body Bathing: Supervision with use of grab  bar Where Assessed-Lower Body Bathing: Shower Upper Body Dressing: Minimal assistance Where Assessed-Upper Body Dressing: Edge of bed Lower Body Dressing: Minimal assistance Where Assessed-Lower Body Dressing: Standing at sink, Edge of bed Toileting: Supervision/safety Where Assessed-Toileting: Toilet, Psychiatrist Transfer: Mod I  Toilet Transfer Method: Proofreader: Gaffer: Close supervison Web designer Method: Ship broker: Insurance underwriter: Close supervision Film/video editor Method: Designer, industrial/product: Sales promotion account executive Baseline Vision/History: 3 Glaucoma Patient Visual Report: No change from baseline Vision Assessment?: No apparent visual deficits Perception  Perception: Within Functional Limits Praxis Praxis: WFL Cognition Cognition Overall Cognitive Status: Within Functional Limits for tasks assessed Arousal/Alertness: Awake/alert Orientation Level: Person;Place;Situation Memory: Appears intact Attention: Selective Sustained Attention: Appears intact Selective Attention: Appears intact Awareness: Appears intact Problem Solving: Appears intact Safety/Judgment: Appears intact Brief Interview for Mental Status (BIMS) Repetition of Three Words (First Attempt): 3 Temporal Orientation: Year: Correct Temporal Orientation: Month: Accurate within 5 days Temporal Orientation: Day: Correct Recall: "Sock": No, could not recall Recall: "Blue": Yes, no cue required Recall: "Bed": Yes, no cue required BIMS Summary Score: 13 Sensation Sensation Light Touch: Impaired Detail Peripheral sensation comments: BUE tingling and numbness LUE>RUE Light Touch Impaired Details: Impaired RUE;Impaired LUE Hot/Cold: Appears Intact Proprioception: Appears Intact Stereognosis: Impaired by gross assessment Coordination Gross Motor Movements  are Fluid and Coordinated: Yes Fine Motor Movements are Fluid and Coordinated: No Finger Nose Finger Test: Decreased FMC/dexterity in L-hand. 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 Mobility  Transfers Sit to Stand: Independent with assistive device Stand to Sit: Independent with assistive device  Trunk/Postural Assessment  Cervical Assessment Cervical Assessment: Exceptions to [facility] (cervical collar) Thoracic Assessment Thoracic Assessment:  Exceptions to [facility] (cervical precautions) Lumbar Assessment Lumbar Assessment:  (posterior pelvic tilt) Postural Control Righting Reactions: Delayed  Balance Balance Balance Assessed: Yes Static Sitting Balance Static Sitting - Balance Support: Feet supported Static Sitting - Level of Assistance: 6: Modified independent (Device/Increase time) Dynamic Sitting Balance Dynamic Sitting - Balance Support: During functional activity Dynamic Sitting - Level of Assistance: 6: Modified independent (Device/Increase time) Static Standing Balance Static Standing - Balance Support: During functional activity;Bilateral upper extremity supported Static Standing - Level of Assistance: 6: Modified independent (Device/Increase time) Dynamic Standing Balance Dynamic Standing - Balance Support: Bilateral upper extremity supported;During functional activity Dynamic Standing - Level of Assistance: 6: Modified independent (Device/Increase time) Extremity/Trunk Assessment RUE Assessment RUE Assessment: Within Functional Limits General Strength Comments: grossly 4/5 LUE Assessment LUE Assessment: Exceptions to [facility] Passive Range of Motion (PROM) Comments: WFL PROM Active Range of Motion (AROM) Comments: shoulder abduction/flexion~35 degrees General Strength Comments: 2+/5 proximal; 3/5 distal   Prepared by: [name] 10/08/2023, 2:12  PM  Signed by: Lou Cal, OTR/L, MSOT 10/10/23

## 2023-10-08 NOTE — Progress Notes (Signed)
 Physical Therapy Session Note  Patient Details  Name: Brandon Watts MRN: [id number] Date of Birth: Sep 30, 1945  Today's Date: 10/08/2023 PT Individual Time: 0800-0830, 1035-1201, [id number] PT Individual Time Calculation (min): 30 min, 86 min,  Short Term Goals: Week 3:  PT Short Term Goal 1 (Week 3): =LTGs d/t ELOS  Skilled Therapeutic Interventions/Progress Updates:     Session 1: Pt received supine in bed and agreeable to therapy session. Pt notes increased shoulder hyperalgesia sx compared to normal, offered to contact nsg to inquire about pain medication but pt denied - offered rest breaks as needed for pain management. Had discussion about furniture placement in home to allow for seated rest breaks as needed.   Pt ambulated to therapy gym without AD and SBA for obstacle course: all CGA without AD, used BHR with stair portion - x1 over 5 hurdles (varying height between 6-11"), across cobble foam, up and down stairs, and up red bolster as ramp onto curb step - x2 timed over 5 hurdles spaced close together, across cobble foam, across airex balance beam, up 6" stairs and down 3" stairs with airex and floor mat placed on top for compliant downward slope, up red boslter ramp onto curb step. First trial 1:09, second trial 40s, no LOB but pt had significantly more difficulty with airex balance beam  Pt ambulated back to room with SBA and no AD, left supine in bed with all needs in reach and wife present.    Session 2: Pt received seated EOB and agreeable to therapy session simulating community environments d/t having to cancel outing. Pt has no c/o pain at this time.   Pt ambulates down to lobby of hospital with rollator and supervision, has one minor self corrected LOB when elevator starts moving, verbal cues to lock rollator while in elevator and lean against the wall.   Pt able to navigate public restroom stall including emptying leg bag from standing and sit<>stand from low  toilet with grab bar with SBA. Discussed options for safely navigating bathroom and emptying leg bag if larger handicap stall is not accessible.  Pt ambulated outside ~500' without AD and SBA over ramp and across uneven terrain consisting of grass, fine gravel, sticks/leaves/acorn - pt occasionally slowed down and moved arms to mid-high guard position for balance but experienced no LOB. When asked to pick up something off ground, pt noted 3/10 dizziness which abated after seated rest break. Pt able to ambulate the rest of the way inside through [facility] hospital entrance, navigating ~12 steps and multiple surface changes without AD and SBA.   Pt participated in community reintegration cognitive dual task activity in cafeteria, tasked with finding a meal meeting certain requirements under $8 with rollator and SBA. Pt required occasional verbal cues but this was more so d/t pt not wearing reading glasses. Pt took long standing rest break while chatting with friends in the cafeteria, no sx of dizziness after prolonged standing without movement.   Pt ambulated back up to 4th floor via elevator with rollator and supervision. Discussed common gym equipment that he would be safe to use (NuStep, recumbent bike) and exercises he can do at home (repeated step ups to 1st step on stair). Discussed technique and indications/contraindications for floor transfer before pt performed x2 floor transfers with CGA fading to close supervision. On second attempt, pt able to lower to the floor from standing without UE support. Pt ambulated back to room with rollator and supervision and left seated EOB with  all needs in reach and wife present.    Session 3:  Pt received seated EOB with wife and son present for family education. Pt has no c/o pain at this time but notes he is fatigued after this AM sessions.   Discussed home measurements with son, who is most concerned about pt navigating 9" curb step on porch.   Pt ambulates with  rollator and supervision to stairwell for stair training: - 2x11 stairs with L HR and SBA ascending, CGA descending d/t decreased LUE strength and pt having difficulty finding the arm rail at first d/t height and cervical collar, verbal cues for eccentric control and going at pace of person who is guarding while descending. Second trial with son guarding.  Pt ambulates with rollator and supervision to therapy gym and performs x2 8"curb step with R HHA and CGA. Pt feels confident he will be able to navigate this at home and practices with son guarding as well. Pt ambulates with rollator and supervision back to room and left seated EOB with son and wife.   Therapy Documentation Precautions:  Precautions Precautions: Fall, Cervical Precaution/Restrictions Comments: Foley Required Braces or Orthoses: Cervical Brace Cervical Brace: Hard collar, Soft collar Restrictions Weight Bearing Restrictions Per Provider Order: No General:      Therapy/Group: Individual Therapy  [name] 10/08/2023, 12:18 PM

## 2023-10-08 NOTE — Progress Notes (Signed)
 Occupational Therapy Session Note  Patient Details  Name: Brandon Watts MRN: [id number] Date of Birth: 1945-08-14  Today's Date: 10/09/2023 OT Individual Time: 2841-3244 OT Individual Time Calculation (min): 40 min   Short Term Goals: Week 3:  OT Short Term Goal 1 (Week 3): STG=LTG 2/2 ELOS  Skilled Therapeutic Interventions/Progress Updates:  Pt received sitting EOB, finishing grooming with spouse's assistance. Pt reports 4-5/10 pain in B-shoulders, LPN made aware. Pt performs all transfers with distant supervision using rollator. In day room, moist heat applied to painful upper back/shoulders. Pt then instructed in series of BUE ROM exercises in supine to manage stiffness and promote joint mobility. Including shoulder flexion/extension, internal/external rotation, and protraction/retraction with use of 1# dowel bar. HEP assigned, cuing for safe pacing. Pt remained resting at EOB, spouse present.   Later on in PM, patient and spouse requesting Saebo (e-stim) for stiffness/pain management. OT placed Saebo on medial portion of L-shoulder. See parameters below for unattended modality.   Saebo Stim One 330 pulse width 35 Hz pulse rate On 8 sec/ off 8 sec Ramp up/ down 2 sec Symmetrical Biphasic wave form  Max intensity at 500 Ohm load   Therapy Documentation Precautions:  Precautions Precautions: Fall, Cervical Precaution/Restrictions Comments: Foley Required Braces or Orthoses: Cervical Brace Cervical Brace: Hard collar, Soft collar Restrictions Weight Bearing Restrictions Per Provider Order: No   Therapy/Group: Individual Therapy  Lou Cal, OTR/L, MSOT  10/09/2023, 8:48 AM

## 2023-10-08 NOTE — Progress Notes (Signed)
 PROGRESS NOTE   Subjective/Complaints:  Pt reports sleeping well flat.  BP doing better- gets signals and less often.  Can now empty leg bag on his own.  LBM yesterday.  Walked today with no Assistive device.  Getting biceps and deltoid estim by OT Stiff pain and hyperalgesia in both Ue's this AM.  Had to cancel outing today.  Wants high hat not BSC.    ROS:    Pt denies SOB, abd pain, CP, N/V/C/D, and vision changes     Still having Orthostatic hypotension intermittently Objective:   No results found.   No results for input(s): "WBC", "HGB", "HCT", "PLT" in the last 72 hours.   Recent Labs    10/07/23 0609  NA 140  K 4.0  CL 105  CO2 28  GLUCOSE 90  BUN 18  CREATININE 0.95  CALCIUM 10.7*     Intake/Output Summary (Last 24 hours) at 10/08/2023 1801 Last data filed at 10/08/2023 0529 Gross per 24 hour  Intake 118 ml  Output 1250 ml  Net -1132 ml        Physical Exam: Vital Signs Blood pressure 123/69, pulse (!) 59, temperature 98.9 F (37.2 C), temperature source Oral, resp. rate 17, height 5\' 8"  (1.727 m), weight 69.2 kg, SpO2 95%.       General: awake, alert, appropriate, walked to elevators with no AD with OT; NAD HENT: conjugate gaze; oropharynx moist- wearing hard collar CV: regular rate and rhythm; no JVD Pulmonary: CTA B/L; no W/R/R- good air movement GI: soft, NT, ND, (+)BS Psychiatric: appropriate Neurological: alert  RUE- 5-/5 throughout LUE- Biceps 3+/5; Triceps 4-/5; WE 4-/5; Grip 4/5 and FA 4/5 LE's 5-/5 throughout   Neurologic exam:  Cognition: AAO x3 Language: Fluent, Mild dysarthria.   Insight: Fair insight into current condition.    Prior exam:  3 beats clonus in LE's- no increased tone seen MSK: Very TTP with hyperalgesia over L deltoid and shoulder laterally- NO change today. 1/2" sublux of shoulder--tender with PROM RUE- Biceps 4-/5; WE 4/5; Triceps 4-/5;  Grip 3+ to 4-/5, FA 3+/5 LUE_ Biceps 2+/5; WE 3+/5; Triceps 2+/5; Grip 3/5; and FA 3/5 less  TTP over R anterior shoulder with now no pain with resisted flexion- t  Skin: C/D/I. No apparent lesions.IV intact    Sensation: To light touch hypersensitive in LUE at shoulder  Reflexes :Negative Hoffman's and babinski signs bilaterally.  CN: 2-12 grossly intact.  Coordination: No apparent tremors. Weakness limits FNF on left ; + Mild LLE HTS Spasticity: MAS 0 in all extremities. Flaccid LUE. C/W today's exam 10/08/2023.       Assessment/Plan: 1. Functional deficits which require 3+ hours per day of interdisciplinary therapy in a comprehensive inpatient rehab setting. Physiatrist is providing close team supervision and 24 hour management of active medical problems listed below. Physiatrist and rehab team continue to assess barriers to discharge/monitor patient progress toward functional and medical goals  Care Tool:  Bathing    Body parts bathed by patient: Left arm, Chest, Abdomen, Front perineal area, Buttocks, Right upper leg, Left upper leg, Face, Right arm   Body parts bathed by helper: Right arm Body parts n/a:  Right lower leg, Left lower leg   Bathing assist Assist Level: Contact Guard/Touching assist     Upper Body Dressing/Undressing Upper body dressing   What is the patient wearing?: Pull over shirt    Upper body assist Assist Level: Minimal Assistance - Patient > 75%    Lower Body Dressing/Undressing Lower body dressing      What is the patient wearing?: Pants, Underwear/pull up     Lower body assist Assist for lower body dressing: Minimal Assistance - Patient > 75%     Toileting Toileting    Toileting assist Assist for toileting: Minimal Assistance - Patient > 75%     Transfers Chair/bed transfer  Transfers assist     Chair/bed transfer assist level: Contact Guard/Touching assist     Locomotion Ambulation   Ambulation assist   Ambulation  activity did not occur: Safety/medical concerns (2/2 endurance deficit/ limited activity tolerance)  Assist level: Contact Guard/Touching assist Assistive device: Rollator Max distance: 1000   Walk 10 feet activity   Assist  Walk 10 feet activity did not occur: Safety/medical concerns (2/2 endurance deficit/ limited activity tolerance)  Assist level: Supervision/Verbal cueing Assistive device: Rollator   Walk 50 feet activity   Assist Walk 50 feet with 2 turns activity did not occur: Safety/medical concerns (2/2 endurance deficit/ limited activity tolerance)  Assist level: Supervision/Verbal cueing Assistive device: Rollator    Walk 150 feet activity   Assist Walk 150 feet activity did not occur: Safety/medical concerns (2/2 endurance deficit/ limited activity tolerance)  Assist level: Contact Guard/Touching assist Assistive device: Rollator    Walk 10 feet on uneven surface  activity   Assist Walk 10 feet on uneven surfaces activity did not occur: Safety/medical concerns (d/t limited activity tolerance and imbalance)   Assist level: Contact Guard/Touching assist Assistive device: Rollator   Wheelchair     Assist Is the patient using a wheelchair?: No Type of Wheelchair: Manual Wheelchair activity did not occur: N/A  Wheelchair assist level: Dependent - Patient 0%      Wheelchair 50 feet with 2 turns activity    Assist    Wheelchair 50 feet with 2 turns activity did not occur: N/A       Wheelchair 150 feet activity     Assist  Wheelchair 150 feet activity did not occur: N/A       Blood pressure 123/69, pulse (!) 59, temperature 98.9 F (37.2 C), temperature source Oral, resp. rate 17, height 5\' 8"  (1.727 m), weight 69.2 kg, SpO2 95%.   Medical Problem List and Plan: 1. Functional deficits due to SCI (central cord syndrome)- C4 ASIA C/D (incomplete quadriplegia)  with C4 fracture s/p arthrodesis C3-4 anterior interbody technique  discectomy for decompression of spinal cord and exiting nerve roots with foraminotomies as well as additional level C4-5 interbody technique for decompression placement of anterior instrumentation consisting of interbody plate and screws C3-4-5 09/09/2023.  Cervical collar at all times             -patient may  shower             -ELOS/Goals: 18 to 21 days, min assist PT/OT/SLP                            -On admission Dr. Maisie Fus cleared patient for soft collar when in bed for comfort; ortho tech order placed for soft collar and nursing order for soft collar in bed, alternating to hard collar  when OOB   - will allow dog to come in from home with papers  - not ready for grounds pass quite yet  Started estim D/c 3/31 Pt getting results from estim- will buy one for home -d/w Dr Hall Busing Neuro[facility]rology- who sees many of my SCI patients- he will fit pt in in next 2-4 weeks after d/c Want shigh hat, not BSC- no hospital bed Con't CIR PT, OT and SLP 2.  Antithrombotics: -DVT/anticoagulation/segmental LUL PE:  Pharmaceutical: Lovenox initiated 09/11/2023 and titrated.  Neurosurgery Dr. Maisie Fus stating can switch to DOAC 3/7, since last hemoglobin 3/5 will wait until a.m. labs and switch if stable 3/8- pt refused labs this AM- will wait til Monday when have labs 3/12- Switched to Eliquis 5 mg BID from Tx dose lovenox  3/20- will need for 3 months total- went over that increased risk of DVT/PE is for a total of 3 months for since has 2 Pe's 3/27- explained cannot take NSAIDs as well as Eliquis, but can use Voltaren gel- until off Eliquis- esp in setting of SDH             -antiplatelet therapy: N/A   3. Pain Management: OxyContin sustained release 10 mg every 12 hours, Neurontin 300 mg 3 times daily Robaxin as needed.  Will add tramadol as needed   3/8- pt reports pain usually controlled- con't regimen  3/10--still having a lot of LUE--increase gabapentin to 400mg  tid  3/11- change Tylenol to 1000 mg q8  hours per pt request  3/12- has been helpful to add tylenol  3/17- has tramadol- not really using much- last used 3/14  3/18- 3/19 still has hyperalgesia- of LUE but we discussed using other nerve pain meds- but wait since they will cause urinary retention- doing desensitization techniques  -also refused MRI of L shoulder since "hates MRI machines"   3/20- will add Baclofen 5 mg TID prn for spasms and stop Robaxin  3/22-23 baclofen seems effective for spasms  3/28- having more hyperalgesia- if hasn't improved by tomorrow, suggest increase in gabapentin to 600 mg TID Saturday- max dose I'd use on him 4. Mood/Behavior/Sleep/Hallucinations: Melatonin 5 mg nightly.  Xanax Dced on admission per family d/t confusion.              - antipsychotic agents: Seroquel 12.5-25 mg PRN              - Delirium precautions with stimulation during the daytime, quiet with low lights an minimal interruptions at nighttime, limit lines/tubes/drains to essential access only, limit physical and chemical restraints               - Melatonin 5 mg at bedtime, trazodone 25-50 mg PRN.  - May benefit from standing at bedtime seroquel if trazodone ineffective.    3/8- slept OK- will con't regimen for now  3/10 continues to struggle with sleep, partially due to pain    -gabapentin increased above   -will schedule trazodone @2100  with back up dose prn       -pt voiced some feeling of depression over his accident/ condition -he is interested in speaking with neuropsychology. -will ask Dr. Kieth Brightly to see patient regarding reactive depression. 3/140- added Seroquel yesterday for psychomotor agitation- will increase to 25 mg at bedtime tonight  3/17- sleeping great- feels more confused/delayed this AM- don't see a medicine that could cause this 3/18- off Trazodone due to urinary issues- admits very sad- but doesn't want to add meds! 3/19- Pt  refuses SSRI for mood 5. Neuropsych/cognition/small SDH: This patient is not  completely capable of making decisions on his own behalf.              - telesitter continued --renewed daily    3/20- got off Telesitter 6. Skin/Wound Care: Routine skin checks 7. Fluids/Electrolytes/Nutrition/hyponatremia: Routine in and outs with follow-up chemistries    Latest Ref Rng & Units 10/07/2023    6:09 AM 10/04/2023    6:11 AM 09/30/2023    6:11 AM  BMP  Glucose 70 - 99 mg/dL 90  90  97   BUN 8 - 23 mg/dL 18  16  17    Creatinine 0.61 - 1.24 mg/dL 4.78  2.95  6.21   Sodium 135 - 145 mmol/L 140  140  140   Potassium 3.5 - 5.1 mmol/L 4.0  4.3  4.1   Chloride 98 - 111 mmol/L 105  106  106   CO2 22 - 32 mmol/L 28  26  28    Calcium 8.9 - 10.3 mg/dL 30.8  65.7  84.6     8.  Dysphagia. Cortrak placed 3/5.   Currently on a mechanical soft diet with MBS pending Advance diet as tolerated per speech therapy              - On TF with 50 ml/hr osmolite 1.5 + prosource 60 ml daily              - Dietary consulted for management   3/8- will add IVFs, pt insistent- for dehydration- refused labs today so cannot see Cr/BUN to assess- but says not drinking enough  3/10--changed ivf to free water thru tube--encourage PO as well  **update 4:30pm--pt cleared for D2/thins per SLP!   -RD transitioning to nocturnal feeds beginning at 0500 tomorrow  3/11- fed self breakfast this Am and dinner last night for first time  3/12- con't cortrak  3/13- Got OK by Dietician to remove Cortrak and is on D3 thin diet, so no need for steroids- and would probably do better if remove Cortrak- will remove today and monitor  3/14- doing ok without Cortrak  3/18- Is dry- BUN 28- will have pt push fluids  3/23 pushing po, labs again Monday  3/24 BUN/Cr stable at 16/0.89  3/27- BUN 18 and Cr 0.95 9.  Hypertension with Orthostatic hypotension.  Cozaar 50 mg daily.  Monitor with increased mobility.              - Holding home hydrochlorothiazide    3/8- BP 110s- a little soft- monitor for Orthostatic  hypotension  3/11- will decrease Cozaar to 25 mg daily and add Midodrine 2.5 mg TID with meals- to start at 6:30am since almost passed out 2x per PT.   3/12- BP dropped to 58 systolic yesterday-added Midodrine- BP is slightly elevated in supine, but dropping dramatically in spite of TEDs and ACE wraps  Vitals:   10/08/23 0523 10/08/23 1609  BP: 111/74 123/69  Pulse: (!) 55 (!) 59  Resp: 17 17  Temp: 97.7 F (36.5 C) 98.9 F (37.2 C)  SpO2: 96% 95%  Patient still on losartan will discontinue. 3/20- Pt and I agree a little Permissive elevated BP supine is better than dropping BP in therapy- will increase Midodrine ot compensate for Low BP form Flomax starting.   3/23 another significant spell yesterday while he had two prolonged ambulation experiences before and after without issue  -may be the timing of midodrine.   -also  now on flomax which might be exacerbating  -continue with teds/binder etc, push fluids  -in the short term should avoid prolonged ambulation/upright activities when it is longer than 3 hours since last midodrine dose 3/24 Continues to have dizziness with orthostatic hypotension during therapy.  Consider discontinue Flomax 3/25- will d/c Flomax since still lightheaded with walking- esp since has Foley in for now 3/26- will d/c Urecholine- since still dizzy- since has foley- is understanding more of pre syncopal sx's- so catching it earlier- sitting more frequently. 3/27-3/28 still having episodes of lightheadedness, but catching it and sitting down- takes longer to occur  10.  BPH with likely neurogenic bladder.  Proscar 5 mg nightly.  Check PVR             - Has coude catheter placed 2/27, patient hesitant to remove, OK to keep tonight and would re-address in AM - states he needs to be able to stand to urinate d/t BPH   3/8- explained can wait 2 days, but then have to remove foley-   3/10---remove foley--voiding trial  3/12- Requiring caths- no real voiding so far - will  add Urecholine, because OH makes it difficult to add Flomax. Add 5 mg TID for now- monitor  3/13- will wait on Flomax- until BP doing better- bladder scans and caths up to 1000cc- needs to do every 4 hours based on cath results-d/w nursing 3/14- wrote order for coude' caths only and lidocaine jelly with caths  Midodrine is an alpha-blocker which also may be affecting bladder outflow, will start weaning today.  Scopolamine has anticholinergic effects which can also reduce bladder emptying and has been discontinued.  Certainly his urinary issues may be related to his spinal cord injury but will try to minimize pharmaceutical effects of his meds 3/17- they placed Foley this weekend due to trauma- will keep 3-4 days then d/c.   3/18- will d/c foley Thursday AM since placed Sunday night-for trauma- pt said "happy for break from cathing"  3/19- will remove Foley in AM/Thursday since don't want pt getting UTI 3/20- will d/c Foley today- wil add Flomax and increase Midodrine to help combat the [facility] will get from Flomax.  3/22-23 foley replaced d/t retention/hematuria--primary team can reassess this coming week.   -urine remains clear/yellow -continue flomax (see discussion #9) 3/24 continue Foley 3/25- pt doesn't want to remove Foley before leaves- since still bleeding from meatus- pt aware has risk of UTI with foley, but he's very concerned about trauma- which I agree- called Dr Hall Busing- Neuro-Urology about seeing pt- he will see in the next 2-3 weeks per Dr Katrinka Blazing 3/26- will d/c Urecholine-  3/28- pt emptying his own leg bag/foley and wife trained on foley care 11.  Constipation with likely neurogenic bowel.   No BM since admission               - Miralax 17 g BID, sennakot S 2 tabs BID              - PRN sorbitol for moderate constipation, PRN fleet enema for severe constipation   3/8- will give 45 cc Sorbitol and if no results, Soap suds enema this evening  3/10- bm's continent yesterday--type 6--no  changes to regimen today  3/11- Strained but not able to go this AM- will montior  3/12- LBM yesterday per pt- but 3/9 per chart- will check with therapy- if no results, will need more intervention- already on Miralax BID, Senna 2 tabs BID  3/28- LBM yesterday 12.  Acute respiratory failure with hypoxia/bilateral pneumonia.  Continue cefepime x 7 days.  Pulmonary services have signed off.              - Ongoing significant secretions with frequent suctioning              - Robitussin 5 mg Q6H             - Biotene PRN   3/8- running Cefepime now- con't 7 days total  3/9- CXR looks good- only mild L sided atelectasis - added Scopolamine due to increased secretions for a few days only  3/12- changed Duonebs back to QID from prn  3/13- improved secretions with Scopolamine - this may be contributing to retention no issues with secretions consider d/c  3/17- Scopolamine stopped  3/20- still using O2 at night- 2L- want sot have at home- knows insurance won't cover it- but wants for airhunger- still using ICS regularly.   13. L shoulder weakness and pain and new R shoulder pain.  No fracture on xray 2/26.              - Likely due to L C4 myelopathy; however no accompanying sensory deficit and no other weakness. I suspect a radicular component also  - Monitor with therapies; would consider MRI shoulder for rotator cuff pathology if no improvement   3/13- Pt's main pain is anterior and upper trap on L side- will con't to monitor- doesn't want steroid injections at this time- R shoulder pain started overnight from pulling in bed- appears to be biceps tendinitis- wants to wait on steroids due to concern with hallucinations  3/14- ended up doing steroid injection with 20mg  of kenalog- pain doing better  3/19- pt refused L shoulder MRI  3/21- pt was discussing a magnesium spray- which is fine to try for L shoulder,  Trap pain with substitution for delt weakness, add sportscream  -3/22-23 have discussed  left shoulder subluxation--encouraged him to keep arm elevated on pillows to prevent worsening  3/28- getting estim of L deltoid and biceps- doing better 14. Restlessness/psychomotor agitation  3/13- will try Seroquel 12.5 mg at bedtime- d/w TBI MD and she agreed it's better than Depakote.   3/14- increasing seroquel to 25 mg QHS  Sleeping ok, now on seroquel , d/c trazodone, anticholinergic effect  15. 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    I spent a total of 37   minutes on total care today- >50% coordination of care- due to  D/w pt about d/c plans; BP/OH; and hyperalgesia- will readdress tomorrow if need be.    LOS: 21 days A FACE TO FACE EVALUATION WAS PERFORMED  Brandon Watts 10/08/2023, 6:01 PM

## 2023-10-08 NOTE — Progress Notes (Signed)
 Occupational Therapy Session Note  Patient Details  Name: Brandon Watts MRN: [id number] Date of Birth: 08-Oct-1945  Today's Date: 10/08/2023 OT Individual Time: 1305-1350 OT Individual Time Calculation (min): 45 min  Unattended estim (1 unit)  Short Term Goals: Week 3:  OT Short Term Goal 1 (Week 3): STG=LTG 2/2 ELOS  Skilled Therapeutic Interventions/Progress Updates:    Pt sitting EOB upon arrival with wife present. Pt's son joined a few minutes later. OT intervention with focus on education and review of recommendations. Reviewed home safety recommendations. Pt and wife verbalized understanding of all recommendations. Pt provided handout of UE FMC and GM tasks/ exercises. Handout reviewed and questions answered. Demonstrated use of theracane with pt reporting relief. Pt to adequately apply sufficient pressure to provide relief. Reviewed use of Saebo (pt ordred for home use) and recommended daily use x 2 for deltoid and biceps. Pt pleased with progress and looking forward to discharge home on 3/31. Pt remained EOB with wife and son present. \\\  Saebo Stim One LUE biceps 330 pulse width 35 Hz pulse rate On 8 sec/ off 8 sec Ramp up/ down 2 sec Symmetrical Biphasic wave form  Max intensity at 500 Ohm load  No adverse reactions noted.   Therapy Documentation Precautions:  Precautions Precautions: Fall, Cervical Precaution/Restrictions Comments: Foley Required Braces or Orthoses: Cervical Brace Cervical Brace: Hard collar, Soft collar Restrictions Weight Bearing Restrictions Per Provider Order: No   Pain: Pt reports bil upper traps, Lt deltoid, and discomfort along media border of scapulae; theracane demonstrated   Therapy/Group: Individual Therapy  [name] 10/08/2023, 1:56 PM

## 2023-10-08 NOTE — Progress Notes (Signed)
 Occupational Therapy Session Note  Patient Details  Name: [name] MRN: [id number] Date of Birth: Jun 16, 1946  Today's Date: 10/08/2023 OT Individual Time: 0700-0745 OT Individual Time Calculation (min): 45 min  Unattended estim (I unit)   Short Term Goals: Week 3:  OT Short Term Goal 1 (Week 3): STG=LTG 2/2 ELOS  Skilled Therapeutic Interventions/Progress Updates:    PT seated EOB eating breakfast upon arrival. Initial focus with amb using Rollator to day room. BUE stretching on hi-lo table. Amb without AD 500'+ with no LOB. Reviewed home safety recommendations and continued discharge planning. Saebo applied to Lt deltoid to facilitate active shoulder abduction. Saebo remained on pt at end of session and completed cycle after return to room.   Saebo Stim One to Lt deltoids (Unattended) 330 pulse width 35 Hz pulse rate On 8 sec/ off 8 sec Ramp up/ down 2 sec Symmetrical Biphasic wave form  Max intensity at 500 Ohm load  Removed at end of cycle. No adverse reactions.   Therapy Documentation Precautions:  Precautions Precautions: Fall, Cervical Precaution/Restrictions Comments: Foley Required Braces or Orthoses: Cervical Brace Cervical Brace: Hard collar, Soft collar Restrictions Weight Bearing Restrictions Per Provider Order: No Pain: Pt reports generalized muscle soreness, BUE hpersensitivity, and LUE deltoid discomfort; MD aware and some meds admin prior to therapy Therapy/Group: Individual Therapy  [name] 10/08/2023, 7:50 AM

## 2023-10-09 NOTE — Progress Notes (Signed)
 PROGRESS NOTE   Subjective/Complaints:  Bradycardic to 51 overnight, otherwise vitals stable. Eating well, LBM 3/28, small.  On bedside exam, patient is surrounded by many family members.  Has no concerns, complaints today.  Pain control is doing fine, last bowel movement 3-28.  States he is getting orthostatic with transfers but he is learning to control this better by sitting for several seconds when he feels presyncopal, until it passes.  Discussed Seroquel nightly; he states he is sleeping well with this medication, does not wish to change at this time.  No further hallucinations or agitation per family.  Did perform EKG today, QTc within normal limits.  ROS:    Pt denies SOB, abd pain, CP, N/V/C/D, and vision changes     Still having Orthostatic hypotension intermittently Objective:   No results found.   No results for input(s): "WBC", "HGB", "HCT", "PLT" in the last 72 hours.   Recent Labs    10/07/23 0609  NA 140  K 4.0  CL 105  CO2 28  GLUCOSE 90  BUN 18  CREATININE 0.95  CALCIUM 10.7*     Intake/Output Summary (Last 24 hours) at 10/09/2023 0926 Last data filed at 10/09/2023 0820 Gross per 24 hour  Intake 660 ml  Output 1400 ml  Net -740 ml        Physical Exam: Vital Signs Blood pressure 135/78, pulse (!) 51, temperature (!) 97.4 F (36.3 C), temperature source Oral, resp. rate 18, height 5\' 8"  (1.727 m), weight 69.2 kg, SpO2 97%.   General: awake, alert, appropriate,.  No acute distress. HENT: conjugate gaze; oropharynx moist- wearing hard collar CV: regular rate and rhythm; no JVD Pulmonary: CTA B/L; no W/R/R- good air movement GI: soft, NT, ND, (+)BS Psychiatric: appropriate Skin: Steri-Strips on anterior neck intact Neurological: alert, awake, oriented x 3.  No apparent deficits. Cranial nerves II through XII intact RUE- 5-/5 throughout LUE- Biceps 3+/5; Triceps 4-/5; WE 4-/5; Grip  4/5 and FA 4/5 LE's 5-/5 throughout  Prior exams: Neurologic exam:  Cognition: AAO x3 Language: Fluent, Mild dysarthria.   Insight: Fair insight into current condition.    3 beats clonus in LE's- no increased tone seen MSK: Very TTP with hyperalgesia over L deltoid and shoulder laterally- NO change today. 1/2" sublux of shoulder--tender with PROM RUE- Biceps 4-/5; WE 4/5; Triceps 4-/5; Grip 3+ to 4-/5, FA 3+/5 LUE_ Biceps 2+/5; WE 3+/5; Triceps 2+/5; Grip 3/5; and FA 3/5 less  TTP over R anterior shoulder with now no pain with resisted flexion- t  Skin: C/D/I. No apparent lesions.IV intact    Sensation: To light touch hypersensitive in LUE at shoulder  Reflexes :Negative Hoffman's and babinski signs bilaterally.  CN: 2-12 grossly intact.  Coordination: No apparent tremors. Weakness limits FNF on left ; + Mild LLE HTS Spasticity: MAS 0 in all extremities. Flaccid LUE. C/W today's exam 10/09/2023.       Assessment/Plan: 1. Functional deficits which require 3+ hours per day of interdisciplinary therapy in a comprehensive inpatient rehab setting. Physiatrist is providing close team supervision and 24 hour management of active medical problems listed below. Physiatrist and rehab team continue to assess barriers to  discharge/monitor patient progress toward functional and medical goals  Care Tool:  Bathing    Body parts bathed by patient: Left arm, Chest, Abdomen, Front perineal area, Buttocks, Right upper leg, Left upper leg, Face, Right arm   Body parts bathed by helper: Right arm Body parts n/a: Right lower leg, Left lower leg   Bathing assist Assist Level: Contact Guard/Touching assist     Upper Body Dressing/Undressing Upper body dressing   What is the patient wearing?: Pull over shirt    Upper body assist Assist Level: Minimal Assistance - Patient > 75%    Lower Body Dressing/Undressing Lower body dressing      What is the patient wearing?: Pants, Underwear/pull  up     Lower body assist Assist for lower body dressing: Minimal Assistance - Patient > 75%     Toileting Toileting    Toileting assist Assist for toileting: Minimal Assistance - Patient > 75%     Transfers Chair/bed transfer  Transfers assist     Chair/bed transfer assist level: Contact Guard/Touching assist     Locomotion Ambulation   Ambulation assist   Ambulation activity did not occur: Safety/medical concerns (2/2 endurance deficit/ limited activity tolerance)  Assist level: Contact Guard/Touching assist Assistive device: Rollator Max distance: 1000   Walk 10 feet activity   Assist  Walk 10 feet activity did not occur: Safety/medical concerns (2/2 endurance deficit/ limited activity tolerance)  Assist level: Supervision/Verbal cueing Assistive device: Rollator   Walk 50 feet activity   Assist Walk 50 feet with 2 turns activity did not occur: Safety/medical concerns (2/2 endurance deficit/ limited activity tolerance)  Assist level: Supervision/Verbal cueing Assistive device: Rollator    Walk 150 feet activity   Assist Walk 150 feet activity did not occur: Safety/medical concerns (2/2 endurance deficit/ limited activity tolerance)  Assist level: Contact Guard/Touching assist Assistive device: Rollator    Walk 10 feet on uneven surface  activity   Assist Walk 10 feet on uneven surfaces activity did not occur: Safety/medical concerns (d/t limited activity tolerance and imbalance)   Assist level: Contact Guard/Touching assist Assistive device: Rollator   Wheelchair     Assist Is the patient using a wheelchair?: No Type of Wheelchair: Manual Wheelchair activity did not occur: N/A  Wheelchair assist level: Dependent - Patient 0%      Wheelchair 50 feet with 2 turns activity    Assist    Wheelchair 50 feet with 2 turns activity did not occur: N/A       Wheelchair 150 feet activity     Assist  Wheelchair 150 feet activity  did not occur: N/A       Blood pressure 135/78, pulse (!) 51, temperature (!) 97.4 F (36.3 C), temperature source Oral, resp. rate 18, height 5\' 8"  (1.727 m), weight 69.2 kg, SpO2 97%.   Medical Problem List and Plan: 1. Functional deficits due to SCI (central cord syndrome)- C4 ASIA C/D (incomplete quadriplegia)  with C4 fracture s/p arthrodesis C3-4 anterior interbody technique discectomy for decompression of spinal cord and exiting nerve roots with foraminotomies as well as additional level C4-5 interbody technique for decompression placement of anterior instrumentation consisting of interbody plate and screws C3-4-5 09/09/2023.  Cervical collar at all times             -patient may  shower             -ELOS/Goals: 18 to 21 days, min assist PT/OT/SLP                            -  On admission Dr. Maisie Fus cleared patient for soft collar when in bed for comfort; ortho tech order placed for soft collar and nursing order for soft collar in bed, alternating to hard collar when OOB   - will allow dog to come in from home with papers  - not ready for grounds pass quite yet  Started estim D/c 3/31 Pt getting results from estim- will buy one for home -d/w Dr Hall Busing Neuro[facility]rology- who sees many of my SCI patients- he will fit pt in in next 2-4 weeks after d/c Want shigh hat, not BSC- no hospital bed Con't CIR PT, OT and SLP 2.  Antithrombotics: -DVT/anticoagulation/segmental LUL PE:  Pharmaceutical: Lovenox initiated 09/11/2023 and titrated.  Neurosurgery Dr. Maisie Fus stating can switch to DOAC 3/7, since last hemoglobin 3/5 will wait until a.m. labs and switch if stable 3/8- pt refused labs this AM- will wait til Monday when have labs 3/12- Switched to Eliquis 5 mg BID from Tx dose lovenox  3/20- will need for 3 months total- went over that increased risk of DVT/PE is for a total of 3 months for since has 2 Pe's 3/27- explained cannot take NSAIDs as well as Eliquis, but can use Voltaren gel- until  off Eliquis- esp in setting of SDH               -antiplatelet therapy: N/A   3. Pain Management: OxyContin sustained release 10 mg every 12 hours, Neurontin 300 mg 3 times daily Robaxin as needed.  Will add tramadol as needed   3/8- pt reports pain usually controlled- con't regimen  3/10--still having a lot of LUE--increase gabapentin to 400mg  tid  3/11- change Tylenol to 1000 mg q8 hours per pt request  3/12- has been helpful to add tylenol  3/17- has tramadol- not really using much- last used 3/14  3/18- 3/19 still has hyperalgesia- of LUE but we discussed using other nerve pain meds- but wait since they will cause urinary retention- doing desensitization techniques  -also refused MRI of L shoulder since "hates MRI machines"   3/20- will add Baclofen 5 mg TID prn for spasms and stop Robaxin  3/22-23 baclofen seems effective for spasms  3/28- having more hyperalgesia- if hasn't improved by tomorrow, suggest increase in gabapentin to 600 mg TID Saturday- max dose I'd use on him  3-29: Pain well-controlled on current regimen.  No adjustments made  4. Mood/Behavior/Sleep/Hallucinations: Melatonin 5 mg nightly.  Xanax Dced on admission per family d/t confusion.              - antipsychotic agents: Seroquel 12.5-25 mg PRN              - Delirium precautions with stimulation during the daytime, quiet with low lights an minimal interruptions at nighttime, limit lines/tubes/drains to essential access only, limit physical and chemical restraints               - Melatonin 5 mg at bedtime, trazodone 25-50 mg PRN.  - May benefit from standing at bedtime seroquel if trazodone ineffective.    3/8- slept OK- will con't regimen for now  3/10 continues to struggle with sleep, partially due to pain    -gabapentin increased above   -will schedule trazodone @2100  with back up dose prn       -pt voiced some feeling of depression over his accident/ condition -he is interested in speaking with  neuropsychology. -will ask Dr. Kieth Brightly to see patient regarding reactive depression. 3/140- added  Seroquel yesterday for psychomotor agitation- will increase to 25 mg at bedtime tonight  3/17- sleeping great- feels more confused/delayed this AM- don't see a medicine that could cause this 3/18- off Trazodone due to urinary issues- admits very sad- but doesn't want to add meds! 3/19- Pt refuses SSRI for mood 3-29: EKG for bradycardia, Seroquel.  QTc within normal limits.  Discussed trial off of Seroquel since agitation has resolved; patient wishes to continue it at this time.  5. Neuropsych/cognition/small SDH: This patient is not completely capable of making decisions on his own behalf.              - telesitter continued --renewed daily    3/20- got off Telesitter 6. Skin/Wound Care: Routine skin checks 7. Fluids/Electrolytes/Nutrition/hyponatremia: Routine in and outs with follow-up chemistries    Latest Ref Rng & Units 10/07/2023    6:09 AM 10/04/2023    6:11 AM 09/30/2023    6:11 AM  BMP  Glucose 70 - 99 mg/dL 90  90  97   BUN 8 - 23 mg/dL 18  16  17    Creatinine 0.61 - 1.24 mg/dL 1.61  0.96  0.45   Sodium 135 - 145 mmol/L 140  140  140   Potassium 3.5 - 5.1 mmol/L 4.0  4.3  4.1   Chloride 98 - 111 mmol/L 105  106  106   CO2 22 - 32 mmol/L 28  26  28    Calcium 8.9 - 10.3 mg/dL 40.9  81.1  91.4     8.  Dysphagia. Cortrak placed 3/5.   Currently on a mechanical soft diet with MBS pending Advance diet as tolerated per speech therapy              - On TF with 50 ml/hr osmolite 1.5 + prosource 60 ml daily              - Dietary consulted for management   3/8- will add IVFs, pt insistent- for dehydration- refused labs today so cannot see Cr/BUN to assess- but says not drinking enough  3/10--changed ivf to free water thru tube--encourage PO as well  **update 4:30pm--pt cleared for D2/thins per SLP!   -RD transitioning to nocturnal feeds beginning at 0500 tomorrow  3/11- fed self  breakfast this Am and dinner last night for first time  3/12- con't cortrak  3/13- Got OK by Dietician to remove Cortrak and is on D3 thin diet, so no need for steroids- and would probably do better if remove Cortrak- will remove today and monitor  3/14- doing ok without Cortrak  3/18- Is dry- BUN 28- will have pt push fluids  3/23 pushing po, labs again Monday  3/24 BUN/Cr stable at 16/0.89  3/27- BUN 18 and Cr 0.95  9.  Hypertension with Orthostatic hypotension.  Cozaar 50 mg daily.  Monitor with increased mobility.              - Holding home hydrochlorothiazide    3/8- BP 110s- a little soft- monitor for Orthostatic hypotension  3/11- will decrease Cozaar to 25 mg daily and add Midodrine 2.5 mg TID with meals- to start at 6:30am since almost passed out 2x per PT.   3/12- BP dropped to 58 systolic yesterday-added Midodrine- BP is slightly elevated in supine, but dropping dramatically in spite of TEDs and ACE wraps  Vitals:   10/08/23 1927 10/09/23 0546  BP: (!) 163/95 135/78  Pulse: 69 (!) 51  Resp: 18 18  Temp:  98.6 F (37 C) (!) 97.4 F (36.3 C)  SpO2: 97% 97%  Patient still on losartan will discontinue. 3/20- Pt and I agree a little Permissive elevated BP supine is better than dropping BP in therapy- will increase Midodrine ot compensate for Low BP form Flomax starting.   3/23 another significant spell yesterday while he had two prolonged ambulation experiences before and after without issue  -may be the timing of midodrine.   -also now on flomax which might be exacerbating  -continue with teds/binder etc, push fluids  -in the short term should avoid prolonged ambulation/upright activities when it is longer than 3 hours since last midodrine dose 3/24 Continues to have dizziness with orthostatic hypotension during therapy.  Consider discontinue Flomax 3/25- will d/c Flomax since still lightheaded with walking- esp since has Foley in for now 3/26- will d/c Urecholine- since still  dizzy- since has foley- is understanding more of pre syncopal sx's- so catching it earlier- sitting more frequently. 3/27-3/28 still having episodes of lightheadedness, but catching it and sitting down- takes longer to occur  3/29: BP stable, HR low; EKG normal sinus rhythm.  Patient states symptoms are overall improving.  Monitor.  10.  BPH with likely neurogenic bladder.  Proscar 5 mg nightly.  Check PVR             - Has coude catheter placed 2/27, patient hesitant to remove, OK to keep tonight and would re-address in AM - states he needs to be able to stand to urinate d/t BPH   3/8- explained can wait 2 days, but then have to remove foley-   3/10---remove foley--voiding trial  3/12- Requiring caths- no real voiding so far - will add Urecholine, because OH makes it difficult to add Flomax. Add 5 mg TID for now- monitor  3/13- will wait on Flomax- until BP doing better- bladder scans and caths up to 1000cc- needs to do every 4 hours based on cath results-d/w nursing 3/14- wrote order for coude' caths only and lidocaine jelly with caths  Midodrine is an alpha-blocker which also may be affecting bladder outflow, will start weaning today.  Scopolamine has anticholinergic effects which can also reduce bladder emptying and has been discontinued.  Certainly his urinary issues may be related to his spinal cord injury but will try to minimize pharmaceutical effects of his meds 3/17- they placed Foley this weekend due to trauma- will keep 3-4 days then d/c.   3/18- will d/c foley Thursday AM since placed Sunday night-for trauma- pt said "happy for break from cathing"  3/19- will remove Foley in AM/Thursday since don't want pt getting UTI 3/20- will d/c Foley today- wil add Flomax and increase Midodrine to help combat the [facility] will get from Flomax.  3/22-23 foley replaced d/t retention/hematuria--primary team can reassess this coming week.   -urine remains clear/yellow -continue flomax (see discussion  #9) 3/24 continue Foley 3/25- pt doesn't want to remove Foley before leaves- since still bleeding from meatus- pt aware has risk of UTI with foley, but he's very concerned about trauma- which I agree- called Dr Hall Busing- Neuro-Urology about seeing pt- he will see in the next 2-3 weeks per Dr Katrinka Blazing 3/26- will d/c Urecholine-  3/28- pt emptying his own leg bag/foley and wife trained on foley care   11.  Constipation with likely neurogenic bowel.   No BM since admission               - Miralax 17 g BID,  sennakot S 2 tabs BID              - PRN sorbitol for moderate constipation, PRN fleet enema for severe constipation   3/8- will give 45 cc Sorbitol and if no results, Soap suds enema this evening  3/10- bm's continent yesterday--type 6--no changes to regimen today  3/11- Strained but not able to go this AM- will montior  3/12- LBM yesterday per pt- but 3/9 per chart- will check with therapy- if no results, will need more intervention- already on Miralax BID, Senna 2 tabs BID  3/28- LBM yesterday 12.  Acute respiratory failure with hypoxia/bilateral pneumonia.  Continue cefepime x 7 days.  Pulmonary services have signed off.              - Ongoing significant secretions with frequent suctioning              - Robitussin 5 mg Q6H             - Biotene PRN   3/8- running Cefepime now- con't 7 days total  3/9- CXR looks good- only mild L sided atelectasis - added Scopolamine due to increased secretions for a few days only  3/12- changed Duonebs back to QID from prn  3/13- improved secretions with Scopolamine - this may be contributing to retention no issues with secretions consider d/c  3/17- Scopolamine stopped  3/20- still using O2 at night- 2L- want sot have at home- knows insurance won't cover it- but wants for airhunger- still using ICS regularly.     13. L shoulder weakness and pain and new R shoulder pain.  No fracture on xray 2/26.              - Likely due to L C4 myelopathy; however  no accompanying sensory deficit and no other weakness. I suspect a radicular component also  - Monitor with therapies; would consider MRI shoulder for rotator cuff pathology if no improvement   3/13- Pt's main pain is anterior and upper trap on L side- will con't to monitor- doesn't want steroid injections at this time- R shoulder pain started overnight from pulling in bed- appears to be biceps tendinitis- wants to wait on steroids due to concern with hallucinations  3/14- ended up doing steroid injection with 20mg  of kenalog- pain doing better  3/19- pt refused L shoulder MRI  3/21- pt was discussing a magnesium spray- which is fine to try for L shoulder,  Trap pain with substitution for delt weakness, add sportscream  -3/22-23 have discussed left shoulder subluxation--encouraged him to keep arm elevated on pillows to prevent worsening  3/28- getting estim of L deltoid and biceps- doing better  14. Restlessness/psychomotor agitation  3/13- will try Seroquel 12.5 mg at bedtime- d/w TBI MD and she agreed it's better than Depakote.   3/14- increasing seroquel to 25 mg at bedtime  - Sleeping ok, now on seroquel , d/c trazodone, anticholinergic effect    - 3/29: Reported resolved per family and patient  15. 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   LOS: 22 days A FACE TO FACE EVALUATION WAS PERFORMED  Angelina Sheriff 10/09/2023, 9:26 AM

## 2023-10-09 NOTE — Plan of Care (Signed)
  Problem: SCI BOWEL ELIMINATION Goal: RH STG MANAGE BOWEL WITH ASSISTANCE Description: STG Manage Bowel with mod I Assistance. Outcome: Progressing   Problem: SCI BLADDER ELIMINATION Goal: RH STG MANAGE BLADDER WITH ASSISTANCE Description: STG Manage Bladder With mod I Assistance Outcome: Progressing   Problem: SCI BLADDER ELIMINATION Goal: RH STG MANAGE BLADDER WITH MEDICATION WITH ASSISTANCE Description: STG Manage Bladder With Medication With mod I Assistance. Outcome: Progressing

## 2023-10-10 MED ORDER — APIXABAN (ELIQUIS) EDUCATION KIT FOR DVT/PE PATIENTS
PACK | Freq: Once | Status: AC
  Filled 2023-10-10 (×3): qty 1

## 2023-10-10 NOTE — Plan of Care (Signed)
  Problem: RH Balance Goal: LTG: Patient will maintain dynamic sitting balance (OT) Description: LTG:  Patient will maintain dynamic sitting balance with assistance during activities of daily living (OT) Outcome: Completed/Met   Problem: Sit to Stand Goal: LTG:  Patient will perform sit to stand in prep for activites of daily living with assistance level (OT) Description: LTG:  Patient will perform sit to stand in prep for activites of daily living with assistance level (OT) Outcome: Completed/Met   Problem: RH Eating Goal: LTG Patient will perform eating w/assist, cues/equip (OT) Description: LTG: Patient will perform eating with assist, with/without cues using equipment (OT) Outcome: Completed/Met   Problem: RH Grooming Goal: LTG Patient will perform grooming w/assist,cues/equip (OT) Description: LTG: Patient will perform grooming with assist, with/without cues using equipment (OT) Outcome: Completed/Met   Problem: RH Bathing Goal: LTG Patient will bathe all body parts with assist levels (OT) Description: LTG: Patient will bathe all body parts with assist levels (OT) Outcome: Completed/Met   Problem: RH Dressing Goal: LTG Patient will perform upper body dressing (OT) Description: LTG Patient will perform upper body dressing with assist, with/without cues (OT). Outcome: Completed/Met Goal: LTG Patient will perform lower body dressing w/assist (OT) Description: LTG: Patient will perform lower body dressing with assist, with/without cues in positioning using equipment (OT) Outcome: Completed/Met   Problem: RH Toileting Goal: LTG Patient will perform toileting task (3/3 steps) with assistance level (OT) Description: LTG: Patient will perform toileting task (3/3 steps) with assistance level (OT)  Outcome: Completed/Met   Problem: RH Functional Use of Upper Extremity Goal: LTG Patient will use RT/LT upper extremity as a (OT) Description: LTG: Patient will use right/left upper  extremity as a stabilizer/gross assist/diminished/nondominant/dominant level with assist, with/without cues during functional activity (OT) Outcome: Completed/Met   Problem: RH Toilet Transfers Goal: LTG Patient will perform toilet transfers w/assist (OT) Description: LTG: Patient will perform toilet transfers with assist, with/without cues using equipment (OT) Outcome: Completed/Met   Problem: RH Tub/Shower Transfers Goal: LTG Patient will perform tub/shower transfers w/assist (OT) Description: LTG: Patient will perform tub/shower transfers with assist, with/without cues using equipment (OT) Outcome: Completed/Met

## 2023-10-10 NOTE — Plan of Care (Signed)
  Problem: Consults Goal: RH SPINAL CORD INJURY PATIENT EDUCATION Description:  See Patient Education module for education specifics.  Outcome: Progressing   Problem: SCI BOWEL ELIMINATION Goal: RH STG MANAGE BOWEL WITH ASSISTANCE Description: STG Manage Bowel with mod I Assistance. Outcome: Progressing Goal: RH STG SCI MANAGE BOWEL WITH MEDICATION WITH ASSISTANCE Description: STG SCI Manage bowel with medication with mod I assistance. Outcome: Progressing   Problem: SCI BLADDER ELIMINATION Goal: RH STG MANAGE BLADDER WITH ASSISTANCE Description: STG Manage Bladder With mod I Assistance Outcome: Progressing Goal: RH STG MANAGE BLADDER WITH MEDICATION WITH ASSISTANCE Description: STG Manage Bladder With Medication With mod I Assistance. Outcome: Progressing   Problem: RH SAFETY Goal: RH STG ADHERE TO SAFETY PRECAUTIONS W/ASSISTANCE/DEVICE Description: STG Adhere to Safety Precautions With cues Assistance/Device. Outcome: Progressing   Problem: RH PAIN MANAGEMENT Goal: RH STG PAIN MANAGED AT OR BELOW PT'S PAIN GOAL Description: < 4 with prns Outcome: Progressing   Problem: RH KNOWLEDGE DEFICIT SCI Goal: RH STG INCREASE KNOWLEDGE OF SELF CARE AFTER SCI Description: Patient and spouse will be able to mange care using educational resources for medication, spinal cord injury, dietary modifications and skin care independently Outcome: Progressing   Problem: RH BOWEL ELIMINATION Goal: RH STG MANAGE BOWEL WITH ASSISTANCE Description: STG Manage Bowel with Assistance. Outcome: Progressing Goal: RH STG MANAGE BOWEL W/MEDICATION W/ASSISTANCE Description: STG Manage Bowel with Medication with Assistance. Outcome: Progressing   Problem: RH BLADDER ELIMINATION Goal: RH STG MANAGE BLADDER WITH ASSISTANCE Description: STG Manage Bladder With Assistance Outcome: Progressing   Problem: RH SKIN INTEGRITY Goal: RH STG SKIN FREE OF INFECTION/BREAKDOWN Outcome: Progressing

## 2023-10-10 NOTE — Progress Notes (Addendum)
 Steri-strips removed by nurse covering this nurse's break. Ted hose, abdominal binder in place, [place] J collar on when out of bed. Soft collar on when in bed. CHG/Foley care given. Bedside table, reacher within reach. Wife at bedside.    Tilden Dome, LPN

## 2023-10-10 NOTE — Plan of Care (Signed)
  Problem: Consults Goal: RH SPINAL CORD INJURY PATIENT EDUCATION Description:  See Patient Education module for education specifics.  Outcome: Progressing   Problem: SCI BOWEL ELIMINATION Goal: RH STG MANAGE BOWEL WITH ASSISTANCE Description: STG Manage Bowel with mod I Assistance. Outcome: Progressing Goal: RH STG SCI MANAGE BOWEL WITH MEDICATION WITH ASSISTANCE Description: STG SCI Manage bowel with medication with mod I assistance. Outcome: Progressing   Problem: SCI BLADDER ELIMINATION Goal: RH STG MANAGE BLADDER WITH ASSISTANCE Description: STG Manage Bladder With mod I Assistance Outcome: Progressing Goal: RH STG MANAGE BLADDER WITH MEDICATION WITH ASSISTANCE Description: STG Manage Bladder With Medication With mod I Assistance. Outcome: Progressing   Problem: RH PAIN MANAGEMENT Goal: RH STG PAIN MANAGED AT OR BELOW PT'S PAIN GOAL Description: < 4 with prns Outcome: Progressing   Problem: RH KNOWLEDGE DEFICIT SCI Goal: RH STG INCREASE KNOWLEDGE OF SELF CARE AFTER SCI Description: Patient and spouse will be able to mange care using educational resources for medication, spinal cord injury, dietary modifications and skin care independently Outcome: Progressing

## 2023-10-10 NOTE — Progress Notes (Signed)
 Occupational Therapy Session Note  Patient Details  Name: Brandon Watts MRN: [id number] Date of Birth: Apr 05, 1946  Today's Date: 10/10/2023 OT Individual Time: 1350-1430 OT Individual Time Calculation (min): 40 min    Short Term Goals: Week 3:  OT Short Term Goal 1 (Week 3): STG=LTG 2/2 ELOS  Skilled Therapeutic Interventions/Progress Updates:  Pt received sitting EOB, complaints of pain in L-shoulder with activity (present during entire CIR stay). Pre-medicated. Pt performs walk-in shower transfer with supervision + no AD. Pt tolerates bathing with no s/s of OH, wearing only thigh-high TEDs. Pt requires A to thoroughly bathe hair due to painful ROM, standing pericare with close supervision + grab bar. Pt manages LB garments with CGA + no UE, Max A provided for donning B thigh-high TEDs/ACE wraps. Pt requires Min A for management of UB clothing, dependent for [facility] J pad change in supine. Pt empties foley bag with CGA-close supervision + no AD for standing balance. Pt remained in care of spouse for completing of ADL tasks due to time constraints.   Therapy Documentation Precautions:  Precautions Precautions: Fall, Cervical Precaution/Restrictions Comments: Foley Required Braces or Orthoses: Cervical Brace Cervical Brace: Hard collar, Soft collar Restrictions Weight Bearing Restrictions Per Provider Order: No   Therapy/Group: Individual Therapy  Lou Cal, OTR/L, MSOT  10/10/2023, 6:00 AM

## 2023-10-10 NOTE — Progress Notes (Signed)
 Physical Therapy Discharge Summary  Patient Details  Name: Brandon Watts MRN: [id number] Date of Birth: Oct 13, 1945  Date of Discharge from PT service:October 10, 2023  Today's Date: 10/10/2023 PT Individual Time: 1124-1215   PT Individual Time Calculation: 51 minutes   Patient has met 6 of 7 long term goals due to improved activity tolerance, improved balance, improved postural control, increased strength, decreased pain, and improved coordination.  Patient to discharge at an ambulatory level Supervision/CGA.   Patient's care partner is independent to provide the necessary physical assistance at discharge.  Reasons goals not met: Pt continues to require supervision for ambulation for safety precautions and ongoing orthostatic hypotension  Recommendation:  Patient will benefit from ongoing skilled PT services in outpatient setting to continue to advance safe functional mobility, address ongoing impairments in dynamic standing balance, gait mechanics, BLE coordination, pain management, and minimize fall risk.  Equipment: rollator  Reasons for discharge: discharge from hospital  Patient/family agrees with progress made and goals achieved: Yes  PT Discharge Skilled Treatment Interventions: Pt presents in room seated in arm chair with pt wife and friends present, pt reports ongoing L shoulder pain but does not request intervention. Pt agreeable to PT. Session focused on gait training, therapeutic exercise for BLE strengthening and dynamic standing balance to complete as HEP, and therapeutic activities for educating pt and pt wife on safety precautions at DC as well as follow up therapy services.  Pt ambulates with rollator 200' with supervision approaching modI however pt does occasionally require cues and supervision secondary to orthostatic hypotension. Pt ambulates without device with supervision ~150' demonstrating good arm swing and stride length, increased postural sway. Pt ambulates  up/down 12 steps with RHR with supervision, pt able to self correct speed due to pt reporting feeling increased difficulty with eccentric control. Pt completes car transfer with supervision without device.  Pt completes one set of the following exercises with education on safety while completing at home, requires supervision for all exercises, pt wife educated on how to provide supervision. Pt completes step ups without UE support, instructed to wait until seeing OPPT prior to trying at home as pt demonstrating some difficulty with task.  Access Code: [email or web address] URL: [email or web address] Date: 10/10/2023 Prepared by: Edwin Cap  Exercises - Standing March with Counter Support  - 1 x daily - 7 x weekly - 1-2 sets - 10 reps - Mini Squat with Counter Support  - 1 x daily - 7 x weekly - 1-2 sets - 10 reps - Heel Raises with Counter Support  - 1 x daily - 7 x weekly - 1-2 sets - 10 reps - Heel Toe Raises with Counter Support  - 1 x daily - 7 x weekly - 1-2 sets - 10 reps - Forward Step Up with Counter Support  - 1 x daily - 7 x weekly - 1-2 sets - 10 reps  Pt and pt wife with questions on follow up at DC, provided with education on OPPT scheduling and frequency, MD present to assist with answering DC questions.  Pt returns to room and remains seated on EOB with pt wife at bedside with all needs within reach, cal light in place at end of session.   Precautions/Restrictions Precautions Precautions: Fall;Cervical Precaution/Restrictions Comments: Foley Required Braces or Orthoses: Cervical Brace Cervical Brace: Hard collar;Soft collar Restrictions Weight Bearing Restrictions Per Provider Order: No Pain Interference Pain Interference Pain Effect on Sleep: 1. Rarely or not at all Pain Interference with Therapy Activities: 3. Frequently  Pain Interference with Day-to-Day Activities: 2. Occasionally Cognition Overall Cognitive Status: Within Functional Limits for tasks  assessed Arousal/Alertness: Awake/alert Attention: Selective Sustained Attention: Appears intact Selective Attention: Appears intact Memory: Appears intact Awareness: Appears intact Problem Solving: Appears intact Safety/Judgment: Appears intact Sensation Sensation Light Touch: Impaired Detail Peripheral sensation comments: BUE tingling and numbness LUE>RUE Light Touch Impaired Details: Impaired RUE;Impaired LUE Hot/Cold: Appears Intact Proprioception: Appears Intact Stereognosis: Impaired by gross assessment Coordination Gross Motor Movements are Fluid and Coordinated: Yes Fine Motor Movements are Fluid and Coordinated: No Finger Nose Finger Test: Decreased FMC/dexterity in L-hand. Motor  Motor Motor: Abnormal postural alignment and control Motor - Discharge Observations: deficits due to incomplete quadriplegia, improved from initial eval  Mobility Bed Mobility Bed Mobility: Supine to Sit;Sit to Supine Rolling Right: Independent with assistive device Supine to Sit: Independent with assistive device Sit to Supine: Independent with assistive device Transfers Transfers: Sit to Stand;Stand to Sit Sit to Stand: Independent with assistive device Stand to Sit: Independent with assistive device Transfer (Assistive device): Rollator Locomotion  Gait Ambulation: Yes Gait Assistance: Supervision/Verbal cueing Gait Distance (Feet): 300 Feet Assistive device: Rollator Gait Assistance Details: Verbal cues for precautions/safety Gait Gait: Yes Gait Pattern: Impaired Gait Pattern: Step-through pattern;Decreased stride length Gait velocity: decreased Stairs / Additional Locomotion Stairs: Yes Stairs Assistance: Supervision/Verbal cueing Stair Management Technique: One rail Right Number of Stairs: 12 Height of Stairs: 6 Ramp: Supervision/Verbal cueing Wheelchair Mobility Wheelchair Mobility: No  Trunk/Postural Assessment  Cervical Assessment Cervical Assessment: Exceptions  to [facility] (cervical precautions) Thoracic Assessment Thoracic Assessment: Exceptions to [facility] (cervical precautions) Lumbar Assessment Lumbar Assessment: Exceptions to [facility] (posterior pelvic tilt) Postural Control Postural Control: Deficits on evaluation Righting Reactions: delated Protective Responses: decreased  Balance Balance Balance Assessed: Yes Static Sitting Balance Static Sitting - Balance Support: Feet supported Static Sitting - Level of Assistance: 6: Modified independent (Device/Increase time) Dynamic Sitting Balance Dynamic Sitting - Balance Support: During functional activity Dynamic Sitting - Level of Assistance: 6: Modified independent (Device/Increase time) Static Standing Balance Static Standing - Balance Support: During functional activity;Bilateral upper extremity supported Static Standing - Level of Assistance: 6: Modified independent (Device/Increase time) Dynamic Standing Balance Dynamic Standing - Balance Support: Bilateral upper extremity supported;During functional activity Dynamic Standing - Level of Assistance: 6: Modified independent (Device/Increase time) Extremity Assessment  RLE Assessment RLE Assessment: Within Functional Limits General Strength Comments: grossly 5/5 LLE Assessment LLE Assessment: Within Functional Limits General Strength Comments: grossly 5/5   Edwin Cap PT, DPT 10/10/2023, 5:01 PM

## 2023-10-10 NOTE — Plan of Care (Signed)
  Problem: RH Ambulation Goal: LTG Patient will ambulate in home environment (PT) Description: LTG: Patient will ambulate in home environment, # of feet with assistance (PT). Outcome: Not Met (add Reason) Note: Pt requires continued supervision for safety awareness and balance deficits at this time   Problem: RH Balance Goal: LTG Patient will maintain dynamic standing balance (PT) Description: LTG:  Patient will maintain dynamic standing balance with assistance during mobility activities (PT) Outcome: Completed/Met   Problem: RH Bed Mobility Goal: LTG Patient will perform bed mobility with assist (PT) Description: LTG: Patient will perform bed mobility with assistance, with/without cues (PT). Outcome: Completed/Met   Problem: RH Bed to Chair Transfers Goal: LTG Patient will perform bed/chair transfers w/assist (PT) Description: LTG: Patient will perform bed to chair transfers with assistance (PT). Outcome: Completed/Met   Problem: RH Car Transfers Goal: LTG Patient will perform car transfers with assist (PT) Description: LTG: Patient will perform car transfers with assistance (PT). Outcome: Completed/Met   Problem: RH Stairs Goal: LTG Patient will ambulate up and down stairs w/assist (PT) Description: LTG: Patient will ambulate up and down # of stairs with assistance (PT) Outcome: Completed/Met   Problem: RH Ambulation Goal: LTG Patient will ambulate in community environment (PT) Description: LTG: Patient will ambulate in community environment, # of feet with assistance (PT). Outcome: Completed/Met

## 2023-10-10 NOTE — Progress Notes (Signed)
 PROGRESS NOTE   Subjective/Complaints:  Vitally stable.  No events overnight. Patient complaining of remaining Steri-Strips on his anterior neck catching his hair and causing discomfort; requesting that it be removed after his shower today.  Otherwise, looking forward to discharge home.  Multiple questions regarding locations and providers to follow-up with; emphasized that this will be reviewed at discharge.  ROS:  Pt denies SOB, abd pain, CP, N/V/C/D, and vision changes + Orthostatic hypotension-ongoing,  Objective:   No results found.   No results for input(s): "WBC", "HGB", "HCT", "PLT" in the last 72 hours.   No results for input(s): "NA", "K", "CL", "CO2", "GLUCOSE", "BUN", "CREATININE", "CALCIUM" in the last 72 hours.    Intake/Output Summary (Last 24 hours) at 10/10/2023 1031 Last data filed at 10/10/2023 0541 Gross per 24 hour  Intake 236 ml  Output 1200 ml  Net -964 ml        Physical Exam: Vital Signs Blood pressure 120/72, pulse (!) 59, temperature 97.8 F (36.6 C), temperature source Oral, resp. rate 18, height 5\' 8"  (1.727 m), weight 69.2 kg, SpO2 95%.   General: awake, alert, appropriate,.  No acute distress.  Sitting up in gym. HENT: conjugate gaze; oropharynx moist- wearing hard collar CV: regular rate and rhythm; no JVD Pulmonary: CTA B/L; no W/R/R- good air movement GI: soft, NT, ND, (+)BS Psychiatric: appropriate Skin: Steri-Strips on anterior neck intact--some old dried blood over top Neurological: alert, awake, oriented x 3.  No apparent deficits. Cranial nerves II through XII intact Left upper extremity grossly 4 out of 5 throughout, otherwise right upper extremity and bilateral lower extremities 5 out of 5  Prior exams: Neurologic exam:  Cognition: AAO x3 Language: Fluent, Mild dysarthria.   Insight: Fair insight into current condition.    3 beats clonus in LE's- no increased tone  seen MSK: Very TTP with hyperalgesia over L deltoid and shoulder laterally- NO change today. 1/2" sublux of shoulder--tender with PROM RUE- Biceps 4-/5; WE 4/5; Triceps 4-/5; Grip 3+ to 4-/5, FA 3+/5 LUE_ Biceps 2+/5; WE 3+/5; Triceps 2+/5; Grip 3/5; and FA 3/5 less  TTP over R anterior shoulder with now no pain with resisted flexion- t  Skin: C/D/I. No apparent lesions.IV intact    Sensation: To light touch hypersensitive in LUE at shoulder  Reflexes :Negative Hoffman's and babinski signs bilaterally.  CN: 2-12 grossly intact.  Coordination: No apparent tremors. Weakness limits FNF on left ; + Mild LLE HTS Spasticity: MAS 0 in all extremities. Flaccid LUE. C/W today's exam 10/10/2023.       Assessment/Plan: 1. Functional deficits which require 3+ hours per day of interdisciplinary therapy in a comprehensive inpatient rehab setting. Physiatrist is providing close team supervision and 24 hour management of active medical problems listed below. Physiatrist and rehab team continue to assess barriers to discharge/monitor patient progress toward functional and medical goals  Care Tool:  Bathing    Body parts bathed by patient: Left arm, Chest, Abdomen, Front perineal area, Buttocks, Right upper leg, Left upper leg, Face, Right arm   Body parts bathed by helper: Right arm Body parts n/a: Right lower leg, Left lower leg   Bathing assist  Assist Level: Contact Guard/Touching assist     Upper Body Dressing/Undressing Upper body dressing   What is the patient wearing?: Pull over shirt    Upper body assist Assist Level: Minimal Assistance - Patient > 75%    Lower Body Dressing/Undressing Lower body dressing      What is the patient wearing?: Pants, Underwear/pull up     Lower body assist Assist for lower body dressing: Minimal Assistance - Patient > 75%     Toileting Toileting    Toileting assist Assist for toileting: Minimal Assistance - Patient > 75%      Transfers Chair/bed transfer  Transfers assist     Chair/bed transfer assist level: Contact Guard/Touching assist     Locomotion Ambulation   Ambulation assist   Ambulation activity did not occur: Safety/medical concerns (2/2 endurance deficit/ limited activity tolerance)  Assist level: Supervision/Verbal cueing Assistive device: No Device Max distance: 300   Walk 10 feet activity   Assist  Walk 10 feet activity did not occur: Safety/medical concerns (2/2 endurance deficit/ limited activity tolerance)  Assist level: Supervision/Verbal cueing Assistive device: No Device   Walk 50 feet activity   Assist Walk 50 feet with 2 turns activity did not occur: Safety/medical concerns (2/2 endurance deficit/ limited activity tolerance)  Assist level: Supervision/Verbal cueing Assistive device: No Device    Walk 150 feet activity   Assist Walk 150 feet activity did not occur: Safety/medical concerns (2/2 endurance deficit/ limited activity tolerance)  Assist level: Supervision/Verbal cueing Assistive device: No Device    Walk 10 feet on uneven surface  activity   Assist Walk 10 feet on uneven surfaces activity did not occur: Safety/medical concerns (d/t limited activity tolerance and imbalance)   Assist level: Contact Guard/Touching assist Assistive device: Other (comment) (no device.)   Wheelchair     Assist Is the patient using a wheelchair?: No Type of Wheelchair: Manual Wheelchair activity did not occur: N/A  Wheelchair assist level: Dependent - Patient 0%      Wheelchair 50 feet with 2 turns activity    Assist    Wheelchair 50 feet with 2 turns activity did not occur: N/A       Wheelchair 150 feet activity     Assist  Wheelchair 150 feet activity did not occur: N/A       Blood pressure 120/72, pulse (!) 59, temperature 97.8 F (36.6 C), temperature source Oral, resp. rate 18, height 5\' 8"  (1.727 m), weight 69.2 kg, SpO2  95%.   Medical Problem List and Plan: 1. Functional deficits due to SCI (central cord syndrome)- C4 ASIA C/D (incomplete quadriplegia)  with C4 fracture s/p arthrodesis C3-4 anterior interbody technique discectomy for decompression of spinal cord and exiting nerve roots with foraminotomies as well as additional level C4-5 interbody technique for decompression placement of anterior instrumentation consisting of interbody plate and screws C3-4-5 09/09/2023.  Cervical collar at all times             -patient may  shower             -ELOS/Goals: 18 to 21 days, min assist PT/OT/SLP                            -On admission Dr. Maisie Fus cleared patient for soft collar when in bed for comfort; ortho tech order placed for soft collar and nursing order for soft collar in bed, alternating to hard collar when OOB   -  will allow dog to come in from home with papers  - not ready for grounds pass quite yet  Started estim D/c 3/31 Pt getting results from estim- will buy one for home -d/w Dr Hall Busing Neuro[facility]- who sees many of my SCI patients- he will fit pt in in next 2-4 weeks after d/c Want shigh hat, not BSC- no hospital bed Con't CIR PT, OT and SLP   2.  Antithrombotics: -DVT/anticoagulation/segmental LUL PE:  Pharmaceutical: Lovenox initiated 09/11/2023 and titrated.  Neurosurgery Dr. Maisie Fus stating can switch to DOAC 3/7, since last hemoglobin 3/5 will wait until a.m. labs and switch if stable 3/8- pt refused labs this AM- will wait til Monday when have labs 3/12- Switched to Eliquis 5 mg BID from Tx dose lovenox  3/20- will need for 3 months total- went over that increased risk of DVT/PE is for a total of 3 months for since has 2 Pe's 3/27- explained cannot take NSAIDs as well as Eliquis, but can use Voltaren gel- until off Eliquis- esp in setting of SDH               -antiplatelet therapy: N/A   3. Pain Management: OxyContin sustained release 10 mg every 12 hours, Neurontin 300 mg 3 times  daily Robaxin as needed.  Will add tramadol as needed   3/8- pt reports pain usually controlled- con't regimen  3/10--still having a lot of LUE--increase gabapentin to 400mg  tid  3/11- change Tylenol to 1000 mg q8 hours per pt request  3/12- has been helpful to add tylenol  3/17- has tramadol- not really using much- last used 3/14  3/18- 3/19 still has hyperalgesia- of LUE but we discussed using other nerve pain meds- but wait since they will cause urinary retention- doing desensitization techniques  -also refused MRI of L shoulder since "hates MRI machines"   3/20- will add Baclofen 5 mg TID prn for spasms and stop Robaxin  3/22-23 baclofen seems effective for spasms  3/28- having more hyperalgesia- if hasn't improved by tomorrow, suggest increase in gabapentin to 600 mg TID Saturday- max dose I'd use on him  3-29: Pain well-controlled on current regimen.  No adjustments made  4. Mood/Behavior/Sleep/Hallucinations: Melatonin 5 mg nightly.  Xanax Dced on admission per family d/t confusion.              - antipsychotic agents: Seroquel 12.5-25 mg PRN              - Delirium precautions with stimulation during the daytime, quiet with low lights an minimal interruptions at nighttime, limit lines/tubes/drains to essential access only, limit physical and chemical restraints               - Melatonin 5 mg at bedtime, trazodone 25-50 mg PRN.  - May benefit from standing at bedtime seroquel if trazodone ineffective.    3/8- slept OK- will con't regimen for now  3/10 continues to struggle with sleep, partially due to pain    -gabapentin increased above   -will schedule trazodone @2100  with back up dose prn       -pt voiced some feeling of depression over his accident/ condition -he is interested in speaking with neuropsychology. -will ask Dr. Kieth Brightly to see patient regarding reactive depression. 3/140- added Seroquel yesterday for psychomotor agitation- will increase to 25 mg at bedtime tonight   3/17- sleeping great- feels more confused/delayed this AM- don't see a medicine that could cause this 3/18- off Trazodone due to urinary  issues- admits very sad- but doesn't want to add meds! 3/19- Pt refuses SSRI for mood 3-29: EKG for bradycardia, Seroquel.  QTc within normal limits.  Discussed trial off of Seroquel since agitation has resolved; patient wishes to continue it at this time.  5. Neuropsych/cognition/small SDH: This patient is not completely capable of making decisions on his own behalf.              - telesitter continued --renewed daily    3/20- got off Telesitter 6. Skin/Wound Care: Routine skin checks  3-30: Nursing orders to remove remaining Steri-Strips after shower today  7. Fluids/Electrolytes/Nutrition/hyponatremia: Routine in and outs with follow-up chemistries    Latest Ref Rng & Units 10/07/2023    6:09 AM 10/04/2023    6:11 AM 09/30/2023    6:11 AM  BMP  Glucose 70 - 99 mg/dL 90  90  97   BUN 8 - 23 mg/dL 18  16  17    Creatinine 0.61 - 1.24 mg/dL 1.61  0.96  0.45   Sodium 135 - 145 mmol/L 140  140  140   Potassium 3.5 - 5.1 mmol/L 4.0  4.3  4.1   Chloride 98 - 111 mmol/L 105  106  106   CO2 22 - 32 mmol/L 28  26  28    Calcium 8.9 - 10.3 mg/dL 40.9  81.1  91.4     8.  Dysphagia. Cortrak placed 3/5.   Currently on a mechanical soft diet with MBS pending Advance diet as tolerated per speech therapy              - On TF with 50 ml/hr osmolite 1.5 + prosource 60 ml daily              - Dietary consulted for management   3/8- will add IVFs, pt insistent- for dehydration- refused labs today so cannot see Cr/BUN to assess- but says not drinking enough  3/10--changed ivf to free water thru tube--encourage PO as well  **update 4:30pm--pt cleared for D2/thins per SLP!   -RD transitioning to nocturnal feeds beginning at 0500 tomorrow  3/11- fed self breakfast this Am and dinner last night for first time  3/12- con't cortrak  3/13- Got OK by Dietician to remove  Cortrak and is on D3 thin diet, so no need for steroids- and would probably do better if remove Cortrak- will remove today and monitor  3/14- doing ok without Cortrak  3/18- Is dry- BUN 28- will have pt push fluids  3/23 pushing po, labs again Monday  3/24 BUN/Cr stable at 16/0.89  3/27- BUN 18 and Cr 0.95  9.  Hypertension with Orthostatic hypotension.  Cozaar 50 mg daily.  Monitor with increased mobility.              - Holding home hydrochlorothiazide    3/8- BP 110s- a little soft- monitor for Orthostatic hypotension  3/11- will decrease Cozaar to 25 mg daily and add Midodrine 2.5 mg TID with meals- to start at 6:30am since almost passed out 2x per PT.   3/12- BP dropped to 58 systolic yesterday-added Midodrine- BP is slightly elevated in supine, but dropping dramatically in spite of TEDs and ACE wraps  Vitals:   10/09/23 1939 10/10/23 0532  BP: 132/72 120/72  Pulse: 62 (!) 59  Resp: 18 18  Temp: (!) 97.5 F (36.4 C) 97.8 F (36.6 C)  SpO2: 99% 95%  Patient still on losartan will discontinue. 3/20- Pt and I agree  a little Permissive elevated BP supine is better than dropping BP in therapy- will increase Midodrine ot compensate for Low BP form Flomax starting.   3/23 another significant spell yesterday while he had two prolonged ambulation experiences before and after without issue  -may be the timing of midodrine.   -also now on flomax which might be exacerbating  -continue with teds/binder etc, push fluids  -in the short term should avoid prolonged ambulation/upright activities when it is longer than 3 hours since last midodrine dose 3/24 Continues to have dizziness with orthostatic hypotension during therapy.  Consider discontinue Flomax 3/25- will d/c Flomax since still lightheaded with walking- esp since has Foley in for now 3/26- will d/c Urecholine- since still dizzy- since has foley- is understanding more of pre syncopal sx's- so catching it earlier- sitting more  frequently. 3/27-3/28 still having episodes of lightheadedness, but catching it and sitting down- takes longer to occur  3/29: BP stable, HR low; EKG normal sinus rhythm.  Patient states symptoms are overall improving.  Monitor. 3-30: Patient good with strategies to avoid severe symptoms, feels confident about going home.  10.  BPH with likely neurogenic bladder.  Proscar 5 mg nightly.  Check PVR             - Has coude catheter placed 2/27, patient hesitant to remove, OK to keep tonight and would re-address in AM - states he needs to be able to stand to urinate d/t BPH   3/8- explained can wait 2 days, but then have to remove foley-   3/10---remove foley--voiding trial  3/12- Requiring caths- no real voiding so far - will add Urecholine, because OH makes it difficult to add Flomax. Add 5 mg TID for now- monitor  3/13- will wait on Flomax- until BP doing better- bladder scans and caths up to 1000cc- needs to do every 4 hours based on cath results-d/w nursing 3/14- wrote order for coude' caths only and lidocaine jelly with caths  Midodrine is an alpha-blocker which also may be affecting bladder outflow, will start weaning today.  Scopolamine has anticholinergic effects which can also reduce bladder emptying and has been discontinued.  Certainly his urinary issues may be related to his spinal cord injury but will try to minimize pharmaceutical effects of his meds 3/17- they placed Foley this weekend due to trauma- will keep 3-4 days then d/c.   3/18- will d/c foley Thursday AM since placed Sunday night-for trauma- pt said "happy for break from cathing"  3/19- will remove Foley in AM/Thursday since don't want pt getting UTI 3/20- will d/c Foley today- wil add Flomax and increase Midodrine to help combat the [facility] will get from Flomax.  3/22-23 foley replaced d/t retention/hematuria--primary team can reassess this coming week.   -urine remains clear/yellow -continue flomax (see discussion #9) 3/24  continue Foley 3/25- pt doesn't want to remove Foley before leaves- since still bleeding from meatus- pt aware has risk of UTI with foley, but he's very concerned about trauma- which I agree- called Dr Hall Busing- Neuro-Urology about seeing pt- he will see in the next 2-3 weeks per Dr Katrinka Blazing 3/26- will d/c Urecholine-  3/28- pt emptying his own leg bag/foley and wife trained on foley care   11.  Constipation with likely neurogenic bowel.   No BM since admission               - Miralax 17 g BID, sennakot S 2 tabs BID              -  PRN sorbitol for moderate constipation, PRN fleet enema for severe constipation   3/8- will give 45 cc Sorbitol and if no results, Soap suds enema this evening  3/10- bm's continent yesterday--type 6--no changes to regimen today  3/11- Strained but not able to go this AM- will montior  3/12- LBM yesterday per pt- but 3/9 per chart- will check with therapy- if no results, will need more intervention- already on Miralax BID, Senna 2 tabs BID  3/28- LBM yesterday 12.  Acute respiratory failure with hypoxia/bilateral pneumonia.  Continue cefepime x 7 days.  Pulmonary services have signed off.              - Ongoing significant secretions with frequent suctioning              - Robitussin 5 mg Q6H             - Biotene PRN   3/8- running Cefepime now- con't 7 days total  3/9- CXR looks good- only mild L sided atelectasis - added Scopolamine due to increased secretions for a few days only  3/12- changed Duonebs back to QID from prn  3/13- improved secretions with Scopolamine - this may be contributing to retention no issues with secretions consider d/c  3/17- Scopolamine stopped  3/20- still using O2 at night- 2L- want sot have at home- knows insurance won't cover it- but wants for airhunger- still using ICS regularly.   3-30: No reports of oxygen use over this weekend    13. L shoulder weakness and pain and new R shoulder pain.  No fracture on xray 2/26.               - Likely due to L C4 myelopathy; however no accompanying sensory deficit and no other weakness. I suspect a radicular component also  - Monitor with therapies; would consider MRI shoulder for rotator cuff pathology if no improvement   3/13- Pt's main pain is anterior and upper trap on L side- will con't to monitor- doesn't want steroid injections at this time- R shoulder pain started overnight from pulling in bed- appears to be biceps tendinitis- wants to wait on steroids due to concern with hallucinations  3/14- ended up doing steroid injection with 20mg  of kenalog- pain doing better  3/19- pt refused L shoulder MRI  3/21- pt was discussing a magnesium spray- which is fine to try for L shoulder,  Trap pain with substitution for delt weakness, add sportscream  -3/22-23 have discussed left shoulder subluxation--encouraged him to keep arm elevated on pillows to prevent worsening  3/28- getting estim of L deltoid and biceps- doing better  14. Restlessness/psychomotor agitation  3/13- will try Seroquel 12.5 mg at bedtime- d/w TBI MD and she agreed it's better than Depakote.   3/14- increasing seroquel to 25 mg at bedtime  - Sleeping ok, now on seroquel , d/c trazodone, anticholinergic effect    - 3/29: Reported resolved per family and patient  15. 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   LOS: 23 days A FACE TO FACE EVALUATION WAS PERFORMED  Angelina Sheriff 10/10/2023, 10:31 AM

## 2023-10-11 ENCOUNTER — Other Ambulatory Visit (HOSPITAL_COMMUNITY): Payer: Self-pay

## 2023-10-11 ENCOUNTER — Telehealth (HOSPITAL_COMMUNITY): Payer: Self-pay | Admitting: Pharmacy Technician

## 2023-10-11 LAB — MAGNESIUM: Magnesium: 2.2 mg/dL (ref 1.7–2.4)

## 2023-10-11 MED ORDER — TRAMADOL HCL 50 MG PO TABS
50.0000 mg | ORAL_TABLET | Freq: Four times a day (QID) | ORAL | 0 refills | Status: DC | PRN
Start: 2023-10-11 — End: 2024-01-11
  Filled 2023-10-11: qty 30, 7d supply, fill #0

## 2023-10-11 MED ORDER — MIDODRINE HCL 10 MG PO TABS
10.0000 mg | ORAL_TABLET | Freq: Three times a day (TID) | ORAL | 0 refills | Status: DC
  Filled 2023-10-11: qty 90, 30d supply, fill #0

## 2023-10-11 MED ORDER — OXYCODONE HCL ER 10 MG PO T12A
10.0000 mg | EXTENDED_RELEASE_TABLET | Freq: Two times a day (BID) | ORAL | 0 refills | Status: DC
  Filled 2023-10-11: qty 14, 7d supply, fill #0

## 2023-10-11 MED ORDER — MORPHINE SULFATE ER 15 MG PO TBCR
15.0000 mg | EXTENDED_RELEASE_TABLET | Freq: Two times a day (BID) | ORAL | 0 refills | Status: DC
  Filled 2023-10-11: qty 14, 7d supply, fill #0

## 2023-10-11 MED ORDER — GABAPENTIN 400 MG PO CAPS
400.0000 mg | ORAL_CAPSULE | Freq: Three times a day (TID) | ORAL | 0 refills | Status: DC
  Filled 2023-10-11: qty 90, 30d supply, fill #0

## 2023-10-11 MED ORDER — POLYETHYLENE GLYCOL 3350 17 G PO PACK: 17.0000 g | PACK | Freq: Every day | ORAL | Status: AC | PRN

## 2023-10-11 MED ORDER — QUETIAPINE FUMARATE 25 MG PO TABS
25.0000 mg | ORAL_TABLET | Freq: Every day | ORAL | 0 refills | Status: DC
  Filled 2023-10-11: qty 30, 30d supply, fill #0

## 2023-10-11 MED ORDER — APIXABAN 5 MG PO TABS
5.0000 mg | ORAL_TABLET | Freq: Two times a day (BID) | ORAL | 0 refills | Status: DC
  Filled 2023-10-11: qty 60, 30d supply, fill #0

## 2023-10-11 MED ORDER — LIDOCAINE 4 % EX PTCH
2.0000 | MEDICATED_PATCH | CUTANEOUS | 0 refills | Status: DC
  Filled 2023-10-11: qty 18, 9d supply, fill #0
  Filled 2023-10-11: qty 30, 15d supply, fill #0

## 2023-10-11 MED ORDER — SENNOSIDES-DOCUSATE SODIUM 8.6-50 MG PO TABS: 2.0000 | ORAL_TABLET | Freq: Two times a day (BID) | ORAL | Status: AC

## 2023-10-11 MED ORDER — AZELASTINE HCL 0.1 % NA SOLN
1.0000 | Freq: Two times a day (BID) | NASAL | 0 refills | Status: DC
Start: 2023-10-11 — End: 2023-10-21
  Filled 2023-10-11: qty 30, 100d supply, fill #0

## 2023-10-11 MED ORDER — FINASTERIDE 5 MG PO TABS
5.0000 mg | ORAL_TABLET | Freq: Every day | ORAL | 0 refills | Status: DC
  Filled 2023-10-11: qty 30, 30d supply, fill #0

## 2023-10-11 MED ORDER — MELATONIN 5 MG PO TABS
5.0000 mg | ORAL_TABLET | Freq: Every day | ORAL | 0 refills | Status: AC
  Filled 2023-10-11: qty 30, 30d supply, fill #0

## 2023-10-11 MED ORDER — BACLOFEN 5 MG PO TABS
5.0000 mg | ORAL_TABLET | Freq: Three times a day (TID) | ORAL | 0 refills | Status: DC | PRN
  Filled 2023-10-11: qty 60, 20d supply, fill #0

## 2023-10-11 MED ORDER — ACETAMINOPHEN 325 MG PO TABS: 650.0000 mg | ORAL_TABLET | Freq: Four times a day (QID) | ORAL | Status: AC | PRN

## 2023-10-11 NOTE — Plan of Care (Signed)
  Problem: Consults Goal: RH SPINAL CORD INJURY PATIENT EDUCATION Description:  See Patient Education module for education specifics.  Outcome: Progressing   Problem: SCI BOWEL ELIMINATION Goal: RH STG MANAGE BOWEL WITH ASSISTANCE Description: STG Manage Bowel with mod I Assistance. Outcome: Progressing Goal: RH STG SCI MANAGE BOWEL WITH MEDICATION WITH ASSISTANCE Description: STG SCI Manage bowel with medication with mod I assistance. Outcome: Progressing   Problem: SCI BLADDER ELIMINATION Goal: RH STG MANAGE BLADDER WITH ASSISTANCE Description: STG Manage Bladder With mod I Assistance Outcome: Progressing Goal: RH STG MANAGE BLADDER WITH MEDICATION WITH ASSISTANCE Description: STG Manage Bladder With Medication With mod I Assistance. Outcome: Progressing   Problem: RH SAFETY Goal: RH STG ADHERE TO SAFETY PRECAUTIONS W/ASSISTANCE/DEVICE Description: STG Adhere to Safety Precautions With cues Assistance/Device. Outcome: Progressing   Problem: RH KNOWLEDGE DEFICIT SCI Goal: RH STG INCREASE KNOWLEDGE OF SELF CARE AFTER SCI Description: Patient and spouse will be able to mange care using educational resources for medication, spinal cord injury, dietary modifications and skin care independently Outcome: Progressing   Problem: RH BOWEL ELIMINATION Goal: RH STG MANAGE BOWEL WITH ASSISTANCE Description: STG Manage Bowel with Assistance. Outcome: Progressing Goal: RH STG MANAGE BOWEL W/MEDICATION W/ASSISTANCE Description: STG Manage Bowel with Medication with Assistance. Outcome: Progressing

## 2023-10-11 NOTE — Progress Notes (Signed)
 PA provided discharge instructions. TOC meds received. Patient discharge safely via private car with wife.   Tilden Dome, LPN

## 2023-10-11 NOTE — Telephone Encounter (Signed)
 Pharmacy Patient Advocate Encounter  Received notification from [facility] that Prior Authorization for Lidocaine 5% patches  has been APPROVED from 10/11/2023 to 07/12/2024   PA #/Case ID/Reference #: [id number]

## 2023-10-11 NOTE — Progress Notes (Signed)
 PROGRESS NOTE   Subjective/Complaints:  Pt reports questions about d/c- follow up-  Said urine amber- almost red- also bleeding around meatus still.     ROS:    Pt denies SOB, abd pain, CP, N/V/C/D, and vision changes  + Orthostatic hypotension-ongoing,  Objective:   No results found.   No results for input(s): "WBC", "HGB", "HCT", "PLT" in the last 72 hours.   No results for input(s): "NA", "K", "CL", "CO2", "GLUCOSE", "BUN", "CREATININE", "CALCIUM" in the last 72 hours.    Intake/Output Summary (Last 24 hours) at 10/11/2023 0840 Last data filed at 10/11/2023 1610 Gross per 24 hour  Intake 480 ml  Output 1500 ml  Net -1020 ml        Physical Exam: Vital Signs Blood pressure 116/73, pulse (!) 58, temperature 98.6 F (37 C), temperature source Oral, resp. rate 18, height 5\' 8"  (1.727 m), weight 69.2 kg, SpO2 95%.    General: awake, alert, appropriate, supine in bed; wife at bedside;  NAD HENT: conjugate gaze; oropharynx dry-wearing soft collar- dry mouth CV: regular rate and rhythm; no JVD Pulmonary: CTA B/L; no W/R/R- good air movement GI: soft, NT, ND, (+)BS Psychiatric: appropriate- interactive Neurological: Ox3- better memory this AM  Cranial nerves II through XII intact Left upper extremity grossly 4 out of 5 throughout, otherwise right upper extremity and bilateral lower extremities 5 out of 5  Prior exams: Neurologic exam:  Cognition: AAO x3 Language: Fluent, Mild dysarthria.   Insight: Fair insight into current condition.    3 beats clonus in LE's- no increased tone seen MSK: Very TTP with hyperalgesia over L deltoid and shoulder laterally- NO change today. 1/2" sublux of shoulder--tender with PROM RUE- Biceps 4-/5; WE 4/5; Triceps 4-/5; Grip 3+ to 4-/5, FA 3+/5 LUE_ Biceps 2+/5; WE 3+/5; Triceps 2+/5; Grip 3/5; and FA 3/5 less  TTP over R anterior shoulder with now no pain with resisted  flexion- t  Skin: C/D/I. No apparent lesions.IV intact    Sensation: To light touch hypersensitive in LUE at shoulder  Reflexes :Negative Hoffman's and babinski signs bilaterally.  CN: 2-12 grossly intact.  Coordination: No apparent tremors. Weakness limits FNF on left ; + Mild LLE HTS Spasticity: MAS 0 in all extremities. Flaccid LUE. C/W today's exam 10/11/2023.       Assessment/Plan: 1. Functional deficits which require 3+ hours per day of interdisciplinary therapy in a comprehensive inpatient rehab setting. Physiatrist is providing close team supervision and 24 hour management of active medical problems listed below. Physiatrist and rehab team continue to assess barriers to discharge/monitor patient progress toward functional and medical goals  Care Tool:  Bathing    Body parts bathed by patient: Left arm, Chest, Abdomen, Front perineal area, Buttocks, Right upper leg, Left upper leg, Face, Right arm, Right lower leg, Left lower leg (Wears TEDs for BP management)   Body parts bathed by helper: Right arm Body parts n/a: Right lower leg, Left lower leg   Bathing assist Assist Level: Supervision/Verbal cueing     Upper Body Dressing/Undressing Upper body dressing   What is the patient wearing?: Hospital gown only    Upper body assist Assist Level:  Minimal Assistance - Patient > 75%    Lower Body Dressing/Undressing Lower body dressing      What is the patient wearing?: Pants, Underwear/pull up, Ace wrap/stump shrinker     Lower body assist Assist for lower body dressing: Minimal Assistance - Patient > 75%     Toileting Toileting    Toileting assist Assist for toileting: Minimal Assistance - Patient > 75%     Transfers Chair/bed transfer  Transfers assist     Chair/bed transfer assist level: Independent with assistive device Chair/bed transfer assistive device: Geologist, engineering   Ambulation assist   Ambulation activity did not occur:  Safety/medical concerns (2/2 endurance deficit/ limited activity tolerance)  Assist level: Supervision/Verbal cueing Assistive device: Rollator Max distance: 300   Walk 10 feet activity   Assist  Walk 10 feet activity did not occur: Safety/medical concerns (2/2 endurance deficit/ limited activity tolerance)  Assist level: Supervision/Verbal cueing Assistive device: Rollator   Walk 50 feet activity   Assist Walk 50 feet with 2 turns activity did not occur: Safety/medical concerns (2/2 endurance deficit/ limited activity tolerance)  Assist level: Supervision/Verbal cueing Assistive device: Rollator    Walk 150 feet activity   Assist Walk 150 feet activity did not occur: Safety/medical concerns (2/2 endurance deficit/ limited activity tolerance)  Assist level: Supervision/Verbal cueing Assistive device: Rollator    Walk 10 feet on uneven surface  activity   Assist Walk 10 feet on uneven surfaces activity did not occur: Safety/medical concerns (d/t limited activity tolerance and imbalance)   Assist level: Contact Guard/Touching assist Assistive device: Other (comment) (no device.)   Wheelchair     Assist Is the patient using a wheelchair?: No (ambulates on unit) Type of Wheelchair: Manual Wheelchair activity did not occur: N/A  Wheelchair assist level: Dependent - Patient 0%      Wheelchair 50 feet with 2 turns activity    Assist    Wheelchair 50 feet with 2 turns activity did not occur: N/A       Wheelchair 150 feet activity     Assist  Wheelchair 150 feet activity did not occur: N/A       Blood pressure 116/73, pulse (!) 58, temperature 98.6 F (37 C), temperature source Oral, resp. rate 18, height 5\' 8"  (1.727 m), weight 69.2 kg, SpO2 95%.   Medical Problem List and Plan: 1. Functional deficits due to SCI (central cord syndrome)- C4 ASIA C/D (incomplete quadriplegia)  with C4 fracture s/p arthrodesis C3-4 anterior interbody technique  discectomy for decompression of spinal cord and exiting nerve roots with foraminotomies as well as additional level C4-5 interbody technique for decompression placement of anterior instrumentation consisting of interbody plate and screws C3-4-5 09/09/2023.  Cervical collar at all times             -patient may  shower             -ELOS/Goals: 18 to 21 days, min assist PT/OT/SLP                            -On admission Dr. Maisie Fus cleared patient for soft collar when in bed for comfort; ortho tech order placed for soft collar and nursing order for soft collar in bed, alternating to hard collar when OOB   - will allow dog to come in from home with papers  - not ready for grounds pass quite yet  Started estim D/c 3/31 Pt getting results  from estim- will buy one for home -d/w Dr Hall Busing Neuro[facility]rology- who sees many of my SCI patients- he will fit pt in in next 2-4 weeks after d/c D/c today Called Dr Michaelle Copas office to get appt- they will call him- also gave office number to pt's wife.  -Also gave number for outpt PT/OT and my contact info  2.  Antithrombotics: -DVT/anticoagulation/segmental LUL PE:  Pharmaceutical: Lovenox initiated 09/11/2023 and titrated.  Neurosurgery Dr. Maisie Fus stating can switch to DOAC 3/7, since last hemoglobin 3/5 will wait until a.m. labs and switch if stable 3/8- pt refused labs this AM- will wait til Monday when have labs 3/12- Switched to Eliquis 5 mg BID from Tx dose lovenox  3/20- will need for 3 months total- went over that increased risk of DVT/PE is for a total of 3 months for since has 2 Pe's 3/27- explained cannot take NSAIDs as well as Eliquis, but can use Voltaren gel- until off Eliquis- esp in setting of SDH               -antiplatelet therapy: N/A   3. Pain Management: OxyContin sustained release 10 mg every 12 hours, Neurontin 300 mg 3 times daily Robaxin as needed.  Will add tramadol as needed   3/8- pt reports pain usually controlled- con't  regimen  3/10--still having a lot of LUE--increase gabapentin to 400mg  tid  3/11- change Tylenol to 1000 mg q8 hours per pt request  3/12- has been helpful to add tylenol  3/17- has tramadol- not really using much- last used 3/14  3/18- 3/19 still has hyperalgesia- of LUE but we discussed using other nerve pain meds- but wait since they will cause urinary retention- doing desensitization techniques  -also refused MRI of L shoulder since "hates MRI machines"   3/20- will add Baclofen 5 mg TID prn for spasms and stop Robaxin  3/22-23 baclofen seems effective for spasms  3/28- having more hyperalgesia- if hasn't improved by tomorrow, suggest increase in gabapentin to 600 mg TID Saturday- max dose I'd use on him  3-29: Pain well-controlled on current regimen.  No adjustments made  4. Mood/Behavior/Sleep/Hallucinations: Melatonin 5 mg nightly.  Xanax Dced on admission per family d/t confusion.              - antipsychotic agents: Seroquel 12.5-25 mg PRN              - Delirium precautions with stimulation during the daytime, quiet with low lights an minimal interruptions at nighttime, limit lines/tubes/drains to essential access only, limit physical and chemical restraints               - Melatonin 5 mg at bedtime, trazodone 25-50 mg PRN.  - May benefit from standing at bedtime seroquel if trazodone ineffective.    3/8- slept OK- will con't regimen for now  3/10 continues to struggle with sleep, partially due to pain    -gabapentin increased above   -will schedule trazodone @2100  with back up dose prn       -pt voiced some feeling of depression over his accident/ condition -he is interested in speaking with neuropsychology. -will ask Dr. Kieth Brightly to see patient regarding reactive depression. 3/140- added Seroquel yesterday for psychomotor agitation- will increase to 25 mg at bedtime tonight  3/17- sleeping great- feels more confused/delayed this AM- don't see a medicine that could cause  this 3/18- off Trazodone due to urinary issues- admits very sad- but doesn't want to add meds! 3/19-  Pt refuses SSRI for mood 3-29: EKG for bradycardia, Seroquel.  QTc within normal limits.  Discussed trial off of Seroquel since agitation has resolved; patient wishes to continue it at this time.  5. Neuropsych/cognition/small SDH: This patient is not completely capable of making decisions on his own behalf.              - telesitter continued --renewed daily    3/20- got off Telesitter 6. Skin/Wound Care: Routine skin checks  3-30: Nursing orders to remove remaining Steri-Strips after shower today  7. Fluids/Electrolytes/Nutrition/hyponatremia: Routine in and outs with follow-up chemistries    Latest Ref Rng & Units 10/07/2023    6:09 AM 10/04/2023    6:11 AM 09/30/2023    6:11 AM  BMP  Glucose 70 - 99 mg/dL 90  90  97   BUN 8 - 23 mg/dL 18  16  17    Creatinine 0.61 - 1.24 mg/dL 4.09  8.11  9.14   Sodium 135 - 145 mmol/L 140  140  140   Potassium 3.5 - 5.1 mmol/L 4.0  4.3  4.1   Chloride 98 - 111 mmol/L 105  106  106   CO2 22 - 32 mmol/L 28  26  28    Calcium 8.9 - 10.3 mg/dL 78.2  95.6  21.3     8.  Dysphagia. Cortrak placed 3/5.   Currently on a mechanical soft diet with MBS pending Advance diet as tolerated per speech therapy              - On TF with 50 ml/hr osmolite 1.5 + prosource 60 ml daily              - Dietary consulted for management   3/8- will add IVFs, pt insistent- for dehydration- refused labs today so cannot see Cr/BUN to assess- but says not drinking enough  3/10--changed ivf to free water thru tube--encourage PO as well  **update 4:30pm--pt cleared for D2/thins per SLP!   -RD transitioning to nocturnal feeds beginning at 0500 tomorrow  3/11- fed self breakfast this Am and dinner last night for first time  3/12- con't cortrak  3/13- Got OK by Dietician to remove Cortrak and is on D3 thin diet, so no need for steroids- and would probably do better if remove  Cortrak- will remove today and monitor  3/14- doing ok without Cortrak  3/18- Is dry- BUN 28- will have pt push fluids  3/23 pushing po, labs again Monday  3/24 BUN/Cr stable at 16/0.89  3/27- BUN 18 and Cr 0.95  9.  Hypertension with Orthostatic hypotension.  Cozaar 50 mg daily.  Monitor with increased mobility.              - Holding home hydrochlorothiazide    3/8- BP 110s- a little soft- monitor for Orthostatic hypotension  3/11- will decrease Cozaar to 25 mg daily and add Midodrine 2.5 mg TID with meals- to start at 6:30am since almost passed out 2x per PT.   3/12- BP dropped to 58 systolic yesterday-added Midodrine- BP is slightly elevated in supine, but dropping dramatically in spite of TEDs and ACE wraps  Vitals:   10/10/23 2211 10/11/23 0600  BP:  116/73  Pulse:  (!) 58  Resp:  18  Temp: 98.1 F (36.7 C) 98.6 F (37 C)  SpO2:  95%  Patient still on losartan will discontinue. 3/20- Pt and I agree a little Permissive elevated BP supine is better than dropping BP in  therapy- will increase Midodrine ot compensate for Low BP form Flomax starting.   3/23 another significant spell yesterday while he had two prolonged ambulation experiences before and after without issue  -may be the timing of midodrine.   -also now on flomax which might be exacerbating  -continue with teds/binder etc, push fluids  -in the short term should avoid prolonged ambulation/upright activities when it is longer than 3 hours since last midodrine dose 3/24 Continues to have dizziness with orthostatic hypotension during therapy.  Consider discontinue Flomax 3/25- will d/c Flomax since still lightheaded with walking- esp since has Foley in for now 3/26- will d/c Urecholine- since still dizzy- since has foley- is understanding more of pre syncopal sx's- so catching it earlier- sitting more frequently. 3/27-3/28 still having episodes of lightheadedness, but catching it and sitting down- takes longer to occur   3/29: BP stable, HR low; EKG normal sinus rhythm.  Patient states symptoms are overall improving.  Monitor. 3-30: Patient good with strategies to avoid severe symptoms, feels confident about going home. 3/31- will only check BP if feels bad- otherwise, no reason to check BP- d/w pt and wife 10.  BPH with likely neurogenic bladder.  Proscar 5 mg nightly.  Check PVR             - Has coude catheter placed 2/27, patient hesitant to remove, OK to keep tonight and would re-address in AM - states he needs to be able to stand to urinate d/t BPH   3/8- explained can wait 2 days, but then have to remove foley-   3/10---remove foley--voiding trial  3/12- Requiring caths- no real voiding so far - will add Urecholine, because OH makes it difficult to add Flomax. Add 5 mg TID for now- monitor  3/13- will wait on Flomax- until BP doing better- bladder scans and caths up to 1000cc- needs to do every 4 hours based on cath results-d/w nursing 3/14- wrote order for coude' caths only and lidocaine jelly with caths  Midodrine is an alpha-blocker which also may be affecting bladder outflow, will start weaning today.  Scopolamine has anticholinergic effects which can also reduce bladder emptying and has been discontinued.  Certainly his urinary issues may be related to his spinal cord injury but will try to minimize pharmaceutical effects of his meds 3/17- they placed Foley this weekend due to trauma- will keep 3-4 days then d/c.   3/18- will d/c foley Thursday AM since placed Sunday night-for trauma- pt said "happy for break from cathing"  3/19- will remove Foley in AM/Thursday since don't want pt getting UTI 3/20- will d/c Foley today- wil add Flomax and increase Midodrine to help combat the [facility] will get from Flomax.  3/22-23 foley replaced d/t retention/hematuria--primary team can reassess this coming week.   -urine remains clear/yellow -continue flomax (see discussion #9) 3/24 continue Foley 3/25- pt doesn't  want to remove Foley before leaves- since still bleeding from meatus- pt aware has risk of UTI with foley, but he's very concerned about trauma- which I agree- called Dr Hall Busing- Neuro-Urology about seeing pt- he will see in the next 2-3 weeks per Dr Katrinka Blazing 3/26- will d/c Urecholine-  3/28- pt emptying his own leg bag/foley and wife trained on foley care   11.  Constipation with likely neurogenic bowel.   No BM since admission               - Miralax 17 g BID, sennakot S 2 tabs BID              -  PRN sorbitol for moderate constipation, PRN fleet enema for severe constipation   3/8- will give 45 cc Sorbitol and if no results, Soap suds enema this evening  3/10- bm's continent yesterday--type 6--no changes to regimen today  3/11- Strained but not able to go this AM- will montior  3/12- LBM yesterday per pt- but 3/9 per chart- will check with therapy- if no results, will need more intervention- already on Miralax BID, Senna 2 tabs BID  3/28- LBM yesterday 12.  Acute respiratory failure with hypoxia/bilateral pneumonia.  Continue cefepime x 7 days.  Pulmonary services have signed off.              - Ongoing significant secretions with frequent suctioning              - Robitussin 5 mg Q6H             - Biotene PRN   3/8- running Cefepime now- con't 7 days total  3/9- CXR looks good- only mild L sided atelectasis - added Scopolamine due to increased secretions for a few days only  3/12- changed Duonebs back to QID from prn  3/13- improved secretions with Scopolamine - this may be contributing to retention no issues with secretions consider d/c  3/17- Scopolamine stopped  3/20- still using O2 at night- 2L- want sot have at home- knows insurance won't cover it- but wants for airhunger- still using ICS regularly.   3-30: No reports of oxygen use over this weekend    13. L shoulder weakness and pain and new R shoulder pain.  No fracture on xray 2/26.              - Likely due to L C4 myelopathy;  however no accompanying sensory deficit and no other weakness. I suspect a radicular component also  - Monitor with therapies; would consider MRI shoulder for rotator cuff pathology if no improvement   3/13- Pt's main pain is anterior and upper trap on L side- will con't to monitor- doesn't want steroid injections at this time- R shoulder pain started overnight from pulling in bed- appears to be biceps tendinitis- wants to wait on steroids due to concern with hallucinations  3/14- ended up doing steroid injection with 20mg  of kenalog- pain doing better  3/19- pt refused L shoulder MRI  3/21- pt was discussing a magnesium spray- which is fine to try for L shoulder,  Trap pain with substitution for delt weakness, add sportscream  -3/22-23 have discussed left shoulder subluxation--encouraged him to keep arm elevated on pillows to prevent worsening  3/28- getting estim of L deltoid and biceps- doing better  14. Restlessness/psychomotor agitation  3/13- will try Seroquel 12.5 mg at bedtime- d/w TBI MD and she agreed it's better than Depakote.   3/14- increasing seroquel to 25 mg at bedtime  - Sleeping ok, now on seroquel , d/c trazodone, anticholinergic effect    - 3/29: Reported resolved per family and patient  15. 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   I spent a total of 34   minutes on total care today- >50% coordination of care- due to  D/w pt about d/c; plans; and f/u. And BP  LOS: 24 days A FACE TO FACE EVALUATION WAS PERFORMED  Brandon Watts 10/11/2023, 8:40 AM

## 2023-10-11 NOTE — Progress Notes (Signed)
 Inpatient Rehabilitation Care Coordinator Discharge Note   Patient Details  Name: Brandon Watts MRN: [id number] Date of Birth: 05/06/1946   Discharge location: D/c to home with wife  Length of Stay: 23 days  Discharge activity level: ambulatory level Supervision/CGA  Home/community participation: Limited  Patient response [id number] Literacy - How often do you need to have someone help you when you read instructions, pamphlets, or other written material from your doctor or pharmacy?: Never  Patient response [id number] Isolation - How often do you feel lonely or isolated from those around you?: Never  Services provided included: MD, RD, PT, OT, RN, CM, Pharmacy, Neuropsych, SW, TR  Financial Services:  Field seismologist Utilized: Private Insurance [facility] Medicare  Choices offered to/list presented to: patient and wife  Follow-up services arranged:  Outpatient, DME    Outpatient Servicies: [facility] for PT/OT DME : [employer] for TTB, 3in1 BSC, and rollator    Patient response to transportation need: Is the patient able to respond to transportation needs?: Yes In the past 12 months, has lack of transportation kept you from medical appointments or from getting medications?: No In the past 12 months, has lack of transportation kept you from meetings, work, or from getting things needed for daily living?: No   Patient/Family verbalized understanding of follow-up arrangements:  Yes  Individual responsible for coordination of the follow-up plan: contact pt or his wife  Confirmed correct DME delivered: Gretchen Short 10/11/2023    Comments (or additional information):fam edu completed  Summary of Stay    Date/Time Discharge Planning CSW  10/06/23 1448 Pt will d/c to home with his wife. Pt would like to be able as independent as possible within reason. Family meeting held last week. Fam edu scheduled for Friday. SW will confirm there are no barriers to discharge.  AAC  09/28/23 1541 Pt will d/c to home with his wife. Pt would like to be able as independent as possible within reason. SW will confirm there are no barriers to discharge. AAC  09/21/23 1128 Pt will d/c to home with his wife. Pt would like to be able as independent as possible within reason. SW will confirm there are no barriers to discharge. AAC       [name] [name]

## 2023-10-11 NOTE — Telephone Encounter (Signed)
 Pharmacy Patient Advocate Encounter   Received notification from Inpatient Request that prior authorization for Lidocaine 5% patches is required/requested.   Insurance verification completed.   The patient is insured through [employer] .   Per test claim: PA required; PA submitted to above mentioned insurance via CoverMyMeds Key/confirmation #/EOC [id number] Status is pending

## 2023-10-11 NOTE — Progress Notes (Signed)
 Inpatient Rehabilitation Discharge Medication Review by a Pharmacist  A complete drug regimen review was completed for this patient to identify any potential clinically significant medication issues.  High Risk Drug Classes Is patient taking? Indication by Medication  Antipsychotic Yes Quetiapine - mood, agitation  Anticoagulant Yes Apixaban - PE  Antibiotic No   Opioid Yes Tramadol prn pain Oxycontin - pain   Antiplatelet No   Hypoglycemics/insulin No   Vasoactive Medication Yes Midodrine - low BP  Chemotherapy No   Other Yes Baclofen - muscle spasms Finasteride - BPH Gabapentin - pain Azelastine - allergies     Type of Medication Issue Identified Description of Issue Recommendation(s)  Drug Interaction(s) (clinically significant)     Duplicate Therapy     Allergy     No Medication Administration End Date     Incorrect Dose     Additional Drug Therapy Needed     Significant med changes from prior encounter (inform family/care partners about these prior to discharge).    Other       Clinically significant medication issues were identified that warrant physician communication and completion of prescribed/recommended actions by midnight of the next day:  No  Name of provider notified for urgent issues identified:   Provider Method of Notification:     Pharmacist comments: None  Time spent performing this drug regimen review (minutes):  20 minutes  Thank you Okey Regal, PharmD

## 2023-10-12 ENCOUNTER — Encounter: Payer: Self-pay | Admitting: Occupational Therapy

## 2023-10-12 ENCOUNTER — Ambulatory Visit: Attending: Physician Assistant | Admitting: Occupational Therapy

## 2023-10-12 ENCOUNTER — Other Ambulatory Visit: Payer: Self-pay

## 2023-10-12 ENCOUNTER — Encounter: Payer: Self-pay | Admitting: Internal Medicine

## 2023-10-12 ENCOUNTER — Ambulatory Visit: Admitting: Physical Therapy

## 2023-10-12 ENCOUNTER — Encounter: Payer: Self-pay | Admitting: Physical Therapy

## 2023-10-12 DIAGNOSIS — R278 Other lack of coordination: Secondary | ICD-10-CM | POA: Diagnosis not present

## 2023-10-12 DIAGNOSIS — R29818 Other symptoms and signs involving the nervous system: Secondary | ICD-10-CM | POA: Insufficient documentation

## 2023-10-12 DIAGNOSIS — R2681 Unsteadiness on feet: Secondary | ICD-10-CM | POA: Diagnosis not present

## 2023-10-12 DIAGNOSIS — M6281 Muscle weakness (generalized): Secondary | ICD-10-CM | POA: Insufficient documentation

## 2023-10-12 DIAGNOSIS — R2689 Other abnormalities of gait and mobility: Secondary | ICD-10-CM

## 2023-10-12 DIAGNOSIS — R208 Other disturbances of skin sensation: Secondary | ICD-10-CM | POA: Insufficient documentation

## 2023-10-12 DIAGNOSIS — G8252 Quadriplegia, C1-C4 incomplete: Secondary | ICD-10-CM | POA: Insufficient documentation

## 2023-10-12 DIAGNOSIS — M25512 Pain in left shoulder: Secondary | ICD-10-CM | POA: Insufficient documentation

## 2023-10-12 DIAGNOSIS — R29898 Other symptoms and signs involving the musculoskeletal system: Secondary | ICD-10-CM | POA: Diagnosis not present
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
## 2023-10-12 NOTE — Therapy (Signed)
 OUTPATIENT PHYSICAL THERAPY NEURO EVALUATION   Patient Name: JAQUAE RIEVES MRN: [id number] DOB:[birth year], 78 y.o., male Today's Date: 10/12/2023   PCP: Pincus Sanes, MD REFERRING PROVIDER: Charlton Amor, PA-C  END OF SESSION:  PT End of Session - 10/12/23 1023     Visit Number 1    Number of Visits 13    Date for PT Re-Evaluation 12/07/23    Authorization Type [employer] Medicare    PT Start Time 1020   Handoff w/OT   PT Stop Time 1101    PT Time Calculation (min) 41 min    Equipment Utilized During Treatment Cervical collar;Other (comment)   Abdominal binder, Ted hose w/ace wraps   Activity Tolerance Patient tolerated treatment well    Behavior During Therapy WFL for tasks assessed/performed             Past Medical History:  Diagnosis Date   Allergic rhinitis    Arthritis    PAIN AND OA RIGHT HIP   Arthritis    BPH (benign prostatic hyperplasia)    DDD (degenerative disc disease), lumbar    MILD-FOUND ON XRAY   Glaucoma    normotensive type with early changes   Glaucoma    Hepatitis    HEPATITIS C -CLEARED   Hepatitis    Hep C, treated and cleared   HLD (hyperlipidemia)    taking statin as preventative   Hypertension    Insomnia    CHRONIC   Insomnia    Positive PPD, treated    Pulmonary nodules    FOLLOWED BY DR. Marvis Repress. GATES WITH CT EXAMS-MOST RECENT 01/22/12   Status post trigger finger release    right   Past Surgical History:  Procedure Laterality Date   ANTERIOR CERVICAL DECOMP/DISCECTOMY FUSION N/A 09/09/2023   Procedure: ANTERIOR CERVICAL DECOMPRESSION/DISCECTOMY FUSION 2 LEVEL/HARDWARE REMOVAL - C 3-4 , C4-5;  Surgeon: Bedelia Person, MD;  Location: [facility];  Service: Neurosurgery;  Laterality: N/A;   EYE SURGERY     LASIK EYE SURGERY   LITHOTRIPSY     x 3   LITHOTRIPSY     x2   TONSILLECTOMY     AS A CHILD   TONSILLECTOMY     TOTAL HIP ARTHROPLASTY Right 10/25/2012   Procedure: RIGHT TOTAL HIP ARTHROPLASTY ANTERIOR APPROACH;   Surgeon: Shelda Pal, MD;  Location: [facility];  Service: Orthopedics;  Laterality: Right;   TOTAL HIP ARTHROPLASTY Right    TRIGGER FINGER RELEASE Right 2016   third digit right hand. Dr.Supple   URETERAL STONE EXTRACTION S X 2     VASCECTOMY     Patient Active Problem List   Diagnosis Date Noted   Quadriplegia, C1-C4, incomplete (HCC) 09/18/2023   C4 cervical fracture (HCC) 09/17/2023   Central cord syndrome (HCC) 09/17/2023   Dysphagia 09/17/2023   Respiratory failure with hypoxia (HCC) 09/17/2023   SDH (subdural hematoma) (HCC) 09/17/2023   Malnutrition of moderate degree 09/15/2023   Acute pulmonary embolism without acute cor pulmonale (HCC) 09/15/2023   Post-nasal drainage 09/15/2023   Pneumonia of left lower lobe due to infectious organism 09/15/2023   Subdural hematoma (HCC) 09/08/2023   Cervical spine fracture (HCC) 09/08/2023   HTN (hypertension) 09/08/2023   Hyperlipidemia 09/08/2023   BPH (benign prostatic hyperplasia) 09/08/2023   Diastolic dysfunction without heart failure 09/17/2022   Episodic lightheadedness 08/26/2022   Vasovagal near syncope 08/26/2022   Hypercalcemia 08/25/2022   Hyperglycemia 08/25/2022   Acute left-sided low back pain  with left-sided sciatica 10/13/2021   Left renal mass 10/12/2021   Performance anxiety 08/17/2021   Glaucoma 08/14/2021   Insomnia 08/14/2021   Hyperlipoproteinemia 08/14/2021   BPH (benign prostatic hyperplasia) 08/14/2021   Benign essential HTN 08/14/2021   Erectile dysfunction 08/14/2021   Trigger finger, acquired 07/11/2013   S/P right THA, AA 10/25/2012   BACK PAIN, LUMBAR 01/22/2010   NONSPEC REACT TUBERCULIN SKN TEST W/O ACTV TB 06/13/2009   HEPATITIS C 02/12/2007   ALLERGIC RHINITIS 02/12/2007   History of colonic polyps 02/12/2007    ONSET DATE: 10/08/2023 (referral)   REFERRING DIAG: G82.52 (ICD-10-CM) - Quadriplegia, C1-C4 incomplete S12.300A (ICD-10-CM) - Unspecified displaced fracture of fourth cervical  vertebra, initial encounter for closed fracture S14.129A (ICD-10-CM) - Central cord syndrome at unspecified level of cervical spinal cord, initial encounter  THERAPY DIAG:  Quadriplegia, C1-C4 incomplete (HCC)  Unsteadiness on feet  Other abnormalities of gait and mobility  Muscle weakness (generalized)  Rationale for Evaluation and Treatment: Rehabilitation  SUBJECTIVE:                                                                                                                                                                                             SUBJECTIVE STATEMENT: Pt received from OT, wearing cervical collar and using rollator. Pt reports he was DC from CIR yesterday. Had some difficulty getting into/out of bed last night as he was used to getting in on R side in the hospital but has to get into the L side at home. Has a "short leash" w/his catheter, which is anchored to his RLE. Will try to anchor it to his LLE instead. Pt is an internal med MD. Reports he is more of a central cord contusion, as his arms are more affected than his legs. Pt reports he had a history of spinal stenosis prior to his accident and having two herniated discs caused extra pressure on his spinal cord. States he is a C3-C5. Has been struggling w/hypersensitivity, which has improved since his initial injury but light touch is still painful. RLE feels weaker than LLE and he has stocking paresthesia in BLEs. Currently wearing ted hose bilaterally w/ace wraps over them as well as an abdominal binder.    Pt accompanied by:  Wife, Olegario Messier   PERTINENT HISTORY: HTN, HLD, BPH, Glaucoma, Hx of hep C s/p treatment  PAIN:  Are you having pain? Yes: NPRS scale: 2-3/10 Pain location: Bilateral shoulder girdles L > R  Pain description: Achy  Aggravating factors: Moving arm Relieving factors: Pain meds  PRECAUTIONS: Cervical, Fall, and Other: No lifting over 5#  RED FLAGS: None  WEIGHT BEARING RESTRICTIONS:  No  FALLS: Has patient fallen in last 6 months? No  LIVING ENVIRONMENT: Lives with: lives with their spouse Lives in: House/apartment Stairs: Yes: Internal: 16 steps; on right going up, on left going up, and can reach both and External: 3 + 1 steps; on right going up, on left going up, and can reach both Has following equipment at home: Dan Humphreys - 4 wheeled, Shower bench, and bed side commode  PLOF: Independent  PATIENT GOALS: "To be able to hike in the [place] in August and in [place] in 07/2024. I would like to be able to play the cello again and walk my dog along the trails in about a month"   OBJECTIVE:  Note: Objective measures were completed at Evaluation unless otherwise noted.  DIAGNOSTIC FINDINGS: MRI of C-spine from 09/08/2023  IMPRESSION: 1. Signal abnormality within the ventral spinal cord at the C3-C4 level likely reflecting cord contusion. 2. Edema at site of a known acute fracture within the C4 spinous process. 3. Ligamentum flavum irregularity at C4-C5 consistent with ligamentum flavum injury, and possible ligamentum flavum disruption. 4. Edema signal within the interspinous spaces at C3-C4, C4-C5, C5-C6, C6-C7 and C7-T1 consistent with interspinous ligament injury. 5. Thinned appearance of the anterior longitudinal ligament at the C3 level suspicious for anterior longitudinal ligament injury (without complete ligamentous disruption). 6. Prevertebral edema/hematoma spanning the C2-C6 levels. 7. Cervical spondylosis as outlined within the body of the report. Moderate spinal canal stenosis at C3-C4 and C4-C5. Multilevel foraminal stenosis, greatest bilaterally at C3-C4 (moderate/severe) and on the left at C4-C5 (moderate). 8. Vertebral body and left-sided facet ankylosis at C2-C3.  COGNITION: Overall cognitive status: Within functional limits for tasks assessed   SENSATION: Stocking numbness in BLEs   POSTURE: rounded shoulders and pt wearing cervical  collar  LOWER EXTREMITY ROM:     Active  Right Eval Left Eval  Hip flexion    Hip extension    Hip abduction    Hip adduction    Hip internal rotation    Hip external rotation    Knee flexion    Knee extension    Ankle dorsiflexion    Ankle plantarflexion    Ankle inversion    Ankle eversion     (Blank rows = not tested)  LOWER EXTREMITY MMT:  Tested in seated position   MMT Right Eval Left Eval  Hip flexion 4- 4-  Hip extension    Hip abduction 5 5  Hip adduction 5 5  Hip internal rotation    Hip external rotation    Knee flexion 5 5  Knee extension 5 5  Ankle dorsiflexion 5 5  Ankle plantarflexion    Ankle inversion    Ankle eversion    (Blank rows = not tested)  BED MOBILITY:  Pt reports he needs more support to get out of his bed at home due to having to get out on the L side (was getting out on the R side in the hospital) and has difficulty propping up on L shoulder due to pain  TRANSFERS: Assistive device utilized: Environmental consultant - 4 wheeled  Sit to stand: SBA Stand to sit: SBA Chair to chair: SBA Noted decreased anterior weight shift initially w/minor lateral shift to L side. Poor eccentric control w/fatigue   STAIRS: Level of Assistance: SBA Stair Negotiation Technique: Alternating Pattern  with Bilateral Rails Number of Stairs: 4  Height of Stairs: 6"  Comments: Decreased eccentric control on RLE   GAIT: Gait  pattern: step through pattern, lateral hip instability, trunk flexed, and wide BOS Distance walked: Various clinic distances  Assistive device utilized: Environmental consultant - 4 wheeled and None Level of assistance: Modified independence and SBA Comments: Pt is mod I w/rollator and SBA w/no AD due to anterior instability and decreased visual scanning ability   FUNCTIONAL TESTS:   [facility] PT Assessment - 10/12/23 1050       Transfers   Five time sit to stand comments  14.5s   No UE support, mild sway to L side, mild retropulsion, poor eccentric control      Ambulation/Gait   Gait velocity 0.98 m/s   8m over 10.25s no AD w/SBA                                                                                                                   TREATMENT :  N/A eval    PATIENT EDUCATION: Education details: POC, eval findings  Person educated: Patient and Spouse Education method: Explanation Education comprehension: verbalized understanding  HOME EXERCISE PROGRAM: From CIR: [id number]  GOALS: Goals reviewed with patient? Yes  SHORT TERM GOALS: Target date: 11/09/2023   Pt will be independent with initial HEP for improved strength, balance, transfers and gait.  Baseline: Goal status: INITIAL  2.  MCTSIB to be assessed and STG/LTG updated  Baseline:  Goal status: INITIAL  3.  to be assessed and LTG updated  Baseline:  Goal status: INITIAL  4.  Modified FGA to be assessed and LTG updated  Baseline:  Goal status: INITIAL  LONG TERM GOALS: Target date: 11/23/2023   Modified FGA goal  Baseline:  Goal status: INITIAL  2.  goal  Baseline:  Goal status: INITIAL  3.  MCTSIB goal  Baseline:  Goal status: INITIAL  4.  Pt will return to walking dog on trail w/SBA and no instability for return to PLOF and improved independence  Baseline:  Goal status: INITIAL   ASSESSMENT:  CLINICAL IMPRESSION: Patient is a 78 year old male referred to Neuro OPPT for quadriplegia. Pt's PMH is significant for: HTN, HLD, BPH, Glaucoma, Hx of hep C s/p treatment. The following deficits were present during the exam: decreased functional strength, impaired sensation, orthostatic hypotension and impaired balance. Based on impaired sensation and quadriplegia, pt is an incr risk for falls. Pt would benefit from skilled PT to address these impairments and functional limitations to maximize functional mobility independence.   OBJECTIVE IMPAIRMENTS: Abnormal gait, decreased activity tolerance, decreased balance, decreased coordination,  decreased endurance, decreased mobility, decreased strength, dizziness, impaired sensation, impaired UE functional use, improper body mechanics, and pain  ACTIVITY LIMITATIONS: carrying, lifting, bending, squatting, transfers, bed mobility, toileting, dressing, reach over head, hygiene/grooming, locomotion level, and caring for others  PARTICIPATION LIMITATIONS: meal prep, cleaning, laundry, medication management, driving, shopping, community activity, occupation, and yard work  PERSONAL FACTORS: Fitness, Past/current experiences, and 1 comorbidity: quadriplegia  are also affecting patient's functional outcome.   REHAB POTENTIAL: Excellent  CLINICAL DECISION MAKING: Evolving/moderate complexity  EVALUATION COMPLEXITY: Moderate  PLAN:  PT FREQUENCY: 2x/week  PT DURATION: 6 weeks  PLANNED INTERVENTIONS: 97164- PT Re-evaluation, 97110-Therapeutic exercises, 97530- Therapeutic activity, 97112- Neuromuscular re-education, 97535- Self Care, 40981- Manual therapy, [phone]- Gait training, [phone]- Orthotic Fit/training, [phone]- Aquatic Therapy, [phone]- Electrical stimulation (manual), Patient/Family education, Balance training, Stair training, Dry Needling, Joint mobilization, Spinal mobilization, Vestibular training, and DME instructions  PLAN FOR NEXT SESSION: MCTSIB, and modified FGA (no head turns) and update goals. Review HEP from CIR and update as appropriate. Work on return to hiking.    Jill Alexanders Teyonna Plaisted, PT, DPT 10/12/2023, 1:28 PM

## 2023-10-13 DIAGNOSIS — M4712 Other spondylosis with myelopathy, cervical region: Secondary | ICD-10-CM | POA: Diagnosis not present

## 2023-10-15 ENCOUNTER — Other Ambulatory Visit (HOSPITAL_COMMUNITY): Payer: Self-pay

## 2023-10-15 MED ORDER — MORPHINE SULFATE ER 15 MG PO TBCR
15.0000 mg | EXTENDED_RELEASE_TABLET | Freq: Two times a day (BID) | ORAL | 0 refills | Status: DC
  Filled 2023-10-15: qty 60, 30d supply, fill #0

## 2023-10-16 ENCOUNTER — Other Ambulatory Visit (HOSPITAL_COMMUNITY): Payer: Self-pay

## 2023-10-18 ENCOUNTER — Ambulatory Visit: Admitting: Occupational Therapy

## 2023-10-18 ENCOUNTER — Telehealth: Payer: Self-pay | Admitting: Internal Medicine

## 2023-10-18 ENCOUNTER — Encounter: Payer: Self-pay | Admitting: Physical Therapy

## 2023-10-18 ENCOUNTER — Ambulatory Visit: Admitting: Physical Therapy

## 2023-10-18 DIAGNOSIS — R29818 Other symptoms and signs involving the nervous system: Secondary | ICD-10-CM | POA: Diagnosis not present

## 2023-10-18 DIAGNOSIS — R29898 Other symptoms and signs involving the musculoskeletal system: Secondary | ICD-10-CM | POA: Diagnosis not present

## 2023-10-18 DIAGNOSIS — R2681 Unsteadiness on feet: Secondary | ICD-10-CM | POA: Diagnosis not present

## 2023-10-18 DIAGNOSIS — M6281 Muscle weakness (generalized): Secondary | ICD-10-CM

## 2023-10-18 DIAGNOSIS — M25512 Pain in left shoulder: Secondary | ICD-10-CM | POA: Diagnosis not present

## 2023-10-18 DIAGNOSIS — R278 Other lack of coordination: Secondary | ICD-10-CM | POA: Diagnosis not present

## 2023-10-18 DIAGNOSIS — R2689 Other abnormalities of gait and mobility: Secondary | ICD-10-CM

## 2023-10-18 DIAGNOSIS — G8252 Quadriplegia, C1-C4 incomplete: Secondary | ICD-10-CM | POA: Diagnosis not present

## 2023-10-18 DIAGNOSIS — R208 Other disturbances of skin sensation: Secondary | ICD-10-CM | POA: Diagnosis not present

## 2023-10-18 MED ORDER — MORPHINE SULFATE ER 15 MG PO TBCR: 15.0000 mg | EXTENDED_RELEASE_TABLET | Freq: Two times a day (BID) | ORAL | 0 refills | Status: DC

## 2023-10-18 NOTE — Addendum Note (Signed)
 Addended by: Pincus Sanes on: 10/18/2023 09:18 AM   Modules accepted: Orders

## 2023-10-18 NOTE — Telephone Encounter (Signed)
 Copied from CRM [id number]. Topic: Clinical - Medication Refill >> Oct 18, 2023  8:23 AM Ivette P wrote: Most Recent Primary Care Visit:  Provider: [facility]  Department: [facility]  Visit Type: LAB  Date: 12/22/2022  Medication: morphine (MS CONTIN) 15 MG 12 hr tablet  Has the patient contacted their pharmacy? Yes (Agent: If no, request that the patient contact the pharmacy for the refill. If patient does not wish to contact the pharmacy document the reason why and proceed with request.) (Agent: If yes, when and what did the pharmacy advise?)  Is this the correct pharmacy for this prescription? Yes If no, delete pharmacy and type the correct one.  This is the patient's preferred pharmacy:  [facility] [place], [place] - [address] [place] [address] Phone: [phone] Fax: [phone]   Has the prescription been filled recently? Yes, 10/15/2023 - pharmacy that was sent to is unable to supply   Is the patient out of the medication? Yes  Has the patient been seen for an appointment in the last year OR does the patient have an upcoming appointment? Yes  Can we respond through MyChart? Yes  Agent: Please be advised that Rx refills may take up to 3 business days. We ask that you follow-up with your pharmacy.
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
## 2023-10-18 NOTE — Telephone Encounter (Signed)
 E receipt 9:29 10/18/23. Spoke with Bonita Quin at [facility], she confirms that order was received and has been picked up by patient.

## 2023-10-18 NOTE — Therapy (Signed)
 OUTPATIENT PHYSICAL THERAPY NEURO EVALUATION   Patient Name: Brandon Watts MRN: [id number] DOB:[birth year], 78 y.o., male Today's Date: 10/18/2023   PCP: Pincus Sanes, MD REFERRING PROVIDER: Charlton Amor, PA-C  END OF SESSION:  PT End of Session - 10/18/23 0951     Visit Number 2    Number of Visits 13    Date for PT Re-Evaluation 12/07/23    Authorization Type [employer] Medicare    PT Start Time [phone]   Primary PT out - treating PT unaware that pt had been rescheduled onto schedule - delayed start in treatment session   PT Stop Time 0930    PT Time Calculation (min) 35 min    Equipment Utilized During Treatment Cervical collar;Other (comment)   Abdominal binder, Ted hose w/ace wraps   Activity Tolerance Patient tolerated treatment well    Behavior During Therapy WFL for tasks assessed/performed              Past Medical History:  Diagnosis Date   Allergic rhinitis    Arthritis    PAIN AND OA RIGHT HIP   Arthritis    BPH (benign prostatic hyperplasia)    DDD (degenerative disc disease), lumbar    MILD-FOUND ON XRAY   Glaucoma    normotensive type with early changes   Glaucoma    Hepatitis    HEPATITIS C -CLEARED   Hepatitis    Hep C, treated and cleared   HLD (hyperlipidemia)    taking statin as preventative   Hypertension    Insomnia    CHRONIC   Insomnia    Positive PPD, treated    Pulmonary nodules    FOLLOWED BY DR. Marvis Repress. GATES WITH CT EXAMS-MOST RECENT 01/22/12   Status post trigger finger release    right   Past Surgical History:  Procedure Laterality Date   ANTERIOR CERVICAL DECOMP/DISCECTOMY FUSION N/A 09/09/2023   Procedure: ANTERIOR CERVICAL DECOMPRESSION/DISCECTOMY FUSION 2 LEVEL/HARDWARE REMOVAL - C 3-4 , C4-5;  Surgeon: Bedelia Person, MD;  Location: [facility];  Service: Neurosurgery;  Laterality: N/A;   EYE SURGERY     LASIK EYE SURGERY   LITHOTRIPSY     x 3   LITHOTRIPSY     x2   TONSILLECTOMY     AS A CHILD   TONSILLECTOMY      TOTAL HIP ARTHROPLASTY Right 10/25/2012   Procedure: RIGHT TOTAL HIP ARTHROPLASTY ANTERIOR APPROACH;  Surgeon: Shelda Pal, MD;  Location: [facility];  Service: Orthopedics;  Laterality: Right;   TOTAL HIP ARTHROPLASTY Right    TRIGGER FINGER RELEASE Right 2016   third digit right hand. Dr.Supple   URETERAL STONE EXTRACTION S X 2     VASCECTOMY     Patient Active Problem List   Diagnosis Date Noted   Quadriplegia, C1-C4, incomplete (HCC) 09/18/2023   C4 cervical fracture (HCC) 09/17/2023   Central cord syndrome (HCC) 09/17/2023   Dysphagia 09/17/2023   Respiratory failure with hypoxia (HCC) 09/17/2023   SDH (subdural hematoma) (HCC) 09/17/2023   Malnutrition of moderate degree 09/15/2023   Acute pulmonary embolism without acute cor pulmonale (HCC) 09/15/2023   Post-nasal drainage 09/15/2023   Pneumonia of left lower lobe due to infectious organism 09/15/2023   Subdural hematoma (HCC) 09/08/2023   Cervical spine fracture (HCC) 09/08/2023   HTN (hypertension) 09/08/2023   Hyperlipidemia 09/08/2023   BPH (benign prostatic hyperplasia) 09/08/2023   Diastolic dysfunction without heart failure 09/17/2022   Episodic lightheadedness 08/26/2022  Vasovagal near syncope 08/26/2022   Hypercalcemia 08/25/2022   Hyperglycemia 08/25/2022   Acute left-sided low back pain with left-sided sciatica 10/13/2021   Left renal mass 10/12/2021   Performance anxiety 08/17/2021   Glaucoma 08/14/2021   Insomnia 08/14/2021   Hyperlipoproteinemia 08/14/2021   BPH (benign prostatic hyperplasia) 08/14/2021   Benign essential HTN 08/14/2021   Erectile dysfunction 08/14/2021   Trigger finger, acquired 07/11/2013   S/P right THA, AA 10/25/2012   BACK PAIN, LUMBAR 01/22/2010   NONSPEC REACT TUBERCULIN SKN TEST W/O ACTV TB 06/13/2009   HEPATITIS C 02/12/2007   ALLERGIC RHINITIS 02/12/2007   History of colonic polyps 02/12/2007    ONSET DATE: 10/08/2023 (referral)   REFERRING DIAG: G82.52 (ICD-10-CM)  - Quadriplegia, C1-C4 incomplete S12.300A (ICD-10-CM) - Unspecified displaced fracture of fourth cervical vertebra, initial encounter for closed fracture S14.129A (ICD-10-CM) - Central cord syndrome at unspecified level of cervical spinal cord, initial encounter  THERAPY DIAG:  Other abnormalities of gait and mobility  Muscle weakness (generalized)  Unsteadiness on feet  Rationale for Evaluation and Treatment: Rehabilitation  SUBJECTIVE:                                                                                                                                                                                             SUBJECTIVE STATEMENT:  Pt states he walked 1000' yesterday in [employer] "freehand" without walker with wife pushing rollator, to have if needed for seated rest period.  Pt has brought notebook of exercises issued by [facility] for update and progression.  Pt reports he sees neurosurgeon on Wed. this week and may be able to remove hard cervical collar and replace with soft cervical collar at that appt.  Pt accompanied by:  Wife, Olegario Messier   PERTINENT HISTORY: HTN, HLD, BPH, Glaucoma, Hx of hep C s/p treatment  PAIN:  Are you having pain? Yes: NPRS scale: 2-3/10; with movement 4/10 LUE  Pain location: Bilateral shoulder girdles L > R  Pain description: Achy  Aggravating factors: Moving arm Relieving factors: Pain meds  PRECAUTIONS: Cervical, Fall, and Other: No lifting over 5#  RED FLAGS: None   WEIGHT BEARING RESTRICTIONS: No  FALLS: Has patient fallen in last 6 months? No  LIVING ENVIRONMENT: Lives with: lives with their spouse Lives in: House/apartment Stairs: Yes: Internal: 16 steps; on right going up, on left going up, and can reach both and External: 3 + 1 steps; on right going up, on left going up, and can reach both Has following equipment at home: Walker - 4 wheeled, Shower bench, and bed side commode  PLOF: Independent  PATIENT GOALS: "To  be able to  hike in the [place] in August and in [place] in 07/2024. I would like to be able to play the cello again and walk my dog along the trails in about a month"   OBJECTIVE:  Note: Objective measures were completed at Evaluation unless otherwise noted.  DIAGNOSTIC FINDINGS: MRI of C-spine from 09/08/2023  IMPRESSION: 1. Signal abnormality within the ventral spinal cord at the C3-C4 level likely reflecting cord contusion. 2. Edema at site of a known acute fracture within the C4 spinous process. 3. Ligamentum flavum irregularity at C4-C5 consistent with ligamentum flavum injury, and possible ligamentum flavum disruption. 4. Edema signal within the interspinous spaces at C3-C4, C4-C5, C5-C6, C6-C7 and C7-T1 consistent with interspinous ligament injury. 5. Thinned appearance of the anterior longitudinal ligament at the C3 level suspicious for anterior longitudinal ligament injury (without complete ligamentous disruption). 6. Prevertebral edema/hematoma spanning the C2-C6 levels. 7. Cervical spondylosis as outlined within the body of the report. Moderate spinal canal stenosis at C3-C4 and C4-C5. Multilevel foraminal stenosis, greatest bilaterally at C3-C4 (moderate/severe) and on the left at C4-C5 (moderate). 8. Vertebral body and left-sided facet ankylosis at C2-C3.  COGNITION: Overall cognitive status: Within functional limits for tasks assessed   SENSATION: Stocking numbness in BLEs   POSTURE: rounded shoulders and pt wearing cervical collar  LOWER EXTREMITY ROM:     Active  Right Eval Left Eval  Hip flexion    Hip extension    Hip abduction    Hip adduction    Hip internal rotation    Hip external rotation    Knee flexion    Knee extension    Ankle dorsiflexion    Ankle plantarflexion    Ankle inversion    Ankle eversion     (Blank rows = not tested)  LOWER EXTREMITY MMT:  Tested in seated position   MMT Right Eval Left Eval  Hip flexion 4- 4-  Hip extension     Hip abduction 5 5  Hip adduction 5 5  Hip internal rotation    Hip external rotation    Knee flexion 5 5  Knee extension 5 5  Ankle dorsiflexion 5 5  Ankle plantarflexion    Ankle inversion    Ankle eversion    (Blank rows = not tested)  BED MOBILITY:  Pt reports he needs more support to get out of his bed at home due to having to get out on the L side (was getting out on the R side in the hospital) and has difficulty propping up on L shoulder due to pain  TRANSFERS: Assistive device utilized: Environmental consultant - 4 wheeled  Sit to stand: SBA Stand to sit: SBA Chair to chair: SBA Noted decreased anterior weight shift initially w/minor lateral shift to L side. Poor eccentric control w/fatigue   STAIRS: Level of Assistance: SBA Stair Negotiation Technique: Alternating Pattern  with Bilateral Rails Number of Stairs: 4  Height of Stairs: 6"  Comments: Decreased eccentric control on RLE   GAIT: Gait pattern: step through pattern, lateral hip instability, trunk flexed, and wide BOS Distance walked: Various clinic distances  Assistive device utilized: Environmental consultant - 4 wheeled and None Level of assistance: Modified independence and SBA Comments: Pt is mod I w/rollator and SBA w/no AD due to anterior instability and decreased visual scanning ability   FUNCTIONAL TESTS:  TREATMENT : 10-18-23  Gait: 6" walk test - pt amb. 1282.10' in 6" without device with SBA, on flat, even surface - no seated rest period needed  NeuroRe-ed: mCTSIB - condition 1 - 30 secs; condition 2 - 30 secs:  condition 3 - 30 secs;  condition 4 - 30 secs with moderate postural sway; pt's Rt elbow touched wall very briefly and lightly at 4 secs but pt recovered and stood for 30 secs without touching walls - moderate postural sway but no LOB  TherEx; Sit to stand from mat without UE support 5 reps  Supine - SLR with 2#  weight 10 reps RLE, 10 reps LLE               Hip flexion in hooklying  with 2# weight 10 reps RLE and 10 reps LLE  Standing - unilateral heel raise 10 reps each leg - 3-4 sec hold - 10 reps each leg  Updated HEP: Access Code: [email or web address] URL: [email or web address] Date: 10/18/2023 Prepared by: Maebelle Munroe  Exercises - Sit to Stand  - 1 x daily - 7 x weekly - 2 sets - 10 reps - Supine Active Straight Leg Raise  - 1 x daily - 7 x weekly - 3 sets - 10 reps - with 2# weight - Single Leg Heel Raise  - 1 x daily - 7 x weekly - 2 sets - 10 reps - 3 sec hold - also added hip flexion in hooklying with 2# weight - used picture on initial HEP and highlighted exercise  PATIENT EDUCATION: Education details: updated HEP - added exercises - sit to stand, SLR, hip flexion in hooklying, unilateral heel raise - see above Person educated: Patient and Spouse Education method: Explanation Education comprehension: verbalized understanding  HOME EXERCISE PROGRAM: From CIR: [email or web address]; UPDATED 10-18-23;   GOALS: Goals reviewed with patient? Yes  SHORT TERM GOALS: Target date: 11/09/2023   Pt will be independent with initial HEP for improved strength, balance, transfers and gait.  Baseline: Goal status: INITIAL  2.  MCTSIB to be assessed and STG/LTG updated ;  hold condition 4 on mCTSIB with minimal to no postural sway for improved balance. (SD) Baseline: 30 sec hold with moderate postural sway Goal status: INITIAL  3.  to be assessed and LTG updated  Baseline: 1282.10' Goal status: Goal met 10-18-23  4.  Modified FGA to be assessed and LTG updated  Baseline:  Goal status: INITIAL  LONG TERM GOALS: Target date: 11/23/2023   Modified FGA goal  Baseline:  Goal status: INITIAL  2.  goal - increase distance to >1400' for increased endurance/gait efficiency. (SD) Baseline:  Goal status: INITIAL  3.  MCTSIB goal -  hold condition 4 on mCTSIB with minimal to no postural  sway for improved balance. (SD) Baseline:  Goal status: INITIAL  4.  Pt will return to walking dog on trail w/SBA and no instability for return to PLOF and improved independence  Baseline:  Goal status: INITIAL   ASSESSMENT:  CLINICAL IMPRESSION: PT session focused on assessment of mCTSIB and 6" walk test, which pt was able to amb. 1282' without device and without c/o fatigue, with no seated rest period needed.  Pt had moderate postural sway on condition 4 of mCTSIB, indicative of mildly decreased vestibular input in maintaining balance.  Remainder of session focused on updating and progressing HEP with 2# weight used for SLR and hip flexion in hooklying.  Pt is progressing well towards goals.  Cont  with POC.   OBJECTIVE IMPAIRMENTS: Abnormal gait, decreased activity tolerance, decreased balance, decreased coordination, decreased endurance, decreased mobility, decreased strength, dizziness, impaired sensation, impaired UE functional use, improper body mechanics, and pain  ACTIVITY LIMITATIONS: carrying, lifting, bending, squatting, transfers, bed mobility, toileting, dressing, reach over head, hygiene/grooming, locomotion level, and caring for others  PARTICIPATION LIMITATIONS: meal prep, cleaning, laundry, medication management, driving, shopping, community activity, occupation, and yard work  PERSONAL FACTORS: Fitness, Past/current experiences, and 1 comorbidity: quadriplegia  are also affecting patient's functional outcome.   REHAB POTENTIAL: Excellent  CLINICAL DECISION MAKING: Evolving/moderate complexity  EVALUATION COMPLEXITY: Moderate  PLAN:  PT FREQUENCY: 2x/week  PT DURATION: 6 weeks  PLANNED INTERVENTIONS: 97164- PT Re-evaluation, 97110-Therapeutic exercises, 97530- Therapeutic activity, 97112- Neuromuscular re-education, 97535- Self Care, 29562- Manual therapy, [phone]- Gait training, [phone]- Orthotic Fit/training, [phone]- Aquatic Therapy, [phone]- Electrical stimulation  (manual), Patient/Family education, Balance training, Stair training, Dry Needling, Joint mobilization, Spinal mobilization, Vestibular training, and DME instructions  PLAN FOR NEXT SESSION: modified FGA (no head turns) (not done on 4-7 due to time constraint) and update goals - Work on return to hiking.    Kary Kos, PT 10/18/2023, 9:55 AM

## 2023-10-20 ENCOUNTER — Ambulatory Visit: Admitting: Physical Therapy

## 2023-10-20 ENCOUNTER — Ambulatory Visit: Admitting: Occupational Therapy

## 2023-10-20 VITALS — BP 80/49 | HR 72

## 2023-10-20 DIAGNOSIS — M6281 Muscle weakness (generalized): Secondary | ICD-10-CM

## 2023-10-20 DIAGNOSIS — R208 Other disturbances of skin sensation: Secondary | ICD-10-CM | POA: Diagnosis not present

## 2023-10-20 DIAGNOSIS — R278 Other lack of coordination: Secondary | ICD-10-CM

## 2023-10-20 DIAGNOSIS — R29818 Other symptoms and signs involving the nervous system: Secondary | ICD-10-CM

## 2023-10-20 DIAGNOSIS — R2689 Other abnormalities of gait and mobility: Secondary | ICD-10-CM

## 2023-10-20 DIAGNOSIS — R2681 Unsteadiness on feet: Secondary | ICD-10-CM

## 2023-10-20 DIAGNOSIS — R29898 Other symptoms and signs involving the musculoskeletal system: Secondary | ICD-10-CM | POA: Diagnosis not present

## 2023-10-20 DIAGNOSIS — G8252 Quadriplegia, C1-C4 incomplete: Secondary | ICD-10-CM

## 2023-10-20 DIAGNOSIS — M25512 Pain in left shoulder: Secondary | ICD-10-CM | POA: Diagnosis not present

## 2023-10-20 NOTE — Therapy (Signed)
 OUTPATIENT PHYSICAL THERAPY NEURO TREATMENT   Patient Name: Brandon Watts MRN: [id number] DOB:1946-02-02, 78 y.o., male Today's Date: 10/20/2023   PCP: Pincus Sanes, MD REFERRING PROVIDER: Charlton Amor, PA-C  END OF SESSION:  PT End of Session - 10/20/23 0933     Visit Number 3    Number of Visits 13    Date for PT Re-Evaluation 12/07/23    Authorization Type [employer] Medicare    PT Start Time 0931    PT Stop Time 1018    PT Time Calculation (min) 47 min    Equipment Utilized During Treatment Cervical collar;Other (comment)   Abdominal binder, Ted hose w/ace wraps   Activity Tolerance Patient tolerated treatment well    Behavior During Therapy WFL for tasks assessed/performed               Past Medical History:  Diagnosis Date   Allergic rhinitis    Arthritis    PAIN AND OA RIGHT HIP   Arthritis    BPH (benign prostatic hyperplasia)    DDD (degenerative disc disease), lumbar    MILD-FOUND ON XRAY   Glaucoma    normotensive type with early changes   Glaucoma    Hepatitis    HEPATITIS C -CLEARED   Hepatitis    Hep C, treated and cleared   HLD (hyperlipidemia)    taking statin as preventative   Hypertension    Insomnia    CHRONIC   Insomnia    Positive PPD, treated    Pulmonary nodules    FOLLOWED BY DR. Marvis Repress. GATES WITH CT EXAMS-MOST RECENT 01/22/12   Status post trigger finger release    right   Past Surgical History:  Procedure Laterality Date   ANTERIOR CERVICAL DECOMP/DISCECTOMY FUSION N/A 09/09/2023   Procedure: ANTERIOR CERVICAL DECOMPRESSION/DISCECTOMY FUSION 2 LEVEL/HARDWARE REMOVAL - C 3-4 , C4-5;  Surgeon: Bedelia Person, MD;  Location: [facility];  Service: Neurosurgery;  Laterality: N/A;   EYE SURGERY     LASIK EYE SURGERY   LITHOTRIPSY     x 3   LITHOTRIPSY     x2   TONSILLECTOMY     AS A CHILD   TONSILLECTOMY     TOTAL HIP ARTHROPLASTY Right 10/25/2012   Procedure: RIGHT TOTAL HIP ARTHROPLASTY ANTERIOR APPROACH;  Surgeon:  Shelda Pal, MD;  Location: [facility];  Service: Orthopedics;  Laterality: Right;   TOTAL HIP ARTHROPLASTY Right    TRIGGER FINGER RELEASE Right 2016   third digit right hand. Dr.Supple   URETERAL STONE EXTRACTION S X 2     VASCECTOMY     Patient Active Problem List   Diagnosis Date Noted   Quadriplegia, C1-C4, incomplete (HCC) 09/18/2023   C4 cervical fracture (HCC) 09/17/2023   Central cord syndrome (HCC) 09/17/2023   Dysphagia 09/17/2023   Respiratory failure with hypoxia (HCC) 09/17/2023   SDH (subdural hematoma) (HCC) 09/17/2023   Malnutrition of moderate degree 09/15/2023   Acute pulmonary embolism without acute cor pulmonale (HCC) 09/15/2023   Post-nasal drainage 09/15/2023   Pneumonia of left lower lobe due to infectious organism 09/15/2023   Subdural hematoma (HCC) 09/08/2023   Cervical spine fracture (HCC) 09/08/2023   HTN (hypertension) 09/08/2023   Hyperlipidemia 09/08/2023   BPH (benign prostatic hyperplasia) 09/08/2023   Diastolic dysfunction without heart failure 09/17/2022   Episodic lightheadedness 08/26/2022   Vasovagal near syncope 08/26/2022   Hypercalcemia 08/25/2022   Hyperglycemia 08/25/2022   Acute left-sided low back pain with  left-sided sciatica 10/13/2021   Left renal mass 10/12/2021   Performance anxiety 08/17/2021   Glaucoma 08/14/2021   Insomnia 08/14/2021   Hyperlipoproteinemia 08/14/2021   BPH (benign prostatic hyperplasia) 08/14/2021   Benign essential HTN 08/14/2021   Erectile dysfunction 08/14/2021   Trigger finger, acquired 07/11/2013   S/P right THA, AA 10/25/2012   BACK PAIN, LUMBAR 01/22/2010   NONSPEC REACT TUBERCULIN SKN TEST W/O ACTV TB 06/13/2009   HEPATITIS C 02/12/2007   ALLERGIC RHINITIS 02/12/2007   History of colonic polyps 02/12/2007    ONSET DATE: 10/08/2023 (referral)   REFERRING DIAG: G82.52 (ICD-10-CM) - Quadriplegia, C1-C4 incomplete S12.300A (ICD-10-CM) - Unspecified displaced fracture of fourth cervical vertebra,  initial encounter for closed fracture S14.129A (ICD-10-CM) - Central cord syndrome at unspecified level of cervical spinal cord, initial encounter  THERAPY DIAG:  Muscle weakness (generalized)  Unsteadiness on feet  Quadriplegia, C1-C4 incomplete (HCC)  Other abnormalities of gait and mobility  Rationale for Evaluation and Treatment: Rehabilitation  SUBJECTIVE:                                                                                                                                                                                             SUBJECTIVE STATEMENT:  Pt presents w/soft collar. States he is now wearing soft collar until he sees Careers adviser on 5/7. Went to [employer] [facility] this past week and did not wear his abdominal binder. Felt "off" but nothing happened. Had a lot of pain this morning but is feeling better now.   Pt accompanied by:  Wife, Olegario Messier   PERTINENT HISTORY: HTN, HLD, BPH, Glaucoma, Hx of hep C s/p treatment  PAIN:  Are you having pain? Yes: NPRS scale: 2-3/10; with movement 4/10 LUE  Pain location: Bilateral shoulder girdles L > R  Pain description: Achy  Aggravating factors: Moving arm Relieving factors: Pain meds  PRECAUTIONS: Cervical, Fall, and Other: No lifting over 5#  RED FLAGS: None   WEIGHT BEARING RESTRICTIONS: No  FALLS: Has patient fallen in last 6 months? No  LIVING ENVIRONMENT: Lives with: lives with their spouse Lives in: House/apartment Stairs: Yes: Internal: 16 steps; on right going up, on left going up, and can reach both and External: 3 + 1 steps; on right going up, on left going up, and can reach both Has following equipment at home: Dan Humphreys - 4 wheeled, Shower bench, and bed side commode  PLOF: Independent  PATIENT GOALS: "To be able to hike in the [place] in August and in [place] in 07/2024. I would like to be able to play the cello again and walk my dog along the trails  in about a month"   OBJECTIVE:  Note: Objective  measures were completed at Evaluation unless otherwise noted.  DIAGNOSTIC FINDINGS: MRI of C-spine from 09/08/2023  IMPRESSION: 1. Signal abnormality within the ventral spinal cord at the C3-C4 level likely reflecting cord contusion. 2. Edema at site of a known acute fracture within the C4 spinous process. 3. Ligamentum flavum irregularity at C4-C5 consistent with ligamentum flavum injury, and possible ligamentum flavum disruption. 4. Edema signal within the interspinous spaces at C3-C4, C4-C5, C5-C6, C6-C7 and C7-T1 consistent with interspinous ligament injury. 5. Thinned appearance of the anterior longitudinal ligament at the C3 level suspicious for anterior longitudinal ligament injury (without complete ligamentous disruption). 6. Prevertebral edema/hematoma spanning the C2-C6 levels. 7. Cervical spondylosis as outlined within the body of the report. Moderate spinal canal stenosis at C3-C4 and C4-C5. Multilevel foraminal stenosis, greatest bilaterally at C3-C4 (moderate/severe) and on the left at C4-C5 (moderate). 8. Vertebral body and left-sided facet ankylosis at C2-C3.  COGNITION: Overall cognitive status: Within functional limits for tasks assessed   SENSATION: Stocking numbness in BLEs   POSTURE: rounded shoulders and pt wearing cervical collar  LOWER EXTREMITY ROM:     Active  Right Eval Left Eval  Hip flexion    Hip extension    Hip abduction    Hip adduction    Hip internal rotation    Hip external rotation    Knee flexion    Knee extension    Ankle dorsiflexion    Ankle plantarflexion    Ankle inversion    Ankle eversion     (Blank rows = not tested)  LOWER EXTREMITY MMT:  Tested in seated position   MMT Right Eval Left Eval  Hip flexion 4- 4-  Hip extension    Hip abduction 5 5  Hip adduction 5 5  Hip internal rotation    Hip external rotation    Knee flexion 5 5  Knee extension 5 5  Ankle dorsiflexion 5 5  Ankle plantarflexion    Ankle  inversion    Ankle eversion    (Blank rows = not tested)  BED MOBILITY:  Pt reports he needs more support to get out of his bed at home due to having to get out on the L side (was getting out on the R side in the hospital) and has difficulty propping up on L shoulder due to pain  TRANSFERS: Assistive device utilized: Environmental consultant - 4 wheeled  Sit to stand: SBA Stand to sit: SBA Chair to chair: SBA Noted decreased anterior weight shift initially w/minor lateral shift to L side. Poor eccentric control w/fatigue   STAIRS: Level of Assistance: SBA Stair Negotiation Technique: Alternating Pattern  with Bilateral Rails Number of Stairs: 4  Height of Stairs: 6"  Comments: Decreased eccentric control on RLE   GAIT: Gait pattern: step through pattern, lateral hip instability, trunk flexed, and wide BOS Distance walked: Various clinic distances  Assistive device utilized: Environmental consultant - 4 wheeled and None Level of assistance: Modified independence and SBA Comments: Pt is mod I w/rollator and SBA w/no AD due to anterior instability and decreased visual scanning ability   VITALS: Assessed in RUE in seated and standing position  Vitals:   10/20/23 0956 10/20/23 0959  BP: 126/78 (!) 80/49  Pulse: 66 72  TREATMENT :  [employer] Act/Self-care/home management  Pt inquiring about using his Virl Diamond- it to play with his dog. Informed pt he is okay to use it at shoulder-level, but not overhead. Pt verbalized understanding.  Discussed HEP and pt reports the most difficult movement is supine shoulder flexion w/dowel. Assessed pt's movement in seated position w/wooden dowel and noted pt only able to achieve ~30 degrees of shoulder flexion on LUE prior to compensations (posterior lean, shoulder shrug, etc.).  Encouraged pt to alternate days working on supine and seated shoulder flexion. Pt verbalized understanding.   Pt reports he continues to experience hypersensitivity w/light touch. Encouraged pt to work on desensitization techniques at home when changing to assist w/this and pt verbalized understanding. Pt declined handout on this. Provided brief PNE regarding allodynia/hypersensitivity and pt able to teach back.  Assessed BP in seated position and standing (see above) in RUE while pt wearing abdominal binder, ted hose and ace wraps up to this thighs. Pt extremely orthostatic and symptomatic this date. Noted pt wears abdominal binder in cinched position at all times, even when seated, so discussed only cinching binder when standing from now on out and assess if this changes anything. Pt states he will work on this.    PATIENT EDUCATION: Education details: Updates to HEP, see above  Person educated: Patient Education method: Medical illustrator Education comprehension: verbalized understanding and needs further education  HOME EXERCISE PROGRAM: From [employer]; [id number]  GOALS: Goals reviewed with patient? Yes  SHORT TERM GOALS: Target date: 11/09/2023   Pt will be independent with initial HEP for improved strength, balance, transfers and gait.  Baseline: Goal status: INITIAL  2.  MCTSIB to be assessed and STG/LTG updated ;  hold condition 4 on mCTSIB with minimal to no postural sway for improved balance. (SD) Baseline: 30 sec hold with moderate postural sway Goal status: INITIAL  3.  to be assessed and LTG updated  Baseline: 1282.10' Goal status: Goal met 10-18-23  4.  Modified FGA to be assessed and LTG updated  Baseline:  Goal status: INITIAL  LONG TERM GOALS: Target date: 11/23/2023   Modified FGA goal  Baseline:  Goal status: INITIAL  2.  goal - increase distance to >1400' for increased endurance/gait efficiency. (SD) Baseline:  Goal status: INITIAL  3.  MCTSIB goal -  hold condition 4 on mCTSIB with minimal to no postural sway for improved balance.  (SD) Baseline:  Goal status: INITIAL  4.  Pt will return to walking dog on trail w/SBA and no instability for return to PLOF and improved independence  Baseline:  Goal status: INITIAL   ASSESSMENT:  CLINICAL IMPRESSION: Emphasis of skilled PT session on pt education, assessing orthostatics and revising HEP. Pt now wearing soft collar at all times and had several questions for therapist this date. PT okay w/pt using Chuck-It w/RUE at shoulder height to play with dog at home and added seated shoulder flexion to HEP to work on paraspinal and core stability. Pt continues to be limited by orthostatics and therapist questions effectiveness of abdominal binder as pt wearing it cinched at all times, so encouraged pt to only cinch binder when standing. Continue POC.    OBJECTIVE IMPAIRMENTS: Abnormal gait, decreased activity tolerance, decreased balance, decreased coordination, decreased endurance, decreased mobility, decreased strength, dizziness, impaired sensation, impaired UE functional use, improper body mechanics, and pain  ACTIVITY LIMITATIONS: carrying, lifting, bending, squatting, transfers, bed mobility, toileting, dressing, reach over head, hygiene/grooming, locomotion level, and caring  for others  PARTICIPATION LIMITATIONS: meal prep, cleaning, laundry, medication management, driving, shopping, community activity, occupation, and yard work  PERSONAL FACTORS: Fitness, Past/current experiences, and 1 comorbidity: quadriplegia  are also affecting patient's functional outcome.   REHAB POTENTIAL: Excellent  CLINICAL DECISION MAKING: Evolving/moderate complexity  EVALUATION COMPLEXITY: Moderate  PLAN:  PT FREQUENCY: 2x/week  PT DURATION: 6 weeks  PLANNED INTERVENTIONS: 97164- PT Re-evaluation, 97110-Therapeutic exercises, 97530- Therapeutic activity, 97112- Neuromuscular re-education, 97535- Self Care, 62130- Manual therapy, [phone]- Gait training, [phone]- Orthotic Fit/training, [phone]-  Aquatic Therapy, [phone]- Electrical stimulation (manual), Patient/Family education, Balance training, Stair training, Dry Needling, Joint mobilization, Spinal mobilization, Vestibular training, and DME instructions  PLAN FOR NEXT SESSION: Monitor vitals. modified FGA (no head turns) (not done on 4-7 due to time constraint) and update goals - Work on return to hiking.    Jill Alexanders Kolbi Tofte, PT, DPT 10/20/2023, 12:31 PM

## 2023-10-20 NOTE — Patient Instructions (Signed)
 Access Code: URL: [email or web address] Date: 10/20/2023 Prepared by: Amada Kingfisher  Exercises - Putty Squeezes  - 1-2 x daily - 10 reps - Rolling Putty on Table  - 1-2 x daily - 10 reps - Finger Pinch and Pull with Putty  - 1-2 x daily - 10 reps - 3-Point Pinch with Putty  - 1-2 x daily - 10 reps - Tip PUSH with Putty  - 1-2 x daily - 10 reps - Key Pinch with Putty  - 1-2 x daily - 10 reps - Finger Extension with Putty  - 1-2 x daily - 10 reps - Finger Adduction with Putty  - 1-2 x daily - 10 reps - Removing Marbles from Putty  - 1-2 x daily - 10 reps
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
## 2023-10-21 ENCOUNTER — Encounter: Payer: Self-pay | Admitting: Internal Medicine

## 2023-10-21 NOTE — Patient Instructions (Incomplete)
   Medications changes include :   fluoxetine 20 mg daily,  xanax 0.5 mg twice daily as needed for panic attack.  Buspar 5 mg at night as needed for sleep.
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
## 2023-10-22 ENCOUNTER — Ambulatory Visit: Admitting: Internal Medicine

## 2023-10-22 VITALS — BP 110/72 | HR 64 | Temp 98.0°F | Ht 68.0 in | Wt 154.0 lb

## 2023-10-22 DIAGNOSIS — J189 Pneumonia, unspecified organism: Secondary | ICD-10-CM

## 2023-10-22 DIAGNOSIS — S065XAA Traumatic subdural hemorrhage with loss of consciousness status unknown, initial encounter: Secondary | ICD-10-CM

## 2023-10-22 DIAGNOSIS — I10 Essential (primary) hypertension: Secondary | ICD-10-CM

## 2023-10-22 DIAGNOSIS — K5900 Constipation, unspecified: Secondary | ICD-10-CM | POA: Insufficient documentation

## 2023-10-22 DIAGNOSIS — G903 Multi-system degeneration of the autonomic nervous system: Secondary | ICD-10-CM | POA: Diagnosis not present

## 2023-10-22 DIAGNOSIS — G8252 Quadriplegia, C1-C4 incomplete: Secondary | ICD-10-CM | POA: Diagnosis not present

## 2023-10-22 DIAGNOSIS — I2699 Other pulmonary embolism without acute cor pulmonale: Secondary | ICD-10-CM

## 2023-10-22 DIAGNOSIS — N400 Enlarged prostate without lower urinary tract symptoms: Secondary | ICD-10-CM

## 2023-10-22 DIAGNOSIS — K5909 Other constipation: Secondary | ICD-10-CM | POA: Diagnosis not present

## 2023-10-22 DIAGNOSIS — S12301D Unspecified nondisplaced fracture of fourth cervical vertebra, subsequent encounter for fracture with routine healing: Secondary | ICD-10-CM | POA: Diagnosis not present

## 2023-10-22 DIAGNOSIS — I959 Hypotension, unspecified: Secondary | ICD-10-CM | POA: Insufficient documentation

## 2023-10-22 DIAGNOSIS — R310 Gross hematuria: Secondary | ICD-10-CM | POA: Diagnosis not present

## 2023-10-22 LAB — CBC WITH DIFFERENTIAL/PLATELET
Basophils Absolute: 0 10*3/uL (ref 0.0–0.1)
Basophils Relative: 0.2 % (ref 0.0–3.0)
Eosinophils Absolute: 0.3 10*3/uL (ref 0.0–0.7)
Eosinophils Relative: 3.8 % (ref 0.0–5.0)
HCT: 40.5 % (ref 39.0–52.0)
Hemoglobin: 14 g/dL (ref 13.0–17.0)
Lymphocytes Relative: 28.1 % (ref 12.0–46.0)
Lymphs Abs: 2.2 10*3/uL (ref 0.7–4.0)
MCHC: 34.5 g/dL (ref 30.0–36.0)
MCV: 97.1 fl (ref 78.0–100.0)
Monocytes Absolute: 0.7 10*3/uL (ref 0.1–1.0)
Monocytes Relative: 9.6 % (ref 3.0–12.0)
Neutro Abs: 4.5 10*3/uL (ref 1.4–7.7)
Neutrophils Relative %: 58.3 % (ref 43.0–77.0)
Platelets: 185 10*3/uL (ref 150.0–400.0)
RBC: 4.17 Mil/uL — ABNORMAL LOW (ref 4.22–5.81)
RDW: 13.2 % (ref 11.5–15.5)
WBC: 7.8 10*3/uL (ref 4.0–10.5)

## 2023-10-22 LAB — COMPREHENSIVE METABOLIC PANEL WITH GFR
ALT: 15 U/L (ref 0–53)
AST: 18 U/L (ref 0–37)
Albumin: 4.4 g/dL (ref 3.5–5.2)
Alkaline Phosphatase: 63 U/L (ref 39–117)
BUN: 19 mg/dL (ref 6–23)
CO2: 32 meq/L (ref 19–32)
Calcium: 10.9 mg/dL — ABNORMAL HIGH (ref 8.4–10.5)
Chloride: 103 meq/L (ref 96–112)
Creatinine, Ser: 0.91 mg/dL (ref 0.40–1.50)
GFR: 81.84 mL/min (ref >60.00)
Glucose, Bld: 86 mg/dL (ref 70–99)
Potassium: 4.3 meq/L (ref 3.5–5.1)
Sodium: 141 meq/L (ref 135–145)
Total Bilirubin: 0.8 mg/dL (ref 0.2–1.2)
Total Protein: 6.7 g/dL (ref 6.0–8.3)
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
## 2023-10-22 NOTE — Assessment & Plan Note (Signed)
 Subacute Taken off blood pressure medications BP dropping 20-40 points going from sitting to standing On midodrine 10 mg 3 times daily which is helping Also with abdominal compression and bilateral leg compression He is very careful when changing positions He is keeping up with his fluids He will continue to monitor his BP at home
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
## 2023-10-22 NOTE — Assessment & Plan Note (Signed)
 While in the hospital possible left lower lobe pneumonia although could have been atelectasis Completed 7 days of cefepime No concerning respiratory symptoms now
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
## 2023-10-22 NOTE — Assessment & Plan Note (Signed)
 PE while hospitalized-left-sided PE with no acute right heart strain Initially started on Lovenox and then transition to Eliquis Continue Eliquis 5 mg twice daily
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
## 2023-10-23 ENCOUNTER — Encounter: Payer: Self-pay | Admitting: Internal Medicine

## 2023-10-25 ENCOUNTER — Ambulatory Visit: Admitting: Physical Therapy

## 2023-10-25 ENCOUNTER — Ambulatory Visit: Admitting: Occupational Therapy

## 2023-10-25 ENCOUNTER — Encounter: Payer: Self-pay | Admitting: Physical Medicine and Rehabilitation

## 2023-10-25 VITALS — BP 79/38 | HR 74

## 2023-10-25 DIAGNOSIS — R278 Other lack of coordination: Secondary | ICD-10-CM | POA: Diagnosis not present

## 2023-10-25 DIAGNOSIS — R2681 Unsteadiness on feet: Secondary | ICD-10-CM | POA: Diagnosis not present

## 2023-10-25 DIAGNOSIS — R29818 Other symptoms and signs involving the nervous system: Secondary | ICD-10-CM | POA: Diagnosis not present

## 2023-10-25 DIAGNOSIS — M25512 Pain in left shoulder: Secondary | ICD-10-CM

## 2023-10-25 DIAGNOSIS — R29898 Other symptoms and signs involving the musculoskeletal system: Secondary | ICD-10-CM | POA: Diagnosis not present

## 2023-10-25 DIAGNOSIS — G8252 Quadriplegia, C1-C4 incomplete: Secondary | ICD-10-CM | POA: Diagnosis not present

## 2023-10-25 DIAGNOSIS — M6281 Muscle weakness (generalized): Secondary | ICD-10-CM | POA: Diagnosis not present

## 2023-10-25 DIAGNOSIS — R208 Other disturbances of skin sensation: Secondary | ICD-10-CM

## 2023-10-25 DIAGNOSIS — R2689 Other abnormalities of gait and mobility: Secondary | ICD-10-CM | POA: Diagnosis not present

## 2023-10-25 NOTE — Patient Instructions (Addendum)
 Brandon Watts

## 2023-10-25 NOTE — Therapy (Signed)
 OUTPATIENT PHYSICAL THERAPY NEURO TREATMENT   Patient Name: FAHIM KATS MRN: [id number] DOB:08-01-1945, 78 y.o., male Today's Date: 10/25/2023   PCP: Pincus Sanes, MD REFERRING PROVIDER: Charlton Amor, PA-C  END OF SESSION:  PT End of Session - 10/25/23 0848     Visit Number 4    Number of Visits 13    Date for PT Re-Evaluation 12/07/23    Authorization Type [employer] Medicare    PT Start Time 0847    PT Stop Time 0910   Limited due to orthostatic hypotension   PT Time Calculation (min) 23 min    Equipment Utilized During Treatment Cervical collar;Other (comment)   Abdominal binder, Ted hose w/ace wraps   Activity Tolerance Treatment limited secondary to medical complications (Comment)   Hypotension   Behavior During Therapy WFL for tasks assessed/performed                Past Medical History:  Diagnosis Date   Allergic rhinitis    Arthritis    PAIN AND OA RIGHT HIP   Arthritis    BPH (benign prostatic hyperplasia)    DDD (degenerative disc disease), lumbar    MILD-FOUND ON XRAY   Glaucoma    normotensive type with early changes   Glaucoma    Hepatitis    HEPATITIS C -CLEARED   Hepatitis    Hep C, treated and cleared   HLD (hyperlipidemia)    taking statin as preventative   Hypertension    Insomnia    CHRONIC   Insomnia    Positive PPD, treated    Pulmonary nodules    FOLLOWED BY DR. Marvis Repress. GATES WITH CT EXAMS-MOST RECENT 01/22/12   Status post trigger finger release    right   Past Surgical History:  Procedure Laterality Date   ANTERIOR CERVICAL DECOMP/DISCECTOMY FUSION N/A 09/09/2023   Procedure: ANTERIOR CERVICAL DECOMPRESSION/DISCECTOMY FUSION 2 LEVEL/HARDWARE REMOVAL - C 3-4 , C4-5;  Surgeon: Bedelia Person, MD;  Location: [facility] OR;  Service: Neurosurgery;  Laterality: N/A;   EYE SURGERY     LASIK EYE SURGERY   LITHOTRIPSY     x 3   LITHOTRIPSY     x2   TONSILLECTOMY     AS A CHILD   TONSILLECTOMY     TOTAL HIP ARTHROPLASTY Right  10/25/2012   Procedure: RIGHT TOTAL HIP ARTHROPLASTY ANTERIOR APPROACH;  Surgeon: Shelda Pal, MD;  Location: [facility] ORS;  Service: Orthopedics;  Laterality: Right;   TOTAL HIP ARTHROPLASTY Right    TRIGGER FINGER RELEASE Right 2016   third digit right hand. Dr.Supple   URETERAL STONE EXTRACTION S X 2     VASCECTOMY     Patient Active Problem List   Diagnosis Date Noted   Hypotension 10/22/2023   Gross hematuria 10/22/2023   Constipation 10/22/2023   Quadriplegia, C1-C4, incomplete (HCC) 09/18/2023   C4 cervical fracture (HCC) 09/17/2023   Central cord syndrome (HCC) 09/17/2023   Dysphagia 09/17/2023   Respiratory failure with hypoxia (HCC) 09/17/2023   SDH (subdural hematoma) (HCC) 09/17/2023   Malnutrition of moderate degree 09/15/2023   Acute pulmonary embolism without acute cor pulmonale (HCC) 09/15/2023   Post-nasal drainage 09/15/2023   Pneumonia of left lower lobe due to infectious organism 09/15/2023   Hyperlipidemia 09/08/2023   Diastolic dysfunction without heart failure 09/17/2022   Episodic lightheadedness 08/26/2022   Vasovagal near syncope 08/26/2022   Hypercalcemia 08/25/2022   Hyperglycemia 08/25/2022   Acute left-sided low back pain with  left-sided sciatica 10/13/2021   Left renal mass 10/12/2021   Performance anxiety 08/17/2021   Glaucoma 08/14/2021   Insomnia 08/14/2021   Hyperlipoproteinemia 08/14/2021   BPH (benign prostatic hyperplasia) 08/14/2021   Erectile dysfunction 08/14/2021   Trigger finger, acquired 07/11/2013   S/P right THA, AA 10/25/2012   BACK PAIN, LUMBAR 01/22/2010   NONSPEC REACT TUBERCULIN SKN TEST W/O ACTV TB 06/13/2009   HEPATITIS C 02/12/2007   ALLERGIC RHINITIS 02/12/2007   History of colonic polyps 02/12/2007    ONSET DATE: 10/08/2023 (referral)   REFERRING DIAG: G82.52 (ICD-10-CM) - Quadriplegia, C1-C4 incomplete S12.300A (ICD-10-CM) - Unspecified displaced fracture of fourth cervical vertebra, initial encounter for closed  fracture S14.129A (ICD-10-CM) - Central cord syndrome at unspecified level of cervical spinal cord, initial encounter  THERAPY DIAG:  Muscle weakness (generalized)  Other lack of coordination  Quadriplegia, C1-C4, incomplete (HCC)  Unsteadiness on feet  Rationale for Evaluation and Treatment: Rehabilitation  SUBJECTIVE:                                                                                                                                                                                             SUBJECTIVE STATEMENT:  Pt presents w/soft collar. Reports being very fatigued today, slept all day yesterday on the couch and had no energy. HEP is wearing him out, was only able to perform 1x yesterday compared to his regular 2x.   Pt accompanied by:  Wife, Olegario Messier   PERTINENT HISTORY: HTN, HLD, BPH, Glaucoma, Hx of hep C s/p treatment  PAIN:  Are you having pain? Yes: NPRS scale: 2-3/10; with movement 4/10 LUE  Pain location: Bilateral shoulder girdles L > R  Pain description: Achy  Aggravating factors: Moving arm Relieving factors: Pain meds  PRECAUTIONS: Cervical, Fall, and Other: No lifting over 5#  RED FLAGS: None   WEIGHT BEARING RESTRICTIONS: No  FALLS: Has patient fallen in last 6 months? No  LIVING ENVIRONMENT: Lives with: lives with their spouse Lives in: House/apartment Stairs: Yes: Internal: 16 steps; on right going up, on left going up, and can reach both and External: 3 + 1 steps; on right going up, on left going up, and can reach both Has following equipment at home: Dan Humphreys - 4 wheeled, Shower bench, and bed side commode  PLOF: Independent  PATIENT GOALS: "To be able to hike in the [place] in August and in [place] in 07/2024. I would like to be able to play the cello again and walk my dog along the trails in about a month"   OBJECTIVE:  Note: Objective measures were completed at Evaluation unless otherwise noted.  DIAGNOSTIC FINDINGS: MRI of  C-spine from 09/08/2023  IMPRESSION: 1. Signal abnormality within the ventral spinal cord at the C3-C4 level likely reflecting cord contusion. 2. Edema at site of a known acute fracture within the C4 spinous process. 3. Ligamentum flavum irregularity at C4-C5 consistent with ligamentum flavum injury, and possible ligamentum flavum disruption. 4. Edema signal within the interspinous spaces at C3-C4, C4-C5, C5-C6, C6-C7 and C7-T1 consistent with interspinous ligament injury. 5. Thinned appearance of the anterior longitudinal ligament at the C3 level suspicious for anterior longitudinal ligament injury (without complete ligamentous disruption). 6. Prevertebral edema/hematoma spanning the C2-C6 levels. 7. Cervical spondylosis as outlined within the body of the report. Moderate spinal canal stenosis at C3-C4 and C4-C5. Multilevel foraminal stenosis, greatest bilaterally at C3-C4 (moderate/severe) and on the left at C4-C5 (moderate). 8. Vertebral body and left-sided facet ankylosis at C2-C3.  COGNITION: Overall cognitive status: Within functional limits for tasks assessed   SENSATION: Stocking numbness in BLEs   POSTURE: rounded shoulders and pt wearing cervical collar  LOWER EXTREMITY ROM:     Active  Right Eval Left Eval  Hip flexion    Hip extension    Hip abduction    Hip adduction    Hip internal rotation    Hip external rotation    Knee flexion    Knee extension    Ankle dorsiflexion    Ankle plantarflexion    Ankle inversion    Ankle eversion     (Blank rows = not tested)  LOWER EXTREMITY MMT:  Tested in seated position   MMT Right Eval Left Eval  Hip flexion 4- 4-  Hip extension    Hip abduction 5 5  Hip adduction 5 5  Hip internal rotation    Hip external rotation    Knee flexion 5 5  Knee extension 5 5  Ankle dorsiflexion 5 5  Ankle plantarflexion    Ankle inversion    Ankle eversion    (Blank rows = not tested)  BED MOBILITY:  Pt reports he  needs more support to get out of his bed at home due to having to get out on the L side (was getting out on the R side in the hospital) and has difficulty propping up on L shoulder due to pain  TRANSFERS: Assistive device utilized: Environmental consultant - 4 wheeled  Sit to stand: SBA Stand to sit: SBA Chair to chair: SBA Noted decreased anterior weight shift initially w/minor lateral shift to L side. Poor eccentric control w/fatigue   STAIRS: Level of Assistance: SBA Stair Negotiation Technique: Alternating Pattern  with Bilateral Rails Number of Stairs: 4  Height of Stairs: 6"  Comments: Decreased eccentric control on RLE   GAIT: Gait pattern: step through pattern, lateral hip instability, trunk flexed, and wide BOS Distance walked: Various clinic distances  Assistive device utilized: Environmental consultant - 4 wheeled and None Level of assistance: Modified independence and SBA Comments: Pt is mod I w/rollator and SBA w/no AD due to anterior instability and decreased visual scanning ability   VITALS: Assessed in RUE in seated and standing position  Vitals:   10/25/23 0856 10/25/23 0857  BP: 94/75 (!) 79/38  Pulse: 70 74  TREATMENT :  Self-care/home management  Discussed potential to back off on the HEP ( perform 1x/day instead of 2x), but pt refusing and stated he was "fine". Encouraged pt to back off on days he is extremely fatigued, such as yesterday, and continue to closely monitor his BP.  Informed pt he needs to use his rollator at all times due to severe low BP. Pt verbalized understanding.  Assessed vitals in RUE in seated and standing position (see above) and BP hypotensive and pt symptomatic. Assessed BP in standing after 1 minute instead of 2 as pt very lightheaded and demonstrating moderate postural sway. Pt in agreement for therapist to contact Dr. Raynaldo Call and pt to contact her as well to inquire about BP  management.  Educated pt on importance of hydration and trying some electrolytes to see if this helps boost BP.    PATIENT EDUCATION: Education details: See above, BP management, contacting Dr. Raynaldo Call   Person educated: Patient Education method: Explanation and Demonstration Education comprehension: verbalized understanding and needs further education  HOME EXERCISE PROGRAM: From CIRDeronda Flesher; [id number]  GOALS: Goals reviewed with patient? Yes  SHORT TERM GOALS: Target date: 11/09/2023   Pt will be independent with initial HEP for improved strength, balance, transfers and gait.  Baseline: Goal status: INITIAL  2.  MCTSIB to be assessed and STG/LTG updated ;  hold condition 4 on mCTSIB with minimal to no postural sway for improved balance. (SD) Baseline: 30 sec hold with moderate postural sway Goal status: INITIAL  3.  to be assessed and LTG updated  Baseline: 1282.10' Goal status: Goal met 10-18-23  4.  Modified FGA to be assessed and LTG updated  Baseline:  Goal status: INITIAL  LONG TERM GOALS: Target date: 11/23/2023   Modified FGA goal  Baseline:  Goal status: INITIAL  2.  goal - increase distance to >1400' for increased endurance/gait efficiency. (SD) Baseline:  Goal status: INITIAL  3.  MCTSIB goal -  hold condition 4 on mCTSIB with minimal to no postural sway for improved balance. (SD) Baseline:  Goal status: INITIAL  4.  Pt will return to walking dog on trail w/SBA and no instability for return to PLOF and improved independence  Baseline:  Goal status: INITIAL   ASSESSMENT:  CLINICAL IMPRESSION: Session limited as pt very hypotensive and symptomatic today. Pt reporting increased levels of fatigue and states he was on the couch all day yesterday due to fatigue. Pt wearing abdominal binder, ted hose and ace wraps up to his thighs and continues to be hypotensive. Encouraged pt to contact DR. Lovorn and therapist will message her as well to make  her aware and inquire about BP management. Continue POC.    OBJECTIVE IMPAIRMENTS: Abnormal gait, decreased activity tolerance, decreased balance, decreased coordination, decreased endurance, decreased mobility, decreased strength, dizziness, impaired sensation, impaired UE functional use, improper body mechanics, and pain  ACTIVITY LIMITATIONS: carrying, lifting, bending, squatting, transfers, bed mobility, toileting, dressing, reach over head, hygiene/grooming, locomotion level, and caring for others  PARTICIPATION LIMITATIONS: meal prep, cleaning, laundry, medication management, driving, shopping, community activity, occupation, and yard work  PERSONAL FACTORS: Fitness, Past/current experiences, and 1 comorbidity: quadriplegia  are also affecting patient's functional outcome.   REHAB POTENTIAL: Excellent  CLINICAL DECISION MAKING: Evolving/moderate complexity  EVALUATION COMPLEXITY: Moderate  PLAN:  PT FREQUENCY: 2x/week  PT DURATION: 6 weeks  PLANNED INTERVENTIONS: 97164- PT Re-evaluation, 97110-Therapeutic exercises, 97530- Therapeutic activity, [id number]- Neuromuscular re-education, 97535- Self Care, 21308- Manual therapy, [id number]- Gait  training, 84132- Orthotic Fit/training, 44010- Aquatic Therapy, [phone]- Electrical stimulation (manual), Patient/Family education, Balance training, Stair training, Dry Needling, Joint mobilization, Spinal mobilization, Vestibular training, and DME instructions  PLAN FOR NEXT SESSION: Monitor vitals- pt very hypotensive. modified FGA (no head turns) (not done on 4-7 due to time constraint) and update goals - Work on return to hiking.    Jnya Brossard E Netasha Wehrli, PT, DPT 10/25/2023, 9:13 AM
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
## 2023-10-26 NOTE — Telephone Encounter (Signed)
 Called and discussed symptoms with patient over the phone.  He has not had any falls and experiences some intermittent minor dizziness, has been taking his own blood pressure at home and notes fairly consistent orthostatic hypotension with baseline blood pressures 110s to 130s / 70s to 80s, and consistent SBP drop of about 20 points and DBP drops to as low as 45 with standing.  He endorses he has been good with fluid intakes, compliant with TED hose, no systemic signs of illness and has been increasing home activity.  Felt especially tired/early fatigue after 1/2 mile walk over the weekend.  Per PT notes, has been doing home exercise program twice daily, has not been especially receptive to reducing the frequency of this.  Reviewed patient's medications.  He remains on MS Contin 15 mg twice daily, gabapentin 400 mg 3 times daily, and Tylenol 1000 mg 3 times daily as needed for pain control.  He continues to take Seroquel 25 mg at night; will also take baclofen as needed at bedtime but does not use it other times of the day.  Has not needed tramadol at home.  Currently, pain is mostly relegated to the left greater than right shoulder girdles and cervical paraspinal muscles.  He does continue to have paresthesias in his left greater than right hand and arm, through his perineum and down into his legs.  He is recently noticed decreased sensation to hot water when in the bath.  However, overall is progressing in therapies, has not noticed any discrete neurologic changes since discharge.  Plan: - Continue midodrine 10 mg 3 times daily, drinking plenty of p.o. fluids, compliance with TED hose.  Recommended monitoring to slightly increasing salt intake to keep intravascular volume.  -Advised patient of activities that may worsen hypotension, such as following strenuous exercise and vasodilation in the warm bath.   - Agree with PT in need to reduce amount of strenuous activity, due to overexertion causing  exhaustion the following day.  - Reduced gabapentin from 400 mg 3 times daily to 400 mg twice daily, then if tolerated, 400 mg nightly.  Offered to lower standing dose, but patient wishes to continue with his current capsules.  -Continue monitoring blood pressure at home.  - Patient will reach out to me through MyChart towards the end of this week to report effects.   Bea Lime, DO 10/26/2023

## 2023-10-28 ENCOUNTER — Ambulatory Visit

## 2023-10-28 ENCOUNTER — Ambulatory Visit: Admitting: Occupational Therapy

## 2023-10-28 VITALS — BP 104/67 | HR 70

## 2023-10-28 VITALS — BP 123/63

## 2023-10-28 DIAGNOSIS — R278 Other lack of coordination: Secondary | ICD-10-CM

## 2023-10-28 DIAGNOSIS — R2689 Other abnormalities of gait and mobility: Secondary | ICD-10-CM | POA: Diagnosis not present

## 2023-10-28 DIAGNOSIS — M25512 Pain in left shoulder: Secondary | ICD-10-CM | POA: Diagnosis not present

## 2023-10-28 DIAGNOSIS — R208 Other disturbances of skin sensation: Secondary | ICD-10-CM

## 2023-10-28 DIAGNOSIS — R2681 Unsteadiness on feet: Secondary | ICD-10-CM

## 2023-10-28 DIAGNOSIS — G8252 Quadriplegia, C1-C4 incomplete: Secondary | ICD-10-CM | POA: Diagnosis not present

## 2023-10-28 DIAGNOSIS — M6281 Muscle weakness (generalized): Secondary | ICD-10-CM | POA: Diagnosis not present

## 2023-10-28 DIAGNOSIS — R339 Retention of urine, unspecified: Secondary | ICD-10-CM | POA: Diagnosis not present

## 2023-10-28 DIAGNOSIS — R29818 Other symptoms and signs involving the nervous system: Secondary | ICD-10-CM

## 2023-10-28 DIAGNOSIS — R29898 Other symptoms and signs involving the musculoskeletal system: Secondary | ICD-10-CM | POA: Diagnosis not present

## 2023-10-28 DIAGNOSIS — N319 Neuromuscular dysfunction of bladder, unspecified: Secondary | ICD-10-CM | POA: Diagnosis not present

## 2023-10-28 DIAGNOSIS — S14101A Unspecified injury at C1 level of cervical spinal cord, initial encounter: Secondary | ICD-10-CM | POA: Diagnosis not present

## 2023-10-28 NOTE — Therapy (Signed)
 OUTPATIENT PHYSICAL THERAPY NEURO TREATMENT   Patient Name: Brandon Watts MRN: [id number] DOB:[birth year], 78 y.o., male Today's Date: 10/28/2023   PCP: Pincus Sanes, MD REFERRING PROVIDER: Charlton Amor, PA-C  END OF SESSION:  PT End of Session - 10/28/23 1100     Visit Number 5    Number of Visits 13    Date for PT Re-Evaluation 12/07/23    Authorization Type [employer] Medicare    PT Start Time 1103   patient received from OT   PT Stop Time 1141    PT Time Calculation (min) 38 min    Equipment Utilized During Treatment Cervical collar;Other (comment);Gait belt   TED hose, ACE wraps, abd binder   Activity Tolerance Patient tolerated treatment well    Behavior During Therapy WFL for tasks assessed/performed                Past Medical History:  Diagnosis Date   Allergic rhinitis    Arthritis    PAIN AND OA RIGHT HIP   Arthritis    BPH (benign prostatic hyperplasia)    DDD (degenerative disc disease), lumbar    MILD-FOUND ON XRAY   Glaucoma    normotensive type with early changes   Glaucoma    Hepatitis    HEPATITIS C -CLEARED   Hepatitis    Hep C, treated and cleared   HLD (hyperlipidemia)    taking statin as preventative   Hypertension    Insomnia    CHRONIC   Insomnia    Positive PPD, treated    Pulmonary nodules    FOLLOWED BY DR. Marvis Repress GATES WITH CT EXAMS-MOST RECENT 01/22/12   Status post trigger finger release    right   Past Surgical History:  Procedure Laterality Date   ANTERIOR CERVICAL DECOMP/DISCECTOMY FUSION N/A 09/09/2023   Procedure: ANTERIOR CERVICAL DECOMPRESSION/DISCECTOMY FUSION 2 LEVEL/HARDWARE REMOVAL - C 3-4 , C4-5;  Surgeon: Bedelia Person, MD;  Location: [facility] OR;  Service: Neurosurgery;  Laterality: N/A;   EYE SURGERY     LASIK EYE SURGERY   LITHOTRIPSY     x 3   LITHOTRIPSY     x2   TONSILLECTOMY     AS A CHILD   TONSILLECTOMY     TOTAL HIP ARTHROPLASTY Right 10/25/2012   Procedure: RIGHT TOTAL HIP ARTHROPLASTY  ANTERIOR APPROACH;  Surgeon: Shelda Pal, MD;  Location: [facility];  Service: Orthopedics;  Laterality: Right;   TOTAL HIP ARTHROPLASTY Right    TRIGGER FINGER RELEASE Right 2016   third digit right hand. Dr.Supple   URETERAL STONE EXTRACTION S X 2     VASCECTOMY     Patient Active Problem List   Diagnosis Date Noted   Hypotension 10/22/2023   Gross hematuria 10/22/2023   Constipation 10/22/2023   Quadriplegia, C1-C4, incomplete (HCC) 09/18/2023   C4 cervical fracture (HCC) 09/17/2023   Central cord syndrome (HCC) 09/17/2023   Dysphagia 09/17/2023   Respiratory failure with hypoxia (HCC) 09/17/2023   SDH (subdural hematoma) (HCC) 09/17/2023   Malnutrition of moderate degree 09/15/2023   Acute pulmonary embolism without acute cor pulmonale (HCC) 09/15/2023   Post-nasal drainage 09/15/2023   Pneumonia of left lower lobe due to infectious organism 09/15/2023   Hyperlipidemia 09/08/2023   Diastolic dysfunction without heart failure 09/17/2022   Episodic lightheadedness 08/26/2022   Vasovagal near syncope 08/26/2022   Hypercalcemia 08/25/2022   Hyperglycemia 08/25/2022   Acute left-sided low back pain with left-sided sciatica 10/13/2021  Left renal mass 10/12/2021   Performance anxiety 08/17/2021   Glaucoma 08/14/2021   Insomnia 08/14/2021   Hyperlipoproteinemia 08/14/2021   BPH (benign prostatic hyperplasia) 08/14/2021   Erectile dysfunction 08/14/2021   Trigger finger, acquired 07/11/2013   S/P right THA, AA 10/25/2012   BACK PAIN, LUMBAR 01/22/2010   NONSPEC REACT TUBERCULIN SKN TEST W/O ACTV TB 06/13/2009   HEPATITIS C 02/12/2007   ALLERGIC RHINITIS 02/12/2007   History of colonic polyps 02/12/2007    ONSET DATE: 10/08/2023 (referral)   REFERRING DIAG: G82.52 (ICD-10-CM) - Quadriplegia, C1-C4 incomplete S12.300A (ICD-10-CM) - Unspecified displaced fracture of fourth cervical vertebra, initial encounter for closed fracture S14.129A (ICD-10-CM) - Central cord syndrome at  unspecified level of cervical spinal cord, initial encounter  THERAPY DIAG:  Muscle weakness (generalized)  Other lack of coordination  Unsteadiness on feet  Other abnormalities of gait and mobility  Rationale for Evaluation and Treatment: Rehabilitation  SUBJECTIVE:                                                                                                                                                                                             SUBJECTIVE STATEMENT:  Patient received from OT. Does endorse increased pain in B shoulders and periscapular muscles. Denies falls. Did have very low energy yesterday. Reports that BP has been dropping less at home   Pt accompanied by:  Wife, Thersia Flax   PERTINENT HISTORY: HTN, HLD, BPH, Glaucoma, Hx of hep C s/p treatment  PAIN:  Are you having pain? Yes: NPRS scale: 2-3/10; with movement 4/10 LUE  Pain location: Bilateral shoulder girdles L > R  Pain description: Achy  Aggravating factors: Moving arm Relieving factors: Pain meds  PRECAUTIONS: Cervical, Fall, and Other: No lifting over 5#, foley catheter  RED FLAGS: None   WEIGHT BEARING RESTRICTIONS: No  FALLS: Has patient fallen in last 6 months? No  LIVING ENVIRONMENT: Lives with: lives with their spouse Lives in: House/apartment Stairs: Yes: Internal: 16 steps; on right going up, on left going up, and can reach both and External: 3 + 1 steps; on right going up, on left going up, and can reach both Has following equipment at home: Otho Blitz - 4 wheeled, Shower bench, and bed side commode  PLOF: Independent  PATIENT GOALS: "To be able to hike in the [place] in August and in [place] in 07/2024. I would like to be able to play the cello again and walk my dog along the trails in about a month"   OBJECTIVE:  Note: Objective measures were completed at Evaluation unless otherwise noted.  DIAGNOSTIC FINDINGS: MRI of C-spine from 09/08/2023  IMPRESSION: 1. Signal  abnormality within the ventral spinal cord at the C3-C4 level likely reflecting cord contusion. 2. Edema at site of a known acute fracture within the C4 spinous process. 3. Ligamentum flavum irregularity at C4-C5 consistent with ligamentum flavum injury, and possible ligamentum flavum disruption. 4. Edema signal within the interspinous spaces at C3-C4, C4-C5, C5-C6, C6-C7 and C7-T1 consistent with interspinous ligament injury. 5. Thinned appearance of the anterior longitudinal ligament at the C3 level suspicious for anterior longitudinal ligament injury (without complete ligamentous disruption). 6. Prevertebral edema/hematoma spanning the C2-C6 levels. 7. Cervical spondylosis as outlined within the body of the report. Moderate spinal canal stenosis at C3-C4 and C4-C5. Multilevel foraminal stenosis, greatest bilaterally at C3-C4 (moderate/severe) and on the left at C4-C5 (moderate). 8. Vertebral body and left-sided facet ankylosis at C2-C3.  COGNITION: Overall cognitive status: Within functional limits for tasks assessed   SENSATION: Stocking numbness in BLEs   POSTURE: rounded shoulders and pt wearing cervical collar  LOWER EXTREMITY ROM:     Active  Right Eval Left Eval  Hip flexion    Hip extension    Hip abduction    Hip adduction    Hip internal rotation    Hip external rotation    Knee flexion    Knee extension    Ankle dorsiflexion    Ankle plantarflexion    Ankle inversion    Ankle eversion     (Blank rows = not tested)  LOWER EXTREMITY MMT:  Tested in seated position   MMT Right Eval Left Eval  Hip flexion 4- 4-  Hip extension    Hip abduction 5 5  Hip adduction 5 5  Hip internal rotation    Hip external rotation    Knee flexion 5 5  Knee extension 5 5  Ankle dorsiflexion 5 5  Ankle plantarflexion    Ankle inversion    Ankle eversion    (Blank rows = not tested)  BED MOBILITY:  Pt reports he needs more support to get out of his bed at home due  to having to get out on the L side (was getting out on the R side in the hospital) and has difficulty propping up on L shoulder due to pain  TRANSFERS: Assistive device utilized: Environmental consultant - 4 wheeled  Sit to stand: SBA Stand to sit: SBA Chair to chair: SBA Noted decreased anterior weight shift initially w/minor lateral shift to L side. Poor eccentric control w/fatigue   STAIRS: Level of Assistance: SBA Stair Negotiation Technique: Alternating Pattern  with Bilateral Rails Number of Stairs: 4  Height of Stairs: 6"  Comments: Decreased eccentric control on RLE   GAIT: Gait pattern: step through pattern, lateral hip instability, trunk flexed, and wide BOS Distance walked: Various clinic distances  Assistive device utilized: Environmental consultant - 4 wheeled and None Level of assistance: Modified independence and SBA Comments: Pt is mod I w/rollator and SBA w/no AD due to anterior instability and decreased visual scanning ability   VITALS: Assessed in RUE in seated and standing position  Vitals:   10/28/23 1139 10/28/23 1140  BP: (!) 140/77 104/67  Pulse:  TREATMENT :  Theract: Vitals:   10/28/23 1139 10/28/23 1140  BP: (!) 140/77 104/67  Pulse:    -scifit hills level 3 x 10 mins to support BP   [facility] PT Assessment - 10/28/23 0001       Functional Gait  Assessment   Gait assessed  Yes    Gait Level Surface Walks 20 ft in less than 5.5 sec, no assistive devices, good speed, no evidence for imbalance, normal gait pattern, deviates no more than 6 in outside of the 12 in walkway width.    Change in Gait Speed Able to smoothly change walking speed without loss of balance or gait deviation. Deviate no more than 6 in outside of the 12 in walkway width.    Gait with Horizontal Head Turns Performs head turns with moderate changes in gait velocity, slows down, deviates 10-15 in outside 12 in walkway width  but recovers, can continue to walk.   unable d/t c-spine precautions   Gait with Vertical Head Turns Performs task with severe disruption of gait (eg, staggers 15 in outside 12 in walkway width, loses balance, stops, reaches for wall).   unable d/t c-spine precautions   Gait and Pivot Turn Pivot turns safely within 3 sec and stops quickly with no loss of balance.    Step Over Obstacle Is able to step over 2 stacked shoe boxes taped together (9 in total height) without changing gait speed. No evidence of imbalance.    Gait with Narrow Base of Support Ambulates 7-9 steps.    Gait with Eyes Closed Walks 20 ft, uses assistive device, slower speed, mild gait deviations, deviates 6-10 in outside 12 in walkway width. Ambulates 20 ft in less than 9 sec but greater than 7 sec.    Ambulating Backwards Walks 20 ft, no assistive devices, good speed, no evidence for imbalance, normal gait    Steps Alternating feet, no rail.    Total Score 22           -for LE endurance and return to hiking:   -anterior step up 8" box no UE 2x30s (2/10 RPE)   -lateral step up 8" box B LE 1x30s each (2/10 RPE)     PATIENT EDUCATION: Education details: exam findings, continue HEP Person educated: Patient Education method: Explanation and Demonstration Education comprehension: verbalized understanding and needs further education  HOME EXERCISE PROGRAM: From [facility]; [id number]  GOALS: Goals reviewed with patient? Yes  SHORT TERM GOALS: Target date: 11/09/2023   Pt will be independent with initial HEP for improved strength, balance, transfers and gait.  Baseline: Goal status: INITIAL  2.  MCTSIB to be assessed and STG/LTG updated ;  hold condition 4 on mCTSIB with minimal to no postural sway for improved balance. (SD) Baseline: 30 sec hold with moderate postural sway Goal status: INITIAL  3.  to be assessed and LTG updated  Baseline: 1282.10' Goal status: Goal met 10-18-23  4.  Modified FGA to be  assessed and LTG updated  Baseline: 22/30 Goal status: MET  LONG TERM GOALS: Target date: 11/23/2023   Patient will score >/=25/30 (accounting for not being able to complete head turns) on FGA to demonstrate improved walking balance Baseline: 22/30 Goal status: REVISED  2.  goal - increase distance to >1400' for increased endurance/gait efficiency. (SD) Baseline:  Goal status: INITIAL  3.  MCTSIB goal -  hold condition 4 on mCTSIB with minimal to no postural sway for improved balance. (SD) Baseline:  Goal status: INITIAL  4.  Pt will return to walking dog on trail w/SBA and no instability for return to PLOF and improved independence  Baseline:  Goal status: INITIAL   ASSESSMENT:  CLINICAL IMPRESSION: Patient seen for skilled PT session with emphasis on general endurance training and outcome measure assessment. BP much more appropriate today, though still orthostatic at times. Patient demonstrates increased fall risk as noted by score of 22/30 on  Functional Gait Assessment.   <22/30 = predictive of falls, <20/30 = fall in 6 months, <18/30 = predictive of falls in PD MCID: 5 points stroke population, 4 points geriatric population (ANPTA Core Set of Outcome Measures for Adults with Neurologic Conditions, 2018). Limited endurance R LE>L LE noted; however, poor eccentric control with L LE noted- increasing with fatigue. Continue POC.     OBJECTIVE IMPAIRMENTS: Abnormal gait, decreased activity tolerance, decreased balance, decreased coordination, decreased endurance, decreased mobility, decreased strength, dizziness, impaired sensation, impaired UE functional use, improper body mechanics, and pain  ACTIVITY LIMITATIONS: carrying, lifting, bending, squatting, transfers, bed mobility, toileting, dressing, reach over head, hygiene/grooming, locomotion level, and caring for others  PARTICIPATION LIMITATIONS: meal prep, cleaning, laundry, medication management, driving, shopping,  community activity, occupation, and yard work  PERSONAL FACTORS: Fitness, Past/current experiences, and 1 comorbidity: quadriplegia  are also affecting patient's functional outcome.   REHAB POTENTIAL: Excellent  CLINICAL DECISION MAKING: Evolving/moderate complexity  EVALUATION COMPLEXITY: Moderate  PLAN:  PT FREQUENCY: 2x/week  PT DURATION: 6 weeks  PLANNED INTERVENTIONS: 97164- PT Re-evaluation, 97110-Therapeutic exercises, 97530- Therapeutic activity, 97112- Neuromuscular re-education, 97535- Self Care, 96295- Manual therapy, [phone]- Gait training, [phone]- Orthotic Fit/training, [phone]- Aquatic Therapy, [phone]- Electrical stimulation (manual), Patient/Family education, Balance training, Stair training, Dry Needling, Joint mobilization, Spinal mobilization, Vestibular training, and DME instructions  PLAN FOR NEXT SESSION: Monitor vitals- pt very hypotensive. Work on return to hiking; NBOS, eyes closed, eccentric control, endurance   Rebecca Campus, PT Rebecca Campus, PT, DPT, CBIS  10/28/2023, 11:49 AM

## 2023-10-28 NOTE — Patient Instructions (Signed)
   Safety considerations for loss of sensation:   Look at affected hand when using it!   Do NOT use affected arm for anything: sharp, hot, breakable, or too heavy  Always check temperature of water (for showering, washing dishes, etc) with UNaffected arm/extremity  Consider travel mugs w/ lids to transport hot liquids/coffee  Consider alternative options and/or adaptive equipment to make things safer (ex: hand chopper or cut resistant glove for chopping vegetables)   Avoid cold temperatures as well (wear glove in cold temperatures, get ice w/ unaffected extremity)  AVOID handling chemicals and machinery
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
## 2023-10-29 ENCOUNTER — Encounter: Payer: Self-pay | Admitting: Physical Medicine and Rehabilitation
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
## 2023-11-01 ENCOUNTER — Ambulatory Visit: Admitting: Occupational Therapy

## 2023-11-01 ENCOUNTER — Telehealth: Payer: Self-pay

## 2023-11-01 ENCOUNTER — Ambulatory Visit: Admitting: Physical Therapy

## 2023-11-01 VITALS — BP 82/53 | HR 70

## 2023-11-01 DIAGNOSIS — R278 Other lack of coordination: Secondary | ICD-10-CM | POA: Diagnosis not present

## 2023-11-01 DIAGNOSIS — R2689 Other abnormalities of gait and mobility: Secondary | ICD-10-CM | POA: Diagnosis not present

## 2023-11-01 DIAGNOSIS — R29818 Other symptoms and signs involving the nervous system: Secondary | ICD-10-CM

## 2023-11-01 DIAGNOSIS — R29898 Other symptoms and signs involving the musculoskeletal system: Secondary | ICD-10-CM | POA: Diagnosis not present

## 2023-11-01 DIAGNOSIS — M25512 Pain in left shoulder: Secondary | ICD-10-CM

## 2023-11-01 DIAGNOSIS — R208 Other disturbances of skin sensation: Secondary | ICD-10-CM | POA: Diagnosis not present

## 2023-11-01 DIAGNOSIS — M6281 Muscle weakness (generalized): Secondary | ICD-10-CM

## 2023-11-01 DIAGNOSIS — G8252 Quadriplegia, C1-C4 incomplete: Secondary | ICD-10-CM

## 2023-11-01 DIAGNOSIS — R2681 Unsteadiness on feet: Secondary | ICD-10-CM | POA: Diagnosis not present

## 2023-11-01 NOTE — Patient Instructions (Addendum)
 Constipation:   1.  Increase fiber intake (vegetables) 2.  Regular, moderate exercise can be beneficial. 3.  Increase water  intake.     Rancho recipe:  Mix together: ? 1 cup apple sauce ? 1 cup oat bran or unprocessed wheat bran ? 1 cup prune juice  This recipe helps to increase dietary fiber intake and promotes regular bowel function. You may have a bloated feeling and more gas when adding fiber to your diet, but this should pass in a few weeks.  Begin with 1-2 tablespoons each evening mixed with or followed by one 6-8 ounce cup of water  or juice. After two weeks you will have softer and more regular bowel movements. If no change occurs, slowly increase the amount to 3-4 tablespoons. Plan to make this part of your daily routine for the rest of your lifetime.  You can store the mixture in your refrigerator or freezer. You can also freeze 1-2 tablespoon servings in sectioned ice cube trays thaw as needed.

## 2023-11-01 NOTE — Therapy (Signed)
 OUTPATIENT PHYSICAL THERAPY NEURO TREATMENT   Patient Name: Brandon Watts MRN: [id number] DOB:20-Aug-1945, 78 y.o., male Today's Date: 11/01/2023   PCP: Colene Dauphin, MD REFERRING PROVIDER: Sterling Eisenmenger, PA-C  END OF SESSION:  PT End of Session - 11/01/23 1104     Visit Number 6    Number of Visits 13    Date for PT Re-Evaluation 12/07/23    Authorization Type [employer] Medicare    PT Start Time 1104   Handoff w/OT   PT Stop Time 1144    PT Time Calculation (min) 40 min    Equipment Utilized During Treatment Cervical collar;Other (comment);Gait belt   TED hose and ACE wraps   Activity Tolerance Patient limited by pain    Behavior During Therapy WFL for tasks assessed/performed                Past Medical History:  Diagnosis Date   Allergic rhinitis    Arthritis    PAIN AND OA RIGHT HIP   Arthritis    BPH (benign prostatic hyperplasia)    DDD (degenerative disc disease), lumbar    MILD-FOUND ON XRAY   Glaucoma    normotensive type with early changes   Glaucoma    Hepatitis    HEPATITIS C -CLEARED   Hepatitis    Hep C, treated and cleared   HLD (hyperlipidemia)    taking statin as preventative   Hypertension    Insomnia    CHRONIC   Insomnia    Positive PPD, treated    Pulmonary nodules    FOLLOWED BY DR. Armida Berry GATES WITH CT EXAMS-MOST RECENT 01/22/12   Status post trigger finger release    right   Past Surgical History:  Procedure Laterality Date   ANTERIOR CERVICAL DECOMP/DISCECTOMY FUSION N/A 09/09/2023   Procedure: ANTERIOR CERVICAL DECOMPRESSION/DISCECTOMY FUSION 2 LEVEL/HARDWARE REMOVAL - C 3-4 , C4-5;  Surgeon: Van Gelinas, MD;  Location: [facility];  Service: Neurosurgery;  Laterality: N/A;   EYE SURGERY     LASIK EYE SURGERY   LITHOTRIPSY     x 3   LITHOTRIPSY     x2   TONSILLECTOMY     AS A CHILD   TONSILLECTOMY     TOTAL HIP ARTHROPLASTY Right 10/25/2012   Procedure: RIGHT TOTAL HIP ARTHROPLASTY ANTERIOR APPROACH;  Surgeon:  Bevin Bucks, MD;  Location: [facility];  Service: Orthopedics;  Laterality: Right;   TOTAL HIP ARTHROPLASTY Right    TRIGGER FINGER RELEASE Right 2016   third digit right hand. Dr.Supple   URETERAL STONE EXTRACTION S X 2     VASCECTOMY     Patient Active Problem List   Diagnosis Date Noted   Hypotension 10/22/2023   Gross hematuria 10/22/2023   Constipation 10/22/2023   Quadriplegia, C1-C4, incomplete (HCC) 09/18/2023   C4 cervical fracture (HCC) 09/17/2023   Central cord syndrome (HCC) 09/17/2023   Dysphagia 09/17/2023   Respiratory failure with hypoxia (HCC) 09/17/2023   SDH (subdural hematoma) (HCC) 09/17/2023   Malnutrition of moderate degree 09/15/2023   Acute pulmonary embolism without acute cor pulmonale (HCC) 09/15/2023   Post-nasal drainage 09/15/2023   Pneumonia of left lower lobe due to infectious organism 09/15/2023   Hyperlipidemia 09/08/2023   Diastolic dysfunction without heart failure 09/17/2022   Episodic lightheadedness 08/26/2022   Vasovagal near syncope 08/26/2022   Hypercalcemia 08/25/2022   Hyperglycemia 08/25/2022   Acute left-sided low back pain with left-sided sciatica 10/13/2021   Left renal mass  10/12/2021   Performance anxiety 08/17/2021   Glaucoma 08/14/2021   Insomnia 08/14/2021   Hyperlipoproteinemia 08/14/2021   BPH (benign prostatic hyperplasia) 08/14/2021   Erectile dysfunction 08/14/2021   Trigger finger, acquired 07/11/2013   S/P right THA, AA 10/25/2012   BACK PAIN, LUMBAR 01/22/2010   NONSPEC REACT TUBERCULIN SKN TEST W/O ACTV TB 06/13/2009   HEPATITIS C 02/12/2007   ALLERGIC RHINITIS 02/12/2007   History of colonic polyps 02/12/2007    ONSET DATE: 10/08/2023 (referral)   REFERRING DIAG: G82.52 (ICD-10-CM) - Quadriplegia, C1-C4 incomplete S12.300A (ICD-10-CM) - Unspecified displaced fracture of fourth cervical vertebra, initial encounter for closed fracture S14.129A (ICD-10-CM) - Central cord syndrome at unspecified level of cervical  spinal cord, initial encounter  THERAPY DIAG:  Muscle weakness (generalized)  Other lack of coordination  Quadriplegia, C1-C4, incomplete (HCC)  Unsteadiness on feet  Rationale for Evaluation and Treatment: Rehabilitation  SUBJECTIVE:                                                                                                                                                                                             SUBJECTIVE STATEMENT:  Patient received from OT. Reports increased pain today. Reduced his Gabapentin  intake and is no longer wearing the abdominal binder, is getting annoying. Has backed off of his HEP, is very sore after. Had a rough morning.   Pt accompanied by:  Self   PERTINENT HISTORY: HTN, HLD, BPH, Glaucoma, Hx of hep C s/p treatment  PAIN:  Are you having pain? Yes: NPRS scale: 6-7/10  Pain location: Bilateral shoulder girdles L > R  Pain description: Achy  Aggravating factors: Moving arm Relieving factors: Pain meds  PRECAUTIONS: Cervical, Fall, and Other: No lifting over 5#, foley catheter  RED FLAGS: None   WEIGHT BEARING RESTRICTIONS: No  FALLS: Has patient fallen in last 6 months? No  LIVING ENVIRONMENT: Lives with: lives with their spouse Lives in: House/apartment Stairs: Yes: Internal: 16 steps; on right going up, on left going up, and can reach both and External: 3 + 1 steps; on right going up, on left going up, and can reach both Has following equipment at home: Otho Blitz - 4 wheeled, Shower bench, and bed side commode  PLOF: Independent  PATIENT GOALS: "To be able to hike in the [place] in August and in [place] in 07/2024. I would like to be able to play the cello again and walk my dog along the trails in about a month"   OBJECTIVE:  Note: Objective measures were completed at Evaluation unless otherwise noted.  DIAGNOSTIC FINDINGS: MRI of C-spine from 09/08/2023  IMPRESSION: 1. Signal  abnormality within the ventral spinal cord  at the C3-C4 level likely reflecting cord contusion. 2. Edema at site of a known acute fracture within the C4 spinous process. 3. Ligamentum flavum irregularity at C4-C5 consistent with ligamentum flavum injury, and possible ligamentum flavum disruption. 4. Edema signal within the interspinous spaces at C3-C4, C4-C5, C5-C6, C6-C7 and C7-T1 consistent with interspinous ligament injury. 5. Thinned appearance of the anterior longitudinal ligament at the C3 level suspicious for anterior longitudinal ligament injury (without complete ligamentous disruption). 6. Prevertebral edema/hematoma spanning the C2-C6 levels. 7. Cervical spondylosis as outlined within the body of the report. Moderate spinal canal stenosis at C3-C4 and C4-C5. Multilevel foraminal stenosis, greatest bilaterally at C3-C4 (moderate/severe) and on the left at C4-C5 (moderate). 8. Vertebral body and left-sided facet ankylosis at C2-C3.  COGNITION: Overall cognitive status: Within functional limits for tasks assessed   SENSATION: Stocking numbness in BLEs   POSTURE: rounded shoulders and pt wearing cervical collar  LOWER EXTREMITY ROM:     Active  Right Eval Left Eval  Hip flexion    Hip extension    Hip abduction    Hip adduction    Hip internal rotation    Hip external rotation    Knee flexion    Knee extension    Ankle dorsiflexion    Ankle plantarflexion    Ankle inversion    Ankle eversion     (Blank rows = not tested)  LOWER EXTREMITY MMT:  Tested in seated position   MMT Right Eval Left Eval  Hip flexion 4- 4-  Hip extension    Hip abduction 5 5  Hip adduction 5 5  Hip internal rotation    Hip external rotation    Knee flexion 5 5  Knee extension 5 5  Ankle dorsiflexion 5 5  Ankle plantarflexion    Ankle inversion    Ankle eversion    (Blank rows = not tested)  BED MOBILITY:  Pt reports he needs more support to get out of his bed at home due to having to get out on the L side (was  getting out on the R side in the hospital) and has difficulty propping up on L shoulder due to pain  TRANSFERS: Assistive device utilized: Environmental consultant - 4 wheeled  Sit to stand: SBA Stand to sit: SBA Chair to chair: SBA Noted decreased anterior weight shift initially w/minor lateral shift to L side. Poor eccentric control w/fatigue   STAIRS: Level of Assistance: SBA Stair Negotiation Technique: Alternating Pattern  with Bilateral Rails Number of Stairs: 4  Height of Stairs: 6"  Comments: Decreased eccentric control on RLE   GAIT: Gait pattern: step through pattern, lateral hip instability, trunk flexed, and wide BOS Distance walked: Various clinic distances  Assistive device utilized: Environmental consultant - 4 wheeled and None Level of assistance: Modified independence and SBA Comments: Pt is mod I w/rollator and SBA w/no AD due to anterior instability and decreased visual scanning ability   VITALS: Assessed in RUE in seated and standing position  Vitals:   11/01/23 1115 11/01/23 1116  BP: 119/78 (!) 82/53  Pulse: 63 70  TREATMENT :  Self-care/home management  Assessed vitals (see above) in seated and standing positions and pt's BP continues to drop >25 points systolic and diastolic. Pt denied symptoms of hypotension today.  Pt inquiring about using a "Cold Man" and discouraged this as pt has poor tolerance to cold and has no true reason to use it on his legs. Pt considering using it on his L shoulder, so encouraged him to try a small ice pack before the Cold Man to assess his response to cold therapy. Pt verbalized understanding.  Pt requesting to remove soft collar and states MD told him he could in therapy, so allowed it. Showed pt how to perform prone lying w/GENTLE cervical extension to neutral position to work on paraspinal strength. Pt reports he has been doing downward dogs and cat cows at home so  can work on this. Advised pt against downward dogs and cervical motions w/cat cows as this breaks precautions. Pt verbalized understanding.   NMR  In // bars for improved single leg stability, functional quad strength and postural control. All performed without soft brace collar donned but pt did well adhering to cervical precautions:  Alt step ups w/contralateral march to bosu (blue side), x10 reps per side w/intermittent UE support. No ankle instability noted and pt reported no difficulty w/activity.  On rocker board in A/P direction, practiced heel/toe raises as pt reports he falls backwards w/toe raises at home. Noted pt not utilizing hip strategy properly, so cued pt to facilitate this for counterbalance and pt able to perform well w/no retropulsion.  Alternating curtsy lunges w/BUE support on rail, x5 reps per side. Pt denied difficulty w/movement but stated he needed to stop squatting, so halted activity    Reassessed vitals in seated and standing position in RUE at end of session as follows:  Seated: 114/64 mmHg, HR 61 bpm  Standing: 87/50 mmHg, HR 74 bpm. Pt asymptomatic    PATIENT EDUCATION: Education details: See self-care  Person educated: Patient Education method: Explanation and Demonstration Education comprehension: verbalized understanding and needs further education  HOME EXERCISE PROGRAM: From CIRDeronda Flesher; [id number]  Desensitization Techniques: -Light touch/pressure:  using fingertip pressure to slowly move up small segments of the affected body part until outside the area of pain and then back to where you started -Deep pressure:  using fingertips to squeeze in small segments starting outside the affected area, crossing over the affected area, and then to the other side of the affected area -Tapping:  fingertips tap with firm pressure over the affected area, can move around the affected area as well -Brushing/scratching:  use fingertips to brush/scratch over and around  the affected area -Textures:  use soft and rough textured items like cotton balls vs wash cloths to brush or rub with light into firm pressure over and around the affected area  Repeat these 3-4x per day.   GOALS: Goals reviewed with patient? Yes  SHORT TERM GOALS: Target date: 11/09/2023   Pt will be independent with initial HEP for improved strength, balance, transfers and gait.  Baseline: Goal status: INITIAL  2.  MCTSIB to be assessed and STG/LTG updated ;  hold condition 4 on mCTSIB with minimal to no postural sway for improved balance. (SD) Baseline: 30 sec hold with moderate postural sway Goal status: INITIAL  3.  to be assessed and LTG updated  Baseline: 1282.10' Goal status: Goal met 10-18-23  4.  Modified FGA to be assessed and LTG updated  Baseline: 22/30 Goal status:  MET  LONG TERM GOALS: Target date: 11/23/2023   Patient will score >/=25/30 (accounting for not being able to complete head turns) on FGA to demonstrate improved walking balance Baseline: 22/30 Goal status: REVISED  2.  goal - increase distance to >1400' for increased endurance/gait efficiency. (SD) Baseline:  Goal status: INITIAL  3.  MCTSIB goal -  hold condition 4 on mCTSIB with minimal to no postural sway for improved balance. (SD) Baseline:  Goal status: INITIAL  4.  Pt will return to walking dog on trail w/SBA and no instability for return to PLOF and improved independence  Baseline:  Goal status: INITIAL   ASSESSMENT:  CLINICAL IMPRESSION: Session limited as pt much more fatigued today and reported increased pain levels. Pt reduced his Gabapentin  dosage to 2x/day and has been experiencing worse pain since. Pt reports he does not want to increase dose back to 3x/day until Dr. Raynaldo Call responds to his message. Pt reports he is impacted and tried to manually assist with this last night without luck. Pt continues to be limited by orthostatics but is no longer wearing the abdominal  binder as it is "too much" and BP is not worse without the binder. Educated pt on working on gentle cervical paraspinal strength at home without collar on if able w/strong emphasis on adhering to cervical precautions. Continue POC.    OBJECTIVE IMPAIRMENTS: Abnormal gait, decreased activity tolerance, decreased balance, decreased coordination, decreased endurance, decreased mobility, decreased strength, dizziness, impaired sensation, impaired UE functional use, improper body mechanics, and pain  ACTIVITY LIMITATIONS: carrying, lifting, bending, squatting, transfers, bed mobility, toileting, dressing, reach over head, hygiene/grooming, locomotion level, and caring for others  PARTICIPATION LIMITATIONS: meal prep, cleaning, laundry, medication management, driving, shopping, community activity, occupation, and yard work  PERSONAL FACTORS: Fitness, Past/current experiences, and 1 comorbidity: quadriplegia  are also affecting patient's functional outcome.   REHAB POTENTIAL: Excellent  CLINICAL DECISION MAKING: Evolving/moderate complexity  EVALUATION COMPLEXITY: Moderate  PLAN:  PT FREQUENCY: 2x/week  PT DURATION: 6 weeks  PLANNED INTERVENTIONS: 97164- PT Re-evaluation, 97110-Therapeutic exercises, 97530- Therapeutic activity, 97112- Neuromuscular re-education, 97535- Self Care, 16109- Manual therapy, [phone]- Gait training, [phone]- Orthotic Fit/training, [phone]- Aquatic Therapy, [phone]- Electrical stimulation (manual), Patient/Family education, Balance training, Stair training, Dry Needling, Joint mobilization, Spinal mobilization, Vestibular training, and DME instructions  PLAN FOR NEXT SESSION: Monitor vitals- pt very hypotensive. Work on return to hiking; NBOS, eyes closed, eccentric control, endurance   Edi Gorniak E Deloros Beretta, PT, DPT  11/01/2023, 11:44 AM

## 2023-11-01 NOTE — Telephone Encounter (Signed)
 Copied from CRM [id number]. Topic: General - Other >> Nov 01, 2023 10:02 AM Rosamond Comes wrote: Reason for CRM: patient calling in requesting to talk only to nurse  Dr. Grayce Lazar nurse

## 2023-11-02 NOTE — Telephone Encounter (Signed)
 Spoke with patient today.  Matter has been resolved.  Dr. Donnette Gal was currently also going to respond to his experience on yesterday regarding the call center.

## 2023-11-04 ENCOUNTER — Ambulatory Visit: Admitting: Physical Therapy

## 2023-11-04 ENCOUNTER — Ambulatory Visit: Admitting: Occupational Therapy

## 2023-11-04 VITALS — BP 94/60 | HR 62

## 2023-11-04 DIAGNOSIS — R2681 Unsteadiness on feet: Secondary | ICD-10-CM | POA: Diagnosis not present

## 2023-11-04 DIAGNOSIS — M6281 Muscle weakness (generalized): Secondary | ICD-10-CM

## 2023-11-04 DIAGNOSIS — R29898 Other symptoms and signs involving the musculoskeletal system: Secondary | ICD-10-CM | POA: Diagnosis not present

## 2023-11-04 DIAGNOSIS — R2689 Other abnormalities of gait and mobility: Secondary | ICD-10-CM

## 2023-11-04 DIAGNOSIS — R29818 Other symptoms and signs involving the nervous system: Secondary | ICD-10-CM | POA: Diagnosis not present

## 2023-11-04 DIAGNOSIS — M25512 Pain in left shoulder: Secondary | ICD-10-CM | POA: Diagnosis not present

## 2023-11-04 DIAGNOSIS — R208 Other disturbances of skin sensation: Secondary | ICD-10-CM

## 2023-11-04 DIAGNOSIS — G8252 Quadriplegia, C1-C4 incomplete: Secondary | ICD-10-CM | POA: Diagnosis not present

## 2023-11-04 DIAGNOSIS — R278 Other lack of coordination: Secondary | ICD-10-CM | POA: Diagnosis not present

## 2023-11-04 NOTE — Therapy (Signed)
 OUTPATIENT PHYSICAL THERAPY NEURO TREATMENT   Patient Name: Brandon Watts MRN: [id number] DOB:01/02/1946, 78 y.o., male Today's Date: 11/05/2023   PCP: Colene Dauphin, MD REFERRING PROVIDER: Sterling Eisenmenger, PA-C  END OF SESSION:  PT End of Session - 11/04/23 1015     Visit Number 7    Number of Visits 13    Date for PT Re-Evaluation 12/07/23    Authorization Type [employer] Medicare    PT Start Time 1015    PT Stop Time 1100    PT Time Calculation (min) 45 min    Equipment Utilized During Treatment Cervical collar   TED hose and ACE wraps   Activity Tolerance Patient limited by pain    Behavior During Therapy WFL for tasks assessed/performed                 Past Medical History:  Diagnosis Date   Allergic rhinitis    Arthritis    PAIN AND OA RIGHT HIP   Arthritis    BPH (benign prostatic hyperplasia)    DDD (degenerative disc disease), lumbar    MILD-FOUND ON XRAY   Glaucoma    normotensive type with early changes   Glaucoma    Hepatitis    HEPATITIS C -CLEARED   Hepatitis    Hep C, treated and cleared   HLD (hyperlipidemia)    taking statin as preventative   Hypertension    Insomnia    CHRONIC   Insomnia    Positive PPD, treated    Pulmonary nodules    FOLLOWED BY DR. Armida Berry GATES WITH CT EXAMS-MOST RECENT 01/22/12   Status post trigger finger release    right   Past Surgical History:  Procedure Laterality Date   ANTERIOR CERVICAL DECOMP/DISCECTOMY FUSION N/A 09/09/2023   Procedure: ANTERIOR CERVICAL DECOMPRESSION/DISCECTOMY FUSION 2 LEVEL/HARDWARE REMOVAL - C 3-4 , C4-5;  Surgeon: Van Gelinas, MD;  Location: [facility];  Service: Neurosurgery;  Laterality: N/A;   EYE SURGERY     LASIK EYE SURGERY   LITHOTRIPSY     x 3   LITHOTRIPSY     x2   TONSILLECTOMY     AS A CHILD   TONSILLECTOMY     TOTAL HIP ARTHROPLASTY Right 10/25/2012   Procedure: RIGHT TOTAL HIP ARTHROPLASTY ANTERIOR APPROACH;  Surgeon: Bevin Bucks, MD;  Location: [facility];   Service: Orthopedics;  Laterality: Right;   TOTAL HIP ARTHROPLASTY Right    TRIGGER FINGER RELEASE Right 2016   third digit right hand. Dr.Supple   URETERAL STONE EXTRACTION S X 2     VASCECTOMY     Patient Active Problem List   Diagnosis Date Noted   Hypotension 10/22/2023   Gross hematuria 10/22/2023   Constipation 10/22/2023   Quadriplegia, C1-C4, incomplete (HCC) 09/18/2023   C4 cervical fracture (HCC) 09/17/2023   Central cord syndrome (HCC) 09/17/2023   Dysphagia 09/17/2023   Respiratory failure with hypoxia (HCC) 09/17/2023   SDH (subdural hematoma) (HCC) 09/17/2023   Malnutrition of moderate degree 09/15/2023   Acute pulmonary embolism without acute cor pulmonale (HCC) 09/15/2023   Post-nasal drainage 09/15/2023   Pneumonia of left lower lobe due to infectious organism 09/15/2023   Hyperlipidemia 09/08/2023   Diastolic dysfunction without heart failure 09/17/2022   Episodic lightheadedness 08/26/2022   Vasovagal near syncope 08/26/2022   Hypercalcemia 08/25/2022   Hyperglycemia 08/25/2022   Acute left-sided low back pain with left-sided sciatica 10/13/2021   Left renal mass 10/12/2021   Performance  anxiety 08/17/2021   Glaucoma 08/14/2021   Insomnia 08/14/2021   Hyperlipoproteinemia 08/14/2021   BPH (benign prostatic hyperplasia) 08/14/2021   Erectile dysfunction 08/14/2021   Trigger finger, acquired 07/11/2013   S/P right THA, AA 10/25/2012   BACK PAIN, LUMBAR 01/22/2010   NONSPEC REACT TUBERCULIN SKN TEST W/O ACTV TB 06/13/2009   HEPATITIS C 02/12/2007   ALLERGIC RHINITIS 02/12/2007   History of colonic polyps 02/12/2007    ONSET DATE: 10/08/2023 (referral)   REFERRING DIAG: G82.52 (ICD-10-CM) - Quadriplegia, C1-C4 incomplete S12.300A (ICD-10-CM) - Unspecified displaced fracture of fourth cervical vertebra, initial encounter for closed fracture S14.129A (ICD-10-CM) - Central cord syndrome at unspecified level of cervical spinal cord, initial  encounter  THERAPY DIAG:  Muscle weakness (generalized)  Quadriplegia, C1-C4, incomplete (HCC)  Unsteadiness on feet  Other abnormalities of gait and mobility  Other symptoms and signs involving the musculoskeletal system  Rationale for Evaluation and Treatment: Rehabilitation  SUBJECTIVE:                                                                                                                                                                                             SUBJECTIVE STATEMENT:  Pt denies any acute changes since last visit. Pt with ongoing parasthesia and hyperethesia primarily in his LUE and L shoulder girdle region. Pt reports that the intensity of his pain can fluctuate depending on what activities he is doing and how active he has been. Pt interested in trialing dry needling today to address pain in LUE and L shoulder girdle region.  Pt also reports having more pain after doing his exercises and can be very sore the next day. Pt also wanting to review his current HEP.  Pt accompanied by:  Self   PERTINENT HISTORY: HTN, HLD, BPH, Glaucoma, Hx of hep C s/p treatment  PAIN:  Are you having pain? Yes: NPRS scale: 6-7/10  Pain location: Bilateral shoulder girdles L > R  Pain description: Achy  Aggravating factors: Moving arm Relieving factors: Pain meds  PRECAUTIONS: Cervical, Fall, and Other: No lifting over 5#, foley catheter  RED FLAGS: None   WEIGHT BEARING RESTRICTIONS: No  FALLS: Has patient fallen in last 6 months? No  LIVING ENVIRONMENT: Lives with: lives with their spouse Lives in: House/apartment Stairs: Yes: Internal: 16 steps; on right going up, on left going up, and can reach both and External: 3 + 1 steps; on right going up, on left going up, and can reach both Has following equipment at home: Walker - 4 wheeled, Shower bench, and bed side commode  PLOF: Independent  PATIENT GOALS: "To be able to  hike in the [place] in August and in  [place] in 07/2024. I would like to be able to play the cello again and walk my dog along the trails in about a month"   OBJECTIVE:  Note: Objective measures were completed at Evaluation unless otherwise noted.  DIAGNOSTIC FINDINGS: MRI of C-spine from 09/08/2023  IMPRESSION: 1. Signal abnormality within the ventral spinal cord at the C3-C4 level likely reflecting cord contusion. 2. Edema at site of a known acute fracture within the C4 spinous process. 3. Ligamentum flavum irregularity at C4-C5 consistent with ligamentum flavum injury, and possible ligamentum flavum disruption. 4. Edema signal within the interspinous spaces at C3-C4, C4-C5, C5-C6, C6-C7 and C7-T1 consistent with interspinous ligament injury. 5. Thinned appearance of the anterior longitudinal ligament at the C3 level suspicious for anterior longitudinal ligament injury (without complete ligamentous disruption). 6. Prevertebral edema/hematoma spanning the C2-C6 levels. 7. Cervical spondylosis as outlined within the body of the report. Moderate spinal canal stenosis at C3-C4 and C4-C5. Multilevel foraminal stenosis, greatest bilaterally at C3-C4 (moderate/severe) and on the left at C4-C5 (moderate). 8. Vertebral body and left-sided facet ankylosis at C2-C3.  COGNITION: Overall cognitive status: Within functional limits for tasks assessed   SENSATION: Stocking numbness in BLEs   POSTURE: rounded shoulders and pt wearing cervical collar  LOWER EXTREMITY ROM:     Active  Right Eval Left Eval  Hip flexion    Hip extension    Hip abduction    Hip adduction    Hip internal rotation    Hip external rotation    Knee flexion    Knee extension    Ankle dorsiflexion    Ankle plantarflexion    Ankle inversion    Ankle eversion     (Blank rows = not tested)  LOWER EXTREMITY MMT:  Tested in seated position   MMT Right Eval Left Eval  Hip flexion 4- 4-  Hip extension    Hip abduction 5 5  Hip adduction 5  5  Hip internal rotation    Hip external rotation    Knee flexion 5 5  Knee extension 5 5  Ankle dorsiflexion 5 5  Ankle plantarflexion    Ankle inversion    Ankle eversion    (Blank rows = not tested)  BED MOBILITY:  Pt reports he needs more support to get out of his bed at home due to having to get out on the L side (was getting out on the R side in the hospital) and has difficulty propping up on L shoulder due to pain  TRANSFERS: Assistive device utilized: Environmental consultant - 4 wheeled  Sit to stand: SBA Stand to sit: SBA Chair to chair: SBA Noted decreased anterior weight shift initially w/minor lateral shift to L side. Poor eccentric control w/fatigue   STAIRS: Level of Assistance: SBA Stair Negotiation Technique: Alternating Pattern  with Bilateral Rails Number of Stairs: 4  Height of Stairs: 6"  Comments: Decreased eccentric control on RLE   GAIT: Gait pattern: step through pattern, lateral hip instability, trunk flexed, and wide BOS Distance walked: Various clinic distances  Assistive device utilized: Environmental consultant - 4 wheeled and None Level of assistance: Modified independence and SBA Comments: Pt is mod I w/rollator and SBA w/no AD due to anterior instability and decreased visual scanning ability   VITALS: Assessed in RUE in seated and standing position  Vitals:   11/05/23 0914 11/05/23 0916  BP: 125/72 94/60  Pulse: (!) 51 62  TREATMENT :   TherEx Review of HEP: progressed from 2.5# ankle weights to 5# ankle weights with standing marches and supine SLR progressed from 1# hammer curls to 5# on RUE, continue with 1# hammer on LUE  TherAct Trigger Point Dry Needling  Initial Treatment: Pt instructed on Dry Needling rational, procedures, and possible side effects. Pt instructed to expect mild to moderate muscle soreness later in the day and/or into the next day.  Pt instructed  in methods to reduce muscle soreness. Pt instructed to continue prescribed HEP. Because Dry Needling was performed over or adjacent to a lung field, pt was educated on S/S of pneumothorax and to seek immediate medical attention should they occur.  Patient was educated on signs and symptoms of infection and other risk factors and advised to seek medical attention should they occur.  Patient verbalized understanding of these instructions and education.   Patient Verbal Consent Given: Yes Education Handout Provided: Yes Muscles Treated: L middle delt/bicep, L UT (very painful), L middle trap/rhomboids Electrical Stimulation Performed: No Treatment Response/Outcome: cramping feeling; muscle twitch detected, LOTS OF PAIN IN L UPPER TRAP - POOR TOLERANCE TO THIS AREA; decreased pain with L shoulder pendulums compared to before treatment, pt reports "improved winging" on L side and demonstrates improved shoulder abduction/lateral raise  Pt does report feeling lightheaded after TPDN but declines to lay supine, symptoms improve after seated rest break x 5 min. At this point assessed BP: Seated BP: 125/72, HR 51 Standing BP 94/60, HR 62     Trigger Point Dry Needling  What is Trigger Point Dry Needling (DN)? DN is a physical therapy technique used to treat muscle pain and dysfunction. Specifically, DN helps deactivate muscle trigger points (muscle knots).  A thin filiform needle is used to penetrate the skin and stimulate the underlying trigger point. The goal is for a local twitch response (LTR) to occur and for the trigger point to relax. No medication of any kind is injected during the procedure.   What Does Trigger Point Dry Needling Feel Like?  The procedure feels different for each individual patient. Some patients report that they do not actually feel the needle enter the skin and overall the process is not painful. Very mild bleeding may occur. However, many patients feel a deep cramping in  the muscle in which the needle was inserted. This is the local twitch response.   How Will I feel after the treatment? Soreness is normal, and the onset of soreness may not occur for a few hours. Typically this soreness does not last longer than two days.  Bruising is uncommon, however; ice can be used to decrease any possible bruising.  In rare cases feeling tired or nauseous after the treatment is normal. In addition, your symptoms may get worse before they get better, this period will typically not last longer than 24 hours.   What Can I do After My Treatment? Increase your hydration by drinking more water  for the next 24 hours.  You may place ice or heat on the areas treated that have become sore, however, do not use heat on inflamed or bruised areas. Heat often brings more relief post needling. You can continue your regular activities, but vigorous activity is not recommended initially after the treatment for 24 hours. DN is best combined with other physical therapy such as strengthening, stretching, and other therapies.   What are the complications? While your therapist has had extensive training in minimizing the risks of trigger point dry  needling, it is important to understand the risks of any procedure.  Risks include bleeding, pain, fatigue, hematoma, infection, vertigo, nausea or nerve involvement. Monitor for any changes to your skin or sensation. Contact your therapist or MD with concerns.  A rare but serious complication is a pneumothorax over or near your middle and upper chest and back If you have dry needling in this area, monitor for the following symptoms: Shortness of breath on exertion and/or Difficulty taking a deep breath and/or Chest Pain and/or A dry cough If any of the above symptoms develop, please go to the nearest emergency room or call 911. Tell them you had dry needling over your thorax and report any symptoms you are having. Please follow-up with your treating  therapist after you complete the medical evaluation.      PATIENT EDUCATION: Education details: HEP adjustments (see above), TPDN (see above) Person educated: Patient Education method: Explanation, Demonstration, and Handouts Education comprehension: verbalized understanding, returned demonstration, and needs further education  HOME EXERCISE PROGRAM: From CIRDeronda Flesher; [id number]  Desensitization Techniques: -Light touch/pressure:  using fingertip pressure to slowly move up small segments of the affected body part until outside the area of pain and then back to where you started -Deep pressure:  using fingertips to squeeze in small segments starting outside the affected area, crossing over the affected area, and then to the other side of the affected area -Tapping:  fingertips tap with firm pressure over the affected area, can move around the affected area as well -Brushing/scratching:  use fingertips to brush/scratch over and around the affected area -Textures:  use soft and rough textured items like cotton balls vs wash cloths to brush or rub with light into firm pressure over and around the affected area  Repeat these 3-4x per day.   GOALS: Goals reviewed with patient? Yes  SHORT TERM GOALS: Target date: 11/09/2023   Pt will be independent with initial HEP for improved strength, balance, transfers and gait.  Baseline: Goal status: INITIAL  2.  MCTSIB to be assessed and STG/LTG updated ;  hold condition 4 on mCTSIB with minimal to no postural sway for improved balance. (SD) Baseline: 30 sec hold with moderate postural sway Goal status: INITIAL  3.  to be assessed and LTG updated  Baseline: 1282.10' Goal status: Goal met 10-18-23  4.  Modified FGA to be assessed and LTG updated  Baseline: 22/30 Goal status: MET  LONG TERM GOALS: Target date: 11/23/2023   Patient will score >/=25/30 (accounting for not being able to complete head turns) on FGA to demonstrate improved  walking balance Baseline: 22/30 Goal status: REVISED  2.  goal - increase distance to >1400' for increased endurance/gait efficiency. (SD) Baseline:  Goal status: INITIAL  3.  MCTSIB goal -  hold condition 4 on mCTSIB with minimal to no postural sway for improved balance. (SD) Baseline:  Goal status: INITIAL  4.  Pt will return to walking dog on trail w/SBA and no instability for return to PLOF and improved independence  Baseline:  Goal status: INITIAL   ASSESSMENT:  CLINICAL IMPRESSION: Emphasis of skilled PT session on initiating TPDN to treat trigger points affecting LUE and L shoulder girdle region, assessing BP, and revising HEP. Pt with good overall response to TPDN aside from his L UT which has severe pain with DN. Can better assess response overall to DN next visit but pt does report improved mobility of his L shoulder following treatment. Pt continues to exhibit  orthostatic hypotension so remainder of session performed in sitting and supine. Upgraded HEP with added weight for UE and LE exercises (see above). Pt continues to benefit from skilled PT services to work towards LTGs. Continue POC.    OBJECTIVE IMPAIRMENTS: Abnormal gait, decreased activity tolerance, decreased balance, decreased coordination, decreased endurance, decreased mobility, decreased strength, dizziness, impaired sensation, impaired UE functional use, improper body mechanics, and pain  ACTIVITY LIMITATIONS: carrying, lifting, bending, squatting, transfers, bed mobility, toileting, dressing, reach over head, hygiene/grooming, locomotion level, and caring for others  PARTICIPATION LIMITATIONS: meal prep, cleaning, laundry, medication management, driving, shopping, community activity, occupation, and yard work  PERSONAL FACTORS: Fitness, Past/current experiences, and 1 comorbidity: quadriplegia  are also affecting patient's functional outcome.   REHAB POTENTIAL: Excellent  CLINICAL DECISION MAKING:  Evolving/moderate complexity  EVALUATION COMPLEXITY: Moderate  PLAN:  PT FREQUENCY: 2x/week  PT DURATION: 6 weeks  PLANNED INTERVENTIONS: 97164- PT Re-evaluation, 97110-Therapeutic exercises, 97530- Therapeutic activity, 97112- Neuromuscular re-education, 97535- Self Care, [id number]- Manual therapy, [phone]- Gait training, [phone]- Orthotic Fit/training, [phone]- Aquatic Therapy, [phone]- Electrical stimulation (manual), Patient/Family education, Balance training, Stair training, Dry Needling, Joint mobilization, Spinal mobilization, Vestibular training, and DME instructions  PLAN FOR NEXT SESSION: Monitor vitals- pt very hypotensive. Work on return to hiking; NBOS, eyes closed, eccentric control, endurance, response to DN?   Karigan Cloninger, PT Lorita Rosa, PT, DPT, CSRS   11/05/2023, 9:16 AM
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
## 2023-11-08 ENCOUNTER — Encounter: Attending: Physical Medicine and Rehabilitation | Admitting: Physical Medicine and Rehabilitation

## 2023-11-08 ENCOUNTER — Encounter: Payer: Self-pay | Admitting: Physical Medicine and Rehabilitation

## 2023-11-08 VITALS — BP 125/74 | HR 58 | Ht 68.0 in | Wt 154.0 lb

## 2023-11-08 DIAGNOSIS — S14129D Central cord syndrome at unspecified level of cervical spinal cord, subsequent encounter: Secondary | ICD-10-CM | POA: Diagnosis not present

## 2023-11-08 DIAGNOSIS — S065XAA Traumatic subdural hemorrhage with loss of consciousness status unknown, initial encounter: Secondary | ICD-10-CM | POA: Insufficient documentation

## 2023-11-08 DIAGNOSIS — I2699 Other pulmonary embolism without acute cor pulmonale: Secondary | ICD-10-CM | POA: Insufficient documentation

## 2023-11-08 DIAGNOSIS — I951 Orthostatic hypotension: Secondary | ICD-10-CM | POA: Diagnosis not present

## 2023-11-08 DIAGNOSIS — G8252 Quadriplegia, C1-C4 incomplete: Secondary | ICD-10-CM | POA: Diagnosis not present

## 2023-11-08 MED ORDER — APIXABAN 5 MG PO TABS: 5.0000 mg | ORAL_TABLET | Freq: Two times a day (BID) | ORAL | 0 refills | Status: DC

## 2023-11-08 MED ORDER — QUETIAPINE FUMARATE 25 MG PO TABS: 25.0000 mg | ORAL_TABLET | Freq: Every day | ORAL | 5 refills | Status: DC

## 2023-11-08 MED ORDER — BACLOFEN 5 MG PO TABS: 5.0000 mg | ORAL_TABLET | Freq: Three times a day (TID) | ORAL | 5 refills | Status: DC | PRN

## 2023-11-08 MED ORDER — MIDODRINE HCL 10 MG PO TABS: 10.0000 mg | ORAL_TABLET | Freq: Three times a day (TID) | ORAL | 5 refills | Status: DC

## 2023-11-08 MED ORDER — GABAPENTIN 400 MG PO CAPS: 400.0000 mg | ORAL_CAPSULE | Freq: Three times a day (TID) | ORAL | 5 refills | Status: DC

## 2023-11-08 NOTE — Progress Notes (Signed)
 Subjective:    Patient ID: Brandon Watts, male    DOB: 1946/05/31, 78 y.o.   MRN: [id number]  HPI  Pt is a 78 yr old male with hx of Incomplete quadriplegia with Central cord syndrome ASIA D- with C4 fx s/p C3/4/5 fusion- neurogenic bladder- foley, LUL PE on Eliquis ; Chronic pain on MS Contin ; Severe orthostatic hypotension, B/L shoulder pain;     Doing OK  BP- running high, but dropping significantly still.  However less Symptomatic- was able to stop wearing abd binder-  Uncomfortable with it due to hypersensitivity in abdomen.   Still wearing ACE/TEDs-   Still on Midodrine  10 mg TID No florinef- Not particularly Symptomatic-  Can get on floor to pet dog and get back up and no worries.    Still has quite a bit of discomfort- bad day when doesn't take MS Contin - pain 2-3/10- however if reaches out LUEshoulder  Feels like wearing football pads- tight in shoulder girdles tight and burning- worse with shoulder movement- part of it is corset feeling- parasthesias-cold burning; and sharp MSK pain. Associated with movement.   Had a LOT more parasthesias  when reduced Gabapentin - so went back up.  Nerve pain is about the same as it was before  In pain all the time- 20-3/10.  Getting used to it.    Has a kpad- with cold waster.   Had a lot of hematuria- foley was in bad place- after Dr Felipe Horton changed foley- pain went away and hematuria cleared.  Had UDS on 5/6- d/w pt about SPC as compared to cathing for long periods.  Can do voiding trials.   Still doing outpt PT and OT 2x/week [address].  Tried dry needling- for shoulders- felt better for rest of day but not after that, so not fan of doing again.    Spasms not bad- baclofen  helped bladder spasms- takes at night-so not awakened- sleeps on back-  several times per day- doesn't take during day.    Walked down steep steps- 2 flights- no issues-  Up to 1 mile walk in woods. No AD   Pain Inventory Average Pain 3 Pain Right Now  2 My pain is constant, sharp, burning, tingling, and aching  In the last 24 hours, has pain interfered with the following? General activity 3 Relation with others 2 Enjoyment of life 3 What TIME of day is your pain at its worst? morning  and evening Sleep (in general) Fair  Pain is worse with: some activites Pain improves with: rest, heat/ice, and medication Relief from Meds: 3  how many minutes can you walk? 30 ability to climb steps?  yes do you drive?  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: date last employed 11/05/26 retired I need assistance with the following:  bathing and household duties  bladder control problems bowel control problems weakness numbness tingling spasms dizziness  Hospital f/u  Hospital f/u    Family History  Problem Relation Age of Onset   Hypertension Mother    Alcohol abuse Mother    Esophageal varices Mother    Depression Mother    Colon cancer Father    Hypertension Maternal Grandmother    CVA Maternal Grandmother    Heart disease Maternal Grandfather    Lung cancer Half-Sister    Social History   Socioeconomic History   Marital status: Married    Spouse name: Brandon Watts   Number of children: 2   Years of education: 42  Highest education level: Professional school degree (e.g., MD, DDS, DVM, JD)  Occupational History   Occupation: physician    Employer:   Tobacco Use   Smoking status: Former    Current packs/day: 0.00    Average packs/day: 2.0 packs/day for 26.0 years (52.0 ttl pk-yrs)    Types: Cigarettes    Start date: 22    Quit date: 1988    Years since quitting: 37.3   Smokeless tobacco: Never   Tobacco comments:    QUIT SMOKING 1981  Vaping Use   Vaping status: Never Used  Substance and Sexual Activity   Alcohol use: Yes    Alcohol/week: 1.0 standard drink of alcohol    Types: 1 Standard drinks or equivalent per week    Comment: 1&1/2 OZ DAILY   Drug use: Never   Sexual activity:  Yes  Other Topics Concern   Not on file  Social History Narrative   ** Merged History Encounter **       Social Drivers of Health   Financial Resource Strain: Low Risk  (10/25/2023)   Received from [facility]   Overall Financial Resource Strain (CARDIA)    Difficulty of Paying Living Expenses: Not hard at all  Food Insecurity: No Food Insecurity (10/25/2023)   Received from [facility]   Hunger Vital Sign    Worried About Running Out of Food in the Last Year: Never true    Ran Out of Food in the Last Year: Never true  Transportation Needs: No Transportation Needs (10/25/2023)   Received from [facility] - Transportation    Lack of Transportation (Medical): No    Lack of Transportation (Non-Medical): No  Physical Activity: Unknown (10/25/2023)   Received from [facility]   Exercise Vital Sign    Days of Exercise per Week: 0 days    Minutes of Exercise per Session: Not on file  Stress: Stress Concern Present (10/25/2023)   Received from [facility] - Occupational Stress Questionnaire    Feeling of Stress : To some extent  Social Connections: Socially Integrated (10/25/2023)   Received from [facility]   Social Network    How would you rate your social network (family, work, friends)?: Good participation with social networks   Past Surgical History:  Procedure Laterality Date   ANTERIOR CERVICAL DECOMP/DISCECTOMY FUSION N/A 09/09/2023   Procedure: ANTERIOR CERVICAL DECOMPRESSION/DISCECTOMY FUSION 2 LEVEL/HARDWARE REMOVAL - C 3-4 , C4-5;  Surgeon: Van Gelinas, MD;  Location: [facility] OR;  Service: Neurosurgery;  Laterality: N/A;   EYE SURGERY     LASIK EYE SURGERY   LITHOTRIPSY     x 3   LITHOTRIPSY     x2   TONSILLECTOMY     AS A CHILD   TONSILLECTOMY     TOTAL HIP ARTHROPLASTY Right 10/25/2012   Procedure: RIGHT TOTAL HIP ARTHROPLASTY ANTERIOR APPROACH;  Surgeon: Bevin Bucks, MD;  Location: [facility] ORS;   Service: Orthopedics;  Laterality: Right;   TOTAL HIP ARTHROPLASTY Right    TRIGGER FINGER RELEASE Right 2016   third digit right hand. Dr.Supple   URETERAL STONE EXTRACTION S X 2     VASCECTOMY     Past Medical History:  Diagnosis Date   Allergic rhinitis    Arthritis    PAIN AND OA RIGHT HIP   Arthritis    BPH (benign prostatic hyperplasia)    DDD (degenerative disc disease), lumbar  MILD-FOUND ON XRAY   Glaucoma    normotensive type with early changes   Glaucoma    Hepatitis    HEPATITIS C -CLEARED   Hepatitis    Hep C, treated and cleared   HLD (hyperlipidemia)    taking statin as preventative   Hypertension    Insomnia    CHRONIC   Insomnia    Positive PPD, treated    Pulmonary nodules    FOLLOWED BY DR. Armida Berry GATES WITH CT EXAMS-MOST RECENT 01/22/12   Status post trigger finger release    right   BP 125/74   Pulse (!) 58   Ht 5\' 8"  (1.727 m)   Wt 154 lb (69.9 kg)   SpO2 94%   BMI 23.42 kg/m   Opioid Risk Score:   Fall Risk Score:  `1  Depression screen [facility] 2/9     11/08/2023   11:30 AM 08/26/2022    8:25 AM 10/09/2021   11:36 AM 08/17/2021    8:54 AM  Depression screen PHQ 2/9  Decreased Interest 1 0 0 0  Down, Depressed, Hopeless 1 0 0 0  PHQ - 2 Score 2 0 0 0  Altered sleeping 0 0  0  Tired, decreased energy 3 0  0  Change in appetite 0 0  0  Feeling bad or failure about yourself  0 0  0  Trouble concentrating 3 0  0  Moving slowly or fidgety/restless 3 0  0  Suicidal thoughts 1 0  0  PHQ-9 Score 12 0  0  Difficult doing work/chores Very difficult Not difficult at all       Review of Systems  Musculoskeletal:  Positive for neck pain.      Objective:   Physical Exam  Awake, alert, appropriate, walking with no Assistive device, NAD Accompanied by his wife   RUE_ Deltoid- 3-/5; Biceps 5-/5; Triceps 5-/5; WE 5-/5; Grip 5-/5 and FA 5-/5 LUE- deltoid 2/5; Biceps 4-/5 and triceps 4-/5; WE 4/5; Grip 4-/5; FA 3+/5 RLE-  5-/5  throughout LLE- HF 4/5; KE 4/5; DF 4+/5 and PF 4+/5   Neuro:  Hoffman's- on LUE but not on RUE No clonus B/L No increased tone      Assessment & Plan:   Pt is a 78 yr old male with hx of Incomplete quadriplegia with Central cord syndrome ASIA D- with C4 fx s/p C3/4/5 fusion- neurogenic bladder- foley, LUL PE on Eliquis ; Chronic pain on MS Contin ; Severe orthostatic hypotension, B/L shoulder pain;     Will wait to reduce Midodrine  at this point until next visit- -if BP runs 160s-170's- regularly-/all the time, will need to reduce Midodrine  earlier.  Can try to stop ACE wraps first- when doesn't have busy days- maybe next week- and then TEDs don't come off of until reduce Midodrine .   2.   Will have PCP continue MS Contin  Dr Donnette Gal- 15 mg BID See no reason to stop it- for at least 6months- no concerns about use right now   3. Con't gabapentin  400 mg TID- will con't- won't decrease dose   4. Agree with Acupuncture- has someone to see in [facility].    5. I'm fine with a trip-  to [facility]-  Has to do 6-7 miles hike- I think he should be able to do it!   6. Con't Midodrine , refilled   7. Con't Seroquel  25 mg at bedtime- refill   8. Con't gabapentin  400 mg TID- refills  9. Cont  baclofen  as needed- 5 mg TID prn  10. Con't Eliquis  for 1 more month- for PE  11. Once done with Eliquis , you can go back to Celebrex !  12. F?U in 3months- double appt- SCI   I spent a total of 43   minutes on total care today- >50% coordination of care- due to d/w pt about his SCI- and detailed as above.

## 2023-11-08 NOTE — Patient Instructions (Signed)
 Pt is a 78 yr old male with hx of Incomplete quadriplegia with Central cord syndrome ASIA D- with C4 fx s/p C3/4/5 fusion- neurogenic bladder- foley, LUL PE on Eliquis ; Chronic pain on MS Contin ; Severe orthostatic hypotension, B/L shoulder pain;     Will wait to reduce Midodrine  at this point until next visit- -if BP runs 160s-170's- regularly-/all the time, will need to reduce Midodrine  earlier.  Can try to stop ACE wraps first- when doesn't have busy days- maybe next week- and then TEDs don't come off of until reduce Midodrine .   2.   Will have PCP continue MS Contin  Dr Donnette Gal- 15 mg BID See no reason to stop it- for at least 6months- no concerns about use right now   3. Con't gabapentin  400 mg TID- will con't- won't decrease dose   4. Agree with Acupuncture- has someone to see in [facility].    5. I'm fine with a trip-  to [facility]-  Has to do 6-7 miles hike- I think he should be able to do it!   6. Con't Midodrine , refilled   7. Con't Seroquel  25 mg at bedtime- refill   8. Con't gabapentin  400 mg TID- refills  9. Cont baclofen  as needed- 5 mg TID prn  10. Con't Eliquis  for 1 more month-   11. Once done with Eliquis , you can go back to Celebrex !  12. F?U in 3months- double appt- SCI

## 2023-11-09 ENCOUNTER — Ambulatory Visit: Admitting: Occupational Therapy

## 2023-11-09 ENCOUNTER — Ambulatory Visit: Admitting: Physical Therapy

## 2023-11-09 VITALS — BP 108/57 | HR 68

## 2023-11-09 DIAGNOSIS — R278 Other lack of coordination: Secondary | ICD-10-CM

## 2023-11-09 DIAGNOSIS — R29818 Other symptoms and signs involving the nervous system: Secondary | ICD-10-CM

## 2023-11-09 DIAGNOSIS — R2681 Unsteadiness on feet: Secondary | ICD-10-CM | POA: Diagnosis not present

## 2023-11-09 DIAGNOSIS — M6281 Muscle weakness (generalized): Secondary | ICD-10-CM | POA: Diagnosis not present

## 2023-11-09 DIAGNOSIS — G8252 Quadriplegia, C1-C4 incomplete: Secondary | ICD-10-CM

## 2023-11-09 DIAGNOSIS — R2689 Other abnormalities of gait and mobility: Secondary | ICD-10-CM

## 2023-11-09 DIAGNOSIS — R208 Other disturbances of skin sensation: Secondary | ICD-10-CM

## 2023-11-09 DIAGNOSIS — R29898 Other symptoms and signs involving the musculoskeletal system: Secondary | ICD-10-CM

## 2023-11-09 DIAGNOSIS — M25512 Pain in left shoulder: Secondary | ICD-10-CM | POA: Diagnosis not present

## 2023-11-09 NOTE — Therapy (Signed)
 OUTPATIENT PHYSICAL THERAPY NEURO TREATMENT   Patient Name: Brandon Watts MRN: [id number] DOB:11/21/1945, 78 y.o., male Today's Date: 11/09/2023   PCP: Colene Dauphin, MD REFERRING PROVIDER: Sterling Eisenmenger, PA-C  END OF SESSION:  PT End of Session - 11/09/23 1021     Visit Number 8    Number of Visits 13    Date for PT Re-Evaluation 12/07/23    Authorization Type [employer] Medicare    PT Start Time 1020    PT Stop Time 1101    PT Time Calculation (min) 41 min    Equipment Utilized During Treatment Cervical collar   TED hose and ACE wraps   Activity Tolerance Patient tolerated treatment well    Behavior During Therapy WFL for tasks assessed/performed                 Past Medical History:  Diagnosis Date   Allergic rhinitis    Arthritis    PAIN AND OA RIGHT HIP   Arthritis    BPH (benign prostatic hyperplasia)    DDD (degenerative disc disease), lumbar    MILD-FOUND ON XRAY   Glaucoma    normotensive type with early changes   Glaucoma    Hepatitis    HEPATITIS C -CLEARED   Hepatitis    Hep C, treated and cleared   HLD (hyperlipidemia)    taking statin as preventative   Hypertension    Insomnia    CHRONIC   Insomnia    Positive PPD, treated    Pulmonary nodules    FOLLOWED BY DR. Armida Berry GATES WITH CT EXAMS-MOST RECENT 01/22/12   Status post trigger finger release    right   Past Surgical History:  Procedure Laterality Date   ANTERIOR CERVICAL DECOMP/DISCECTOMY FUSION N/A 09/09/2023   Procedure: ANTERIOR CERVICAL DECOMPRESSION/DISCECTOMY FUSION 2 LEVEL/HARDWARE REMOVAL - C 3-4 , C4-5;  Surgeon: Van Gelinas, MD;  Location: [facility];  Service: Neurosurgery;  Laterality: N/A;   EYE SURGERY     LASIK EYE SURGERY   LITHOTRIPSY     x 3   LITHOTRIPSY     x2   TONSILLECTOMY     AS A CHILD   TONSILLECTOMY     TOTAL HIP ARTHROPLASTY Right 10/25/2012   Procedure: RIGHT TOTAL HIP ARTHROPLASTY ANTERIOR APPROACH;  Surgeon: Bevin Bucks, MD;   Location: [facility];  Service: Orthopedics;  Laterality: Right;   TOTAL HIP ARTHROPLASTY Right    TRIGGER FINGER RELEASE Right 2016   third digit right hand. Dr.Supple   URETERAL STONE EXTRACTION S X 2     VASCECTOMY     Patient Active Problem List   Diagnosis Date Noted   Hypotension 10/22/2023   Gross hematuria 10/22/2023   Constipation 10/22/2023   Quadriplegia, C1-C4, incomplete (HCC) 09/18/2023   C4 cervical fracture (HCC) 09/17/2023   Central cord syndrome (HCC) 09/17/2023   Dysphagia 09/17/2023   Respiratory failure with hypoxia (HCC) 09/17/2023   SDH (subdural hematoma) (HCC) 09/17/2023   Malnutrition of moderate degree 09/15/2023   Acute pulmonary embolism without acute cor pulmonale (HCC) 09/15/2023   Post-nasal drainage 09/15/2023   Pneumonia of left lower lobe due to infectious organism 09/15/2023   Hyperlipidemia 09/08/2023   Diastolic dysfunction without heart failure 09/17/2022   Episodic lightheadedness 08/26/2022   Vasovagal near syncope 08/26/2022   Hypercalcemia 08/25/2022   Hyperglycemia 08/25/2022   Acute left-sided low back pain with left-sided sciatica 10/13/2021   Left renal mass 10/12/2021   Performance  anxiety 08/17/2021   Glaucoma 08/14/2021   Insomnia 08/14/2021   Hyperlipoproteinemia 08/14/2021   BPH (benign prostatic hyperplasia) 08/14/2021   Erectile dysfunction 08/14/2021   Trigger finger, acquired 07/11/2013   S/P right THA, AA 10/25/2012   BACK PAIN, LUMBAR 01/22/2010   NONSPEC REACT TUBERCULIN SKN TEST W/O ACTV TB 06/13/2009   HEPATITIS C 02/12/2007   ALLERGIC RHINITIS 02/12/2007   History of colonic polyps 02/12/2007    ONSET DATE: 10/08/2023 (referral)   REFERRING DIAG: G82.52 (ICD-10-CM) - Quadriplegia, C1-C4 incomplete S12.300A (ICD-10-CM) - Unspecified displaced fracture of fourth cervical vertebra, initial encounter for closed fracture S14.129A (ICD-10-CM) - Central cord syndrome at unspecified level of cervical spinal cord, initial  encounter  THERAPY DIAG:  Muscle weakness (generalized)  Quadriplegia, C1-C4, incomplete (HCC)  Unsteadiness on feet  Other abnormalities of gait and mobility  Rationale for Evaluation and Treatment: Rehabilitation  SUBJECTIVE:                                                                                                                                                                                             SUBJECTIVE STATEMENT:  Pt presents without AD. States he feels "like a drunk man". Having more pain today. Did not feel much pain relief w/TPDN last week. Has not fallen "yet". Plans on seeing an acupuncture specialist for pain.   Pt accompanied by:  Self   PERTINENT HISTORY: HTN, HLD, BPH, Glaucoma, Hx of hep C s/p treatment  PAIN:  Are you having pain? Yes: NPRS scale: 3-4/10  Pain location: Bilateral shoulder girdles L > R  Pain description: Achy  Aggravating factors: Moving arm Relieving factors: Pain meds  PRECAUTIONS: Cervical, Fall, and Other: No lifting over 5#, foley catheter  RED FLAGS: None   WEIGHT BEARING RESTRICTIONS: No  FALLS: Has patient fallen in last 6 months? No  LIVING ENVIRONMENT: Lives with: lives with their spouse Lives in: House/apartment Stairs: Yes: Internal: 16 steps; on right going up, on left going up, and can reach both and External: 3 + 1 steps; on right going up, on left going up, and can reach both Has following equipment at home: Otho Blitz - 4 wheeled, Shower bench, and bed side commode  PLOF: Independent  PATIENT GOALS: "To be able to hike in the [place] in August and in [place] in 07/2024. I would like to be able to play the cello again and walk my dog along the trails in about a month"   OBJECTIVE:  Note: Objective measures were completed at Evaluation unless otherwise noted.  DIAGNOSTIC FINDINGS: MRI of C-spine from 09/08/2023  IMPRESSION: 1. Signal abnormality within the  ventral spinal cord at the C3-C4 level  likely reflecting cord contusion. 2. Edema at site of a known acute fracture within the C4 spinous process. 3. Ligamentum flavum irregularity at C4-C5 consistent with ligamentum flavum injury, and possible ligamentum flavum disruption. 4. Edema signal within the interspinous spaces at C3-C4, C4-C5, C5-C6, C6-C7 and C7-T1 consistent with interspinous ligament injury. 5. Thinned appearance of the anterior longitudinal ligament at the C3 level suspicious for anterior longitudinal ligament injury (without complete ligamentous disruption). 6. Prevertebral edema/hematoma spanning the C2-C6 levels. 7. Cervical spondylosis as outlined within the body of the report. Moderate spinal canal stenosis at C3-C4 and C4-C5. Multilevel foraminal stenosis, greatest bilaterally at C3-C4 (moderate/severe) and on the left at C4-C5 (moderate). 8. Vertebral body and left-sided facet ankylosis at C2-C3.  COGNITION: Overall cognitive status: Within functional limits for tasks assessed   SENSATION: Stocking numbness in BLEs   POSTURE: rounded shoulders and pt wearing cervical collar  LOWER EXTREMITY ROM:     Active  Right Eval Left Eval  Hip flexion    Hip extension    Hip abduction    Hip adduction    Hip internal rotation    Hip external rotation    Knee flexion    Knee extension    Ankle dorsiflexion    Ankle plantarflexion    Ankle inversion    Ankle eversion     (Blank rows = not tested)  LOWER EXTREMITY MMT:  Tested in seated position   MMT Right Eval Left Eval  Hip flexion 4- 4-  Hip extension    Hip abduction 5 5  Hip adduction 5 5  Hip internal rotation    Hip external rotation    Knee flexion 5 5  Knee extension 5 5  Ankle dorsiflexion 5 5  Ankle plantarflexion    Ankle inversion    Ankle eversion    (Blank rows = not tested)  BED MOBILITY:  Pt reports he needs more support to get out of his bed at home due to having to get out on the L side (was getting out on the R  side in the hospital) and has difficulty propping up on L shoulder due to pain  TRANSFERS: Assistive device utilized: Environmental consultant - 4 wheeled  Sit to stand: SBA Stand to sit: SBA Chair to chair: SBA Noted decreased anterior weight shift initially w/minor lateral shift to L side. Poor eccentric control w/fatigue   STAIRS: Level of Assistance: SBA Stair Negotiation Technique: Alternating Pattern  with Bilateral Rails Number of Stairs: 4  Height of Stairs: 6"  Comments: Decreased eccentric control on RLE   GAIT: Gait pattern: step through pattern, lateral hip instability, trunk flexed, and wide BOS Distance walked: Various clinic distances  Assistive device utilized: Environmental consultant - 4 wheeled and None Level of assistance: Modified independence and SBA Comments: Pt is mod I w/rollator and SBA w/no AD due to anterior instability and decreased visual scanning ability   VITALS: Assessed in RUE in seated and standing position  Vitals:   11/09/23 1032 11/09/23 1033 11/09/23 1056 11/09/23 1058  BP: 136/79 102/66 133/80 (!) 108/57  Pulse: (!) 55 69 (!) 57 68  TREATMENT :   Self-care/home management  Assessed vitals (see above) in seated and then standing position. Pt continues to drop >30 points systolic and >10 points diastolic, but BP overall in more stable range. Discussed returning to gym, but pt not to return until he can drive and he will not be cleared until his cervical precautions are uplifted. Informed pt that therapist can design him an exercise program once he is cleared to return.  Informed pt that therapist does not think he will be able to safely hike the [facility] in August. Pt in agreement with this but will talk to wife about it.   NMR  The following treadmill training was completed for aerobic/neural priming, endurance, and gait speed.  - Warmup: 3:00 up to 1.6 mph w/BUE support. Min  cues for upright posture  - HIIT: 1:00 30sec ON/OFF alternating large blue theraball kicks and normal gait at 1.6 mph. Pt tolerated well but kicking caused his foley bag to detach from his leg, so discontinued activity.   Ther Act  Standing finger ladder x4 reps w/LLE while providing anterior shoulder block to reduce IR, adduction and shoulder shrug. Pt reported feeling good stretch in entirety of LUE w/this.  Standing wall slides w/washcloth, x10 reps per side. Cued pt to maintain 90 degrees of elbow flexion and perform scapular protraction to perform. Cued pt to not raise arm above eye level, which pt able to do well. Pt only able to perform 8 reps on LUE due to fatigue and demonstrated strong scapular protraction on first two reps but then regressed to shoulder shrug due to fatigue.     Assessed vitals at end of session (see above) and pt continues to be orthostatic, increased from beginning of session.    PATIENT EDUCATION: Education details: Continue HEP, see self-care  Person educated: Patient Education method: Explanation, Demonstration, and Verbal cues Education comprehension: verbalized understanding, returned demonstration, verbal cues required, and needs further education  HOME EXERCISE PROGRAM: From CIRDeronda Flesher; [id number]  Desensitization Techniques: -Light touch/pressure:  using fingertip pressure to slowly move up small segments of the affected body part until outside the area of pain and then back to where you started -Deep pressure:  using fingertips to squeeze in small segments starting outside the affected area, crossing over the affected area, and then to the other side of the affected area -Tapping:  fingertips tap with firm pressure over the affected area, can move around the affected area as well -Brushing/scratching:  use fingertips to brush/scratch over and around the affected area -Textures:  use soft and rough textured items like cotton balls vs wash cloths to  brush or rub with light into firm pressure over and around the affected area  Repeat these 3-4x per day.   GOALS: Goals reviewed with patient? Yes  SHORT TERM GOALS: Target date: 11/09/2023   Pt will be independent with initial HEP for improved strength, balance, transfers and gait.  Baseline: Goal status: INITIAL  2.  MCTSIB to be assessed and STG/LTG updated ;  hold condition 4 on mCTSIB with minimal to no postural sway for improved balance. (SD) Baseline: 30 sec hold with moderate postural sway Goal status: INITIAL  3.  to be assessed and LTG updated  Baseline: 1282.10' Goal status: Goal met 10-18-23  4.  Modified FGA to be assessed and LTG updated  Baseline: 22/30 Goal status: MET  LONG TERM GOALS: Target date: 11/23/2023   Patient will score >/=25/30 (accounting for not being able to  complete head turns) on FGA to demonstrate improved walking balance Baseline: 22/30 Goal status: REVISED  2.  goal - increase distance to >1400' for increased endurance/gait efficiency. (SD) Baseline:  Goal status: INITIAL  3.  MCTSIB goal -  hold condition 4 on mCTSIB with minimal to no postural sway for improved balance. (SD) Baseline:  Goal status: INITIAL  4.  Pt will return to walking dog on trail w/SBA and no instability for return to PLOF and improved independence  Baseline:  Goal status: INITIAL   ASSESSMENT:  CLINICAL IMPRESSION: Emphasis of skilled PT session on monitoring BP, high intensity gait training and L shoulder ROM. Pt continues to be limited by symptomatic orthostatics, but lowest BP levels are within more acceptable range. Attempted treadmill training, but the high amplitude movement caused pt's catheter to detach from his leg, so discontinued movement. Pt continues to rely on shoulder shrug compensation to raise L shoulder, so worked on scapular protraction/retraction and elevation, which was difficult for pt.  Continue POC.    OBJECTIVE IMPAIRMENTS:  Abnormal gait, decreased activity tolerance, decreased balance, decreased coordination, decreased endurance, decreased mobility, decreased strength, dizziness, impaired sensation, impaired UE functional use, improper body mechanics, and pain  ACTIVITY LIMITATIONS: carrying, lifting, bending, squatting, transfers, bed mobility, toileting, dressing, reach over head, hygiene/grooming, locomotion level, and caring for others  PARTICIPATION LIMITATIONS: meal prep, cleaning, laundry, medication management, driving, shopping, community activity, occupation, and yard work  PERSONAL FACTORS: Fitness, Past/current experiences, and 1 comorbidity: quadriplegia  are also affecting patient's functional outcome.   REHAB POTENTIAL: Excellent  CLINICAL DECISION MAKING: Evolving/moderate complexity  EVALUATION COMPLEXITY: Moderate  PLAN:  PT FREQUENCY: 2x/week  PT DURATION: 6 weeks  PLANNED INTERVENTIONS: 97164- PT Re-evaluation, 97110-Therapeutic exercises, 97530- Therapeutic activity, 97112- Neuromuscular re-education, 97535- Self Care, 16109- Manual therapy, [phone]- Gait training, [phone]- Orthotic Fit/training, [phone]- Aquatic Therapy, [phone]- Electrical stimulation (manual), Patient/Family education, Balance training, Stair training, Dry Needling, Joint mobilization, Spinal mobilization, Vestibular training, and DME instructions  PLAN FOR NEXT SESSION: Monitor vitals- pt very hypotensive. Work on return to hiking; NBOS, eyes closed, eccentric control, endurance, response to DN? L shoulder periscapular strength    Niema Carrara E Kerensa Nicklas, PT, DPT   11/09/2023, 11:02 AM
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
## 2023-11-11 ENCOUNTER — Encounter: Admitting: Occupational Therapy

## 2023-11-11 ENCOUNTER — Ambulatory Visit: Admitting: Physical Therapy

## 2023-11-15 ENCOUNTER — Ambulatory Visit: Admitting: Occupational Therapy

## 2023-11-15 ENCOUNTER — Other Ambulatory Visit: Payer: Self-pay | Admitting: Internal Medicine

## 2023-11-15 ENCOUNTER — Ambulatory Visit: Attending: Physician Assistant | Admitting: Physical Therapy

## 2023-11-15 VITALS — BP 93/65 | HR 66

## 2023-11-15 DIAGNOSIS — R29818 Other symptoms and signs involving the nervous system: Secondary | ICD-10-CM | POA: Insufficient documentation

## 2023-11-15 DIAGNOSIS — G8929 Other chronic pain: Secondary | ICD-10-CM | POA: Diagnosis not present

## 2023-11-15 DIAGNOSIS — R2689 Other abnormalities of gait and mobility: Secondary | ICD-10-CM | POA: Insufficient documentation

## 2023-11-15 DIAGNOSIS — R208 Other disturbances of skin sensation: Secondary | ICD-10-CM | POA: Diagnosis not present

## 2023-11-15 DIAGNOSIS — M6281 Muscle weakness (generalized): Secondary | ICD-10-CM | POA: Insufficient documentation

## 2023-11-15 DIAGNOSIS — R2681 Unsteadiness on feet: Secondary | ICD-10-CM | POA: Insufficient documentation

## 2023-11-15 DIAGNOSIS — G8252 Quadriplegia, C1-C4 incomplete: Secondary | ICD-10-CM | POA: Insufficient documentation

## 2023-11-15 DIAGNOSIS — M25512 Pain in left shoulder: Secondary | ICD-10-CM

## 2023-11-15 DIAGNOSIS — R29898 Other symptoms and signs involving the musculoskeletal system: Secondary | ICD-10-CM | POA: Insufficient documentation

## 2023-11-15 DIAGNOSIS — R278 Other lack of coordination: Secondary | ICD-10-CM

## 2023-11-15 DIAGNOSIS — M542 Cervicalgia: Secondary | ICD-10-CM | POA: Diagnosis not present

## 2023-11-15 DIAGNOSIS — M25511 Pain in right shoulder: Secondary | ICD-10-CM | POA: Insufficient documentation

## 2023-11-15 NOTE — Therapy (Signed)
 OUTPATIENT PHYSICAL THERAPY NEURO TREATMENT   Patient Name: Brandon Watts MRN: [id number] DOB:October 15, 1945, 78 y.o., male Today's Date: 11/15/2023   PCP: Colene Dauphin, MD REFERRING PROVIDER: Sterling Eisenmenger, PA-C  END OF SESSION:  PT End of Session - 11/15/23 0934     Visit Number 9    Number of Visits 13    Date for PT Re-Evaluation 12/07/23    Authorization Type [employer] Medicare    PT Start Time 0932   pt arrived late   PT Stop Time 1015    PT Time Calculation (min) 43 min    Equipment Utilized During Treatment Cervical collar   TED hose and ACE wraps   Activity Tolerance Patient limited by pain    Behavior During Therapy WFL for tasks assessed/performed                  Past Medical History:  Diagnosis Date   Allergic rhinitis    Arthritis    PAIN AND OA RIGHT HIP   Arthritis    BPH (benign prostatic hyperplasia)    DDD (degenerative disc disease), lumbar    MILD-FOUND ON XRAY   Glaucoma    normotensive type with early changes   Glaucoma    Hepatitis    HEPATITIS C -CLEARED   Hepatitis    Hep C, treated and cleared   HLD (hyperlipidemia)    taking statin as preventative   Hypertension    Insomnia    CHRONIC   Insomnia    Positive PPD, treated    Pulmonary nodules    FOLLOWED BY DR. Armida Berry GATES WITH CT EXAMS-MOST RECENT 01/22/12   Status post trigger finger release    right   Past Surgical History:  Procedure Laterality Date   ANTERIOR CERVICAL DECOMP/DISCECTOMY FUSION N/A 09/09/2023   Procedure: ANTERIOR CERVICAL DECOMPRESSION/DISCECTOMY FUSION 2 LEVEL/HARDWARE REMOVAL - C 3-4 , C4-5;  Surgeon: Van Gelinas, MD;  Location: [facility];  Service: Neurosurgery;  Laterality: N/A;   EYE SURGERY     LASIK EYE SURGERY   LITHOTRIPSY     x 3   LITHOTRIPSY     x2   TONSILLECTOMY     AS A CHILD   TONSILLECTOMY     TOTAL HIP ARTHROPLASTY Right 10/25/2012   Procedure: RIGHT TOTAL HIP ARTHROPLASTY ANTERIOR APPROACH;  Surgeon: Bevin Bucks, MD;   Location: [facility];  Service: Orthopedics;  Laterality: Right;   TOTAL HIP ARTHROPLASTY Right    TRIGGER FINGER RELEASE Right 2016   third digit right hand. Dr.Supple   URETERAL STONE EXTRACTION S X 2     VASCECTOMY     Patient Active Problem List   Diagnosis Date Noted   Hypotension 10/22/2023   Gross hematuria 10/22/2023   Constipation 10/22/2023   Quadriplegia, C1-C4, incomplete (HCC) 09/18/2023   C4 cervical fracture (HCC) 09/17/2023   Central cord syndrome (HCC) 09/17/2023   Dysphagia 09/17/2023   Respiratory failure with hypoxia (HCC) 09/17/2023   SDH (subdural hematoma) (HCC) 09/17/2023   Malnutrition of moderate degree 09/15/2023   Acute pulmonary embolism without acute cor pulmonale (HCC) 09/15/2023   Post-nasal drainage 09/15/2023   Pneumonia of left lower lobe due to infectious organism 09/15/2023   Hyperlipidemia 09/08/2023   Diastolic dysfunction without heart failure 09/17/2022   Episodic lightheadedness 08/26/2022   Vasovagal near syncope 08/26/2022   Hypercalcemia 08/25/2022   Hyperglycemia 08/25/2022   Acute left-sided low back pain with left-sided sciatica 10/13/2021   Left renal  mass 10/12/2021   Performance anxiety 08/17/2021   Glaucoma 08/14/2021   Insomnia 08/14/2021   Hyperlipoproteinemia 08/14/2021   BPH (benign prostatic hyperplasia) 08/14/2021   Erectile dysfunction 08/14/2021   Trigger finger, acquired 07/11/2013   S/P right THA, AA 10/25/2012   BACK PAIN, LUMBAR 01/22/2010   NONSPEC REACT TUBERCULIN SKN TEST W/O ACTV TB 06/13/2009   HEPATITIS C 02/12/2007   ALLERGIC RHINITIS 02/12/2007   History of colonic polyps 02/12/2007    ONSET DATE: 10/08/2023 (referral)   REFERRING DIAG: G82.52 (ICD-10-CM) - Quadriplegia, C1-C4 incomplete S12.300A (ICD-10-CM) - Unspecified displaced fracture of fourth cervical vertebra, initial encounter for closed fracture S14.129A (ICD-10-CM) - Central cord syndrome at unspecified level of cervical spinal cord, initial  encounter  THERAPY DIAG:  Muscle weakness (generalized)  Other symptoms and signs involving the nervous system  Other symptoms and signs involving the musculoskeletal system  Unsteadiness on feet  Other abnormalities of gait and mobility  Rationale for Evaluation and Treatment: Rehabilitation  SUBJECTIVE:                                                                                                                                                                                             SUBJECTIVE STATEMENT:  Pt has been having a lot more pain over the past few days, but may be due to being more active and being more fatigued?  He had family visiting, did lots of walking around to musuems, went to the ballet and to concerts, has not had time to take his daily nap, etc.  Pt continues to have pain in L shoulder girdle region. Pt also having more pain in R shoulder girdle as well today.  Pt had relief for a few hours after DN but then pain returned, very short term relief. Pt is going to look into acupuncture.  Pt accompanied by:  Self   PERTINENT HISTORY: HTN, HLD, BPH, Glaucoma, Hx of hep C s/p treatment  PAIN:  Are you having pain? Yes: NPRS scale: "gasp" 6/10  Pain location: Bilateral shoulder girdles L > R  Pain description: Achy  Aggravating factors: Moving arm Relieving factors: Pain meds  PRECAUTIONS: Cervical, Fall, and Other: No lifting over 5#, foley catheter  RED FLAGS: None   WEIGHT BEARING RESTRICTIONS: No  FALLS: Has patient fallen in last 6 months? No  LIVING ENVIRONMENT: Lives with: lives with their spouse Lives in: House/apartment Stairs: Yes: Internal: 16 steps; on right going up, on left going up, and can reach both and External: 3 + 1 steps; on right going up, on left going up, and can reach both Has following equipment  at home: Otho Blitz - 4 wheeled, Shower bench, and bed side commode  PLOF: Independent  PATIENT GOALS: "To be able to hike in  the [place] in August and in [place] in 07/2024. I would like to be able to play the cello again and walk my dog along the trails in about a month"   OBJECTIVE:  Note: Objective measures were completed at Evaluation unless otherwise noted.  DIAGNOSTIC FINDINGS: MRI of C-spine from 09/08/2023  IMPRESSION: 1. Signal abnormality within the ventral spinal cord at the C3-C4 level likely reflecting cord contusion. 2. Edema at site of a known acute fracture within the C4 spinous process. 3. Ligamentum flavum irregularity at C4-C5 consistent with ligamentum flavum injury, and possible ligamentum flavum disruption. 4. Edema signal within the interspinous spaces at C3-C4, C4-C5, C5-C6, C6-C7 and C7-T1 consistent with interspinous ligament injury. 5. Thinned appearance of the anterior longitudinal ligament at the C3 level suspicious for anterior longitudinal ligament injury (without complete ligamentous disruption). 6. Prevertebral edema/hematoma spanning the C2-C6 levels. 7. Cervical spondylosis as outlined within the body of the report. Moderate spinal canal stenosis at C3-C4 and C4-C5. Multilevel foraminal stenosis, greatest bilaterally at C3-C4 (moderate/severe) and on the left at C4-C5 (moderate). 8. Vertebral body and left-sided facet ankylosis at C2-C3.  COGNITION: Overall cognitive status: Within functional limits for tasks assessed   SENSATION: Stocking numbness in BLEs   POSTURE: rounded shoulders and pt wearing cervical collar  LOWER EXTREMITY ROM:     Active  Right Eval Left Eval  Hip flexion    Hip extension    Hip abduction    Hip adduction    Hip internal rotation    Hip external rotation    Knee flexion    Knee extension    Ankle dorsiflexion    Ankle plantarflexion    Ankle inversion    Ankle eversion     (Blank rows = not tested)  LOWER EXTREMITY MMT:  Tested in seated position   MMT Right Eval Left Eval  Hip flexion 4- 4-  Hip extension    Hip  abduction 5 5  Hip adduction 5 5  Hip internal rotation    Hip external rotation    Knee flexion 5 5  Knee extension 5 5  Ankle dorsiflexion 5 5  Ankle plantarflexion    Ankle inversion    Ankle eversion    (Blank rows = not tested)  BED MOBILITY:  Pt reports he needs more support to get out of his bed at home due to having to get out on the L side (was getting out on the R side in the hospital) and has difficulty propping up on L shoulder due to pain  TRANSFERS: Assistive device utilized: Environmental consultant - 4 wheeled  Sit to stand: SBA Stand to sit: SBA Chair to chair: SBA Noted decreased anterior weight shift initially w/minor lateral shift to L side. Poor eccentric control w/fatigue   STAIRS: Level of Assistance: SBA Stair Negotiation Technique: Alternating Pattern  with Bilateral Rails Number of Stairs: 4  Height of Stairs: 6"  Comments: Decreased eccentric control on RLE   GAIT: Gait pattern: step through pattern, lateral hip instability, trunk flexed, and wide BOS Distance walked: Various clinic distances  Assistive device utilized: Environmental consultant - 4 wheeled and None Level of assistance: Modified independence and SBA Comments: Pt is mod I w/rollator and SBA w/no AD due to anterior instability and decreased visual scanning ability   VITALS: Assessed in RUE in seated and standing position  Vitals:   11/15/23 0943 11/15/23 0944  BP: 113/65 93/65  Pulse:  66                                                                                                TREATMENT :   Self-care/home management  Assessed vitals (see above) in seated and then standing position. Pt drops 20 points systolic and does not drop in diastolic, BP overall in more stable range. Pt does report mild lightheadedness in standing, pt wearing TED hose and BLE ACE wraps this date.   Ther Act  For STG assessment: Condition 4 mCTSIB: 30 sec  TherEx Supine serratus punch x 10 reps Supine L shoulder perturbations 3 x  30 sec Increased pain in long head of L biceps and L ant delt   Manual Therapy STM and MFR to L ant delt/biceps to address trigger points and increased tightness in tendons. Reviewed how to stretch area with L forearm hanging off edge of bed.   PATIENT EDUCATION: Education details: Continue HEP, verbally added to HEP Person educated: Patient Education method: Programmer, multimedia, Demonstration, and Verbal cues Education comprehension: verbalized understanding, returned demonstration, verbal cues required, and needs further education  HOME EXERCISE PROGRAM: From CIRDeronda Flesher; [id number]  Desensitization Techniques: -Light touch/pressure:  using fingertip pressure to slowly move up small segments of the affected body part until outside the area of pain and then back to where you started -Deep pressure:  using fingertips to squeeze in small segments starting outside the affected area, crossing over the affected area, and then to the other side of the affected area -Tapping:  fingertips tap with firm pressure over the affected area, can move around the affected area as well -Brushing/scratching:  use fingertips to brush/scratch over and around the affected area -Textures:  use soft and rough textured items like cotton balls vs wash cloths to brush or rub with light into firm pressure over and around the affected area  Repeat these 3-4x per day.   Verbally added 11/15/23: Supine serratus punch 3 x 10 reps Supine L biceps and ant delt stretch off EOB 3 x 30 sec  GOALS: Goals reviewed with patient? Yes  SHORT TERM GOALS: Target date: 11/09/2023   Pt will be independent with initial HEP for improved strength, balance, transfers and gait. Baseline: Goal status: MET  2.  MCTSIB to be assessed and STG/LTG updated ;  hold condition 4 on mCTSIB with minimal to no postural sway for improved balance. (SD) Baseline: 30 sec hold with moderate postural sway, 30 sec with mild postural sway (5/5) Goal  status: MET  3.  to be assessed and LTG updated  Baseline: 1282.10' Goal status: Goal met 10-18-23  4.  Modified FGA to be assessed and LTG updated  Baseline: 22/30 Goal status: MET  LONG TERM GOALS: Target date: 11/23/2023   Patient will score >/=25/30 (accounting for not being able to complete head turns) on FGA to demonstrate improved walking balance Baseline: 22/30 Goal status: REVISED  2.  goal - increase distance to >1400' for increased endurance/gait efficiency. (SD) Baseline:  Goal  status: INITIAL  3.  MCTSIB goal -  hold condition 4 on mCTSIB with minimal to no postural sway for improved balance. (SD) Baseline:  Goal status: INITIAL  4.  Pt will return to walking dog on trail w/SBA and no instability for return to PLOF and improved independence  Baseline:  Goal status: INITIAL   ASSESSMENT:  CLINICAL IMPRESSION: Emphasis of skilled PT session on assessing remaining STG, continuing to monitor BP, and working on stretching of L shoulder girdle and manual therapy to address ongoing pain and muscle tightness. Pt has met 4/4 STG with 2/2 being met this date as pt is independent with his initial HEP and exhibiting only minimal sway on Condition 4 of the mCTSIB. He does continue to exhibit impaired function of his L shoulder girdle > R shoulder girdle with ongoing pain, muscle tightness, and muscle weakness. Continue POC.    OBJECTIVE IMPAIRMENTS: Abnormal gait, decreased activity tolerance, decreased balance, decreased coordination, decreased endurance, decreased mobility, decreased strength, dizziness, impaired sensation, impaired UE functional use, improper body mechanics, and pain  ACTIVITY LIMITATIONS: carrying, lifting, bending, squatting, transfers, bed mobility, toileting, dressing, reach over head, hygiene/grooming, locomotion level, and caring for others  PARTICIPATION LIMITATIONS: meal prep, cleaning, laundry, medication management, driving, shopping,  community activity, occupation, and yard work  PERSONAL FACTORS: Fitness, Past/current experiences, and 1 comorbidity: quadriplegia  are also affecting patient's functional outcome.   REHAB POTENTIAL: Excellent  CLINICAL DECISION MAKING: Evolving/moderate complexity  EVALUATION COMPLEXITY: Moderate  PLAN:  PT FREQUENCY: 2x/week  PT DURATION: 6 weeks  PLANNED INTERVENTIONS: 97164- PT Re-evaluation, 97110-Therapeutic exercises, 97530- Therapeutic activity, 97112- Neuromuscular re-education, 97535- Self Care, [id number]- Manual therapy, [phone]- Gait training, [phone]- Orthotic Fit/training, [phone]- Aquatic Therapy, [phone]- Electrical stimulation (manual), Patient/Family education, Balance training, Stair training, Dry Needling, Joint mobilization, Spinal mobilization, Vestibular training, and DME instructions  PLAN FOR NEXT SESSION: Monitor vitals- pt very hypotensive. Work on return to hiking; NBOS, eyes closed, eccentric control, endurance, response to DN? L shoulder periscapular strength, does he want to schedule again with TT for DN of his L biceps and ant delt?   Kadia Abaya, PT Lorita Rosa, PT, DPT, CSRS    11/15/2023, 10:27 AM
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
## 2023-11-16 DIAGNOSIS — N319 Neuromuscular dysfunction of bladder, unspecified: Secondary | ICD-10-CM | POA: Diagnosis not present

## 2023-11-16 MED ORDER — MORPHINE SULFATE ER 15 MG PO TBCR: 15.0000 mg | EXTENDED_RELEASE_TABLET | Freq: Two times a day (BID) | ORAL | 0 refills | Status: DC

## 2023-11-17 ENCOUNTER — Encounter: Payer: Self-pay | Admitting: Internal Medicine

## 2023-11-17 DIAGNOSIS — H43813 Vitreous degeneration, bilateral: Secondary | ICD-10-CM | POA: Diagnosis not present

## 2023-11-17 DIAGNOSIS — H04123 Dry eye syndrome of bilateral lacrimal glands: Secondary | ICD-10-CM | POA: Diagnosis not present

## 2023-11-17 DIAGNOSIS — H401132 Primary open-angle glaucoma, bilateral, moderate stage: Secondary | ICD-10-CM | POA: Diagnosis not present

## 2023-11-17 DIAGNOSIS — H353131 Nonexudative age-related macular degeneration, bilateral, early dry stage: Secondary | ICD-10-CM | POA: Diagnosis not present

## 2023-11-17 DIAGNOSIS — H2512 Age-related nuclear cataract, left eye: Secondary | ICD-10-CM | POA: Diagnosis not present

## 2023-11-18 ENCOUNTER — Ambulatory Visit: Admitting: Physical Therapy

## 2023-11-18 ENCOUNTER — Ambulatory Visit: Admitting: Occupational Therapy

## 2023-11-18 VITALS — BP 112/75 | HR 77

## 2023-11-18 DIAGNOSIS — R2689 Other abnormalities of gait and mobility: Secondary | ICD-10-CM

## 2023-11-18 DIAGNOSIS — G8252 Quadriplegia, C1-C4 incomplete: Secondary | ICD-10-CM | POA: Diagnosis not present

## 2023-11-18 DIAGNOSIS — R278 Other lack of coordination: Secondary | ICD-10-CM | POA: Diagnosis not present

## 2023-11-18 DIAGNOSIS — M25512 Pain in left shoulder: Secondary | ICD-10-CM

## 2023-11-18 DIAGNOSIS — R208 Other disturbances of skin sensation: Secondary | ICD-10-CM

## 2023-11-18 DIAGNOSIS — M542 Cervicalgia: Secondary | ICD-10-CM | POA: Diagnosis not present

## 2023-11-18 DIAGNOSIS — M6281 Muscle weakness (generalized): Secondary | ICD-10-CM

## 2023-11-18 DIAGNOSIS — R29818 Other symptoms and signs involving the nervous system: Secondary | ICD-10-CM | POA: Diagnosis not present

## 2023-11-18 DIAGNOSIS — R2681 Unsteadiness on feet: Secondary | ICD-10-CM | POA: Diagnosis not present

## 2023-11-18 DIAGNOSIS — G8929 Other chronic pain: Secondary | ICD-10-CM | POA: Diagnosis not present

## 2023-11-18 DIAGNOSIS — R29898 Other symptoms and signs involving the musculoskeletal system: Secondary | ICD-10-CM | POA: Diagnosis not present

## 2023-11-18 DIAGNOSIS — M25511 Pain in right shoulder: Secondary | ICD-10-CM | POA: Diagnosis not present

## 2023-11-18 NOTE — Therapy (Signed)
 OUTPATIENT PHYSICAL THERAPY NEURO TREATMENT- 10TH VISIT PROGRESS NOTE    Patient Name: Brandon Watts MRN: [id number] DOB:15-Aug-1945, 78 y.o., male Today's Date: 11/18/2023   PCP: Colene Dauphin, MD REFERRING PROVIDER: Sterling Eisenmenger, PA-C  Physical Therapy Progress Note   Dates of Reporting Period:10/12/23 - 11/18/23  See Note below for Objective Data and Assessment of Progress/Goals.   END OF SESSION:  PT End of Session - 11/18/23 1020     Visit Number 10    Number of Visits 13    Date for PT Re-Evaluation 12/07/23    Authorization Type [employer] Medicare    PT Start Time 1018    PT Stop Time 1104    PT Time Calculation (min) 46 min    Equipment Utilized During Treatment Cervical collar   TED hose and ACE wraps   Activity Tolerance Patient tolerated treatment well    Behavior During Therapy WFL for tasks assessed/performed                  Past Medical History:  Diagnosis Date   Allergic rhinitis    Arthritis    PAIN AND OA RIGHT HIP   Arthritis    BPH (benign prostatic hyperplasia)    DDD (degenerative disc disease), lumbar    MILD-FOUND ON XRAY   Glaucoma    normotensive type with early changes   Glaucoma    Hepatitis    HEPATITIS C -CLEARED   Hepatitis    Hep C, treated and cleared   HLD (hyperlipidemia)    taking statin as preventative   Hypertension    Insomnia    CHRONIC   Insomnia    Positive PPD, treated    Pulmonary nodules    FOLLOWED BY DR. Armida Berry. GATES WITH CT EXAMS-MOST RECENT 01/22/12   Status post trigger finger release    right   Past Surgical History:  Procedure Laterality Date   ANTERIOR CERVICAL DECOMP/DISCECTOMY FUSION N/A 09/09/2023   Procedure: ANTERIOR CERVICAL DECOMPRESSION/DISCECTOMY FUSION 2 LEVEL/HARDWARE REMOVAL - C 3-4 , C4-5;  Surgeon: Van Gelinas, MD;  Location: [facility];  Service: Neurosurgery;  Laterality: N/A;   EYE SURGERY     LASIK EYE SURGERY   LITHOTRIPSY     x 3   LITHOTRIPSY     x2    TONSILLECTOMY     AS A CHILD   TONSILLECTOMY     TOTAL HIP ARTHROPLASTY Right 10/25/2012   Procedure: RIGHT TOTAL HIP ARTHROPLASTY ANTERIOR APPROACH;  Surgeon: Bevin Bucks, MD;  Location: [facility];  Service: Orthopedics;  Laterality: Right;   TOTAL HIP ARTHROPLASTY Right    TRIGGER FINGER RELEASE Right 2016   third digit right hand. Dr.Supple   URETERAL STONE EXTRACTION S X 2     VASCECTOMY     Patient Active Problem List   Diagnosis Date Noted   Hypotension 10/22/2023   Gross hematuria 10/22/2023   Constipation 10/22/2023   Quadriplegia, C1-C4, incomplete (HCC) 09/18/2023   C4 cervical fracture (HCC) 09/17/2023   Central cord syndrome (HCC) 09/17/2023   Dysphagia 09/17/2023   Respiratory failure with hypoxia (HCC) 09/17/2023   SDH (subdural hematoma) (HCC) 09/17/2023   Malnutrition of moderate degree 09/15/2023   Acute pulmonary embolism without acute cor pulmonale (HCC) 09/15/2023   Post-nasal drainage 09/15/2023   Pneumonia of left lower lobe due to infectious organism 09/15/2023   Hyperlipidemia 09/08/2023   Diastolic dysfunction without heart failure 09/17/2022   Episodic lightheadedness 08/26/2022   Vasovagal  near syncope 08/26/2022   Hypercalcemia 08/25/2022   Hyperglycemia 08/25/2022   Acute left-sided low back pain with left-sided sciatica 10/13/2021   Left renal mass 10/12/2021   Performance anxiety 08/17/2021   Glaucoma 08/14/2021   Insomnia 08/14/2021   Hyperlipoproteinemia 08/14/2021   BPH (benign prostatic hyperplasia) 08/14/2021   Erectile dysfunction 08/14/2021   Trigger finger, acquired 07/11/2013   S/P right THA, AA 10/25/2012   BACK PAIN, LUMBAR 01/22/2010   NONSPEC REACT TUBERCULIN SKN TEST W/O ACTV TB 06/13/2009   HEPATITIS C 02/12/2007   ALLERGIC RHINITIS 02/12/2007   History of colonic polyps 02/12/2007    ONSET DATE: 10/08/2023 (referral)   REFERRING DIAG: G82.52 (ICD-10-CM) - Quadriplegia, C1-C4 incomplete S12.300A (ICD-10-CM) - Unspecified  displaced fracture of fourth cervical vertebra, initial encounter for closed fracture S14.129A (ICD-10-CM) - Central cord syndrome at unspecified level of cervical spinal cord, initial encounter  THERAPY DIAG:  Muscle weakness (generalized)  Other abnormalities of gait and mobility  Unsteadiness on feet  Rationale for Evaluation and Treatment: Rehabilitation  SUBJECTIVE:                                                                                                                                                                                             SUBJECTIVE STATEMENT:  Pt reports he is feeling better today. Has a UTI, started antibiotics and is feeling a bit better.   Forgot the exercises Carolynne Citron gave him last time, knows it was for his bicep and shoulder   PT exercises are beating him up. Feels good before he does them. Still having difficulty w/toe raises, loses his balance.  Cancelled his trip to the [place].   Pt accompanied by: Self   PERTINENT HISTORY: HTN, HLD, BPH, Glaucoma, Hx of hep C s/p treatment  PAIN:  Are you having pain? Yes: NPRS scale: 6/10  Pain location: Bilateral shoulder girdles L > R  Pain description: Achy  Aggravating factors: Moving arm Relieving factors: Pain meds  PRECAUTIONS: Cervical, Fall, and Other: No lifting over 5#, foley catheter  RED FLAGS: None   WEIGHT BEARING RESTRICTIONS: No  FALLS: Has patient fallen in last 6 months? No  LIVING ENVIRONMENT: Lives with: lives with their spouse Lives in: House/apartment Stairs: Yes: Internal: 16 steps; on right going up, on left going up, and can reach both and External: 3 + 1 steps; on right going up, on left going up, and can reach both Has following equipment at home: Walker - 4 wheeled, Shower bench, and bed side commode  PLOF: Independent  PATIENT GOALS: "To be able to hike in the [place] in August and in  [place] in 07/2024. I would like to be able to play the cello again  and walk my dog along the trails in about a month"   OBJECTIVE:  Note: Objective measures were completed at Evaluation unless otherwise noted.  DIAGNOSTIC FINDINGS: MRI of C-spine from 09/08/2023  IMPRESSION: 1. Signal abnormality within the ventral spinal cord at the C3-C4 level likely reflecting cord contusion. 2. Edema at site of a known acute fracture within the C4 spinous process. 3. Ligamentum flavum irregularity at C4-C5 consistent with ligamentum flavum injury, and possible ligamentum flavum disruption. 4. Edema signal within the interspinous spaces at C3-C4, C4-C5, C5-C6, C6-C7 and C7-T1 consistent with interspinous ligament injury. 5. Thinned appearance of the anterior longitudinal ligament at the C3 level suspicious for anterior longitudinal ligament injury (without complete ligamentous disruption). 6. Prevertebral edema/hematoma spanning the C2-C6 levels. 7. Cervical spondylosis as outlined within the body of the report. Moderate spinal canal stenosis at C3-C4 and C4-C5. Multilevel foraminal stenosis, greatest bilaterally at C3-C4 (moderate/severe) and on the left at C4-C5 (moderate). 8. Vertebral body and left-sided facet ankylosis at C2-C3.  COGNITION: Overall cognitive status: Within functional limits for tasks assessed   SENSATION: Stocking numbness in BLEs   POSTURE: rounded shoulders and pt wearing cervical collar  LOWER EXTREMITY ROM:     Active  Right Eval Left Eval  Hip flexion    Hip extension    Hip abduction    Hip adduction    Hip internal rotation    Hip external rotation    Knee flexion    Knee extension    Ankle dorsiflexion    Ankle plantarflexion    Ankle inversion    Ankle eversion     (Blank rows = not tested)  LOWER EXTREMITY MMT:  Tested in seated position   MMT Right Eval Left Eval  Hip flexion 4- 4-  Hip extension    Hip abduction 5 5  Hip adduction 5 5  Hip internal rotation    Hip external rotation    Knee flexion  5 5  Knee extension 5 5  Ankle dorsiflexion 5 5  Ankle plantarflexion    Ankle inversion    Ankle eversion    (Blank rows = not tested)  BED MOBILITY:  Pt reports he needs more support to get out of his bed at home due to having to get out on the L side (was getting out on the R side in the hospital) and has difficulty propping up on L shoulder due to pain  TRANSFERS: Assistive device utilized: Environmental consultant - 4 wheeled  Sit to stand: SBA Stand to sit: SBA Chair to chair: SBA Noted decreased anterior weight shift initially w/minor lateral shift to L side. Poor eccentric control w/fatigue   STAIRS: Level of Assistance: SBA Stair Negotiation Technique: Alternating Pattern  with Bilateral Rails Number of Stairs: 4  Height of Stairs: 6"  Comments: Decreased eccentric control on RLE   GAIT: Gait pattern: step through pattern, lateral hip instability, trunk flexed, and wide BOS Distance walked: Various clinic distances  Assistive device utilized: Environmental consultant - 4 wheeled and None Level of assistance: Modified independence and SBA Comments: Pt is mod I w/rollator and SBA w/no AD due to anterior instability and decreased visual scanning ability   VITALS: Assessed in RUE in seated and standing position  Vitals:   11/18/23 1035 11/18/23 1038 11/18/23 1051  BP: (!) 156/75 115/71 112/75  Pulse: (!) 58 68 77  TREATMENT :   Self-care/home management  Assessed vitals (see above) in seated and then standing position. Pt drops 40 points systolic and does not drop in diastolic. Pt mildly symptomatic this date and wearing TED hose and BLE ACE wraps this date   Ther Act  Discussed reducing volume of PT HEP and breaking it up into two parts. Pt states he is receptive to this but would like to further discuss on Monday.   Gait pattern: step through pattern, decreased arm swing- Left, decreased stride length,  and wide BOS Distance walked: >500' outside on grassy hillside and [employer] device utilized: None Level of assistance: Modified independence and SBA Comments: Practiced navigation of steep hills (outside by pond), up/down inclines and on grass to work on stability on [employer] and endurance. Pt most challenged by steep uphill ascent, requiring single instance of min A for posterolateral LOB to L. Overall pt tolerated well and reported feeling good. Pt is walking up to 1 mile with dog at home on trails.   Reassessed BP at end of session and WNL (see above). Pt asymptomatic.    PATIENT EDUCATION: Education details: Plan to modify HEP next session  Person educated: Patient Education method: Explanation, Demonstration, and Verbal cues Education comprehension: verbalized understanding, returned demonstration, verbal cues required, and needs further education  HOME EXERCISE PROGRAM: From CIRDeronda Flesher; [id number]  Desensitization Techniques: -Light touch/pressure:  using fingertip pressure to slowly move up small segments of the affected body part until outside the area of pain and then back to where you started -Deep pressure:  using fingertips to squeeze in small segments starting outside the affected area, crossing over the affected area, and then to the other side of the affected area -Tapping:  fingertips tap with firm pressure over the affected area, can move around the affected area as well -Brushing/scratching:  use fingertips to brush/scratch over and around the affected area -Textures:  use soft and rough textured items like cotton balls vs wash cloths to brush or rub with light into firm pressure over and around the affected area  Repeat these 3-4x per day.   Verbally added 11/15/23: Supine serratus punch 3 x 10 reps Supine L biceps and ant delt stretch off EOB 3 x 30 sec  GOALS: Goals reviewed with patient? Yes  SHORT TERM GOALS: Target date:  11/09/2023   Pt will be independent with initial HEP for improved strength, balance, transfers and gait. Baseline: Goal status: MET  2.  MCTSIB to be assessed and STG/LTG updated ;  hold condition 4 on mCTSIB with minimal to no postural sway for improved balance. (SD) Baseline: 30 sec hold with moderate postural sway, 30 sec with mild postural sway (5/5) Goal status: MET  3.  to be assessed and LTG updated  Baseline: 1282.10' Goal status: Goal met 10-18-23  4.  Modified FGA to be assessed and LTG updated  Baseline: 22/30 Goal status: MET  LONG TERM GOALS: Target date: 11/23/2023   Patient will score >/=25/30 (accounting for not being able to complete head turns) on FGA to demonstrate improved walking balance Baseline: 22/30 Goal status: REVISED  2.  goal - increase distance to >1400' for increased endurance/gait efficiency. (SD) Baseline:  Goal status: INITIAL  3.  MCTSIB goal -  hold condition 4 on mCTSIB with minimal to no postural sway for improved balance. (SD) Baseline:  Goal status: INITIAL  4.  Pt will return to walking dog on trail w/SBA and  no instability for return to PLOF and improved independence  Baseline:  Goal status: INITIAL   ASSESSMENT:  CLINICAL IMPRESSION: Emphasis of skilled PT session on gait training on unlevel terrain and pt education regarding HEP modification. Pt reports his HEP continues to wear him out, so recommended pt reduce volume of his HEP and pt in agreement to discuss this further next visit. Majority of session spent ambulating outside on various terrains to work on return to hiking and functional endurance. Pt tolerated well but did have single LOB to L side w/steep incline, so will continue to work on posterior stability in sessions. Pt continues to be limited by >30 point systolic drop in BP from sitting to standing, but diastolic BP more stable. Continue POC.    OBJECTIVE IMPAIRMENTS: Abnormal gait, decreased activity  tolerance, decreased balance, decreased coordination, decreased endurance, decreased mobility, decreased strength, dizziness, impaired sensation, impaired UE functional use, improper body mechanics, and pain  ACTIVITY LIMITATIONS: carrying, lifting, bending, squatting, transfers, bed mobility, toileting, dressing, reach over head, hygiene/grooming, locomotion level, and caring for others  PARTICIPATION LIMITATIONS: meal prep, cleaning, laundry, medication management, driving, shopping, community activity, occupation, and yard work  PERSONAL FACTORS: Fitness, Past/current experiences, and 1 comorbidity: quadriplegia are also affecting patient's functional outcome.   REHAB POTENTIAL: Excellent  CLINICAL DECISION MAKING: Evolving/moderate complexity  EVALUATION COMPLEXITY: Moderate  PLAN:  PT FREQUENCY: 2x/week  PT DURATION: 6 weeks  PLANNED INTERVENTIONS: 97164- PT Re-evaluation, 97110-Therapeutic exercises, 97530- Therapeutic activity, 97112- Neuromuscular re-education, 97535- Self Care, [id number]- Manual therapy, [phone]- Gait training, [phone]- Orthotic Fit/training, [phone]- Aquatic Therapy, [phone]- Electrical stimulation (manual), Patient/Family education, Balance training, Stair training, Dry Needling, Joint mobilization, Spinal mobilization, Vestibular training, and DME instructions  PLAN FOR NEXT SESSION: Monitor vitals- pt very hypotensive. Work on return to hiking; NBOS, eyes closed, eccentric control, endurance, response to DN? L shoulder periscapular strength, does he want to schedule again with TT for DN of his L biceps and ant delt? Posterior stability (pt loses balance w/toe raises)    Gabrian Hoque E Eleanor Gatliff, PT, DPT  11/18/2023, 11:10 AM
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
## 2023-11-22 ENCOUNTER — Ambulatory Visit: Admitting: Occupational Therapy

## 2023-11-22 ENCOUNTER — Ambulatory Visit: Admitting: Physical Therapy

## 2023-11-22 DIAGNOSIS — R2681 Unsteadiness on feet: Secondary | ICD-10-CM

## 2023-11-22 DIAGNOSIS — M25511 Pain in right shoulder: Secondary | ICD-10-CM | POA: Diagnosis not present

## 2023-11-22 DIAGNOSIS — G8252 Quadriplegia, C1-C4 incomplete: Secondary | ICD-10-CM | POA: Diagnosis not present

## 2023-11-22 DIAGNOSIS — M542 Cervicalgia: Secondary | ICD-10-CM | POA: Diagnosis not present

## 2023-11-22 DIAGNOSIS — G8929 Other chronic pain: Secondary | ICD-10-CM | POA: Diagnosis not present

## 2023-11-22 DIAGNOSIS — R278 Other lack of coordination: Secondary | ICD-10-CM

## 2023-11-22 DIAGNOSIS — M25512 Pain in left shoulder: Secondary | ICD-10-CM | POA: Diagnosis not present

## 2023-11-22 DIAGNOSIS — R29898 Other symptoms and signs involving the musculoskeletal system: Secondary | ICD-10-CM | POA: Diagnosis not present

## 2023-11-22 DIAGNOSIS — R2689 Other abnormalities of gait and mobility: Secondary | ICD-10-CM

## 2023-11-22 DIAGNOSIS — R29818 Other symptoms and signs involving the nervous system: Secondary | ICD-10-CM

## 2023-11-22 DIAGNOSIS — M6281 Muscle weakness (generalized): Secondary | ICD-10-CM

## 2023-11-22 DIAGNOSIS — R208 Other disturbances of skin sensation: Secondary | ICD-10-CM | POA: Diagnosis not present
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
## 2023-11-22 NOTE — Therapy (Signed)
 OUTPATIENT PHYSICAL THERAPY NEURO TREATMENT   Patient Name: Brandon Watts MRN: [id number] DOB:April 03, 1946, 78 y.o., male Today's Date: 11/22/2023   PCP: Colene Dauphin, MD REFERRING PROVIDER: Sterling Eisenmenger, PA-C   END OF SESSION:  PT End of Session - 11/22/23 1017     Visit Number 11    Number of Visits 13    Date for PT Re-Evaluation 12/07/23    Authorization Type [employer] Medicare    PT Start Time 1016    PT Stop Time 1100    PT Time Calculation (min) 44 min    Equipment Utilized During Treatment Cervical collar   TED hose and ACE wraps   Activity Tolerance Patient tolerated treatment well    Behavior During Therapy WFL for tasks assessed/performed                   Past Medical History:  Diagnosis Date   Allergic rhinitis    Arthritis    PAIN AND OA RIGHT HIP   Arthritis    BPH (benign prostatic hyperplasia)    DDD (degenerative disc disease), lumbar    MILD-FOUND ON XRAY   Glaucoma    normotensive type with early changes   Glaucoma    Hepatitis    HEPATITIS C -CLEARED   Hepatitis    Hep C, treated and cleared   HLD (hyperlipidemia)    taking statin as preventative   Hypertension    Insomnia    CHRONIC   Insomnia    Positive PPD, treated    Pulmonary nodules    FOLLOWED BY DR. Armida Berry GATES WITH CT EXAMS-MOST RECENT 01/22/12   Status post trigger finger release    right   Past Surgical History:  Procedure Laterality Date   ANTERIOR CERVICAL DECOMP/DISCECTOMY FUSION N/A 09/09/2023   Procedure: ANTERIOR CERVICAL DECOMPRESSION/DISCECTOMY FUSION 2 LEVEL/HARDWARE REMOVAL - C 3-4 , C4-5;  Surgeon: Van Gelinas, MD;  Location: [facility];  Service: Neurosurgery;  Laterality: N/A;   EYE SURGERY     LASIK EYE SURGERY   LITHOTRIPSY     x 3   LITHOTRIPSY     x2   TONSILLECTOMY     AS A CHILD   TONSILLECTOMY     TOTAL HIP ARTHROPLASTY Right 10/25/2012   Procedure: RIGHT TOTAL HIP ARTHROPLASTY ANTERIOR APPROACH;  Surgeon: Bevin Bucks, MD;   Location: [facility];  Service: Orthopedics;  Laterality: Right;   TOTAL HIP ARTHROPLASTY Right    TRIGGER FINGER RELEASE Right 2016   third digit right hand. Dr.Supple   URETERAL STONE EXTRACTION S X 2     VASCECTOMY     Patient Active Problem List   Diagnosis Date Noted   Hypotension 10/22/2023   Gross hematuria 10/22/2023   Constipation 10/22/2023   Quadriplegia, C1-C4, incomplete (HCC) 09/18/2023   C4 cervical fracture (HCC) 09/17/2023   Central cord syndrome (HCC) 09/17/2023   Dysphagia 09/17/2023   Respiratory failure with hypoxia (HCC) 09/17/2023   SDH (subdural hematoma) (HCC) 09/17/2023   Malnutrition of moderate degree 09/15/2023   Acute pulmonary embolism without acute cor pulmonale (HCC) 09/15/2023   Post-nasal drainage 09/15/2023   Pneumonia of left lower lobe due to infectious organism 09/15/2023   Hyperlipidemia 09/08/2023   Diastolic dysfunction without heart failure 09/17/2022   Episodic lightheadedness 08/26/2022   Vasovagal near syncope 08/26/2022   Hypercalcemia 08/25/2022   Hyperglycemia 08/25/2022   Acute left-sided low back pain with left-sided sciatica 10/13/2021   Left renal mass 10/12/2021  Performance anxiety 08/17/2021   Glaucoma 08/14/2021   Insomnia 08/14/2021   Hyperlipoproteinemia 08/14/2021   BPH (benign prostatic hyperplasia) 08/14/2021   Erectile dysfunction 08/14/2021   Trigger finger, acquired 07/11/2013   S/P right THA, AA 10/25/2012   BACK PAIN, LUMBAR 01/22/2010   NONSPEC REACT TUBERCULIN SKN TEST W/O ACTV TB 06/13/2009   HEPATITIS C 02/12/2007   ALLERGIC RHINITIS 02/12/2007   History of colonic polyps 02/12/2007    ONSET DATE: 10/08/2023 (referral)   REFERRING DIAG: G82.52 (ICD-10-CM) - Quadriplegia, C1-C4 incomplete S12.300A (ICD-10-CM) - Unspecified displaced fracture of fourth cervical vertebra, initial encounter for closed fracture S14.129A (ICD-10-CM) - Central cord syndrome at unspecified level of cervical spinal cord, initial  encounter  THERAPY DIAG:  Muscle weakness (generalized)  Other abnormalities of gait and mobility  Unsteadiness on feet  Rationale for Evaluation and Treatment: Rehabilitation  SUBJECTIVE:                                                                                                                                                                                             SUBJECTIVE STATEMENT: Pt reports having a bad morning, lots of pain. Tried to mow the grass last week for 30 minutes and it knocked him out. Is starting to get discouraged about his progress.   Checked his BP this morning and his diastolic dropped 11 points for the first time in a while.    Pt accompanied by: Self   PERTINENT HISTORY: HTN, HLD, BPH, Glaucoma, Hx of hep C s/p treatment  PAIN:  Are you having pain? Yes: NPRS scale: 5/10  Pain location: Bilateral shoulder girdles L > R  Pain description: Achy  Aggravating factors: Moving arm Relieving factors: Pain meds  PRECAUTIONS: Cervical, Fall, and Other: No lifting over 5#, foley catheter  RED FLAGS: None   WEIGHT BEARING RESTRICTIONS: No  FALLS: Has patient fallen in last 6 months? No  LIVING ENVIRONMENT: Lives with: lives with their spouse Lives in: House/apartment Stairs: Yes: Internal: 16 steps; on right going up, on left going up, and can reach both and External: 3 + 1 steps; on right going up, on left going up, and can reach both Has following equipment at home: Otho Blitz - 4 wheeled, Shower bench, and bed side commode  PLOF: Independent  PATIENT GOALS: "To be able to hike in the [place] in August and in [place] in 07/2024. I would like to be able to play the cello again and walk my dog along the trails in about a month"   OBJECTIVE:  Note: Objective measures were completed at Evaluation unless otherwise noted.  DIAGNOSTIC FINDINGS: MRI  of C-spine from 09/08/2023  IMPRESSION: 1. Signal abnormality within the ventral spinal cord at  the C3-C4 level likely reflecting cord contusion. 2. Edema at site of a known acute fracture within the C4 spinous process. 3. Ligamentum flavum irregularity at C4-C5 consistent with ligamentum flavum injury, and possible ligamentum flavum disruption. 4. Edema signal within the interspinous spaces at C3-C4, C4-C5, C5-C6, C6-C7 and C7-T1 consistent with interspinous ligament injury. 5. Thinned appearance of the anterior longitudinal ligament at the C3 level suspicious for anterior longitudinal ligament injury (without complete ligamentous disruption). 6. Prevertebral edema/hematoma spanning the C2-C6 levels. 7. Cervical spondylosis as outlined within the body of the report. Moderate spinal canal stenosis at C3-C4 and C4-C5. Multilevel foraminal stenosis, greatest bilaterally at C3-C4 (moderate/severe) and on the left at C4-C5 (moderate). 8. Vertebral body and left-sided facet ankylosis at C2-C3.  COGNITION: Overall cognitive status: Within functional limits for tasks assessed   SENSATION: Stocking numbness in BLEs   POSTURE: rounded shoulders and pt wearing cervical collar  LOWER EXTREMITY ROM:     Active  Right Eval Left Eval  Hip flexion    Hip extension    Hip abduction    Hip adduction    Hip internal rotation    Hip external rotation    Knee flexion    Knee extension    Ankle dorsiflexion    Ankle plantarflexion    Ankle inversion    Ankle eversion     (Blank rows = not tested)  LOWER EXTREMITY MMT:  Tested in seated position   MMT Right Eval Left Eval  Hip flexion 4- 4-  Hip extension    Hip abduction 5 5  Hip adduction 5 5  Hip internal rotation    Hip external rotation    Knee flexion 5 5  Knee extension 5 5  Ankle dorsiflexion 5 5  Ankle plantarflexion    Ankle inversion    Ankle eversion    (Blank rows = not tested)  BED MOBILITY:  Pt reports he needs more support to get out of his bed at home due to having to get out on the L side (was  getting out on the R side in the hospital) and has difficulty propping up on L shoulder due to pain  TRANSFERS: Assistive device utilized: Environmental consultant - 4 wheeled  Sit to stand: SBA Stand to sit: SBA Chair to chair: SBA Noted decreased anterior weight shift initially w/minor lateral shift to L side. Poor eccentric control w/fatigue   STAIRS: Level of Assistance: SBA Stair Negotiation Technique: Alternating Pattern  with Bilateral Rails Number of Stairs: 4  Height of Stairs: 6"  Comments: Decreased eccentric control on RLE   GAIT: Gait pattern: step through pattern, lateral hip instability, trunk flexed, and wide BOS Distance walked: Various clinic distances  Assistive device utilized: Environmental consultant - 4 wheeled and None Level of assistance: Modified independence and SBA Comments: Pt is mod I w/rollator and SBA w/no AD due to anterior instability and decreased visual scanning ability   VITALS:  There were no vitals filed for this visit.  TREATMENT :   Ther Act  Pt brought HEP binder and majority of session spent revising HEP (see bolded below). Pt has divided HEP into two days (UE and LE) and reports each workout takes ~30 minutes. Removed sit to stands from HEP and replaced it w/staggered stance mini squats, which pt able to demonstrate well. Removed "curtsy stretch" and replaced w/seated HS stretch and standing calf stretch. Pt reported feeling a better stretch w/these.  Provided therapeutic listening as pt discussed challenges w/rehab and concerns w/progress. Informed pt that progress is not always linear and to prioritize rest when needed. Pt verbalized agreement and understanding.  Attempted seated scapular retraction, x10 reps, but pt reported significant pain in L shoulder w/movement.  Manual trigger point release to L middle trap and rhomboids, x8 minutes. Pt reports he ordered a theracane to  help with this at home. Educated pt on using lacrosse ball to perform mobilization either at wall or on ground. Demonstrated use of the wall in clinic and pt reported feeling a great relief, so will plan to purchase for home.    PATIENT EDUCATION: Education details: Updates to HEP  Person educated: Patient Education method: Programmer, multimedia, Facilities manager, Verbal cues, and Handouts Education comprehension: verbalized understanding, returned demonstration, verbal cues required, and needs further education  HOME EXERCISE PROGRAM: From CIRDeronda Flesher; [id number]  Desensitization Techniques: -Light touch/pressure:  using fingertip pressure to slowly move up small segments of the affected body part until outside the area of pain and then back to where you started -Deep pressure:  using fingertips to squeeze in small segments starting outside the affected area, crossing over the affected area, and then to the other side of the affected area -Tapping:  fingertips tap with firm pressure over the affected area, can move around the affected area as well -Brushing/scratching:  use fingertips to brush/scratch over and around the affected area -Textures:  use soft and rough textured items like cotton balls vs wash cloths to brush or rub with light into firm pressure over and around the affected area  Repeat these 3-4x per day.   Verbally added 11/15/23: Supine serratus punch 3 x 10 reps Supine L biceps and ant delt stretch off EOB 3 x 30 sec Access Code: [email or web address] URL: [email or web address] Date: 11/22/2023 Prepared by: Burleigh Carp Starlena Beil  Exercises - Seated Hamstring Stretch  - 1 x daily - 7 x weekly - 1-2 reps - 60-90 seconds hold - Soleus Stretch on Wall  - 1 x daily - 7 x weekly - 60-90 seconds hold - Staggered Stance Squat  - 1 x daily - 7 x weekly - 3 sets - 10 reps  GOALS: Goals reviewed with patient? Yes  SHORT TERM GOALS: Target date: 11/09/2023   Pt will be independent with initial  HEP for improved strength, balance, transfers and gait. Baseline: Goal status: MET  2.  MCTSIB to be assessed and STG/LTG updated ;  hold condition 4 on mCTSIB with minimal to no postural sway for improved balance. (SD) Baseline: 30 sec hold with moderate postural sway, 30 sec with mild postural sway (5/5) Goal status: MET  3.  to be assessed and LTG updated  Baseline: 1282.10' Goal status: Goal met 10-18-23  4.  Modified FGA to be assessed and LTG updated  Baseline: 22/30 Goal status: MET  LONG TERM GOALS: Target date: 11/23/2023   Patient will score >/=25/30 (accounting for not being able to complete head turns) on FGA to demonstrate improved walking balance Baseline: 22/30 Goal status:  REVISED  2.  goal - increase distance to >1400' for increased endurance/gait efficiency. (SD) Baseline:  Goal status: INITIAL  3.  MCTSIB goal -  hold condition 4 on mCTSIB with minimal to no postural sway for improved balance. (SD) Baseline:  Goal status: INITIAL  4.  Pt will return to walking dog on trail w/SBA and no instability for return to PLOF and improved independence  Baseline:  Goal status: INITIAL   ASSESSMENT:  CLINICAL IMPRESSION: Emphasis of skilled PT session on modifying HEP to reduce fatigue and manual therapy to periscapular musculature for pain relief. Pt reports he has had days where all he can do is sleep and thinks he overdid things by trying to mow the yard. Revised HEP to work on mobility and add more challenging tasks while emphasizing importance of RPE. Pt had good pain relief w/lacrosse ball on periscapulars against wall so will plan to purchase one for use at home. Continue POC.    OBJECTIVE IMPAIRMENTS: Abnormal gait, decreased activity tolerance, decreased balance, decreased coordination, decreased endurance, decreased mobility, decreased strength, dizziness, impaired sensation, impaired UE functional use, improper body mechanics, and pain  ACTIVITY  LIMITATIONS: carrying, lifting, bending, squatting, transfers, bed mobility, toileting, dressing, reach over head, hygiene/grooming, locomotion level, and caring for others  PARTICIPATION LIMITATIONS: meal prep, cleaning, laundry, medication management, driving, shopping, community activity, occupation, and yard work  PERSONAL FACTORS: Fitness, Past/current experiences, and 1 comorbidity: quadriplegia are also affecting patient's functional outcome.   REHAB POTENTIAL: Excellent  CLINICAL DECISION MAKING: Evolving/moderate complexity  EVALUATION COMPLEXITY: Moderate  PLAN:  PT FREQUENCY: 2x/week  PT DURATION: 6 weeks  PLANNED INTERVENTIONS: 97164- PT Re-evaluation, 97110-Therapeutic exercises, 97530- Therapeutic activity, 97112- Neuromuscular re-education, 97535- Self Care, 16109- Manual therapy, [phone]- Gait training, [phone]- Orthotic Fit/training, [phone]- Aquatic Therapy, [phone]- Electrical stimulation (manual), Patient/Family education, Balance training, Stair training, Dry Needling, Joint mobilization, Spinal mobilization, Vestibular training, and DME instructions  PLAN FOR NEXT SESSION: Goals and recert. Monitor vitals- pt very hypotensive. Work on return to hiking; NBOS, eyes closed, eccentric control, endurance, response to DN? L shoulder periscapular strength, does he want to schedule again with TT for DN of his L biceps and ant delt? Posterior stability (pt loses balance w/toe raises)    Christyanna Mckeon E Trina Asch, PT, DPT  11/22/2023, 11:46 AM
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
## 2023-11-25 ENCOUNTER — Ambulatory Visit: Admitting: Physical Therapy

## 2023-11-25 ENCOUNTER — Encounter: Payer: Self-pay | Admitting: Physical Medicine and Rehabilitation

## 2023-11-25 ENCOUNTER — Ambulatory Visit: Admitting: Occupational Therapy

## 2023-11-25 VITALS — BP 132/69 | HR 74

## 2023-11-25 DIAGNOSIS — R2681 Unsteadiness on feet: Secondary | ICD-10-CM | POA: Diagnosis not present

## 2023-11-25 DIAGNOSIS — M6281 Muscle weakness (generalized): Secondary | ICD-10-CM

## 2023-11-25 DIAGNOSIS — R2689 Other abnormalities of gait and mobility: Secondary | ICD-10-CM

## 2023-11-25 DIAGNOSIS — M25511 Pain in right shoulder: Secondary | ICD-10-CM | POA: Diagnosis not present

## 2023-11-25 DIAGNOSIS — R29898 Other symptoms and signs involving the musculoskeletal system: Secondary | ICD-10-CM | POA: Diagnosis not present

## 2023-11-25 DIAGNOSIS — M25512 Pain in left shoulder: Secondary | ICD-10-CM | POA: Diagnosis not present

## 2023-11-25 DIAGNOSIS — R278 Other lack of coordination: Secondary | ICD-10-CM | POA: Diagnosis not present

## 2023-11-25 DIAGNOSIS — M542 Cervicalgia: Secondary | ICD-10-CM | POA: Diagnosis not present

## 2023-11-25 DIAGNOSIS — R208 Other disturbances of skin sensation: Secondary | ICD-10-CM | POA: Diagnosis not present

## 2023-11-25 DIAGNOSIS — G8929 Other chronic pain: Secondary | ICD-10-CM | POA: Diagnosis not present

## 2023-11-25 DIAGNOSIS — R29818 Other symptoms and signs involving the nervous system: Secondary | ICD-10-CM | POA: Diagnosis not present

## 2023-11-25 DIAGNOSIS — G8252 Quadriplegia, C1-C4 incomplete: Secondary | ICD-10-CM | POA: Diagnosis not present

## 2023-11-25 NOTE — Therapy (Signed)
 OUTPATIENT PHYSICAL THERAPY NEURO TREATMENT  Patient Name: Brandon Watts MRN: [id number] DOB:May 12, 1946, 78 y.o., male Today's Date: 11/25/2023   PCP: Colene Dauphin, MD REFERRING PROVIDER: Sterling Eisenmenger, PA-C   END OF SESSION:  PT End of Session - 11/25/23 1103     Visit Number 12    Number of Visits 13    Date for PT Re-Evaluation 12/07/23    Authorization Type [employer] Medicare    PT Start Time 1101    PT Stop Time 1145    PT Time Calculation (min) 44 min    Equipment Utilized During Treatment Cervical collar   TED hose and ACE wraps   Activity Tolerance Patient limited by pain;Treatment limited secondary to medical complications (Comment)   Orthostatic hypotension   Behavior During Therapy WFL for tasks assessed/performed                    Past Medical History:  Diagnosis Date   Allergic rhinitis    Arthritis    PAIN AND OA RIGHT HIP   Arthritis    BPH (benign prostatic hyperplasia)    DDD (degenerative disc disease), lumbar    MILD-FOUND ON XRAY   Glaucoma    normotensive type with early changes   Glaucoma    Hepatitis    HEPATITIS C -CLEARED   Hepatitis    Hep C, treated and cleared   HLD (hyperlipidemia)    taking statin as preventative   Hypertension    Insomnia    CHRONIC   Insomnia    Positive PPD, treated    Pulmonary nodules    FOLLOWED BY DR. Armida Berry GATES WITH CT EXAMS-MOST RECENT 01/22/12   Status post trigger finger release    right   Past Surgical History:  Procedure Laterality Date   ANTERIOR CERVICAL DECOMP/DISCECTOMY FUSION N/A 09/09/2023   Procedure: ANTERIOR CERVICAL DECOMPRESSION/DISCECTOMY FUSION 2 LEVEL/HARDWARE REMOVAL - C 3-4 , C4-5;  Surgeon: Van Gelinas, MD;  Location: [facility];  Service: Neurosurgery;  Laterality: N/A;   EYE SURGERY     LASIK EYE SURGERY   LITHOTRIPSY     x 3   LITHOTRIPSY     x2   TONSILLECTOMY     AS A CHILD   TONSILLECTOMY     TOTAL HIP ARTHROPLASTY Right 10/25/2012   Procedure:  RIGHT TOTAL HIP ARTHROPLASTY ANTERIOR APPROACH;  Surgeon: Bevin Bucks, MD;  Location: [facility];  Service: Orthopedics;  Laterality: Right;   TOTAL HIP ARTHROPLASTY Right    TRIGGER FINGER RELEASE Right 2016   third digit right hand. Dr.Supple   URETERAL STONE EXTRACTION S X 2     VASCECTOMY     Patient Active Problem List   Diagnosis Date Noted   Hypotension 10/22/2023   Gross hematuria 10/22/2023   Constipation 10/22/2023   Quadriplegia, C1-C4, incomplete (HCC) 09/18/2023   C4 cervical fracture (HCC) 09/17/2023   Central cord syndrome (HCC) 09/17/2023   Dysphagia 09/17/2023   Respiratory failure with hypoxia (HCC) 09/17/2023   SDH (subdural hematoma) (HCC) 09/17/2023   Malnutrition of moderate degree 09/15/2023   Acute pulmonary embolism without acute cor pulmonale (HCC) 09/15/2023   Post-nasal drainage 09/15/2023   Pneumonia of left lower lobe due to infectious organism 09/15/2023   Hyperlipidemia 09/08/2023   Diastolic dysfunction without heart failure 09/17/2022   Episodic lightheadedness 08/26/2022   Vasovagal near syncope 08/26/2022   Hypercalcemia 08/25/2022   Hyperglycemia 08/25/2022   Acute left-sided low back pain with  left-sided sciatica 10/13/2021   Left renal mass 10/12/2021   Performance anxiety 08/17/2021   Glaucoma 08/14/2021   Insomnia 08/14/2021   Hyperlipoproteinemia 08/14/2021   BPH (benign prostatic hyperplasia) 08/14/2021   Erectile dysfunction 08/14/2021   Trigger finger, acquired 07/11/2013   S/P right THA, AA 10/25/2012   BACK PAIN, LUMBAR 01/22/2010   NONSPEC REACT TUBERCULIN SKN TEST W/O ACTV TB 06/13/2009   HEPATITIS C 02/12/2007   ALLERGIC RHINITIS 02/12/2007   History of colonic polyps 02/12/2007    ONSET DATE: 10/08/2023 (referral)   REFERRING DIAG: G82.52 (ICD-10-CM) - Quadriplegia, C1-C4 incomplete S12.300A (ICD-10-CM) - Unspecified displaced fracture of fourth cervical vertebra, initial encounter for closed fracture S14.129A  (ICD-10-CM) - Central cord syndrome at unspecified level of cervical spinal cord, initial encounter  THERAPY DIAG:  Muscle weakness (generalized)  Other abnormalities of gait and mobility  Unsteadiness on feet  Rationale for Evaluation and Treatment: Rehabilitation  SUBJECTIVE:                                                                                                                                                                                             SUBJECTIVE STATEMENT: Pt reports he over did it with the theracane. Only held pressure for 15s at a time but could not move afterwards. Went to the ballet last night and is very tired this morning. Had a rough morning. Still wearing the Saebo on his RUE, could not get it off this AM due to LUE pain. No falls.    Pt accompanied by: Self   PERTINENT HISTORY: HTN, HLD, BPH, Glaucoma, Hx of hep C s/p treatment  PAIN:  Are you having pain? Yes: NPRS scale: 5/10  Pain location: Bilateral shoulder girdles L > R  Pain description: Achy  Aggravating factors: Moving arm Relieving factors: Pain meds  PRECAUTIONS: Cervical, Fall, and Other: No lifting over 5#, foley catheter  RED FLAGS: None   WEIGHT BEARING RESTRICTIONS: No  FALLS: Has patient fallen in last 6 months? No  LIVING ENVIRONMENT: Lives with: lives with their spouse Lives in: House/apartment Stairs: Yes: Internal: 16 steps; on right going up, on left going up, and can reach both and External: 3 + 1 steps; on right going up, on left going up, and can reach both Has following equipment at home: Otho Blitz - 4 wheeled, Shower bench, and bed side commode  PLOF: Independent  PATIENT GOALS: "To be able to hike in the [place] in August and in [place] in 07/2024. I would like to be able to play the cello again and walk my dog along the trails in about a month"  OBJECTIVE:  Note: Objective measures were completed at Evaluation unless otherwise noted.  DIAGNOSTIC  FINDINGS: MRI of C-spine from 09/08/2023  IMPRESSION: 1. Signal abnormality within the ventral spinal cord at the C3-C4 level likely reflecting cord contusion. 2. Edema at site of a known acute fracture within the C4 spinous process. 3. Ligamentum flavum irregularity at C4-C5 consistent with ligamentum flavum injury, and possible ligamentum flavum disruption. 4. Edema signal within the interspinous spaces at C3-C4, C4-C5, C5-C6, C6-C7 and C7-T1 consistent with interspinous ligament injury. 5. Thinned appearance of the anterior longitudinal ligament at the C3 level suspicious for anterior longitudinal ligament injury (without complete ligamentous disruption). 6. Prevertebral edema/hematoma spanning the C2-C6 levels. 7. Cervical spondylosis as outlined within the body of the report. Moderate spinal canal stenosis at C3-C4 and C4-C5. Multilevel foraminal stenosis, greatest bilaterally at C3-C4 (moderate/severe) and on the left at C4-C5 (moderate). 8. Vertebral body and left-sided facet ankylosis at C2-C3.  COGNITION: Overall cognitive status: Within functional limits for tasks assessed   SENSATION: Stocking numbness in BLEs   POSTURE: rounded shoulders and pt wearing cervical collar  LOWER EXTREMITY ROM:     Active  Right Eval Left Eval  Hip flexion    Hip extension    Hip abduction    Hip adduction    Hip internal rotation    Hip external rotation    Knee flexion    Knee extension    Ankle dorsiflexion    Ankle plantarflexion    Ankle inversion    Ankle eversion     (Blank rows = not tested)  LOWER EXTREMITY MMT:  Tested in seated position   MMT Right Eval Left Eval  Hip flexion 4- 4-  Hip extension    Hip abduction 5 5  Hip adduction 5 5  Hip internal rotation    Hip external rotation    Knee flexion 5 5  Knee extension 5 5  Ankle dorsiflexion 5 5  Ankle plantarflexion    Ankle inversion    Ankle eversion    (Blank rows = not tested)  BED MOBILITY:   Pt reports he needs more support to get out of his bed at home due to having to get out on the L side (was getting out on the R side in the hospital) and has difficulty propping up on L shoulder due to pain  TRANSFERS: Assistive device utilized: Environmental consultant - 4 wheeled  Sit to stand: SBA Stand to sit: SBA Chair to chair: SBA Noted decreased anterior weight shift initially w/minor lateral shift to L side. Poor eccentric control w/fatigue   STAIRS: Level of Assistance: SBA Stair Negotiation Technique: Alternating Pattern  with Bilateral Rails Number of Stairs: 4  Height of Stairs: 6"  Comments: Decreased eccentric control on RLE   GAIT: Gait pattern: step through pattern, lateral hip instability, trunk flexed, and wide BOS Distance walked: Various clinic distances  Assistive device utilized: Walker - 4 wheeled and None Level of assistance: Modified independence and SBA Comments: Pt is mod I w/rollator and SBA w/no AD due to anterior instability and decreased visual scanning ability   VITALS:  Vitals:   11/25/23 1106 11/25/23 1107 11/25/23 1125  BP: 130/63 (!) 85/51 132/69 Comment: After Scifit  Pulse: 72 80 74  TREATMENT :   Self-care/home management  Assessed vitals in seated and standing position in RUE (see above) and pt very orthostatic and symptomatic today, with a 45 point systolic and 12 point diastolic drop. Educated pt on importance of rest and maintaining a regular schedule as his "bad" days tend to be following a late night or poor sleep. Pt verbalized understanding  Pt reports he is extremely sore following use of theracane, so educated on proper pressure to use when using device and where to place device. Pt verbalized understanding.   Ther Act  SciFit multi-peaks level 6.5 for 8 minutes using BUE/BLEs for neural priming for reciprocal movement, dynamic cardiovascular warmup  and increased amplitude of stepping. Reassessed BP following activity (see above) and WNL  Pt performed seated > prone mod I w/good log roll technique. Placed head in cervical pillow and removed cervical collar as it was choking pt: In prone, performed gentle trigger point mobilization to bilateral upper traps, L middle delt and L biceps. Also performed grade 1-2 mobilizations to thoracic and lumbar spine as pt has chronic back pain. Pt very TTP along upper traps bilaterally and could not tolerate much pressure.  Prone scapular retraction, x8 reps w/2s isometric hold. Pt reported pain with this (soreness) but was able to perform well.  Pt able to lift head off cervical pillow while adhering to cervical precautions and therapist placed cervical collar back on prior to pt performing prone > sit transfer.     PATIENT EDUCATION: Education details: See self-care section, continue HEP Person educated: Patient Education method: Explanation and Demonstration Education comprehension: verbalized understanding and needs further education  HOME EXERCISE PROGRAM: From CIRDeronda Flesher; [id number]  Desensitization Techniques: -Light touch/pressure:  using fingertip pressure to slowly move up small segments of the affected body part until outside the area of pain and then back to where you started -Deep pressure:  using fingertips to squeeze in small segments starting outside the affected area, crossing over the affected area, and then to the other side of the affected area -Tapping:  fingertips tap with firm pressure over the affected area, can move around the affected area as well -Brushing/scratching:  use fingertips to brush/scratch over and around the affected area -Textures:  use soft and rough textured items like cotton balls vs wash cloths to brush or rub with light into firm pressure over and around the affected area  Repeat these 3-4x per day.   Verbally added 11/15/23: Supine serratus punch 3 x 10  reps Supine L biceps and ant delt stretch off EOB 3 x 30 sec Access Code: [email or web address] URL: [email or web address] Date: 11/22/2023 Prepared by: Burleigh Carp Chessa Barrasso  Exercises - Seated Hamstring Stretch  - 1 x daily - 7 x weekly - 1-2 reps - 60-90 seconds hold - Soleus Stretch on Wall  - 1 x daily - 7 x weekly - 60-90 seconds hold - Staggered Stance Squat  - 1 x daily - 7 x weekly - 3 sets - 10 reps  GOALS: Goals reviewed with patient? Yes  SHORT TERM GOALS: Target date: 11/09/2023   Pt will be independent with initial HEP for improved strength, balance, transfers and gait. Baseline: Goal status: MET  2.  MCTSIB to be assessed and STG/LTG updated ;  hold condition 4 on mCTSIB with minimal to no postural sway for improved balance. (SD) Baseline: 30 sec hold with moderate postural sway, 30 sec with mild postural sway (5/5) Goal status: MET  3.  to be assessed and  LTG updated  Baseline: 1282.10' Goal status: Goal met 10-18-23  4.  Modified FGA to be assessed and LTG updated  Baseline: 22/30 Goal status: MET  LONG TERM GOALS: Target date: 11/23/2023   Patient will score >/=25/30 (accounting for not being able to complete head turns) on FGA to demonstrate improved walking balance Baseline: 22/30 Goal status: REVISED  2.  goal - increase distance to >1400' for increased endurance/gait efficiency. (SD) Baseline:  Goal status: INITIAL  3.  MCTSIB goal -  hold condition 4 on mCTSIB with minimal to no postural sway for improved balance. (SD) Baseline:  Goal status: INITIAL  4.  Pt will return to walking dog on trail w/SBA and no instability for return to PLOF and improved independence  Baseline:  Goal status: INITIAL   ASSESSMENT:  CLINICAL IMPRESSION: Emphasis of skilled PT session on pain modulation via trigger point relief and periscapular strength. Pt very limited by pain, fatigue and orthostatics today, reports he had a late night out and a rough morning.  Due to significant drop in BP in stance, session spent on mat table. Pt very TTP along upper traps bilaterally, L middle deltoid and L biceps. Attempted to perform gentle trigger point release to these areas as pt can tolerate, but pt very sore from use of theracane. Pt was able to perform prone scapular retraction this date, but was limited by pain in LUE. Continue POC.    OBJECTIVE IMPAIRMENTS: Abnormal gait, decreased activity tolerance, decreased balance, decreased coordination, decreased endurance, decreased mobility, decreased strength, dizziness, impaired sensation, impaired UE functional use, improper body mechanics, and pain  ACTIVITY LIMITATIONS: carrying, lifting, bending, squatting, transfers, bed mobility, toileting, dressing, reach over head, hygiene/grooming, locomotion level, and caring for others  PARTICIPATION LIMITATIONS: meal prep, cleaning, laundry, medication management, driving, shopping, community activity, occupation, and yard work  PERSONAL FACTORS: Fitness, Past/current experiences, and 1 comorbidity: quadriplegia are also affecting patient's functional outcome.   REHAB POTENTIAL: Excellent  CLINICAL DECISION MAKING: Evolving/moderate complexity  EVALUATION COMPLEXITY: Moderate  PLAN:  PT FREQUENCY: 2x/week  PT DURATION: 6 weeks   PLANNED INTERVENTIONS: 97164- PT Re-evaluation, 97110-Therapeutic exercises, 97530- Therapeutic activity, 97112- Neuromuscular re-education, 97535- Self Care, 16109- Manual therapy, [phone]- Gait training, [phone]- Orthotic Fit/training, [phone]- Aquatic Therapy, [phone]- Electrical stimulation (manual), Patient/Family education, Balance training, Stair training, Dry Needling, Joint mobilization, Spinal mobilization, Vestibular training, and DME instructions  PLAN FOR NEXT SESSION: Goals and recert for 6 weeks. Monitor vitals- pt very hypotensive. Work on return to hiking; NBOS, eyes closed, eccentric control, endurance, response to DN? L  shoulder periscapular strength, does he want to schedule again with TT for DN of his L biceps and ant delt? Posterior stability (pt loses balance w/toe raises)    Khallid Pasillas E Jermany Rimel, PT, DPT 11/25/2023, 11:54 AM

## 2023-11-26 ENCOUNTER — Encounter: Payer: Self-pay | Admitting: Physical Medicine and Rehabilitation

## 2023-11-29 ENCOUNTER — Ambulatory Visit: Admitting: Physical Therapy

## 2023-11-29 ENCOUNTER — Ambulatory Visit: Admitting: Occupational Therapy

## 2023-11-29 VITALS — BP 91/62 | HR 71

## 2023-11-29 DIAGNOSIS — R2689 Other abnormalities of gait and mobility: Secondary | ICD-10-CM

## 2023-11-29 DIAGNOSIS — G8929 Other chronic pain: Secondary | ICD-10-CM

## 2023-11-29 DIAGNOSIS — M25512 Pain in left shoulder: Secondary | ICD-10-CM | POA: Diagnosis not present

## 2023-11-29 DIAGNOSIS — R208 Other disturbances of skin sensation: Secondary | ICD-10-CM | POA: Diagnosis not present

## 2023-11-29 DIAGNOSIS — M25511 Pain in right shoulder: Secondary | ICD-10-CM | POA: Diagnosis not present

## 2023-11-29 DIAGNOSIS — R2681 Unsteadiness on feet: Secondary | ICD-10-CM | POA: Diagnosis not present

## 2023-11-29 DIAGNOSIS — M6281 Muscle weakness (generalized): Secondary | ICD-10-CM | POA: Diagnosis not present

## 2023-11-29 DIAGNOSIS — M542 Cervicalgia: Secondary | ICD-10-CM | POA: Diagnosis not present

## 2023-11-29 DIAGNOSIS — G8252 Quadriplegia, C1-C4 incomplete: Secondary | ICD-10-CM | POA: Diagnosis not present

## 2023-11-29 DIAGNOSIS — R29818 Other symptoms and signs involving the nervous system: Secondary | ICD-10-CM | POA: Diagnosis not present

## 2023-11-29 DIAGNOSIS — R29898 Other symptoms and signs involving the musculoskeletal system: Secondary | ICD-10-CM | POA: Diagnosis not present

## 2023-11-29 DIAGNOSIS — R278 Other lack of coordination: Secondary | ICD-10-CM | POA: Diagnosis not present
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
## 2023-11-29 NOTE — Therapy (Signed)
 OUTPATIENT PHYSICAL THERAPY NEURO TREATMENT - RECERTIFICATION  Patient Name: Brandon Watts MRN: [id number] DOB:Apr 24, 1946, 78 y.o., male Today's Date: 11/29/2023   PCP: Colene Dauphin, MD REFERRING PROVIDER: Sterling Eisenmenger, PA-C   END OF SESSION:  PT End of Session - 11/29/23 0936     Visit Number 13    Number of Visits 25   Recert   Date for PT Re-Evaluation 01/17/24   recert   Authorization Type [employer] Medicare    PT Start Time 0935    PT Stop Time 1018    PT Time Calculation (min) 43 min    Equipment Utilized During Treatment Cervical collar   TED hose and ACE wraps   Activity Tolerance Patient limited by pain    Behavior During Therapy WFL for tasks assessed/performed                     Past Medical History:  Diagnosis Date   Allergic rhinitis    Arthritis    PAIN AND OA RIGHT HIP   Arthritis    BPH (benign prostatic hyperplasia)    DDD (degenerative disc disease), lumbar    MILD-FOUND ON XRAY   Glaucoma    normotensive type with early changes   Glaucoma    Hepatitis    HEPATITIS C -CLEARED   Hepatitis    Hep C, treated and cleared   HLD (hyperlipidemia)    taking statin as preventative   Hypertension    Insomnia    CHRONIC   Insomnia    Positive PPD, treated    Pulmonary nodules    FOLLOWED BY DR. Armida Berry GATES WITH CT EXAMS-MOST RECENT 01/22/12   Status post trigger finger release    right   Past Surgical History:  Procedure Laterality Date   ANTERIOR CERVICAL DECOMP/DISCECTOMY FUSION N/A 09/09/2023   Procedure: ANTERIOR CERVICAL DECOMPRESSION/DISCECTOMY FUSION 2 LEVEL/HARDWARE REMOVAL - C 3-4 , C4-5;  Surgeon: Van Gelinas, MD;  Location: [facility];  Service: Neurosurgery;  Laterality: N/A;   EYE SURGERY     LASIK EYE SURGERY   LITHOTRIPSY     x 3   LITHOTRIPSY     x2   TONSILLECTOMY     AS A CHILD   TONSILLECTOMY     TOTAL HIP ARTHROPLASTY Right 10/25/2012   Procedure: RIGHT TOTAL HIP ARTHROPLASTY ANTERIOR APPROACH;   Surgeon: Bevin Bucks, MD;  Location: [facility];  Service: Orthopedics;  Laterality: Right;   TOTAL HIP ARTHROPLASTY Right    TRIGGER FINGER RELEASE Right 2016   third digit right hand. Dr.Supple   URETERAL STONE EXTRACTION S X 2     VASCECTOMY     Patient Active Problem List   Diagnosis Date Noted   Hypotension 10/22/2023   Gross hematuria 10/22/2023   Constipation 10/22/2023   Quadriplegia, C1-C4, incomplete (HCC) 09/18/2023   C4 cervical fracture (HCC) 09/17/2023   Central cord syndrome (HCC) 09/17/2023   Dysphagia 09/17/2023   Respiratory failure with hypoxia (HCC) 09/17/2023   SDH (subdural hematoma) (HCC) 09/17/2023   Malnutrition of moderate degree 09/15/2023   Acute pulmonary embolism without acute cor pulmonale (HCC) 09/15/2023   Post-nasal drainage 09/15/2023   Pneumonia of left lower lobe due to infectious organism 09/15/2023   Hyperlipidemia 09/08/2023   Diastolic dysfunction without heart failure 09/17/2022   Episodic lightheadedness 08/26/2022   Vasovagal near syncope 08/26/2022   Hypercalcemia 08/25/2022   Hyperglycemia 08/25/2022   Acute left-sided low back pain with left-sided sciatica  10/13/2021   Left renal mass 10/12/2021   Performance anxiety 08/17/2021   Glaucoma 08/14/2021   Insomnia 08/14/2021   Hyperlipoproteinemia 08/14/2021   BPH (benign prostatic hyperplasia) 08/14/2021   Erectile dysfunction 08/14/2021   Trigger finger, acquired 07/11/2013   S/P right THA, AA 10/25/2012   BACK PAIN, LUMBAR 01/22/2010   NONSPEC REACT TUBERCULIN SKN TEST W/O ACTV TB 06/13/2009   HEPATITIS C 02/12/2007   ALLERGIC RHINITIS 02/12/2007   History of colonic polyps 02/12/2007    ONSET DATE: 10/08/2023 (referral)   REFERRING DIAG: G82.52 (ICD-10-CM) - Quadriplegia, C1-C4 incomplete S12.300A (ICD-10-CM) - Unspecified displaced fracture of fourth cervical vertebra, initial encounter for closed fracture S14.129A (ICD-10-CM) - Central cord syndrome at unspecified level of  cervical spinal cord, initial encounter  THERAPY DIAG:  Muscle weakness (generalized)  Other abnormalities of gait and mobility  Unsteadiness on feet  Rationale for Evaluation and Treatment: Rehabilitation  SUBJECTIVE:                                                                                                                                                                                             SUBJECTIVE STATEMENT: Pt presents w/theracane. Has been trying to take it easy. Sees  Dr. Andy Bannister on Wednesday and hopes he can start working on his neck in PT.    Pt accompanied by: Self   PERTINENT HISTORY: HTN, HLD, BPH, Glaucoma, Hx of hep C s/p treatment  PAIN:  Are you having pain? Yes: NPRS scale: 3/10  Pain location: Bilateral shoulder girdles L > R  Pain description: Achy  Aggravating factors: Moving arm Relieving factors: Pain meds  PRECAUTIONS: Cervical, Fall, and Other: No lifting over 5#, foley catheter  RED FLAGS: None   WEIGHT BEARING RESTRICTIONS: No  FALLS: Has patient fallen in last 6 months? No  LIVING ENVIRONMENT: Lives with: lives with their spouse Lives in: House/apartment Stairs: Yes: Internal: 16 steps; on right going up, on left going up, and can reach both and External: 3 + 1 steps; on right going up, on left going up, and can reach both Has following equipment at home: Otho Blitz - 4 wheeled, Shower bench, and bed side commode  PLOF: Independent  PATIENT GOALS: "To be able to hike in the [place] in August and in [place] in 07/2024. I would like to be able to play the cello again and walk my dog along the trails in about a month"   OBJECTIVE:  Note: Objective measures were completed at Evaluation unless otherwise noted.  DIAGNOSTIC FINDINGS: MRI of C-spine from 09/08/2023  IMPRESSION: 1. Signal abnormality within the ventral spinal cord at the  C3-C4 level likely reflecting cord contusion. 2. Edema at site of a known acute fracture within  the C4 spinous process. 3. Ligamentum flavum irregularity at C4-C5 consistent with ligamentum flavum injury, and possible ligamentum flavum disruption. 4. Edema signal within the interspinous spaces at C3-C4, C4-C5, C5-C6, C6-C7 and C7-T1 consistent with interspinous ligament injury. 5. Thinned appearance of the anterior longitudinal ligament at the C3 level suspicious for anterior longitudinal ligament injury (without complete ligamentous disruption). 6. Prevertebral edema/hematoma spanning the C2-C6 levels. 7. Cervical spondylosis as outlined within the body of the report. Moderate spinal canal stenosis at C3-C4 and C4-C5. Multilevel foraminal stenosis, greatest bilaterally at C3-C4 (moderate/severe) and on the left at C4-C5 (moderate). 8. Vertebral body and left-sided facet ankylosis at C2-C3.  COGNITION: Overall cognitive status: Within functional limits for tasks assessed   SENSATION: Stocking numbness in BLEs   POSTURE: rounded shoulders and pt wearing cervical collar  LOWER EXTREMITY ROM:     Active  Right Eval Left Eval  Hip flexion    Hip extension    Hip abduction    Hip adduction    Hip internal rotation    Hip external rotation    Knee flexion    Knee extension    Ankle dorsiflexion    Ankle plantarflexion    Ankle inversion    Ankle eversion     (Blank rows = not tested)  LOWER EXTREMITY MMT:  Tested in seated position   MMT Right Eval Left Eval  Hip flexion 4- 4-  Hip extension    Hip abduction 5 5  Hip adduction 5 5  Hip internal rotation    Hip external rotation    Knee flexion 5 5  Knee extension 5 5  Ankle dorsiflexion 5 5  Ankle plantarflexion    Ankle inversion    Ankle eversion    (Blank rows = not tested)  BED MOBILITY:  Pt reports he needs more support to get out of his bed at home due to having to get out on the L side (was getting out on the R side in the hospital) and has difficulty propping up on L shoulder due to  pain  TRANSFERS: Assistive device utilized: Environmental consultant - 4 wheeled  Sit to stand: SBA Stand to sit: SBA Chair to chair: SBA Noted decreased anterior weight shift initially w/minor lateral shift to L side. Poor eccentric control w/fatigue   STAIRS: Level of Assistance: SBA Stair Negotiation Technique: Alternating Pattern  with Bilateral Rails Number of Stairs: 4  Height of Stairs: 6"  Comments: Decreased eccentric control on RLE   GAIT: Gait pattern: step through pattern, lateral hip instability, trunk flexed, and wide BOS Distance walked: Various clinic distances  Assistive device utilized: Walker - 4 wheeled and None Level of assistance: Modified independence and SBA Comments: Pt is mod I w/rollator and SBA w/no AD due to anterior instability and decreased visual scanning ability   VITALS:  Vitals:   11/29/23 0948 11/29/23 0949  BP: 129/74 91/62  Pulse: 60 71  TREATMENT :   Self-care/home management  Assessed vitals in seated and standing position in RUE (see above) and pt continues to be orthostatic, w/a 38 point systolic and 12 point diastolic drop today. Pt reports he has not been dropping as much at home and was hopeful to reduce his midodrine  soon. Pt plans to send photo of recent BP readings to Dr. Raynaldo Call.  Ther Act  Educated and demonstrated proper use of theracane to pt. Pt reports he has been pushing with all of his might into his trigger points, so encouraged gentle pressure w/cross-friction traction to these areas. Had pt teach back technique to therapist and pt reports this is more tolerable.  Per pt request, updated hand-written HEP and included the following:  LUE seated median nerve glide (maintained elbow flexion w/wrist extension and then radial deviation). Pt reports feeling a burn in his middle deltoid w/movement.  Isometric shoulder abduction at wall w/yellow  theraball, 3x10s holds on LUE and 3x5s holds on RUE. Min cues to avoid shoulder shrug on LUE w/movement  UBE for 8 minutes (4 retro and 4 fwd) for improved shoulder ROM and periscapular strength. Pt w/increased discomfort in fwd direction, but was tolerable. Encouraged pt to stop movement if too painful, but pt performing entirety of movement. Pt did report a "cold" feeling in LUE following activity, so had him perform more median nerve glides. Pt reports this did not alleviate feeling of coldness.  Discussed POC moving forward and pt reports his main goal is improved shoulder ROM. Did not formally assess this as OT has already measured this. Pt to see his surgeon on Wednesday, so will wait to assess FGA in case pt is able to move neck by next session.     PATIENT EDUCATION: Education details: Updates to HEP, goals for updated POC  Person educated: Patient Education method: Medical illustrator Education comprehension: verbalized understanding and needs further education  HOME EXERCISE PROGRAM: From CIRDeronda Flesher; [id number]  Desensitization Techniques: -Light touch/pressure:  using fingertip pressure to slowly move up small segments of the affected body part until outside the area of pain and then back to where you started -Deep pressure:  using fingertips to squeeze in small segments starting outside the affected area, crossing over the affected area, and then to the other side of the affected area -Tapping:  fingertips tap with firm pressure over the affected area, can move around the affected area as well -Brushing/scratching:  use fingertips to brush/scratch over and around the affected area -Textures:  use soft and rough textured items like cotton balls vs wash cloths to brush or rub with light into firm pressure over and around the affected area  Repeat these 3-4x per day.   Verbally added 11/15/23: Supine serratus punch 3 x 10 reps Supine L biceps and ant delt stretch off EOB 3  x 30 sec Access Code: [email or web address] URL: [email or web address] Date: 11/22/2023 Prepared by: Burleigh Carp Ebunoluwa Gernert  Exercises - Seated Hamstring Stretch  - 1 x daily - 7 x weekly - 1-2 reps - 60-90 seconds hold - Soleus Stretch on Wall  - 1 x daily - 7 x weekly - 60-90 seconds hold - Staggered Stance Squat  - 1 x daily - 7 x weekly - 3 sets - 10 reps  GOALS: Goals reviewed with patient? Yes  SHORT TERM GOALS: Target date: 11/09/2023   Pt will be independent with initial HEP for improved strength, balance, transfers and gait. Baseline: Goal status: MET  2.  MCTSIB to  be assessed and STG/LTG updated ;  hold condition 4 on mCTSIB with minimal to no postural sway for improved balance. (SD) Baseline: 30 sec hold with moderate postural sway, 30 sec with mild postural sway (5/5) Goal status: MET  3.  to be assessed and LTG updated  Baseline: 1282.10' Goal status: Goal met 10-18-23  4.  Modified FGA to be assessed and LTG updated  Baseline: 22/30 Goal status: MET  LONG TERM GOALS: Target date: 11/23/2023   Patient will score >/=25/30 (accounting for not being able to complete head turns) on FGA to demonstrate improved walking balance Baseline: 22/30 Goal status: IN PROGRESS  2.  goal - increase distance to >1400' for increased endurance/gait efficiency. (SD) Baseline:  Goal status: DC  3.  MCTSIB goal -  hold condition 4 on mCTSIB with minimal to no postural sway for improved balance. (SD) Baseline:  Goal status: MET  4.  Pt will return to walking dog on trail w/SBA and no instability for return to PLOF and improved independence  Baseline:  Goal status: MET  NEW SHORT TERM GOALS:   Target date: 12/27/2023  Patient will score >/=25/30 (accounting for not being able to complete head turns) on FGA to demonstrate improved walking balance Baseline: 22/30 Goal status: IN PROGRESS  2.  Cervical ROM goals to be written once pt cleared for A/ROM  Baseline: Pt sees MD  on 5/21 Goal status: INITIAL  3.  MiniBest to be assessed and LTG updated  Baseline:  Goal status: INITIAL   NEW LONG TERM GOALS:  Target date: 01/10/2024  Cervical ROM goal  Baseline:  Goal status: INITIAL  2.  MiniBest goal  Baseline:  Goal status: INITIAL   ASSESSMENT:  CLINICAL IMPRESSION: Emphasis of skilled PT session on LTG assessment, updating HEP and improved ROM of LUE. Pt has met 2 of 4 LTGs, demonstrating mild sway on condition 4 of MCTSIB on STG assessment and returning to walking dog without AD. DC pt's goal as pt is walking ~66mile w/wife at home and is no longer using AD. Did not assess FGA as pt may be cleared for cervical ROM on Wednesday and would like to assess stability w/head turns. Pt reports he would like to work on L shoulder ROM, which is being addressed by OT as well. Updated goals for extended POC to address cervical ROM and impaired stepping strategies via minibest. Continue POC.    OBJECTIVE IMPAIRMENTS: Abnormal gait, decreased activity tolerance, decreased balance, decreased coordination, decreased endurance, decreased mobility, decreased strength, dizziness, impaired sensation, impaired UE functional use, improper body mechanics, and pain  ACTIVITY LIMITATIONS: carrying, lifting, bending, squatting, transfers, bed mobility, toileting, dressing, reach over head, hygiene/grooming, locomotion level, and caring for others  PARTICIPATION LIMITATIONS: meal prep, cleaning, laundry, medication management, driving, shopping, community activity, occupation, and yard work  PERSONAL FACTORS: Fitness, Past/current experiences, and 1 comorbidity: quadriplegia are also affecting patient's functional outcome.   REHAB POTENTIAL: Excellent  CLINICAL DECISION MAKING: Evolving/moderate complexity  EVALUATION COMPLEXITY: Moderate  PLAN:  PT FREQUENCY: 2x/week  PT DURATION: 6 weeks + 6 weeks (recert)  PLANNED INTERVENTIONS: 16109- PT Re-evaluation,  97110-Therapeutic exercises, 97530- Therapeutic activity, 97112- Neuromuscular re-education, 97535- Self Care, [id number]- Manual therapy, [phone]- Gait training, [phone]- Orthotic Fit/training, [phone]- Aquatic Therapy, [phone]- Electrical stimulation (manual), Patient/Family education, Balance training, Stair training, Dry Needling, Joint mobilization, Spinal mobilization, Vestibular training, and DME instructions  PLAN FOR NEXT SESSION: Monitor vitals- pt very hypotensive. Work on return to hiking; NBOS,  eyes closed, eccentric control, endurance, response to DN? L shoulder periscapular strength, does he want to schedule again with TT for DN of his L biceps and ant delt? Posterior stability (pt loses balance w/toe raises)    Mattison Stuckey E Ziaire Hagos, PT, DPT 11/29/2023, 12:17 PM

## 2023-12-01 ENCOUNTER — Encounter: Payer: Self-pay | Admitting: Internal Medicine

## 2023-12-01 DIAGNOSIS — M4712 Other spondylosis with myelopathy, cervical region: Secondary | ICD-10-CM | POA: Diagnosis not present

## 2023-12-02 ENCOUNTER — Ambulatory Visit: Admitting: Occupational Therapy

## 2023-12-02 ENCOUNTER — Ambulatory Visit: Admitting: Physical Therapy

## 2023-12-02 VITALS — BP 139/79 | HR 64

## 2023-12-02 DIAGNOSIS — R278 Other lack of coordination: Secondary | ICD-10-CM | POA: Diagnosis not present

## 2023-12-02 DIAGNOSIS — M542 Cervicalgia: Secondary | ICD-10-CM

## 2023-12-02 DIAGNOSIS — R2681 Unsteadiness on feet: Secondary | ICD-10-CM

## 2023-12-02 DIAGNOSIS — M6281 Muscle weakness (generalized): Secondary | ICD-10-CM

## 2023-12-02 DIAGNOSIS — M25512 Pain in left shoulder: Secondary | ICD-10-CM | POA: Diagnosis not present

## 2023-12-02 DIAGNOSIS — M25511 Pain in right shoulder: Secondary | ICD-10-CM | POA: Diagnosis not present

## 2023-12-02 DIAGNOSIS — R29818 Other symptoms and signs involving the nervous system: Secondary | ICD-10-CM | POA: Diagnosis not present

## 2023-12-02 DIAGNOSIS — R29898 Other symptoms and signs involving the musculoskeletal system: Secondary | ICD-10-CM

## 2023-12-02 DIAGNOSIS — G8929 Other chronic pain: Secondary | ICD-10-CM

## 2023-12-02 DIAGNOSIS — R2689 Other abnormalities of gait and mobility: Secondary | ICD-10-CM | POA: Diagnosis not present

## 2023-12-02 DIAGNOSIS — R208 Other disturbances of skin sensation: Secondary | ICD-10-CM

## 2023-12-02 DIAGNOSIS — G8252 Quadriplegia, C1-C4 incomplete: Secondary | ICD-10-CM

## 2023-12-02 MED ORDER — SULFAMETHOXAZOLE-TRIMETHOPRIM 800-160 MG PO TABS: 1.0000 | ORAL_TABLET | Freq: Two times a day (BID) | ORAL | 0 refills | Status: AC
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
## 2023-12-02 NOTE — Therapy (Signed)
 OUTPATIENT PHYSICAL THERAPY NEURO TREATMENT   Patient Name: Brandon Watts MRN: [id number] DOB:1946/05/02, 78 y.o., male Today's Date: 12/02/2023   PCP: Colene Dauphin, MD REFERRING PROVIDER: Sterling Eisenmenger, PA-C   END OF SESSION:  PT End of Session - 12/02/23 0939     Visit Number 14    Number of Visits 25   Recert   Date for PT Re-Evaluation 01/17/24   recert   Authorization Type [employer] Medicare    PT Start Time [phone]   Pt arrived late   PT Stop Time 1020    PT Time Calculation (min) 42 min    Equipment Utilized During Treatment Cervical collar   TED hose and ACE wraps   Activity Tolerance Patient limited by pain    Behavior During Therapy WFL for tasks assessed/performed                     Past Medical History:  Diagnosis Date   Allergic rhinitis    Arthritis    PAIN AND OA RIGHT HIP   Arthritis    BPH (benign prostatic hyperplasia)    DDD (degenerative disc disease), lumbar    MILD-FOUND ON XRAY   Glaucoma    normotensive type with early changes   Glaucoma    Hepatitis    HEPATITIS C -CLEARED   Hepatitis    Hep C, treated and cleared   HLD (hyperlipidemia)    taking statin as preventative   Hypertension    Insomnia    CHRONIC   Insomnia    Positive PPD, treated    Pulmonary nodules    FOLLOWED BY DR. Armida Berry GATES WITH CT EXAMS-MOST RECENT 01/22/12   Status post trigger finger release    right   Past Surgical History:  Procedure Laterality Date   ANTERIOR CERVICAL DECOMP/DISCECTOMY FUSION N/A 09/09/2023   Procedure: ANTERIOR CERVICAL DECOMPRESSION/DISCECTOMY FUSION 2 LEVEL/HARDWARE REMOVAL - C 3-4 , C4-5;  Surgeon: Van Gelinas, MD;  Location: [facility];  Service: Neurosurgery;  Laterality: N/A;   EYE SURGERY     LASIK EYE SURGERY   LITHOTRIPSY     x 3   LITHOTRIPSY     x2   TONSILLECTOMY     AS A CHILD   TONSILLECTOMY     TOTAL HIP ARTHROPLASTY Right 10/25/2012   Procedure: RIGHT TOTAL HIP ARTHROPLASTY ANTERIOR APPROACH;   Surgeon: Bevin Bucks, MD;  Location: [facility];  Service: Orthopedics;  Laterality: Right;   TOTAL HIP ARTHROPLASTY Right    TRIGGER FINGER RELEASE Right 2016   third digit right hand. Dr.Supple   URETERAL STONE EXTRACTION S X 2     VASCECTOMY     Patient Active Problem List   Diagnosis Date Noted   Hypotension 10/22/2023   Gross hematuria 10/22/2023   Constipation 10/22/2023   Quadriplegia, C1-C4, incomplete (HCC) 09/18/2023   C4 cervical fracture (HCC) 09/17/2023   Central cord syndrome (HCC) 09/17/2023   Dysphagia 09/17/2023   Respiratory failure with hypoxia (HCC) 09/17/2023   SDH (subdural hematoma) (HCC) 09/17/2023   Malnutrition of moderate degree 09/15/2023   Acute pulmonary embolism without acute cor pulmonale (HCC) 09/15/2023   Post-nasal drainage 09/15/2023   Pneumonia of left lower lobe due to infectious organism 09/15/2023   Hyperlipidemia 09/08/2023   Diastolic dysfunction without heart failure 09/17/2022   Episodic lightheadedness 08/26/2022   Vasovagal near syncope 08/26/2022   Hypercalcemia 08/25/2022   Hyperglycemia 08/25/2022   Acute left-sided low back pain  with left-sided sciatica 10/13/2021   Left renal mass 10/12/2021   Performance anxiety 08/17/2021   Glaucoma 08/14/2021   Insomnia 08/14/2021   Hyperlipoproteinemia 08/14/2021   BPH (benign prostatic hyperplasia) 08/14/2021   Erectile dysfunction 08/14/2021   Trigger finger, acquired 07/11/2013   S/P right THA, AA 10/25/2012   BACK PAIN, LUMBAR 01/22/2010   NONSPEC REACT TUBERCULIN SKN TEST W/O ACTV TB 06/13/2009   HEPATITIS C 02/12/2007   ALLERGIC RHINITIS 02/12/2007   History of colonic polyps 02/12/2007    ONSET DATE: 10/08/2023 (referral)   REFERRING DIAG: G82.52 (ICD-10-CM) - Quadriplegia, C1-C4 incomplete S12.300A (ICD-10-CM) - Unspecified displaced fracture of fourth cervical vertebra, initial encounter for closed fracture S14.129A (ICD-10-CM) - Central cord syndrome at unspecified level of  cervical spinal cord, initial encounter  THERAPY DIAG:  Cervicalgia  Unsteadiness on feet  Muscle weakness (generalized)  Chronic left shoulder pain  Rationale for Evaluation and Treatment: Rehabilitation  SUBJECTIVE:                                                                                                                                                                                             SUBJECTIVE STATEMENT: Pt presents wearing cervical collar. Saw his neurosurgeon yesterday and no longer has to wear the collar, only for pain. Can start cervical PT today.    Pt accompanied by: Self   PERTINENT HISTORY: HTN, HLD, BPH, Glaucoma, Hx of hep C s/p treatment  PAIN:  Are you having pain? Yes: NPRS scale: 3/10  Pain location: Bilateral shoulder girdles L > R  Pain description: Achy  Aggravating factors: Moving arm Relieving factors: Pain meds  PRECAUTIONS: Cervical, Fall, and Other: No lifting over 5#, foley catheter  RED FLAGS: None   WEIGHT BEARING RESTRICTIONS: No  FALLS: Has patient fallen in last 6 months? No  LIVING ENVIRONMENT: Lives with: lives with their spouse Lives in: House/apartment Stairs: Yes: Internal: 16 steps; on right going up, on left going up, and can reach both and External: 3 + 1 steps; on right going up, on left going up, and can reach both Has following equipment at home: Otho Blitz - 4 wheeled, Shower bench, and bed side commode  PLOF: Independent  PATIENT GOALS: "To be able to hike in the [place] in August and in [place] in 07/2024. I would like to be able to play the cello again and walk my dog along the trails in about a month"   OBJECTIVE:  Note: Objective measures were completed at Evaluation unless otherwise noted.  DIAGNOSTIC FINDINGS: MRI of C-spine from 09/08/2023  IMPRESSION: 1. Signal abnormality within the ventral spinal cord at  the C3-C4 level likely reflecting cord contusion. 2. Edema at site of a known acute  fracture within the C4 spinous process. 3. Ligamentum flavum irregularity at C4-C5 consistent with ligamentum flavum injury, and possible ligamentum flavum disruption. 4. Edema signal within the interspinous spaces at C3-C4, C4-C5, C5-C6, C6-C7 and C7-T1 consistent with interspinous ligament injury. 5. Thinned appearance of the anterior longitudinal ligament at the C3 level suspicious for anterior longitudinal ligament injury (without complete ligamentous disruption). 6. Prevertebral edema/hematoma spanning the C2-C6 levels. 7. Cervical spondylosis as outlined within the body of the report. Moderate spinal canal stenosis at C3-C4 and C4-C5. Multilevel foraminal stenosis, greatest bilaterally at C3-C4 (moderate/severe) and on the left at C4-C5 (moderate). 8. Vertebral body and left-sided facet ankylosis at C2-C3.  COGNITION: Overall cognitive status: Within functional limits for tasks assessed   SENSATION: Stocking numbness in BLEs   POSTURE: rounded shoulders and pt wearing cervical collar  LOWER EXTREMITY ROM:     Active  Right Eval Left Eval  Hip flexion    Hip extension    Hip abduction    Hip adduction    Hip internal rotation    Hip external rotation    Knee flexion    Knee extension    Ankle dorsiflexion    Ankle plantarflexion    Ankle inversion    Ankle eversion     (Blank rows = not tested)  LOWER EXTREMITY MMT:  Tested in seated position   MMT Right Eval Left Eval  Hip flexion 4- 4-  Hip extension    Hip abduction 5 5  Hip adduction 5 5  Hip internal rotation    Hip external rotation    Knee flexion 5 5  Knee extension 5 5  Ankle dorsiflexion 5 5  Ankle plantarflexion    Ankle inversion    Ankle eversion    (Blank rows = not tested)  BED MOBILITY:  Pt reports he needs more support to get out of his bed at home due to having to get out on the L side (was getting out on the R side in the hospital) and has difficulty propping up on L shoulder due  to pain  TRANSFERS: Assistive device utilized: Environmental consultant - 4 wheeled  Sit to stand: SBA Stand to sit: SBA Chair to chair: SBA Noted decreased anterior weight shift initially w/minor lateral shift to L side. Poor eccentric control w/fatigue   STAIRS: Level of Assistance: SBA Stair Negotiation Technique: Alternating Pattern  with Bilateral Rails Number of Stairs: 4  Height of Stairs: 6"  Comments: Decreased eccentric control on RLE   GAIT: Gait pattern: step through pattern, lateral hip instability, trunk flexed, and wide BOS Distance walked: Various clinic distances  Assistive device utilized: Walker - 4 wheeled and None Level of assistance: Modified independence and SBA Comments: Pt is mod I w/rollator and SBA w/no AD due to anterior instability and decreased visual scanning ability   VITALS:  Vitals:   12/02/23 0947 12/02/23 0948  BP: (!) 143/82 139/79  Pulse: (!) 54 64  TREATMENT :   Self-care/home management  Assessed vitals in seated and standing position in RUE (see above) and pt not orthostatic today.  Ther Act/ Manual therapy  CERVICAL ROM: tested in seated position, pt w/p! In all directions   Active ROM A/PROM (deg) 12/02/23  Flexion 24  Extension 15  Right lateral flexion 13  Left lateral flexion 8  Right rotation 11  Left rotation 21   (Blank rows = not tested)   Pt performed sit > supine mod I. Placed towel roll under neck and performed the following for improved cervical ROM and pain modulation:   Supine chin tucks w5s isometric hold, x10 reps  Cervical flex/ext and then lateral rotation w/slow and controlled movement, x5 minutes. Pt denied significant pain w/activity  Attempted to roll to prone and have pt perform prone cervical extension, but pt unable to tolerate rolling due to pain and attempted to roll onto L shoulder w/arm extended and reported severe  pain. Will reattempt at later session but informed pt on need to work on bed mobility.  Supine occipital release, x3 minutes. Pt reported good stretch w/this  Supine chin tucks w/cervical distraction, 5x10s holds  Pt performed supine / long sitting mod I and performed manual trigger point release to L upper trap and cervical paraspinals.     PATIENT EDUCATION: Education details: Updates to HEP Person educated: Patient Education method: Medical illustrator Education comprehension: verbalized understanding and needs further education  HOME EXERCISE PROGRAM: From CIRDeronda Flesher; [id number]  Desensitization Techniques: Chere Cordon touch/pressure:  using fingertip pressure to slowly move up small segments of the affected body part until outside the area of pain and then back to where you started -Deep pressure:  using fingertips to squeeze in small segments starting outside the affected area, crossing over the affected area, and then to the other side of the affected area -Tapping:  fingertips tap with firm pressure over the affected area, can move around the affected area as well -Brushing/scratching:  use fingertips to brush/scratch over and around the affected area -Textures:  use soft and rough textured items like cotton balls vs wash cloths to brush or rub with light into firm pressure over and around the affected area  Repeat these 3-4x per day.   Verbally added 11/15/23: Supine serratus punch 3 x 10 reps Supine L biceps and ant delt stretch off EOB 3 x 30 sec Access Code: [email or web address] URL: [email or web address] Date: 11/22/2023 Prepared by: Burleigh Carp Manly Nestle  Exercises - Seated Hamstring Stretch  - 1 x daily - 7 x weekly - 1-2 reps - 60-90 seconds hold - Soleus Stretch on Wall  - 1 x daily - 7 x weekly - 60-90 seconds hold - Staggered Stance Squat  - 1 x daily - 7 x weekly - 3 sets - 10 reps - Supine Cervical Retraction with Towel  - 1 x daily - 7 x weekly - 3 sets - 10  reps - Supine Cervical Circles on Pillow  - 1 x daily - 7 x weekly - 3 sets - 10 reps  GOALS: Goals reviewed with patient? Yes  SHORT TERM GOALS: Target date: 11/09/2023   Pt will be independent with initial HEP for improved strength, balance, transfers and gait. Baseline: Goal status: MET  2.  MCTSIB to be assessed and STG/LTG updated ;  hold condition 4 on mCTSIB with minimal to no postural sway for improved balance. (SD) Baseline: 30 sec hold with moderate postural sway, 30 sec with mild postural sway (  5/5) Goal status: MET  3.  to be assessed and LTG updated  Baseline: 1282.10' Goal status: Goal met 10-18-23  4.  Modified FGA to be assessed and LTG updated  Baseline: 22/30 Goal status: MET  LONG TERM GOALS: Target date: 11/23/2023   Patient will score >/=25/30 (accounting for not being able to complete head turns) on FGA to demonstrate improved walking balance Baseline: 22/30 Goal status: IN PROGRESS  2.  goal - increase distance to >1400' for increased endurance/gait efficiency. (SD) Baseline:  Goal status: DC  3.  MCTSIB goal -  hold condition 4 on mCTSIB with minimal to no postural sway for improved balance. (SD) Baseline:  Goal status: MET  4.  Pt will return to walking dog on trail w/SBA and no instability for return to PLOF and improved independence  Baseline:  Goal status: MET  NEW SHORT TERM GOALS:   Target date: 12/27/2023  Patient will score >/=25/30 (accounting for not being able to complete head turns) on FGA to demonstrate improved walking balance Baseline: 22/30 Goal status: IN PROGRESS  2.  Pt will improve cervical A/ROM by 10 degrees in all planes for improved functional use of cervical spine and return to driving  Baseline: Assessed on 5/22  Goal status: REVISED  3.  MiniBest to be assessed and LTG updated  Baseline:  Goal status: INITIAL   NEW LONG TERM GOALS:  Target date: 01/10/2024  Pt will improve both left and right cervical  rotation to > 40 degrees for improved functional use of cervical spine and return to driving  Baseline: 11 degrees R, 21 degrees L Goal status: REVISED  2.  MiniBest goal  Baseline:  Goal status: INITIAL   ASSESSMENT:  CLINICAL IMPRESSION: Emphasis of skilled PT session on assessing cervical A/ROM, adding to HEP to improve cervical ROM and manual therapy for reduced pain. Pt no longer required to wear cervical collar and is cleared by neruo PA to work on cervical spine in PT. Pt tolerated session well w/no orthostatics this date but continues to be limited by severe pain in LUE. Pt most limited w/L lateral flexion and R rotation of cervical spine, which is a barrier that prevents him from driving. Continue POC.    OBJECTIVE IMPAIRMENTS: Abnormal gait, decreased activity tolerance, decreased balance, decreased coordination, decreased endurance, decreased mobility, decreased strength, dizziness, impaired sensation, impaired UE functional use, improper body mechanics, and pain  ACTIVITY LIMITATIONS: carrying, lifting, bending, squatting, transfers, bed mobility, toileting, dressing, reach over head, hygiene/grooming, locomotion level, and caring for others  PARTICIPATION LIMITATIONS: meal prep, cleaning, laundry, medication management, driving, shopping, community activity, occupation, and yard work  PERSONAL FACTORS: Fitness, Past/current experiences, and 1 comorbidity: quadriplegia are also affecting patient's functional outcome.   REHAB POTENTIAL: Excellent  CLINICAL DECISION MAKING: Evolving/moderate complexity  EVALUATION COMPLEXITY: Moderate  PLAN:  PT FREQUENCY: 2x/week  PT DURATION: 6 weeks + 6 weeks (recert)  PLANNED INTERVENTIONS: 15176- PT Re-evaluation, 97110-Therapeutic exercises, 97530- Therapeutic activity, 97112- Neuromuscular re-education, 97535- Self Care, 16073- Manual therapy, [phone]- Gait training, [phone]- Orthotic Fit/training, [phone]- Aquatic Therapy, [phone]-  Electrical stimulation (manual), Patient/Family education, Balance training, Stair training, Dry Needling, Joint mobilization, Spinal mobilization, Vestibular training, and DME instructions  PLAN FOR NEXT SESSION: Monitor vitals- pt very hypotensive. Work on return to hiking; NBOS, eyes closed, eccentric control, endurance, response to DN? L shoulder periscapular strength, does he want to schedule again with TT for DN of his L biceps and ant delt? Posterior stability (pt  loses balance w/toe raises), cervical ROM   Haidyn Chadderdon E Ranulfo Kall, PT, DPT 12/02/2023, 12:07 PM

## 2023-12-06 ENCOUNTER — Encounter: Payer: Self-pay | Admitting: Physical Medicine and Rehabilitation

## 2023-12-07 ENCOUNTER — Ambulatory Visit: Admitting: Occupational Therapy

## 2023-12-07 DIAGNOSIS — G8252 Quadriplegia, C1-C4 incomplete: Secondary | ICD-10-CM | POA: Diagnosis not present

## 2023-12-07 DIAGNOSIS — R29898 Other symptoms and signs involving the musculoskeletal system: Secondary | ICD-10-CM | POA: Diagnosis not present

## 2023-12-07 DIAGNOSIS — M25512 Pain in left shoulder: Secondary | ICD-10-CM

## 2023-12-07 DIAGNOSIS — M542 Cervicalgia: Secondary | ICD-10-CM | POA: Diagnosis not present

## 2023-12-07 DIAGNOSIS — R208 Other disturbances of skin sensation: Secondary | ICD-10-CM

## 2023-12-07 DIAGNOSIS — R2681 Unsteadiness on feet: Secondary | ICD-10-CM | POA: Diagnosis not present

## 2023-12-07 DIAGNOSIS — M25511 Pain in right shoulder: Secondary | ICD-10-CM | POA: Diagnosis not present

## 2023-12-07 DIAGNOSIS — M6281 Muscle weakness (generalized): Secondary | ICD-10-CM | POA: Diagnosis not present

## 2023-12-07 DIAGNOSIS — R278 Other lack of coordination: Secondary | ICD-10-CM | POA: Diagnosis not present

## 2023-12-07 DIAGNOSIS — R29818 Other symptoms and signs involving the nervous system: Secondary | ICD-10-CM | POA: Diagnosis not present

## 2023-12-07 DIAGNOSIS — G8929 Other chronic pain: Secondary | ICD-10-CM | POA: Diagnosis not present

## 2023-12-07 DIAGNOSIS — R2689 Other abnormalities of gait and mobility: Secondary | ICD-10-CM | POA: Diagnosis not present

## 2023-12-07 MED ORDER — QUETIAPINE FUMARATE 25 MG PO TABS: 25.0000 mg | ORAL_TABLET | Freq: Every day | ORAL | 5 refills | Status: AC
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
## 2023-12-09 ENCOUNTER — Ambulatory Visit: Admitting: Occupational Therapy

## 2023-12-09 ENCOUNTER — Ambulatory Visit: Admitting: Physical Therapy

## 2023-12-09 VITALS — BP 70/48 | HR 84

## 2023-12-09 DIAGNOSIS — M6281 Muscle weakness (generalized): Secondary | ICD-10-CM

## 2023-12-09 DIAGNOSIS — R208 Other disturbances of skin sensation: Secondary | ICD-10-CM | POA: Diagnosis not present

## 2023-12-09 DIAGNOSIS — M542 Cervicalgia: Secondary | ICD-10-CM | POA: Diagnosis not present

## 2023-12-09 DIAGNOSIS — R278 Other lack of coordination: Secondary | ICD-10-CM

## 2023-12-09 DIAGNOSIS — R29898 Other symptoms and signs involving the musculoskeletal system: Secondary | ICD-10-CM | POA: Diagnosis not present

## 2023-12-09 DIAGNOSIS — R2689 Other abnormalities of gait and mobility: Secondary | ICD-10-CM

## 2023-12-09 DIAGNOSIS — M25511 Pain in right shoulder: Secondary | ICD-10-CM | POA: Diagnosis not present

## 2023-12-09 DIAGNOSIS — R29818 Other symptoms and signs involving the nervous system: Secondary | ICD-10-CM

## 2023-12-09 DIAGNOSIS — G8252 Quadriplegia, C1-C4 incomplete: Secondary | ICD-10-CM | POA: Diagnosis not present

## 2023-12-09 DIAGNOSIS — R2681 Unsteadiness on feet: Secondary | ICD-10-CM | POA: Diagnosis not present

## 2023-12-09 DIAGNOSIS — M25512 Pain in left shoulder: Secondary | ICD-10-CM

## 2023-12-09 DIAGNOSIS — G8929 Other chronic pain: Secondary | ICD-10-CM | POA: Diagnosis not present

## 2023-12-09 NOTE — Therapy (Signed)
 OUTPATIENT PHYSICAL THERAPY NEURO TREATMENT   Patient Name: Brandon Watts MRN: [id number] DOB:1945-11-26, 78 y.o., male Today's Date: 12/09/2023   PCP: Colene Dauphin, MD REFERRING PROVIDER: Sterling Eisenmenger, PA-C   END OF SESSION:  PT End of Session - 12/09/23 0935     Visit Number 15    Number of Visits 25   Recert   Date for PT Re-Evaluation 01/17/24   recert   Authorization Type [employer]    Authorization Time Period 5/15-6/12/25 6 PT visits    PT Start Time 0933    PT Stop Time 1015    PT Time Calculation (min) 42 min    Equipment Utilized During Treatment Gait belt   TED hose and ACE wraps   Activity Tolerance Patient tolerated treatment well    Behavior During Therapy WFL for tasks assessed/performed                      Past Medical History:  Diagnosis Date   Allergic rhinitis    Arthritis    PAIN AND OA RIGHT HIP   Arthritis    BPH (benign prostatic hyperplasia)    DDD (degenerative disc disease), lumbar    MILD-FOUND ON XRAY   Glaucoma    normotensive type with early changes   Glaucoma    Hepatitis    HEPATITIS C -CLEARED   Hepatitis    Hep C, treated and cleared   HLD (hyperlipidemia)    taking statin as preventative   Hypertension    Insomnia    CHRONIC   Insomnia    Positive PPD, treated    Pulmonary nodules    FOLLOWED BY DR. Armida Berry GATES WITH CT EXAMS-MOST RECENT 01/22/12   Status post trigger finger release    right   Past Surgical History:  Procedure Laterality Date   ANTERIOR CERVICAL DECOMP/DISCECTOMY FUSION N/A 09/09/2023   Procedure: ANTERIOR CERVICAL DECOMPRESSION/DISCECTOMY FUSION 2 LEVEL/HARDWARE REMOVAL - C 3-4 , C4-5;  Surgeon: Van Gelinas, MD;  Location: [facility];  Service: Neurosurgery;  Laterality: N/A;   EYE SURGERY     LASIK EYE SURGERY   LITHOTRIPSY     x 3   LITHOTRIPSY     x2   TONSILLECTOMY     AS A CHILD   TONSILLECTOMY     TOTAL HIP ARTHROPLASTY Right 10/25/2012   Procedure: RIGHT  TOTAL HIP ARTHROPLASTY ANTERIOR APPROACH;  Surgeon: Bevin Bucks, MD;  Location: [facility];  Service: Orthopedics;  Laterality: Right;   TOTAL HIP ARTHROPLASTY Right    TRIGGER FINGER RELEASE Right 2016   third digit right hand. Dr.Supple   URETERAL STONE EXTRACTION S X 2     VASCECTOMY     Patient Active Problem List   Diagnosis Date Noted   Hypotension 10/22/2023   Gross hematuria 10/22/2023   Constipation 10/22/2023   Quadriplegia, C1-C4, incomplete (HCC) 09/18/2023   C4 cervical fracture (HCC) 09/17/2023   Central cord syndrome (HCC) 09/17/2023   Dysphagia 09/17/2023   Respiratory failure with hypoxia (HCC) 09/17/2023   SDH (subdural hematoma) (HCC) 09/17/2023   Malnutrition of moderate degree 09/15/2023   Acute pulmonary embolism without acute cor pulmonale (HCC) 09/15/2023   Post-nasal drainage 09/15/2023   Pneumonia of left lower lobe due to infectious organism 09/15/2023   Hyperlipidemia 09/08/2023   Diastolic dysfunction without heart failure 09/17/2022   Episodic lightheadedness 08/26/2022   Vasovagal near syncope 08/26/2022   Hypercalcemia 08/25/2022   Hyperglycemia 08/25/2022  Acute left-sided low back pain with left-sided sciatica 10/13/2021   Left renal mass 10/12/2021   Performance anxiety 08/17/2021   Glaucoma 08/14/2021   Insomnia 08/14/2021   Hyperlipoproteinemia 08/14/2021   BPH (benign prostatic hyperplasia) 08/14/2021   Erectile dysfunction 08/14/2021   Trigger finger, acquired 07/11/2013   S/P right THA, AA 10/25/2012   BACK PAIN, LUMBAR 01/22/2010   NONSPEC REACT TUBERCULIN SKN TEST W/O ACTV TB 06/13/2009   HEPATITIS C 02/12/2007   ALLERGIC RHINITIS 02/12/2007   History of colonic polyps 02/12/2007    ONSET DATE: 10/08/2023 (referral)   REFERRING DIAG: G82.52 (ICD-10-CM) - Quadriplegia, C1-C4 incomplete S12.300A (ICD-10-CM) - Unspecified displaced fracture of fourth cervical vertebra, initial encounter for closed fracture S14.129A (ICD-10-CM) -  Central cord syndrome at unspecified level of cervical spinal cord, initial encounter  THERAPY DIAG:  Other abnormalities of gait and mobility  Muscle weakness (generalized)  Cervicalgia  Rationale for Evaluation and Treatment: Rehabilitation  SUBJECTIVE:                                                                                                                                                                                             SUBJECTIVE STATEMENT: Pt presents not wearing collar. Reports feeling pretty good today. Has been working on his cervical HEP, is trying to take it easy. Wants to know how he can fix his walking   Pt accompanied by: Self   PERTINENT HISTORY: HTN, HLD, BPH, Glaucoma, Hx of hep C s/p treatment  PAIN:  Are you having pain? Yes: NPRS scale: 2/10  Pain location: Bilateral shoulder girdles L > R  Pain description: Achy  Aggravating factors: Moving arm Relieving factors: Pain meds  PRECAUTIONS: Cervical, Fall, and Other: No lifting over 5#, foley catheter  RED FLAGS: None   WEIGHT BEARING RESTRICTIONS: No  FALLS: Has patient fallen in last 6 months? No  LIVING ENVIRONMENT: Lives with: lives with their spouse Lives in: House/apartment Stairs: Yes: Internal: 16 steps; on right going up, on left going up, and can reach both and External: 3 + 1 steps; on right going up, on left going up, and can reach both Has following equipment at home: Otho Blitz - 4 wheeled, Shower bench, and bed side commode  PLOF: Independent  PATIENT GOALS: "To be able to hike in the [place] in August and in [place] in 07/2024. I would like to be able to play the cello again and walk my dog along the trails in about a month"   OBJECTIVE:  Note: Objective measures were completed at Evaluation unless otherwise noted.  DIAGNOSTIC FINDINGS: MRI of C-spine from 09/08/2023  IMPRESSION:  1. Signal abnormality within the ventral spinal cord at the C3-C4 level likely  reflecting cord contusion. 2. Edema at site of a known acute fracture within the C4 spinous process. 3. Ligamentum flavum irregularity at C4-C5 consistent with ligamentum flavum injury, and possible ligamentum flavum disruption. 4. Edema signal within the interspinous spaces at C3-C4, C4-C5, C5-C6, C6-C7 and C7-T1 consistent with interspinous ligament injury. 5. Thinned appearance of the anterior longitudinal ligament at the C3 level suspicious for anterior longitudinal ligament injury (without complete ligamentous disruption). 6. Prevertebral edema/hematoma spanning the C2-C6 levels. 7. Cervical spondylosis as outlined within the body of the report. Moderate spinal canal stenosis at C3-C4 and C4-C5. Multilevel foraminal stenosis, greatest bilaterally at C3-C4 (moderate/severe) and on the left at C4-C5 (moderate). 8. Vertebral body and left-sided facet ankylosis at C2-C3.  COGNITION: Overall cognitive status: Within functional limits for tasks assessed   SENSATION: Stocking numbness in BLEs   POSTURE: rounded shoulders and pt wearing cervical collar  LOWER EXTREMITY ROM:     Active  Right Eval Left Eval  Hip flexion    Hip extension    Hip abduction    Hip adduction    Hip internal rotation    Hip external rotation    Knee flexion    Knee extension    Ankle dorsiflexion    Ankle plantarflexion    Ankle inversion    Ankle eversion     (Blank rows = not tested)  LOWER EXTREMITY MMT:  Tested in seated position   MMT Right Eval Left Eval  Hip flexion 4- 4-  Hip extension    Hip abduction 5 5  Hip adduction 5 5  Hip internal rotation    Hip external rotation    Knee flexion 5 5  Knee extension 5 5  Ankle dorsiflexion 5 5  Ankle plantarflexion    Ankle inversion    Ankle eversion    (Blank rows = not tested)  BED MOBILITY:  Pt reports he needs more support to get out of his bed at home due to having to get out on the L side (was getting out on the R side in  the hospital) and has difficulty propping up on L shoulder due to pain  TRANSFERS: Assistive device utilized: Environmental consultant - 4 wheeled  Sit to stand: SBA Stand to sit: SBA Chair to chair: SBA Noted decreased anterior weight shift initially w/minor lateral shift to L side. Poor eccentric control w/fatigue   STAIRS: Level of Assistance: SBA Stair Negotiation Technique: Alternating Pattern  with Bilateral Rails Number of Stairs: 4  Height of Stairs: 6"  Comments: Decreased eccentric control on RLE   GAIT: Gait pattern: step through pattern, lateral hip instability, trunk flexed, and wide BOS Distance walked: Various clinic distances  Assistive device utilized: Environmental consultant - 4 wheeled and None Level of assistance: Modified independence and SBA Comments: Pt is mod I w/rollator and SBA w/no AD due to anterior instability and decreased visual scanning ability   VITALS:  Vitals:   12/09/23 0938 12/09/23 0940  BP: 109/66 (!) 70/48  Pulse: 67 84    CERVICAL ROM: tested in seated position, pt w/p! In all directions   Active ROM A/PROM (deg) 12/02/23  Flexion 24  Extension 15  Right lateral flexion 13  Left lateral flexion 8  Right rotation 11  Left rotation 21   (Blank rows = not tested)  TREATMENT :   Self-care/home management  Assessed vitals in seated and standing position in RUE (see above) and pt orthostatic today and symptomatic. Started pt on cardiovascular warmup and continued to monitor throughout session.   Ther Act SciFit multi-peaks level 10 for 8 minutes using BUE/BLEs for neural priming for reciprocal movement, dynamic cardiovascular warmup and increased amplitude of stepping. RPE of 7/10 following activity.  Prone cervical extension w/2-3s isometric holds w/towel under forehead, x10 reps, for improved paraspinal strength and mobility. Pt extremely challenged by this and reported  significant tightness in cervical spine. Educated pt on how to modify movement (modified plantigrade) if prone is too intense, pt verbalized understanding. Added to HEP (see bolded below)  In // bars for improved functional hip strength, endurance and single leg stability:  Lateral monster walks w/green theraband around feet, x2 reps each direction w/BUE support. Mod verbal cues to avoid sliding feet. Added to HEP (see bolded below) Fwd/retro monster walks w/green theraband around ankles, x3 reps each direction w/BUE support. Added to HEP (see bolded below)  6 Blaze pods on random reach setting for improved cervical A/ROM, single leg stability, LE coordination and hip strength. Performed for 1.5 minutes with BUE support and SBA  Round 1:  6 pods placed in two vertical lines to either side of pt w/green theraband tied around ankles.  84 hits. Notable errors/deficits:  Pt reporting dizziness w/activity but dissipated w/seated rest.    PATIENT EDUCATION: Education details: Updates to HEP Person educated: Patient Education method: Medical illustrator Education comprehension: verbalized understanding and needs further education  HOME EXERCISE PROGRAM: From CIRDeronda Flesher; [id number]  Desensitization Techniques: -Light touch/pressure:  using fingertip pressure to slowly move up small segments of the affected body part until outside the area of pain and then back to where you started -Deep pressure:  using fingertips to squeeze in small segments starting outside the affected area, crossing over the affected area, and then to the other side of the affected area -Tapping:  fingertips tap with firm pressure over the affected area, can move around the affected area as well -Brushing/scratching:  use fingertips to brush/scratch over and around the affected area -Textures:  use soft and rough textured items like cotton balls vs wash cloths to brush or rub with light into firm pressure over and  around the affected area  Repeat these 3-4x per day.   Verbally added 11/15/23: Supine serratus punch 3 x 10 reps Supine L biceps and ant delt stretch off EOB 3 x 30 sec Access Code: [email or web address] URL: [email or web address] Date: 11/22/2023 Prepared by: Burleigh Carp Filicia Scogin  Exercises - Seated Hamstring Stretch  - 1 x daily - 7 x weekly - 1-2 reps - 60-90 seconds hold - Soleus Stretch on Wall  - 1 x daily - 7 x weekly - 60-90 seconds hold - Staggered Stance Squat  - 1 x daily - 7 x weekly - 3 sets - 10 reps - Supine Cervical Retraction with Towel  - 1 x daily - 7 x weekly - 3 sets - 10 reps - Supine Cervical Circles on Pillow  - 1 x daily - 7 x weekly - 3 sets - 10 reps  Verbally added on 12/09/23:  - Fwd/retro/lat monster walks w/green resistance band  - Prone or modified plantigrade cervical extension w/2-3s hold   GOALS: Goals reviewed with patient? Yes  SHORT TERM GOALS: Target date: 11/09/2023   Pt will be independent with initial HEP for improved strength, balance, transfers and gait.  Baseline: Goal status: MET  2.  MCTSIB to be assessed and STG/LTG updated ;  hold condition 4 on mCTSIB with minimal to no postural sway for improved balance. (SD) Baseline: 30 sec hold with moderate postural sway, 30 sec with mild postural sway (5/5) Goal status: MET  3.  to be assessed and LTG updated  Baseline: 1282.10' Goal status: Goal met 10-18-23  4.  Modified FGA to be assessed and LTG updated  Baseline: 22/30 Goal status: MET  LONG TERM GOALS: Target date: 11/23/2023   Patient will score >/=25/30 (accounting for not being able to complete head turns) on FGA to demonstrate improved walking balance Baseline: 22/30 Goal status: IN PROGRESS  2.  goal - increase distance to >1400' for increased endurance/gait efficiency. (SD) Baseline:  Goal status: DC  3.  MCTSIB goal -  hold condition 4 on mCTSIB with minimal to no postural sway for improved balance.  (SD) Baseline:  Goal status: MET  4.  Pt will return to walking dog on trail w/SBA and no instability for return to PLOF and improved independence  Baseline:  Goal status: MET  NEW SHORT TERM GOALS:   Target date: 12/27/2023  Patient will score >/=25/30 (accounting for not being able to complete head turns) on FGA to demonstrate improved walking balance Baseline: 22/30 Goal status: IN PROGRESS  2.  Pt will improve cervical A/ROM by 10 degrees in all planes for improved functional use of cervical spine and return to driving  Baseline: Assessed on 5/22  Goal status: REVISED  3.  MiniBest to be assessed and LTG updated  Baseline:  Goal status: INITIAL   NEW LONG TERM GOALS:  Target date: 01/10/2024  Pt will improve both left and right cervical rotation to > 40 degrees for improved functional use of cervical spine and return to driving  Baseline: 11 degrees R, 21 degrees L Goal status: REVISED  2.  MiniBest goal  Baseline:  Goal status: INITIAL   ASSESSMENT:  CLINICAL IMPRESSION: Emphasis of skilled PT session on functional hip strength, paraspinal strength of cervical spine and single leg stability. Pt was orthostatic today but tolerated session well if moving. Pt challenged by prone cervical extension due to weakness but demonstrates improved awareness of body limitations. Pt reports he would like to "normalize" his gait, so added hip strengthening exercises to HEP which were challenging for pt. Pt reports he will start training on trails nearby to prepare for [place] trip in January 2026. Continue POC.    OBJECTIVE IMPAIRMENTS: Abnormal gait, decreased activity tolerance, decreased balance, decreased coordination, decreased endurance, decreased mobility, decreased strength, dizziness, impaired sensation, impaired UE functional use, improper body mechanics, and pain  ACTIVITY LIMITATIONS: carrying, lifting, bending, squatting, transfers, bed mobility, toileting, dressing,  reach over head, hygiene/grooming, locomotion level, and caring for others  PARTICIPATION LIMITATIONS: meal prep, cleaning, laundry, medication management, driving, shopping, community activity, occupation, and yard work  PERSONAL FACTORS: Fitness, Past/current experiences, and 1 comorbidity: quadriplegia are also affecting patient's functional outcome.   REHAB POTENTIAL: Excellent  CLINICAL DECISION MAKING: Evolving/moderate complexity  EVALUATION COMPLEXITY: Moderate  PLAN:  PT FREQUENCY: 2x/week  PT DURATION: 6 weeks + 6 weeks (recert)  PLANNED INTERVENTIONS: 75643- PT Re-evaluation, 97110-Therapeutic exercises, 97530- Therapeutic activity, 97112- Neuromuscular re-education, 97535- Self Care, 32951- Manual therapy, [phone]- Gait training, [phone]- Orthotic Fit/training, [phone]- Aquatic Therapy, [phone]- Electrical stimulation (manual), Patient/Family education, Balance training, Stair training, Dry Needling, Joint mobilization, Spinal mobilization, Vestibular training, and DME instructions  PLAN FOR  NEXT SESSION: Monitor vitals- pt very hypotensive. Work on return to hiking; NBOS, eyes closed, eccentric control, endurance, response to DN? L shoulder periscapular strength, does he want to schedule again with TT for DN of his L biceps and ant delt? Posterior stability (pt loses balance w/toe raises), cervical ROM   Isel Skufca E Makaylah Oddo, PT, DPT 12/09/2023, 11:58 AM
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
## 2023-12-10 ENCOUNTER — Encounter: Payer: Self-pay | Admitting: Physical Medicine and Rehabilitation

## 2023-12-11 ENCOUNTER — Other Ambulatory Visit: Payer: Self-pay | Admitting: Internal Medicine

## 2023-12-13 ENCOUNTER — Encounter: Admitting: Occupational Therapy

## 2023-12-13 ENCOUNTER — Ambulatory Visit: Attending: Physician Assistant | Admitting: Physical Therapy

## 2023-12-13 VITALS — BP 115/56 | HR 78

## 2023-12-13 DIAGNOSIS — M6281 Muscle weakness (generalized): Secondary | ICD-10-CM | POA: Diagnosis not present

## 2023-12-13 DIAGNOSIS — M25512 Pain in left shoulder: Secondary | ICD-10-CM | POA: Diagnosis not present

## 2023-12-13 DIAGNOSIS — R208 Other disturbances of skin sensation: Secondary | ICD-10-CM | POA: Diagnosis not present

## 2023-12-13 DIAGNOSIS — R278 Other lack of coordination: Secondary | ICD-10-CM | POA: Insufficient documentation

## 2023-12-13 DIAGNOSIS — R2689 Other abnormalities of gait and mobility: Secondary | ICD-10-CM | POA: Insufficient documentation

## 2023-12-13 DIAGNOSIS — R29818 Other symptoms and signs involving the nervous system: Secondary | ICD-10-CM | POA: Diagnosis not present

## 2023-12-13 DIAGNOSIS — G8929 Other chronic pain: Secondary | ICD-10-CM | POA: Diagnosis not present

## 2023-12-13 DIAGNOSIS — M542 Cervicalgia: Secondary | ICD-10-CM | POA: Diagnosis not present

## 2023-12-13 DIAGNOSIS — R2681 Unsteadiness on feet: Secondary | ICD-10-CM | POA: Diagnosis not present

## 2023-12-13 NOTE — Therapy (Signed)
 OUTPATIENT PHYSICAL THERAPY NEURO TREATMENT   Patient Name: Brandon Watts MRN: [id number] DOB:1946-04-02, 78 y.o., male Today's Date: 12/13/2023   PCP: Colene Dauphin, MD REFERRING PROVIDER: Sterling Eisenmenger, PA-C   END OF SESSION:  PT End of Session - 12/13/23 1023     Visit Number 16    Number of Visits 25   Recert   Date for PT Re-Evaluation 01/17/24   recert   Authorization Type [employer]    Authorization Time Period 5/15-6/12/25 6 PT visits    PT Start Time 1021   Pt arrived late   PT Stop Time 1110    PT Time Calculation (min) 49 min    Equipment Utilized During Treatment Cervical collar   TED hose and ACE wraps   Activity Tolerance Patient tolerated treatment well    Behavior During Therapy WFL for tasks assessed/performed                      Past Medical History:  Diagnosis Date   Allergic rhinitis    Arthritis    PAIN AND OA RIGHT HIP   Arthritis    BPH (benign prostatic hyperplasia)    DDD (degenerative disc disease), lumbar    MILD-FOUND ON XRAY   Glaucoma    normotensive type with early changes   Glaucoma    Hepatitis    HEPATITIS C -CLEARED   Hepatitis    Hep C, treated and cleared   HLD (hyperlipidemia)    taking statin as preventative   Hypertension    Insomnia    CHRONIC   Insomnia    Positive PPD, treated    Pulmonary nodules    FOLLOWED BY DR. Armida Berry GATES WITH CT EXAMS-MOST RECENT 01/22/12   Status post trigger finger release    right   Past Surgical History:  Procedure Laterality Date   ANTERIOR CERVICAL DECOMP/DISCECTOMY FUSION N/A 09/09/2023   Procedure: ANTERIOR CERVICAL DECOMPRESSION/DISCECTOMY FUSION 2 LEVEL/HARDWARE REMOVAL - C 3-4 , C4-5;  Surgeon: Van Gelinas, MD;  Location: [facility];  Service: Neurosurgery;  Laterality: N/A;   EYE SURGERY     LASIK EYE SURGERY   LITHOTRIPSY     x 3   LITHOTRIPSY     x2   TONSILLECTOMY     AS A CHILD   TONSILLECTOMY     TOTAL HIP ARTHROPLASTY Right 10/25/2012    Procedure: RIGHT TOTAL HIP ARTHROPLASTY ANTERIOR APPROACH;  Surgeon: Bevin Bucks, MD;  Location: [facility];  Service: Orthopedics;  Laterality: Right;   TOTAL HIP ARTHROPLASTY Right    TRIGGER FINGER RELEASE Right 2016   third digit right hand. Dr.Supple   URETERAL STONE EXTRACTION S X 2     VASCECTOMY     Patient Active Problem List   Diagnosis Date Noted   Hypotension 10/22/2023   Gross hematuria 10/22/2023   Constipation 10/22/2023   Quadriplegia, C1-C4, incomplete (HCC) 09/18/2023   C4 cervical fracture (HCC) 09/17/2023   Central cord syndrome (HCC) 09/17/2023   Dysphagia 09/17/2023   Respiratory failure with hypoxia (HCC) 09/17/2023   SDH (subdural hematoma) (HCC) 09/17/2023   Malnutrition of moderate degree 09/15/2023   Acute pulmonary embolism without acute cor pulmonale (HCC) 09/15/2023   Post-nasal drainage 09/15/2023   Pneumonia of left lower lobe due to infectious organism 09/15/2023   Hyperlipidemia 09/08/2023   Diastolic dysfunction without heart failure 09/17/2022   Episodic lightheadedness 08/26/2022   Vasovagal near syncope 08/26/2022   Hypercalcemia 08/25/2022  Hyperglycemia 08/25/2022   Acute left-sided low back pain with left-sided sciatica 10/13/2021   Left renal mass 10/12/2021   Performance anxiety 08/17/2021   Glaucoma 08/14/2021   Insomnia 08/14/2021   Hyperlipoproteinemia 08/14/2021   BPH (benign prostatic hyperplasia) 08/14/2021   Erectile dysfunction 08/14/2021   Trigger finger, acquired 07/11/2013   S/P right THA, AA 10/25/2012   BACK PAIN, LUMBAR 01/22/2010   NONSPEC REACT TUBERCULIN SKN TEST W/O ACTV TB 06/13/2009   HEPATITIS C 02/12/2007   ALLERGIC RHINITIS 02/12/2007   History of colonic polyps 02/12/2007    ONSET DATE: 10/08/2023 (referral)   REFERRING DIAG: G82.52 (ICD-10-CM) - Quadriplegia, C1-C4 incomplete S12.300A (ICD-10-CM) - Unspecified displaced fracture of fourth cervical vertebra, initial encounter for closed fracture S14.129A  (ICD-10-CM) - Central cord syndrome at unspecified level of cervical spinal cord, initial encounter  THERAPY DIAG:  Other abnormalities of gait and mobility  Muscle weakness (generalized)  Cervicalgia  Left shoulder pain, unspecified chronicity  Rationale for Evaluation and Treatment: Rehabilitation  SUBJECTIVE:                                                                                                                                                                                             SUBJECTIVE STATEMENT: Pt presents wearing cervical collar. Reports he had a rough weekend. Went to a Child psychotherapist on Saturday and was in severe pain Sunday. Was very lightheaded on Sunday, could not even make his breakfast without siting down. Has a lot of pain today too. Is wondering if he did too much over the weekend and aggravated his neck due to not wearing the collar, so is wearing the collar more now. Went on a 1.5 mile hike yesterday but did not do any of his HEP.  Walked very slowly but felt okay.    Pt accompanied by: Self   PERTINENT HISTORY: HTN, HLD, BPH, Glaucoma, Hx of hep C s/p treatment  PAIN:  Are you having pain? Yes: NPRS scale: 3/10  Pain location: Bilateral shoulder girdles L > R  Pain description: Achy  Aggravating factors: Moving arm Relieving factors: Pain meds  PRECAUTIONS: Cervical, Fall, and Other: No lifting over 5#, foley catheter  RED FLAGS: None   WEIGHT BEARING RESTRICTIONS: No  FALLS: Has patient fallen in last 6 months? No  LIVING ENVIRONMENT: Lives with: lives with their spouse Lives in: House/apartment Stairs: Yes: Internal: 16 steps; on right going up, on left going up, and can reach both and External: 3 + 1 steps; on right going up, on left going up, and can reach both Has following equipment at home: Otho Blitz - 4 wheeled,  Shower bench, and bed side commode  PLOF: Independent  PATIENT GOALS: "To be able to hike in the [place] in August  and in [place] in 07/2024. I would like to be able to play the cello again and walk my dog along the trails in about a month"   OBJECTIVE:  Note: Objective measures were completed at Evaluation unless otherwise noted.  DIAGNOSTIC FINDINGS: MRI of C-spine from 09/08/2023  IMPRESSION: 1. Signal abnormality within the ventral spinal cord at the C3-C4 level likely reflecting cord contusion. 2. Edema at site of a known acute fracture within the C4 spinous process. 3. Ligamentum flavum irregularity at C4-C5 consistent with ligamentum flavum injury, and possible ligamentum flavum disruption. 4. Edema signal within the interspinous spaces at C3-C4, C4-C5, C5-C6, C6-C7 and C7-T1 consistent with interspinous ligament injury. 5. Thinned appearance of the anterior longitudinal ligament at the C3 level suspicious for anterior longitudinal ligament injury (without complete ligamentous disruption). 6. Prevertebral edema/hematoma spanning the C2-C6 levels. 7. Cervical spondylosis as outlined within the body of the report. Moderate spinal canal stenosis at C3-C4 and C4-C5. Multilevel foraminal stenosis, greatest bilaterally at C3-C4 (moderate/severe) and on the left at C4-C5 (moderate). 8. Vertebral body and left-sided facet ankylosis at C2-C3.  COGNITION: Overall cognitive status: Within functional limits for tasks assessed   SENSATION: Stocking numbness in BLEs   POSTURE: rounded shoulders and pt wearing cervical collar  LOWER EXTREMITY ROM:     Active  Right Eval Left Eval  Hip flexion    Hip extension    Hip abduction    Hip adduction    Hip internal rotation    Hip external rotation    Knee flexion    Knee extension    Ankle dorsiflexion    Ankle plantarflexion    Ankle inversion    Ankle eversion     (Blank rows = not tested)  LOWER EXTREMITY MMT:  Tested in seated position   MMT Right Eval Left Eval  Hip flexion 4- 4-  Hip extension    Hip abduction 5 5  Hip  adduction 5 5  Hip internal rotation    Hip external rotation    Knee flexion 5 5  Knee extension 5 5  Ankle dorsiflexion 5 5  Ankle plantarflexion    Ankle inversion    Ankle eversion    (Blank rows = not tested)  BED MOBILITY:  Pt reports he needs more support to get out of his bed at home due to having to get out on the L side (was getting out on the R side in the hospital) and has difficulty propping up on L shoulder due to pain  TRANSFERS: Assistive device utilized: Environmental consultant - 4 wheeled  Sit to stand: SBA Stand to sit: SBA Chair to chair: SBA Noted decreased anterior weight shift initially w/minor lateral shift to L side. Poor eccentric control w/fatigue   STAIRS: Level of Assistance: SBA Stair Negotiation Technique: Alternating Pattern  with Bilateral Rails Number of Stairs: 4  Height of Stairs: 6"  Comments: Decreased eccentric control on RLE   GAIT: Gait pattern: step through pattern, lateral hip instability, trunk flexed, and wide BOS Distance walked: Various clinic distances  Assistive device utilized: Walker - 4 wheeled and None Level of assistance: Modified independence and SBA Comments: Pt is mod I w/rollator and SBA w/no AD due to anterior instability and decreased visual scanning ability   VITALS:  Vitals:   12/13/23 1036 12/13/23 1038  BP: 112/69 (!) 115/56  Pulse: 64 78     CERVICAL ROM: tested in seated position, pt w/p! In all directions   Active ROM A/PROM (deg) 12/02/23  Flexion 24  Extension 15  Right lateral flexion 13  Left lateral flexion 8  Right rotation 11  Left rotation 21   (Blank rows = not tested)                                                                                                TREATMENT :   Self-care/home management  Assessed vitals in seated and standing position in RUE (see above) and pt orthostatic w/diastolic today but not systolic. Pt reports he feels slightly lightheaded but not as symptomatic as last week.     Ther Act Pt inquiring if he has done too much to his neck and is worried about re-injuring nerves. Informed pt to implement a wear schedule w/cervical collar (wear it at night, in car and on walks) and then prn as needed. Reminded pt of slow regeneration of nerves and to modify exercises as needed.  Encouraged pt to return to the pool and work on breast stroke or simply floating in the water  (shallow water ) as pt swam before his injury and this could be good for him. Pt reports he is going to [facility] this weekend and the house will have a pool, so he will work on this.  At ballet bar for improved posterior chain stability, core stability and shoulder stability:  Modified plantigrade bird dogs, x7 per side. Min cues to avoid excessive lumbar lordosis and to maintain gaze in mirror to work on cervical extension isometrics. Pt performed well w/no increase in pain, so verbally added to HEP (see bolded below)  Standing shoulder lateral raises w/yellow resistance band, x10 reps on RUE and x5 reps on LUE. Pt performed well on RUE but did have single lateral LOB to R due to relying on lateral lean to perform, requiring CGA to stabilize. Pt unable to perform on LUE without shoulder shrug compensation, so provided max tactile cues to upper trap to reduce compensation. However, pt began to ER LUE, so regressed to pt performing w/no band and using mirror for visual biofeedback on shoulder position and pt performed well. Recommended pt perform this way at home as he tends to rely heavily on shoulder shrugs to move arm.  At wall, serratus wall pushes w/foam roller, x10 reps, for improved postural control, periscapular strength and scapulohumeral rhythm. Pt had difficulty maintaining contact w/LUE on foam roller due to fatigue but reported enjoying exercise. Added to HEP (see bolded below) using rolling pin at home.   PATIENT EDUCATION: Education details: Updates to HEP Person educated: Patient Education method:  Medical illustrator Education comprehension: verbalized understanding and needs further education  HOME EXERCISE PROGRAM: From CIRDeronda Flesher; [id number]  Desensitization Techniques: Chere Cordon touch/pressure:  using fingertip pressure to slowly move up small segments of the affected body part until outside the area of pain and then back to where you started -Deep pressure:  using fingertips to squeeze in small segments starting outside the affected area, crossing over  the affected area, and then to the other side of the affected area -Tapping:  fingertips tap with firm pressure over the affected area, can move around the affected area as well -Brushing/scratching:  use fingertips to brush/scratch over and around the affected area -Textures:  use soft and rough textured items like cotton balls vs wash cloths to brush or rub with light into firm pressure over and around the affected area  Repeat these 3-4x per day.   Verbally added 11/15/23: Supine serratus punch 3 x 10 reps Supine L biceps and ant delt stretch off EOB 3 x 30 sec Access Code: [email or web address] URL: [email or web address] Date: 11/22/2023 Prepared by: Burleigh Carp Miller Limehouse  Exercises - Seated Hamstring Stretch  - 1 x daily - 7 x weekly - 1-2 reps - 60-90 seconds hold - Soleus Stretch on Wall  - 1 x daily - 7 x weekly - 60-90 seconds hold - Staggered Stance Squat  - 1 x daily - 7 x weekly - 3 sets - 10 reps - Supine Cervical Retraction with Towel  - 1 x daily - 7 x weekly - 3 sets - 10 reps - Supine Cervical Circles on Pillow  - 1 x daily - 7 x weekly - 3 sets - 10 reps  Verbally added on 12/09/23:  - Fwd/retro/lat monster walks w/green resistance band  - Prone or modified plantigrade cervical extension w/2-3s hold   Verbally added on 12/13/23: - mod plantigrade bird dogs  -serratus wall pushes   GOALS: Goals reviewed with patient? Yes  SHORT TERM GOALS: Target date: 11/09/2023   Pt will be independent with initial  HEP for improved strength, balance, transfers and gait. Baseline: Goal status: MET  2.  MCTSIB to be assessed and STG/LTG updated ;  hold condition 4 on mCTSIB with minimal to no postural sway for improved balance. (SD) Baseline: 30 sec hold with moderate postural sway, 30 sec with mild postural sway (5/5) Goal status: MET  3.  to be assessed and LTG updated  Baseline: 1282.10' Goal status: Goal met 10-18-23  4.  Modified FGA to be assessed and LTG updated  Baseline: 22/30 Goal status: MET  LONG TERM GOALS: Target date: 11/23/2023   Patient will score >/=25/30 (accounting for not being able to complete head turns) on FGA to demonstrate improved walking balance Baseline: 22/30 Goal status: IN PROGRESS  2.  goal - increase distance to >1400' for increased endurance/gait efficiency. (SD) Baseline:  Goal status: DC  3.  MCTSIB goal -  hold condition 4 on mCTSIB with minimal to no postural sway for improved balance. (SD) Baseline:  Goal status: MET  4.  Pt will return to walking dog on trail w/SBA and no instability for return to PLOF and improved independence  Baseline:  Goal status: MET  NEW SHORT TERM GOALS:   Target date: 12/27/2023  Patient will score >/=25/30 (accounting for not being able to complete head turns) on FGA to demonstrate improved walking balance Baseline: 22/30 Goal status: IN PROGRESS  2.  Pt will improve cervical A/ROM by 10 degrees in all planes for improved functional use of cervical spine and return to driving  Baseline: Assessed on 5/22  Goal status: REVISED  3.  MiniBest to be assessed and LTG updated  Baseline:  Goal status: INITIAL   NEW LONG TERM GOALS:  Target date: 01/10/2024  Pt will improve both left and right cervical rotation to > 40 degrees for improved functional use of cervical spine and return  to driving  Baseline: 11 degrees R, 21 degrees L Goal status: REVISED  2.  MiniBest goal  Baseline:  Goal status:  INITIAL   ASSESSMENT:  CLINICAL IMPRESSION: Emphasis of skilled PT session on functional stability of shoulders, scapulohumeral rhythm and pt education. Pt reports having a very painful weekend, so recommended pt continue to wear cervical collar at night, when in the car and during activities like walking to reduce strain on neck musculature and assist w/pain management. Pt continues to be limited by orthostatics but is less symptomatic this date. Pt continues to rely heavily on shoulder shrug compensation on L side to move LUE, requiring max multimodal cues to reduce but was the most successful w/visual cues, so recommended pt perform shoulder exercises in front of mirror at home. Continue POC.    OBJECTIVE IMPAIRMENTS: Abnormal gait, decreased activity tolerance, decreased balance, decreased coordination, decreased endurance, decreased mobility, decreased strength, dizziness, impaired sensation, impaired UE functional use, improper body mechanics, and pain  ACTIVITY LIMITATIONS: carrying, lifting, bending, squatting, transfers, bed mobility, toileting, dressing, reach over head, hygiene/grooming, locomotion level, and caring for others  PARTICIPATION LIMITATIONS: meal prep, cleaning, laundry, medication management, driving, shopping, community activity, occupation, and yard work  PERSONAL FACTORS: Fitness, Past/current experiences, and 1 comorbidity: quadriplegia are also affecting patient's functional outcome.   REHAB POTENTIAL: Excellent  CLINICAL DECISION MAKING: Evolving/moderate complexity  EVALUATION COMPLEXITY: Moderate  PLAN:  PT FREQUENCY: 2x/week  PT DURATION: 6 weeks + 6 weeks (recert)  PLANNED INTERVENTIONS: 16109- PT Re-evaluation, 97110-Therapeutic exercises, 97530- Therapeutic activity, 97112- Neuromuscular re-education, 97535- Self Care, [id number]- Manual therapy, [phone]- Gait training, [phone]- Orthotic Fit/training, [phone]- Aquatic Therapy, [phone]- Electrical stimulation  (manual), Patient/Family education, Balance training, Stair training, Dry Needling, Joint mobilization, Spinal mobilization, Vestibular training, and DME instructions  PLAN FOR NEXT SESSION: Monitor vitals- pt very hypotensive. Work on return to hiking; NBOS, eyes closed, eccentric control, endurance, response to DN? L shoulder periscapular strength, does he want to schedule again with TT for DN of his L biceps and ant delt? Posterior stability (pt loses balance w/toe raises), cervical ROM. Send auth    Etta Gassett E Linde Wilensky, PT, DPT 12/13/2023, 11:10 AM

## 2023-12-14 ENCOUNTER — Other Ambulatory Visit: Payer: Self-pay

## 2023-12-14 ENCOUNTER — Encounter (HOSPITAL_COMMUNITY): Payer: Self-pay

## 2023-12-14 ENCOUNTER — Emergency Department (HOSPITAL_COMMUNITY)
Admission: EM | Admit: 2023-12-14 | Discharge: 2023-12-14 | Disposition: A | Discharge status: Home / Self Care | Attending: Emergency Medicine | Admitting: Emergency Medicine

## 2023-12-14 ENCOUNTER — Encounter: Payer: Self-pay | Admitting: Internal Medicine

## 2023-12-14 DIAGNOSIS — I10 Essential (primary) hypertension: Secondary | ICD-10-CM | POA: Insufficient documentation

## 2023-12-14 DIAGNOSIS — T83091A Other mechanical complication of indwelling urethral catheter, initial encounter: Secondary | ICD-10-CM | POA: Diagnosis not present

## 2023-12-14 DIAGNOSIS — R338 Other retention of urine: Secondary | ICD-10-CM | POA: Diagnosis present

## 2023-12-14 DIAGNOSIS — T83021A Displacement of indwelling urethral catheter, initial encounter: Secondary | ICD-10-CM | POA: Insufficient documentation

## 2023-12-14 DIAGNOSIS — T839XXA Unspecified complication of genitourinary prosthetic device, implant and graft, initial encounter: Secondary | ICD-10-CM

## 2023-12-14 DIAGNOSIS — Y732 Prosthetic and other implants, materials and accessory gastroenterology and urology devices associated with adverse incidents: Secondary | ICD-10-CM | POA: Diagnosis not present

## 2023-12-14 DIAGNOSIS — Z7901 Long term (current) use of anticoagulants: Secondary | ICD-10-CM | POA: Diagnosis not present

## 2023-12-14 DIAGNOSIS — R339 Retention of urine, unspecified: Secondary | ICD-10-CM | POA: Diagnosis not present

## 2023-12-14 LAB — URINALYSIS, ROUTINE W REFLEX MICROSCOPIC
Bilirubin Urine: NEGATIVE
Glucose, UA: NEGATIVE mg/dL
Ketones, ur: NEGATIVE mg/dL
Nitrite: POSITIVE — AB
Protein, ur: 30 mg/dL — AB
RBC / HPF: >50 RBC/hpf (ref 0–5)
Specific Gravity, Urine: 1.01 (ref 1.005–1.030)
WBC, UA: >50 WBC/hpf (ref 0–5)
pH: 5 (ref 5.0–8.0)

## 2023-12-14 MED ORDER — LIDOCAINE HCL URETHRAL/MUCOSAL 2 % EX GEL
1.0000 | Freq: Once | CUTANEOUS | Status: DC
  Filled 2023-12-14: qty 11

## 2023-12-14 MED ORDER — LIDOCAINE HCL URETHRAL/MUCOSAL 2 % EX GEL
1.0000 | Freq: Once | CUTANEOUS | Status: AC
  Administered 2023-12-14: 1 via TOPICAL
  Filled 2023-12-14: qty 11

## 2023-12-14 NOTE — ED Provider Notes (Signed)
 [place] EMERGENCY DEPARTMENT AT [facility] Provider Note   CSN: [id number] Arrival date & time: 12/14/23  0109     History  Chief Complaint  Patient presents with   Urinary Retention    Brandon Watts is a 78 y.o. male.  The history is provided by the patient.  Illness Location:  Bladder Quality:  Foley accidentally came out Severity:  Moderate Onset quality:  Sudden Duration:  5 days Timing:  Constant Progression:  Unchanged Chronicity:  New Context:  Patient with neurogenic bladder Relieved by:  Nothing Worsened by:  Nothing Ineffective treatments:  None Patient with arthritis and neurogenic bladder presents with foley catheter having been accidentally dislodged at 8pm.  Patient reports he needs a coud catheter for this issue.  No associated symptoms.      Past Medical History:  Diagnosis Date   Allergic rhinitis    Arthritis    PAIN AND OA RIGHT HIP   Arthritis    BPH (benign prostatic hyperplasia)    DDD (degenerative disc disease), lumbar    MILD-FOUND ON XRAY   Glaucoma    normotensive type with early changes   Glaucoma    Hepatitis    HEPATITIS C -CLEARED   Hepatitis    Hep C, treated and cleared   HLD (hyperlipidemia)    taking statin as preventative   Hypertension    Insomnia    CHRONIC   Insomnia    Positive PPD, treated    Pulmonary nodules    FOLLOWED BY DR. Armida Berry GATES WITH CT EXAMS-MOST RECENT 01/22/12   Status post trigger finger release    right     Home Medications Prior to Admission medications   Medication Sig Start Date End Date Taking? Authorizing Provider  acetaminophen  (TYLENOL ) 325 MG tablet Take 2 tablets (650 mg total) by mouth every 6 (six) hours as needed. 10/11/23   Angiulli, Everlyn Hockey, PA-C  apixaban  (ELIQUIS ) 5 MG TABS tablet Take 1 tablet (5 mg total) by mouth 2 (two) times daily. 11/08/23   Lovorn, Megan, MD  Baclofen  5 MG TABS Take 1 tablet (5 mg total) by mouth 3 (three) times daily as needed  (Spascity). 11/08/23   Lovorn, Megan, MD  bimatoprost (LUMIGAN) 0.01 % SOLN Apply to eye.    [provider]  celecoxib  (CELEBREX ) 200 MG capsule Take by mouth.    [provider]  Dorzolamide HCl-Timolol Mal PF 2-0.5 % SOLN Apply to eye.    [provider]  finasteride  (PROSCAR ) 5 MG tablet TAKE ONE TABLET ONCE DAILY 12/13/23   Colene Dauphin, MD  fluticasone  (FLONASE ) 50 MCG/ACT nasal spray Place 2 sprays into both nostrils daily.    [provider]  gabapentin  (NEURONTIN ) 400 MG capsule Take 1 capsule (400 mg total) by mouth 3 (three) times daily. 11/08/23   Lovorn, Megan, MD  melatonin 5 MG TABS Take 1 tablet (5 mg total) by mouth at bedtime. 10/11/23   Angiulli, Everlyn Hockey, PA-C  midodrine  (PROAMATINE ) 10 MG tablet Take 1 tablet (10 mg total) by mouth 3 (three) times daily with meals. 11/08/23   Lovorn, Megan, MD  morphine  (MS CONTIN ) 15 MG 12 hr tablet Take 1 tablet (15 mg total) by mouth every 12 (twelve) hours. For chronic neck pain 11/16/23   Colene Dauphin, MD  polyethylene glycol (MIRALAX  / GLYCOLAX ) 17 g packet Take 17 g by mouth daily as needed (mild to moderate constipation). 10/11/23   Angiulli, Everlyn Hockey, PA-C  QUEtiapine  (SEROQUEL ) 25 MG tablet Take 1 tablet (25 mg total) by mouth at bedtime. 12/07/23   Lovorn, Megan, MD  senna-docusate (SENOKOT-S) 8.6-50 MG tablet Take 2 tablets by mouth 2 (two) times daily. 10/11/23   Angiulli, Everlyn Hockey, PA-C  traMADol  (ULTRAM ) 50 MG tablet Take 1 tablet (50 mg total) by mouth every 6 (six) hours as needed for moderate pain (pain score 4-6). Patient not taking: Reported on 11/08/2023 10/11/23   Sterling Eisenmenger, PA-C      Allergies    Patient has no known allergies.    Review of Systems   Review of Systems  Genitourinary:  Positive for difficulty urinating.  All other systems reviewed and are negative.   Physical Exam Updated Vital Signs BP (!) 159/85 (BP Location: Right Arm)   Pulse 94   Resp 16   SpO2 96%   Physical Exam Vitals and nursing note reviewed.  Constitutional:      General: He is not in acute distress.    Appearance: He is well-developed. He is not diaphoretic.  HENT:     Head: Normocephalic and atraumatic.     Nose: Nose normal.  Eyes:     Conjunctiva/sclera: Conjunctivae normal.     Pupils: Pupils are equal, round, and reactive to light.  Neck:     Comments: Soft collar in place  Cardiovascular:     Rate and Rhythm: Normal rate and regular rhythm.  Pulmonary:     Effort: Pulmonary effort is normal.     Breath sounds: Normal breath sounds. No wheezing or rales.  Abdominal:     General: Bowel sounds are normal.     Palpations: Abdomen is soft.     Tenderness: There is no abdominal tenderness. There is no guarding or rebound.  Skin:    General: Skin is warm and dry.  Neurological:     Mental Status: He is alert.     Deep Tendon Reflexes: Reflexes normal.  Psychiatric:        Thought Content: Thought content normal.     ED Results / Procedures / Treatments   Labs (all labs ordered are listed, but only abnormal results are displayed) Labs Reviewed  CBC WITH DIFFERENTIAL/PLATELET  BASIC METABOLIC PANEL WITH GFR  URINALYSIS, ROUTINE W REFLEX MICROSCOPIC  I-STAT CHEM 8, ED    EKG None  Radiology No results found.  Procedures Procedures    Medications Ordered in ED Medications - No data to display  ED Course/ Medical Decision Making/ A&P                                 Medical Decision Making Patient with accidental dislodged indwelling foley catheter   Amount and/or Complexity of Data Reviewed External Data Reviewed: notes.    Details: Previous notes and labs reviewed.  Last creatinine in EPIC: 10/22/2023 0.91  Labs: ordered.    Details: Patient declined labs that were ordered   Risk Risk Details: Patient placed in triage 1 following checking in to the ED.  Seen in Triage 1.  Urojet ordered for foley catheter insertion, catheter insertion  ordered.  Coud placed and is draining yellow, non-turbid urine.  Urine was sent by nurse, Winda Hastings.   Patient declines to wait for this test.  Discharge instructions printed.  Follow up with urology for ongoing care.  Stable for discharge.      Final Clinical Impression(s) / ED Diagnoses  No signs of systemic illness or infection. The patient is nontoxic-appearing on exam and vital signs are within normal limits.  I have reviewed the triage vital signs and the nursing notes. Pertinent labs & imaging results that were available during my care of the patient were reviewed by me and considered in my medical decision making (see chart for details). After history, exam, and medical workup I feel the patient has been appropriately medically screened and is safe for discharge home. Pertinent diagnoses were discussed with the patient. Patient was given return precautions.      Symphany Fleissner, MD 12/14/23 [phone]

## 2023-12-14 NOTE — ED Notes (Signed)
 Pt does not not want to wait for UA to result

## 2023-12-14 NOTE — ED Notes (Signed)
 Pt refuses blood work, MD aware

## 2023-12-14 NOTE — ED Triage Notes (Signed)
 Pt reports his foley catheter came out around 9pm tonight and he has not been able to urinate much since then. Pt reports some urine output. Requesting coude foley catheter to be changed.

## 2023-12-15 DIAGNOSIS — N319 Neuromuscular dysfunction of bladder, unspecified: Secondary | ICD-10-CM | POA: Diagnosis not present

## 2023-12-16 ENCOUNTER — Encounter: Admitting: Occupational Therapy

## 2023-12-16 ENCOUNTER — Ambulatory Visit: Admitting: Physical Therapy

## 2023-12-16 VITALS — BP 121/70 | HR 55

## 2023-12-16 DIAGNOSIS — R2689 Other abnormalities of gait and mobility: Secondary | ICD-10-CM

## 2023-12-16 DIAGNOSIS — R278 Other lack of coordination: Secondary | ICD-10-CM | POA: Diagnosis not present

## 2023-12-16 DIAGNOSIS — M6281 Muscle weakness (generalized): Secondary | ICD-10-CM

## 2023-12-16 DIAGNOSIS — M25512 Pain in left shoulder: Secondary | ICD-10-CM

## 2023-12-16 DIAGNOSIS — R29818 Other symptoms and signs involving the nervous system: Secondary | ICD-10-CM | POA: Diagnosis not present

## 2023-12-16 DIAGNOSIS — R208 Other disturbances of skin sensation: Secondary | ICD-10-CM | POA: Diagnosis not present

## 2023-12-16 DIAGNOSIS — M542 Cervicalgia: Secondary | ICD-10-CM | POA: Diagnosis not present

## 2023-12-16 DIAGNOSIS — G8929 Other chronic pain: Secondary | ICD-10-CM | POA: Diagnosis not present

## 2023-12-16 DIAGNOSIS — R2681 Unsteadiness on feet: Secondary | ICD-10-CM | POA: Diagnosis not present

## 2023-12-16 MED ORDER — MORPHINE SULFATE ER 15 MG PO TBCR: 15.0000 mg | EXTENDED_RELEASE_TABLET | Freq: Two times a day (BID) | ORAL | 0 refills | Status: DC

## 2023-12-16 NOTE — Therapy (Signed)
 OUTPATIENT PHYSICAL THERAPY NEURO TREATMENT   Patient Name: Brandon Watts MRN: [id number] DOB:Apr 27, 1946, 78 y.o., male Today's Date: 12/16/2023   PCP: Colene Dauphin, MD REFERRING PROVIDER: Sterling Eisenmenger, PA-C   END OF SESSION:  PT End of Session - 12/16/23 1020     Visit Number 17    Number of Visits 25   Recert   Date for PT Re-Evaluation 01/17/24   recert   Authorization Type [employer]    Authorization Time Period 5/15-6/12/25 6 PT visits    PT Start Time 1019    PT Stop Time 1103    PT Time Calculation (min) 44 min    Equipment Utilized During Treatment --   TED hose and ACE wraps   Activity Tolerance Patient tolerated treatment well    Behavior During Therapy WFL for tasks assessed/performed                      Past Medical History:  Diagnosis Date   Allergic rhinitis    Arthritis    PAIN AND OA RIGHT HIP   Arthritis    BPH (benign prostatic hyperplasia)    DDD (degenerative disc disease), lumbar    MILD-FOUND ON XRAY   Glaucoma    normotensive type with early changes   Glaucoma    Hepatitis    HEPATITIS C -CLEARED   Hepatitis    Hep C, treated and cleared   HLD (hyperlipidemia)    taking statin as preventative   Hypertension    Insomnia    CHRONIC   Insomnia    Positive PPD, treated    Pulmonary nodules    FOLLOWED BY DR. Armida Berry GATES WITH CT EXAMS-MOST RECENT 01/22/12   Status post trigger finger release    right   Past Surgical History:  Procedure Laterality Date   ANTERIOR CERVICAL DECOMP/DISCECTOMY FUSION N/A 09/09/2023   Procedure: ANTERIOR CERVICAL DECOMPRESSION/DISCECTOMY FUSION 2 LEVEL/HARDWARE REMOVAL - C 3-4 , C4-5;  Surgeon: Van Gelinas, MD;  Location: [facility] OR;  Service: Neurosurgery;  Laterality: N/A;   EYE SURGERY     LASIK EYE SURGERY   LITHOTRIPSY     x 3   LITHOTRIPSY     x2   TONSILLECTOMY     AS A CHILD   TONSILLECTOMY     TOTAL HIP ARTHROPLASTY Right 10/25/2012   Procedure: RIGHT TOTAL HIP  ARTHROPLASTY ANTERIOR APPROACH;  Surgeon: Bevin Bucks, MD;  Location: [facility] ORS;  Service: Orthopedics;  Laterality: Right;   TOTAL HIP ARTHROPLASTY Right    TRIGGER FINGER RELEASE Right 2016   third digit right hand. Dr.Supple   URETERAL STONE EXTRACTION S X 2     VASCECTOMY     Patient Active Problem List   Diagnosis Date Noted   Hypotension 10/22/2023   Gross hematuria 10/22/2023   Constipation 10/22/2023   Quadriplegia, C1-C4, incomplete (HCC) 09/18/2023   C4 cervical fracture (HCC) 09/17/2023   Central cord syndrome (HCC) 09/17/2023   Dysphagia 09/17/2023   Respiratory failure with hypoxia (HCC) 09/17/2023   SDH (subdural hematoma) (HCC) 09/17/2023   Malnutrition of moderate degree 09/15/2023   Acute pulmonary embolism without acute cor pulmonale (HCC) 09/15/2023   Post-nasal drainage 09/15/2023   Pneumonia of left lower lobe due to infectious organism 09/15/2023   Hyperlipidemia 09/08/2023   Diastolic dysfunction without heart failure 09/17/2022   Episodic lightheadedness 08/26/2022   Vasovagal near syncope 08/26/2022   Hypercalcemia 08/25/2022   Hyperglycemia 08/25/2022  Acute left-sided low back pain with left-sided sciatica 10/13/2021   Left renal mass 10/12/2021   Performance anxiety 08/17/2021   Glaucoma 08/14/2021   Insomnia 08/14/2021   Hyperlipoproteinemia 08/14/2021   BPH (benign prostatic hyperplasia) 08/14/2021   Erectile dysfunction 08/14/2021   Trigger finger, acquired 07/11/2013   S/P right THA, AA 10/25/2012   BACK PAIN, LUMBAR 01/22/2010   NONSPEC REACT TUBERCULIN SKN TEST W/O ACTV TB 06/13/2009   HEPATITIS C 02/12/2007   ALLERGIC RHINITIS 02/12/2007   History of colonic polyps 02/12/2007    ONSET DATE: 10/08/2023 (referral)   REFERRING DIAG: G82.52 (ICD-10-CM) - Quadriplegia, C1-C4 incomplete S12.300A (ICD-10-CM) - Unspecified displaced fracture of fourth cervical vertebra, initial encounter for closed fracture S14.129A (ICD-10-CM) - Central cord  syndrome at unspecified level of cervical spinal cord, initial encounter  THERAPY DIAG:  Other abnormalities of gait and mobility  Muscle weakness (generalized)  Cervicalgia  Left shoulder pain, unspecified chronicity  Rationale for Evaluation and Treatment: Rehabilitation  SUBJECTIVE:                                                                                                                                                                                             SUBJECTIVE STATEMENT: Pt presents without cervical collar. Rating pain as a 3/10 currently. Did have to get up around 3am due to feeling a stabbing sensation in his shoulder. No falls.    Pt accompanied by: Self   PERTINENT HISTORY: HTN, HLD, BPH, Glaucoma, Hx of hep C s/p treatment  PAIN:  Are you having pain? Yes: NPRS scale: 3/10  Pain location: Bilateral shoulder girdles L > R  Pain description: Achy  Aggravating factors: Moving arm Relieving factors: Pain meds  PRECAUTIONS: Cervical, Fall, and Other: No lifting over 5#, foley catheter  RED FLAGS: None   WEIGHT BEARING RESTRICTIONS: No  FALLS: Has patient fallen in last 6 months? No  LIVING ENVIRONMENT: Lives with: lives with their spouse Lives in: House/apartment Stairs: Yes: Internal: 16 steps; on right going up, on left going up, and can reach both and External: 3 + 1 steps; on right going up, on left going up, and can reach both Has following equipment at home: Otho Blitz - 4 wheeled, Shower bench, and bed side commode  PLOF: Independent  PATIENT GOALS: "[place] in [place] and in [place] in 07/2024. I would like to be able to play the cello again and walk my dog along the trails in about a month"   OBJECTIVE:  Note: Objective measures were completed at Evaluation unless otherwise noted.  DIAGNOSTIC FINDINGS: MRI of C-spine  from 09/08/2023  IMPRESSION: 1. Signal abnormality within the ventral spinal cord at the  C3-C4 level likely reflecting cord contusion. 2. Edema at site of a known acute fracture within the C4 spinous process. 3. Ligamentum flavum irregularity at C4-C5 consistent with ligamentum flavum injury, and possible ligamentum flavum disruption. 4. Edema signal within the interspinous spaces at C3-C4, C4-C5, C5-C6, C6-C7 and C7-T1 consistent with interspinous ligament injury. 5. Thinned appearance of the anterior longitudinal ligament at the C3 level suspicious for anterior longitudinal ligament injury (without complete ligamentous disruption). 6. Prevertebral edema/hematoma spanning the C2-C6 levels. 7. Cervical spondylosis as outlined within the body of the report. Moderate spinal canal stenosis at C3-C4 and C4-C5. Multilevel foraminal stenosis, greatest bilaterally at C3-C4 (moderate/severe) and on the left at C4-C5 (moderate). 8. Vertebral body and left-sided facet ankylosis at C2-C3.  COGNITION: Overall cognitive status: Within functional limits for tasks assessed   SENSATION: Stocking numbness in BLEs   POSTURE: rounded shoulders and pt wearing cervical collar  LOWER EXTREMITY ROM:     Active  Right Eval Left Eval  Hip flexion    Hip extension    Hip abduction    Hip adduction    Hip internal rotation    Hip external rotation    Knee flexion    Knee extension    Ankle dorsiflexion    Ankle plantarflexion    Ankle inversion    Ankle eversion     (Blank rows = not tested)  LOWER EXTREMITY MMT:  Tested in seated position   MMT Right Eval Left Eval  Hip flexion 4- 4-  Hip extension    Hip abduction 5 5  Hip adduction 5 5  Hip internal rotation    Hip external rotation    Knee flexion 5 5  Knee extension 5 5  Ankle dorsiflexion 5 5  Ankle plantarflexion    Ankle inversion    Ankle eversion    (Blank rows = not tested)  BED MOBILITY:  Pt reports he needs more support to get out of his bed at home due to having to get out on the L side (was getting  out on the R side in the hospital) and has difficulty propping up on L shoulder due to pain  TRANSFERS: Assistive device utilized: Environmental consultant - 4 wheeled  Sit to stand: SBA Stand to sit: SBA Chair to chair: SBA Noted decreased anterior weight shift initially w/minor lateral shift to L side. Poor eccentric control w/fatigue   STAIRS: Level of Assistance: SBA Stair Negotiation Technique: Alternating Pattern  with Bilateral Rails Number of Stairs: 4  Height of Stairs: 6"  Comments: Decreased eccentric control on RLE   GAIT: Gait pattern: step through pattern, lateral hip instability, trunk flexed, and wide BOS Distance walked: Various clinic distances  Assistive device utilized: Environmental consultant - 4 wheeled and None Level of assistance: Modified independence and SBA Comments: Pt is mod I w/rollator and SBA w/no AD due to anterior instability and decreased visual scanning ability   VITALS:  Vitals:   12/16/23 1028 12/16/23 1030 12/16/23 1058  BP: (!) 112/58 (!) 86/54 121/70  Pulse: 61 68 (!) 55      CERVICAL ROM: tested in seated position, pt w/p! In all directions   Active ROM A/PROM (deg) 12/02/23  Flexion 24  Extension 15  Right lateral flexion 13  Left lateral flexion 8  Right rotation 11  Left rotation 21   (Blank rows = not tested)  TREATMENT :   Self-care/home management  Assessed vitals in seated and standing position in RUE (see above) and systolic BP did drop but diastolic did not. Pt denied symptoms of orthostatics but continued to monitor.    NMR At ballet bar, 5 Blaze pods on random setting for improved UE coordination, cervical ROM and ankle strategy.  Performed on 2 minute intervals with 2-3 minute rest periods.  Pt requires SBA-CGA guarding. Round 1:  on airex w/5 pods placed from 9-3 o'clock on mirror.  101 hits. Round 2:  on balance beam w/pods placed in horizontal line on  mirror.  65 hits. Notable errors/deficits:  min cues for pt to slow down and focus on foot placement, especially when on balance beam.  In // bars for improved stepping strategy, LE coordination and postural control:  On Rockerboard in A/P direction:  Alt retro step off board, x10 reps per side w/SBA  Alt fwd step off board, x10 reps per side w/CGA  At wall, serratus punch into yellow ball w/clockwise circles, x4 reps w/LUE. Pt became orthostatic w/activity and required CGA to safely sit down, reporting lightheadedness and near syncope.  Assessed vitals (see above) after several minutes of seated rest and WNL.    PATIENT EDUCATION: Education details: Continue HEP, brief education on allodynia  Person educated: Patient Education method: Medical illustrator Education comprehension: verbalized understanding and needs further education  HOME EXERCISE PROGRAM: From CIRDeronda Flesher; [id number]  Desensitization Techniques: -Light touch/pressure:  using fingertip pressure to slowly move up small segments of the affected body part until outside the area of pain and then back to where you started -Deep pressure:  using fingertips to squeeze in small segments starting outside the affected area, crossing over the affected area, and then to the other side of the affected area -Tapping:  fingertips tap with firm pressure over the affected area, can move around the affected area as well -Brushing/scratching:  use fingertips to brush/scratch over and around the affected area -Textures:  use soft and rough textured items like cotton balls vs wash cloths to brush or rub with light into firm pressure over and around the affected area  Repeat these 3-4x per day.   Verbally added 11/15/23: Supine serratus punch 3 x 10 reps Supine L biceps and ant delt stretch off EOB 3 x 30 sec Access Code: [email or web address] URL: [email or web address] Date: 11/22/2023 Prepared by: Burleigh Carp Tyshika Baldridge  Exercises -  Seated Hamstring Stretch  - 1 x daily - 7 x weekly - 1-2 reps - 60-90 seconds hold - Soleus Stretch on Wall  - 1 x daily - 7 x weekly - 60-90 seconds hold - Staggered Stance Squat  - 1 x daily - 7 x weekly - 3 sets - 10 reps - Supine Cervical Retraction with Towel  - 1 x daily - 7 x weekly - 3 sets - 10 reps - Supine Cervical Circles on Pillow  - 1 x daily - 7 x weekly - 3 sets - 10 reps  Verbally added on 12/09/23:  - Fwd/retro/lat monster walks w/green resistance band  - Prone or modified plantigrade cervical extension w/2-3s hold   Verbally added on 12/13/23: - mod plantigrade bird dogs  -serratus wall pushes   GOALS: Goals reviewed with patient? Yes  SHORT TERM GOALS: Target date: 11/09/2023   Pt will be independent with initial HEP for improved strength, balance, transfers and gait. Baseline: Goal status: MET  2.  MCTSIB to be assessed and STG/LTG updated ;  hold condition 4 on mCTSIB with minimal to no postural sway for improved balance. (SD) Baseline: 30 sec hold with moderate postural sway, 30 sec with mild postural sway (5/5) Goal status: MET  3.  to be assessed and LTG updated  Baseline: 1282.10' Goal status: Goal met 10-18-23  4.  Modified FGA to be assessed and LTG updated  Baseline: 22/30 Goal status: MET  LONG TERM GOALS: Target date: 11/23/2023   Patient will score >/=25/30 (accounting for not being able to complete head turns) on FGA to demonstrate improved walking balance Baseline: 22/30 Goal status: IN PROGRESS  2.  goal - increase distance to >1400' for increased endurance/gait efficiency. (SD) Baseline:  Goal status: DC  3.  MCTSIB goal -  hold condition 4 on mCTSIB with minimal to no postural sway for improved balance. (SD) Baseline:  Goal status: MET  4.  Pt will return to walking dog on trail w/SBA and no instability for return to PLOF and improved independence  Baseline:  Goal status: MET  NEW SHORT TERM GOALS:   Target date:  12/27/2023  Patient will score >/=25/30 (accounting for not being able to complete head turns) on FGA to demonstrate improved walking balance Baseline: 22/30 Goal status: IN PROGRESS  2.  Pt will improve cervical A/ROM by 10 degrees in all planes for improved functional use of cervical spine and return to driving  Baseline: Assessed on 5/22  Goal status: REVISED  3.  MiniBest to be assessed and LTG updated  Baseline:  Goal status: INITIAL   NEW LONG TERM GOALS:  Target date: 01/10/2024  Pt will improve both left and right cervical rotation to > 40 degrees for improved functional use of cervical spine and return to driving  Baseline: 11 degrees R, 21 degrees L Goal status: REVISED  2.  MiniBest goal  Baseline:  Goal status: INITIAL   ASSESSMENT:  CLINICAL IMPRESSION: Emphasis of skilled PT session on postural control, UE coordination, stepping strategies and gentle cervical A/ROM. Pt more limited by orthostatic hypotension today, requiring assistance to safely sit at end of session. Pt does tend to move too quickly w/blaze pod activity, resulting in instability due to poor foot placement on unlevel surface. Pt most challenged by alt fwd step off rocker board due to poor hip strategy, but did improve by end of activity.  Continue POC.    OBJECTIVE IMPAIRMENTS: Abnormal gait, decreased activity tolerance, decreased balance, decreased coordination, decreased endurance, decreased mobility, decreased strength, dizziness, impaired sensation, impaired UE functional use, improper body mechanics, and pain  ACTIVITY LIMITATIONS: carrying, lifting, bending, squatting, transfers, bed mobility, toileting, dressing, reach over head, hygiene/grooming, locomotion level, and caring for others  PARTICIPATION LIMITATIONS: meal prep, cleaning, laundry, medication management, driving, shopping, community activity, occupation, and yard work  PERSONAL FACTORS: Fitness, Past/current experiences, and 1  comorbidity: quadriplegia are also affecting patient's functional outcome.   REHAB POTENTIAL: Excellent  CLINICAL DECISION MAKING: Evolving/moderate complexity  EVALUATION COMPLEXITY: Moderate  PLAN:  PT FREQUENCY: 2x/week  PT DURATION: 6 weeks + 6 weeks (recert)  PLANNED INTERVENTIONS: 14782- PT Re-evaluation, 97110-Therapeutic exercises, 97530- Therapeutic activity, 97112- Neuromuscular re-education, 97535- Self Care, 95621- Manual therapy, [phone]- Gait training, [phone]- Orthotic Fit/training, [phone]- Aquatic Therapy, [phone]- Electrical stimulation (manual), Patient/Family education, Balance training, Stair training, Dry Needling, Joint mobilization, Spinal mobilization, Vestibular training, and DME instructions  PLAN FOR NEXT SESSION: Monitor vitals- pt very hypotensive. Work on return to hiking; NBOS, eyes closed, eccentric control, endurance, response to DN? L  shoulder periscapular strength, does he want to schedule again with TT for DN of his L biceps and ant delt? Posterior stability (pt loses balance w/toe raises), cervical ROM.    Ry Moody E Jordayn Mink, PT, DPT 12/16/2023, 12:02 PM

## 2023-12-17 ENCOUNTER — Encounter: Payer: Self-pay | Admitting: Internal Medicine

## 2023-12-26 ENCOUNTER — Encounter: Payer: Self-pay | Admitting: Physical Medicine and Rehabilitation

## 2023-12-28 ENCOUNTER — Ambulatory Visit: Admitting: Physical Therapy

## 2023-12-28 VITALS — BP 111/72 | HR 63

## 2023-12-28 DIAGNOSIS — G8929 Other chronic pain: Secondary | ICD-10-CM | POA: Diagnosis not present

## 2023-12-28 DIAGNOSIS — R2681 Unsteadiness on feet: Secondary | ICD-10-CM

## 2023-12-28 DIAGNOSIS — M542 Cervicalgia: Secondary | ICD-10-CM | POA: Diagnosis not present

## 2023-12-28 DIAGNOSIS — R29818 Other symptoms and signs involving the nervous system: Secondary | ICD-10-CM | POA: Diagnosis not present

## 2023-12-28 DIAGNOSIS — H2512 Age-related nuclear cataract, left eye: Secondary | ICD-10-CM | POA: Diagnosis not present

## 2023-12-28 DIAGNOSIS — M6281 Muscle weakness (generalized): Secondary | ICD-10-CM

## 2023-12-28 DIAGNOSIS — R278 Other lack of coordination: Secondary | ICD-10-CM | POA: Diagnosis not present

## 2023-12-28 DIAGNOSIS — Z961 Presence of intraocular lens: Secondary | ICD-10-CM | POA: Diagnosis not present

## 2023-12-28 DIAGNOSIS — H401132 Primary open-angle glaucoma, bilateral, moderate stage: Secondary | ICD-10-CM | POA: Diagnosis not present

## 2023-12-28 DIAGNOSIS — H04123 Dry eye syndrome of bilateral lacrimal glands: Secondary | ICD-10-CM | POA: Diagnosis not present

## 2023-12-28 DIAGNOSIS — M25512 Pain in left shoulder: Secondary | ICD-10-CM | POA: Diagnosis not present

## 2023-12-28 DIAGNOSIS — R2689 Other abnormalities of gait and mobility: Secondary | ICD-10-CM | POA: Diagnosis not present

## 2023-12-28 DIAGNOSIS — R208 Other disturbances of skin sensation: Secondary | ICD-10-CM | POA: Diagnosis not present

## 2023-12-28 NOTE — Therapy (Signed)
 OUTPATIENT PHYSICAL THERAPY NEURO TREATMENT   Patient Name: Brandon Watts MRN: [id number] DOB:1945/09/08, 78 y.o., male Today's Date: 12/28/2023   PCP: Colene Dauphin, MD REFERRING PROVIDER: Sterling Eisenmenger, PA-C   END OF SESSION:  PT End of Session - 12/28/23 1019     Visit Number 18    Number of Visits 25   Recert   Date for PT Re-Evaluation 01/17/24   recert   Authorization Type [employer]    Authorization Time Period 4 approved visits (PT) 6/17-7/15/25    PT Start Time 1018    PT Stop Time 1101    PT Time Calculation (min) 43 min    Equipment Utilized During Treatment Gait belt   TED hose   Activity Tolerance Patient tolerated treatment well    Behavior During Therapy WFL for tasks assessed/performed                   Past Medical History:  Diagnosis Date   Allergic rhinitis    Arthritis    PAIN AND OA RIGHT HIP   Arthritis    BPH (benign prostatic hyperplasia)    DDD (degenerative disc disease), lumbar    MILD-FOUND ON XRAY   Glaucoma    normotensive type with early changes   Glaucoma    Hepatitis    HEPATITIS C -CLEARED   Hepatitis    Hep C, treated and cleared   HLD (hyperlipidemia)    taking statin as preventative   Hypertension    Insomnia    CHRONIC   Insomnia    Positive PPD, treated    Pulmonary nodules    FOLLOWED BY DR. Armida Berry GATES WITH CT EXAMS-MOST RECENT 01/22/12   Status post trigger finger release    right   Past Surgical History:  Procedure Laterality Date   ANTERIOR CERVICAL DECOMP/DISCECTOMY FUSION N/A 09/09/2023   Procedure: ANTERIOR CERVICAL DECOMPRESSION/DISCECTOMY FUSION 2 LEVEL/HARDWARE REMOVAL - C 3-4 , C4-5;  Surgeon: Van Gelinas, MD;  Location: [facility];  Service: Neurosurgery;  Laterality: N/A;   EYE SURGERY     LASIK EYE SURGERY   LITHOTRIPSY     x 3   LITHOTRIPSY     x2   TONSILLECTOMY     AS A CHILD   TONSILLECTOMY     TOTAL HIP ARTHROPLASTY Right 10/25/2012   Procedure: RIGHT TOTAL HIP  ARTHROPLASTY ANTERIOR APPROACH;  Surgeon: Bevin Bucks, MD;  Location: [facility];  Service: Orthopedics;  Laterality: Right;   TOTAL HIP ARTHROPLASTY Right    TRIGGER FINGER RELEASE Right 2016   third digit right hand. Dr.Supple   URETERAL STONE EXTRACTION S X 2     VASCECTOMY     Patient Active Problem List   Diagnosis Date Noted   Hypotension 10/22/2023   Gross hematuria 10/22/2023   Constipation 10/22/2023   Quadriplegia, C1-C4, incomplete (HCC) 09/18/2023   C4 cervical fracture (HCC) 09/17/2023   Central cord syndrome (HCC) 09/17/2023   Dysphagia 09/17/2023   Respiratory failure with hypoxia (HCC) 09/17/2023   SDH (subdural hematoma) (HCC) 09/17/2023   Malnutrition of moderate degree 09/15/2023   Acute pulmonary embolism without acute cor pulmonale (HCC) 09/15/2023   Post-nasal drainage 09/15/2023   Pneumonia of left lower lobe due to infectious organism 09/15/2023   Hyperlipidemia 09/08/2023   Diastolic dysfunction without heart failure 09/17/2022   Episodic lightheadedness 08/26/2022   Vasovagal near syncope 08/26/2022   Hypercalcemia 08/25/2022   Hyperglycemia 08/25/2022   Acute left-sided low  back pain with left-sided sciatica 10/13/2021   Left renal mass 10/12/2021   Performance anxiety 08/17/2021   Glaucoma 08/14/2021   Insomnia 08/14/2021   Hyperlipoproteinemia 08/14/2021   BPH (benign prostatic hyperplasia) 08/14/2021   Erectile dysfunction 08/14/2021   Trigger finger, acquired 07/11/2013   S/P right THA, AA 10/25/2012   BACK PAIN, LUMBAR 01/22/2010   NONSPEC REACT TUBERCULIN SKN TEST W/O ACTV TB 06/13/2009   HEPATITIS C 02/12/2007   ALLERGIC RHINITIS 02/12/2007   History of colonic polyps 02/12/2007    ONSET DATE: 10/08/2023 (referral)   REFERRING DIAG: G82.52 (ICD-10-CM) - Quadriplegia, C1-C4 incomplete S12.300A (ICD-10-CM) - Unspecified displaced fracture of fourth cervical vertebra, initial encounter for closed fracture S14.129A (ICD-10-CM) - Central cord  syndrome at unspecified level of cervical spinal cord, initial encounter  THERAPY DIAG:  Other abnormalities of gait and mobility  Muscle weakness (generalized)  Cervicalgia  Unsteadiness on feet  Chronic left shoulder pain  Rationale for Evaluation and Treatment: Rehabilitation  SUBJECTIVE:                                                                                                                                                                                             SUBJECTIVE STATEMENT: Pt presents without cervical collar. Just got back from vacation, had a few bad pain days and does not know why. R shoulder is starting to bother him, he is having difficulty throwing the Chuck-It with his dog.    Pt accompanied by: Self   PERTINENT HISTORY: HTN, HLD, BPH, Glaucoma, Hx of hep C s/p treatment  PAIN:  Are you having pain? Yes: NPRS scale: 2/10  Pain location: Bilateral shoulder girdles L > R  Pain description: Achy  Aggravating factors: Moving arm Relieving factors: Pain meds  PRECAUTIONS: Cervical, Fall, and Other: No lifting over 5#, foley catheter  RED FLAGS: None   WEIGHT BEARING RESTRICTIONS: No  FALLS: Has patient fallen in last 6 months? No  LIVING ENVIRONMENT: Lives with: lives with their spouse Lives in: House/apartment Stairs: Yes: Internal: 16 steps; on right going up, on left going up, and can reach both and External: 3 + 1 steps; on right going up, on left going up, and can reach both Has following equipment at home: Otho Blitz - 4 wheeled, Shower bench, and bed side commode  PLOF: Independent  PATIENT GOALS: To be able to hike in the [place] in August and in [place] in 07/2024. I would like to be able to play the cello again and walk my dog along the trails in about a month   OBJECTIVE:  Note: Objective measures were completed at Evaluation  unless otherwise noted.  DIAGNOSTIC FINDINGS: MRI of C-spine from 09/08/2023  IMPRESSION: 1.  Signal abnormality within the ventral spinal cord at the C3-C4 level likely reflecting cord contusion. 2. Edema at site of a known acute fracture within the C4 spinous process. 3. Ligamentum flavum irregularity at C4-C5 consistent with ligamentum flavum injury, and possible ligamentum flavum disruption. 4. Edema signal within the interspinous spaces at C3-C4, C4-C5, C5-C6, C6-C7 and C7-T1 consistent with interspinous ligament injury. 5. Thinned appearance of the anterior longitudinal ligament at the C3 level suspicious for anterior longitudinal ligament injury (without complete ligamentous disruption). 6. Prevertebral edema/hematoma spanning the C2-C6 levels. 7. Cervical spondylosis as outlined within the body of the report. Moderate spinal canal stenosis at C3-C4 and C4-C5. Multilevel foraminal stenosis, greatest bilaterally at C3-C4 (moderate/severe) and on the left at C4-C5 (moderate). 8. Vertebral body and left-sided facet ankylosis at C2-C3.  COGNITION: Overall cognitive status: Within functional limits for tasks assessed   SENSATION: Stocking numbness in BLEs   POSTURE: rounded shoulders and pt wearing cervical collar  LOWER EXTREMITY ROM:     Active  Right Eval Left Eval  Hip flexion    Hip extension    Hip abduction    Hip adduction    Hip internal rotation    Hip external rotation    Knee flexion    Knee extension    Ankle dorsiflexion    Ankle plantarflexion    Ankle inversion    Ankle eversion     (Blank rows = not tested)  LOWER EXTREMITY MMT:  Tested in seated position   MMT Right Eval Left Eval  Hip flexion 4- 4-  Hip extension    Hip abduction 5 5  Hip adduction 5 5  Hip internal rotation    Hip external rotation    Knee flexion 5 5  Knee extension 5 5  Ankle dorsiflexion 5 5  Ankle plantarflexion    Ankle inversion    Ankle eversion    (Blank rows = not tested)  BED MOBILITY:  Pt reports he needs more support to get out of his bed at  home due to having to get out on the L side (was getting out on the R side in the hospital) and has difficulty propping up on L shoulder due to pain  TRANSFERS: Assistive device utilized: Environmental consultant - 4 wheeled  Sit to stand: SBA Stand to sit: SBA Chair to chair: SBA Noted decreased anterior weight shift initially w/minor lateral shift to L side. Poor eccentric control w/fatigue   STAIRS: Level of Assistance: SBA Stair Negotiation Technique: Alternating Pattern  with Bilateral Rails Number of Stairs: 4  Height of Stairs: 6  Comments: Decreased eccentric control on RLE   GAIT: Gait pattern: step through pattern, lateral hip instability, trunk flexed, and wide BOS Distance walked: Various clinic distances  Assistive device utilized: Environmental consultant - 4 wheeled and None Level of assistance: Modified independence and SBA Comments: Pt is mod I w/rollator and SBA w/no AD due to anterior instability and decreased visual scanning ability   VITALS:  Vitals:   12/28/23 1029  BP: 111/72  Pulse: 63       CERVICAL ROM: tested in seated position, pt w/p! In all directions   Active ROM A/ROM (deg) 12/02/23 A/ROM (deg)  12/28/23  Flexion 24 30  Extension 15 30  Right lateral flexion 13 28  Left lateral flexion 8 19  Right rotation 11 23  Left rotation 21 30   (Blank rows =  not tested)                                                                                                TREATMENT :   Self-care/home management  Assessed vitals in seated position in RUE (see above) and WNL  Physical Performance - STG Assessment   [facility] PT Assessment - 12/28/23 1035       Balance   Balance Assessed Yes      Standardized Balance Assessment   Standardized Balance Assessment Mini-BESTest;Timed Up and Go Test      Mini-BESTest   Sit To Stand Normal: Comes to stand without use of hands and stabilizes independently.    Rise to Toes Moderate: Heels up, but not full range (smaller than when holding  hands), OR noticeable instability for 3 s.   Not full range, posterior instability   Stand on one leg (left) Moderate: < 20 s   7.63s   Stand on one leg (right) Moderate: < 20 s   13.5s   Stand on one leg - lowest score 1    Compensatory Stepping Correction - Forward Normal: Recovers independently with a single, large step (second realignement is allowed).    Compensatory Stepping Correction - Backward Normal: Recovers independently with a single, large step    Compensatory Stepping Correction - Left Lateral Severe: Falls, or cannot step    Compensatory Stepping Correction - Right Lateral Moderate: Several steps to recover equilibrium    Stepping Corredtion Lateral - lowest score 0    Stance - Feet together, eyes open, firm surface  Normal: 30s    Stance - Feet together, eyes closed, foam surface  Normal: 30s   moderate A/P sway to L   Incline - Eyes Closed Normal: Stands independently 30s and aligns with gravity   Posterior lean   Change in Gait Speed Normal: Significantly changes walkling speed without imbalance    Walk with head turns - Horizontal Moderate: performs head turns with reduction in gait speed.    Walk with pivot turns Normal: Turns with feet close FAST (< 3 steps) with good balance.    Step over obstacles Normal: Able to step over box with minimal change of gait speed and with good balance.    Timed UP & GO with Dual Task Moderate: Dual Task affects either counting OR walking (>10%) when compared to the TUG without Dual Task.    Mini-BEST total score 22      Timed Up and Go Test   Normal TUG (seconds) 7.38    Cognitive TUG (seconds) 12.37      Functional Gait  Assessment   Gait assessed  Yes    Gait Level Surface Walks 20 ft in less than 7 sec but greater than 5.5 sec, uses assistive device, slower speed, mild gait deviations, or deviates 6-10 in outside of the 12 in walkway width.   5.72s   Change in Gait Speed Able to smoothly change walking speed without loss of balance  or gait deviation. Deviate no more than 6 in outside of the 12 in walkway width.  Gait with Horizontal Head Turns Performs head turns smoothly with slight change in gait velocity (eg, minor disruption to smooth gait path), deviates 6-10 in outside 12 in walkway width, or uses an assistive device.    Gait with Vertical Head Turns Performs task with slight change in gait velocity (eg, minor disruption to smooth gait path), deviates 6 - 10 in outside 12 in walkway width or uses assistive device    Gait and Pivot Turn Pivot turns safely within 3 sec and stops quickly with no loss of balance.    Step Over Obstacle Is able to step over 2 stacked shoe boxes taped together (9 in total height) without changing gait speed. No evidence of imbalance.    Gait with Narrow Base of Support Is able to ambulate for 10 steps heel to toe with no staggering.    Gait with Eyes Closed Walks 20 ft, no assistive devices, good speed, no evidence of imbalance, normal gait pattern, deviates no more than 6 in outside 12 in walkway width. Ambulates 20 ft in less than 7 sec.   6.72s   Ambulating Backwards Walks 20 ft, no assistive devices, good speed, no evidence for imbalance, normal gait    Steps Alternating feet, no rail.    Total Score 27         Ther Act  Assessed cervical A/ROM in seated position (see above) and pt improved in all planes but continues to be most limited with cervical flexion.    PATIENT EDUCATION: Education details: Continue HEP, FGA and minibest results  Person educated: Patient Education method: Explanation and Demonstration Education comprehension: verbalized understanding and needs further education  HOME EXERCISE PROGRAM: From CIRDeronda Flesher; [id number]  Desensitization Techniques: -Light touch/pressure:  using fingertip pressure to slowly move up small segments of the affected body part until outside the area of pain and then back to where you started -Deep pressure:  using fingertips to  squeeze in small segments starting outside the affected area, crossing over the affected area, and then to the other side of the affected area -Tapping:  fingertips tap with firm pressure over the affected area, can move around the affected area as well -Brushing/scratching:  use fingertips to brush/scratch over and around the affected area -Textures:  use soft and rough textured items like cotton balls vs wash cloths to brush or rub with light into firm pressure over and around the affected area  Repeat these 3-4x per day.   Verbally added 11/15/23: Supine serratus punch 3 x 10 reps Supine L biceps and ant delt stretch off EOB 3 x 30 sec Access Code: [email or web address] URL: [email or web address] Date: 11/22/2023 Prepared by: Burleigh Carp Uziel Covault  Exercises - Seated Hamstring Stretch  - 1 x daily - 7 x weekly - 1-2 reps - 60-90 seconds hold - Soleus Stretch on Wall  - 1 x daily - 7 x weekly - 60-90 seconds hold - Staggered Stance Squat  - 1 x daily - 7 x weekly - 3 sets - 10 reps - Supine Cervical Retraction with Towel  - 1 x daily - 7 x weekly - 3 sets - 10 reps - Supine Cervical Circles on Pillow  - 1 x daily - 7 x weekly - 3 sets - 10 reps  Verbally added on 12/09/23:  - Fwd/retro/lat monster walks w/green resistance band  - Prone or modified plantigrade cervical extension w/2-3s hold   Verbally added on 12/13/23: - mod plantigrade bird dogs  -serratus wall pushes   GOALS:  Goals reviewed with patient? Yes  SHORT TERM GOALS: Target date: 11/09/2023   Pt will be independent with initial HEP for improved strength, balance, transfers and gait. Baseline: Goal status: MET  2.  MCTSIB to be assessed and STG/LTG updated ;  hold condition 4 on mCTSIB with minimal to no postural sway for improved balance. (SD) Baseline: 30 sec hold with moderate postural sway, 30 sec with mild postural sway (5/5) Goal status: MET  3.  to be assessed and LTG updated  Baseline: 1282.10' Goal status:  Goal met 10-18-23  4.  Modified FGA to be assessed and LTG updated  Baseline: 22/30 Goal status: MET  LONG TERM GOALS: Target date: 11/23/2023   Patient will score >/=25/30 (accounting for not being able to complete head turns) on FGA to demonstrate improved walking balance Baseline: 22/30 Goal status: IN PROGRESS  2.  goal - increase distance to >1400' for increased endurance/gait efficiency. (SD) Baseline:  Goal status: DC  3.  MCTSIB goal -  hold condition 4 on mCTSIB with minimal to no postural sway for improved balance. (SD) Baseline:  Goal status: MET  4.  Pt will return to walking dog on trail w/SBA and no instability for return to PLOF and improved independence  Baseline:  Goal status: MET  NEW SHORT TERM GOALS:   Target date: 12/27/2023  Patient will score >/=25/30 (accounting for not being able to complete head turns) on FGA to demonstrate improved walking balance Baseline: 22/30; 27/30  Goal status: MET  2.  Pt will improve cervical A/ROM by 10 degrees in all planes for improved functional use of cervical spine and return to driving  Baseline: Assessed on 5/22; see chart above  Goal status: PARTIALLY MET  3.  MiniBest to be assessed and LTG updated  Baseline:  Goal status: MET   NEW LONG TERM GOALS:  Target date: 01/10/2024  Pt will improve both left and right cervical rotation to > 40 degrees for improved functional use of cervical spine and return to driving  Baseline: 11 degrees R, 21 degrees L Goal status: REVISED  2.  Pt will improve MiniBest to 24/28 for decreased fall risk and improvement with compensatory stepping strategies.   Baseline: 22/28 Goal status: REVISED   ASSESSMENT:  CLINICAL IMPRESSION: Emphasis of skilled PT session on LTG assessment and balance assessment via MiniBest. Pt scored a 27/30 on FGA, indicative of low fall risk and has reached ceiling effect for test so assessed MiniBest to further assess balance. Pt scored a 22/28 on  Minibest and was most challenged by single leg stability and stepping strategies in lateral directions. Pt has mild instability w/head turns but no overt LOB. Pt did improve his cervical A/ROM in all planes but continues to be most limited w/cervical flexion. Pt did improve ROM by ~10 degrees in all planes except for cervical flexion. Pt to reduce PT frequency to 1x/week as he was only approved for 4 visits. Continue POC.    OBJECTIVE IMPAIRMENTS: Abnormal gait, decreased activity tolerance, decreased balance, decreased coordination, decreased endurance, decreased mobility, decreased strength, dizziness, impaired sensation, impaired UE functional use, improper body mechanics, and pain  ACTIVITY LIMITATIONS: carrying, lifting, bending, squatting, transfers, bed mobility, toileting, dressing, reach over head, hygiene/grooming, locomotion level, and caring for others  PARTICIPATION LIMITATIONS: meal prep, cleaning, laundry, medication management, driving, shopping, community activity, occupation, and yard work  PERSONAL FACTORS: Fitness, Past/current experiences, and 1 comorbidity: quadriplegia are also affecting patient's functional outcome.   REHAB  POTENTIAL: Excellent  CLINICAL DECISION MAKING: Evolving/moderate complexity  EVALUATION COMPLEXITY: Moderate  PLAN:  PT FREQUENCY: 2x/week  PT DURATION: 6 weeks + 6 weeks (recert)  PLANNED INTERVENTIONS: 91478- PT Re-evaluation, 97110-Therapeutic exercises, 97530- Therapeutic activity, 97112- Neuromuscular re-education, 97535- Self Care, 29562- Manual therapy, [phone]- Gait training, [phone]- Orthotic Fit/training, [phone]- Aquatic Therapy, [phone]- Electrical stimulation (manual), Patient/Family education, Balance training, Stair training, Dry Needling, Joint mobilization, Spinal mobilization, Vestibular training, and DME instructions  PLAN FOR NEXT SESSION: Monitor vitals- pt very hypotensive. Work on return to hiking; NBOS, eyes closed, eccentric  control, endurance, response to DN? L shoulder periscapular strength, does he want to schedule again with TT for DN of his L biceps and ant delt? Posterior stability (pt loses balance w/toe raises), cervical ROM.    Sabah Zucco E Timmia Cogburn, PT, DPT 12/28/2023, 11:01 AM

## 2023-12-29 ENCOUNTER — Ambulatory Visit: Admitting: Occupational Therapy

## 2023-12-29 DIAGNOSIS — R208 Other disturbances of skin sensation: Secondary | ICD-10-CM | POA: Diagnosis not present

## 2023-12-29 DIAGNOSIS — G8929 Other chronic pain: Secondary | ICD-10-CM | POA: Diagnosis not present

## 2023-12-29 DIAGNOSIS — M542 Cervicalgia: Secondary | ICD-10-CM | POA: Diagnosis not present

## 2023-12-29 DIAGNOSIS — M6281 Muscle weakness (generalized): Secondary | ICD-10-CM | POA: Diagnosis not present

## 2023-12-29 DIAGNOSIS — R29818 Other symptoms and signs involving the nervous system: Secondary | ICD-10-CM | POA: Diagnosis not present

## 2023-12-29 DIAGNOSIS — M25512 Pain in left shoulder: Secondary | ICD-10-CM | POA: Diagnosis not present

## 2023-12-29 DIAGNOSIS — R2681 Unsteadiness on feet: Secondary | ICD-10-CM | POA: Diagnosis not present

## 2023-12-29 DIAGNOSIS — R2689 Other abnormalities of gait and mobility: Secondary | ICD-10-CM | POA: Diagnosis not present

## 2023-12-29 DIAGNOSIS — R278 Other lack of coordination: Secondary | ICD-10-CM

## 2023-12-29 NOTE — Patient Instructions (Addendum)
 Access Code: [email or web address] URL: [email or web address] Date: 12/29/2023 Prepared by: Sudie Ely  Exercises - Isometric Shoulder Extension at Wall  - 1 x daily - 10 reps - 5-10 hold - Isometric Shoulder Abduction at Wall  - 1 x daily - 10 reps - 5-10 hold - Standing Isometric Shoulder External Rotation with Doorway  - 1 x daily - 10 reps - 5-10 hold
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
## 2023-12-30 ENCOUNTER — Encounter: Payer: Self-pay | Admitting: Internal Medicine

## 2023-12-30 ENCOUNTER — Other Ambulatory Visit: Payer: Self-pay | Admitting: Internal Medicine

## 2023-12-30 ENCOUNTER — Ambulatory Visit: Admitting: Physical Therapy

## 2023-12-30 DIAGNOSIS — R5383 Other fatigue: Secondary | ICD-10-CM

## 2023-12-30 DIAGNOSIS — R82998 Other abnormal findings in urine: Secondary | ICD-10-CM

## 2023-12-31 ENCOUNTER — Encounter: Payer: Self-pay | Admitting: Internal Medicine

## 2023-12-31 ENCOUNTER — Ambulatory Visit: Payer: Self-pay | Admitting: Internal Medicine

## 2023-12-31 ENCOUNTER — Other Ambulatory Visit (INDEPENDENT_AMBULATORY_CARE_PROVIDER_SITE_OTHER)

## 2023-12-31 DIAGNOSIS — R5383 Other fatigue: Secondary | ICD-10-CM

## 2023-12-31 DIAGNOSIS — R82998 Other abnormal findings in urine: Secondary | ICD-10-CM | POA: Diagnosis not present

## 2023-12-31 LAB — CBC WITH DIFFERENTIAL/PLATELET
Basophils Absolute: 0 10*3/uL (ref 0.0–0.1)
Basophils Relative: 0.5 % (ref 0.0–3.0)
Eosinophils Absolute: 0.2 10*3/uL (ref 0.0–0.7)
Eosinophils Relative: 2.6 % (ref 0.0–5.0)
HCT: 39.3 % (ref 39.0–52.0)
Hemoglobin: 13.5 g/dL (ref 13.0–17.0)
Lymphocytes Relative: 33.2 % (ref 12.0–46.0)
Lymphs Abs: 3 10*3/uL (ref 0.7–4.0)
MCHC: 34.4 g/dL (ref 30.0–36.0)
MCV: 94.1 fl (ref 78.0–100.0)
Monocytes Absolute: 0.7 10*3/uL (ref 0.1–1.0)
Monocytes Relative: 7.9 % (ref 3.0–12.0)
Neutro Abs: 5 10*3/uL (ref 1.4–7.7)
Neutrophils Relative %: 55.8 % (ref 43.0–77.0)
Platelets: 167 10*3/uL (ref 150.0–400.0)
RBC: 4.18 Mil/uL — ABNORMAL LOW (ref 4.22–5.81)
RDW: 13.1 % (ref 11.5–15.5)
WBC: 8.9 10*3/uL (ref 4.0–10.5)

## 2023-12-31 LAB — COMPREHENSIVE METABOLIC PANEL WITH GFR
ALT: 9 U/L (ref 0–53)
AST: 15 U/L (ref 0–37)
Albumin: 4.2 g/dL (ref 3.5–5.2)
Alkaline Phosphatase: 57 U/L (ref 39–117)
BUN: 23 mg/dL (ref 6–23)
CO2: 28 meq/L (ref 19–32)
Calcium: 10.7 mg/dL — ABNORMAL HIGH (ref 8.4–10.5)
Chloride: 104 meq/L (ref 96–112)
Creatinine, Ser: 0.87 mg/dL (ref 0.40–1.50)
GFR: 83.67 mL/min (ref >60.00)
Glucose, Bld: 93 mg/dL (ref 70–99)
Potassium: 4.2 meq/L (ref 3.5–5.1)
Sodium: 139 meq/L (ref 135–145)
Total Bilirubin: 0.8 mg/dL (ref 0.2–1.2)
Total Protein: 6.7 g/dL (ref 6.0–8.3)

## 2023-12-31 LAB — URINALYSIS, ROUTINE W REFLEX MICROSCOPIC
Bilirubin Urine: NEGATIVE
Ketones, ur: NEGATIVE
Nitrite: POSITIVE — AB
Specific Gravity, Urine: 1.01 (ref 1.000–1.030)
Total Protein, Urine: 100 — AB
Urine Glucose: NEGATIVE
Urobilinogen, UA: 4 — AB (ref 0.0–1.0)
pH: 5.5 (ref 5.0–8.0)

## 2023-12-31 MED ORDER — CIPROFLOXACIN HCL 500 MG PO TABS: 500.0000 mg | ORAL_TABLET | Freq: Two times a day (BID) | ORAL | 0 refills | Status: AC

## 2024-01-01 LAB — URINE CULTURE
MICRO NUMBER:: 16606087
SPECIMEN QUALITY:: ADEQUATE

## 2024-01-03 ENCOUNTER — Ambulatory Visit: Admitting: Physical Therapy

## 2024-01-03 DIAGNOSIS — M6281 Muscle weakness (generalized): Secondary | ICD-10-CM

## 2024-01-03 DIAGNOSIS — R2689 Other abnormalities of gait and mobility: Secondary | ICD-10-CM

## 2024-01-03 NOTE — Therapy (Signed)
 OUTPATIENT PHYSICAL THERAPY NEURO TREATMENT - ARRIVED NO CHARGE  Patient Name: Brandon Watts MRN: [id number] DOB:10/06/1945, 78 y.o., male Today's Date: 01/03/2024   PCP: Geofm Glade PARAS, MD REFERRING PROVIDER: Pegge Toribio PARAS, PA-C   END OF SESSION:  PT End of Session - 01/03/24 1021     Visit Number 18   Arrived no charge   Number of Visits 25   Recert   Date for PT Re-Evaluation 01/17/24   recert   Authorization Type [employer]    Authorization Time Period 4 approved visits (PT) 6/17-7/15/25    PT Start Time 1019   Pt arrived late   PT Stop Time 1041   Arrived no charge   PT Time Calculation (min) 22 min    Equipment Utilized During Treatment --   TED hose   Activity Tolerance Patient limited by pain    Behavior During Therapy WFL for tasks assessed/performed                    Past Medical History:  Diagnosis Date   Allergic rhinitis    Arthritis    PAIN AND OA RIGHT HIP   Arthritis    BPH (benign prostatic hyperplasia)    DDD (degenerative disc disease), lumbar    MILD-FOUND ON XRAY   Glaucoma    normotensive type with early changes   Glaucoma    Hepatitis    HEPATITIS C -CLEARED   Hepatitis    Hep C, treated and cleared   HLD (hyperlipidemia)    taking statin as preventative   Hypertension    Insomnia    CHRONIC   Insomnia    Positive PPD, treated    Pulmonary nodules    FOLLOWED BY DR. LAMAR BROCKS GATES WITH CT EXAMS-MOST RECENT 01/22/12   Status post trigger finger release    right   Past Surgical History:  Procedure Laterality Date   ANTERIOR CERVICAL DECOMP/DISCECTOMY FUSION N/A 09/09/2023   Procedure: ANTERIOR CERVICAL DECOMPRESSION/DISCECTOMY FUSION 2 LEVEL/HARDWARE REMOVAL - C 3-4 , C4-5;  Surgeon: Debby Dorn MATSU, MD;  Location: [facility];  Service: Neurosurgery;  Laterality: N/A;   EYE SURGERY     LASIK EYE SURGERY   LITHOTRIPSY     x 3   LITHOTRIPSY     x2   TONSILLECTOMY     AS A CHILD   TONSILLECTOMY     TOTAL HIP  ARTHROPLASTY Right 10/25/2012   Procedure: RIGHT TOTAL HIP ARTHROPLASTY ANTERIOR APPROACH;  Surgeon: Donnice JONETTA Car, MD;  Location: [facility];  Service: Orthopedics;  Laterality: Right;   TOTAL HIP ARTHROPLASTY Right    TRIGGER FINGER RELEASE Right 2016   third digit right hand. Dr.Supple   URETERAL STONE EXTRACTION S X 2     VASCECTOMY     Patient Active Problem List   Diagnosis Date Noted   Hypotension 10/22/2023   Gross hematuria 10/22/2023   Constipation 10/22/2023   Quadriplegia, C1-C4, incomplete (HCC) 09/18/2023   C4 cervical fracture (HCC) 09/17/2023   Central cord syndrome (HCC) 09/17/2023   Dysphagia 09/17/2023   Respiratory failure with hypoxia (HCC) 09/17/2023   SDH (subdural hematoma) (HCC) 09/17/2023   Malnutrition of moderate degree 09/15/2023   Acute pulmonary embolism without acute cor pulmonale (HCC) 09/15/2023   Post-nasal drainage 09/15/2023   Pneumonia of left lower lobe due to infectious organism 09/15/2023   Hyperlipidemia 09/08/2023   Diastolic dysfunction without heart failure 09/17/2022   Episodic lightheadedness 08/26/2022   Vasovagal near  syncope 08/26/2022   Hypercalcemia 08/25/2022   Hyperglycemia 08/25/2022   Acute left-sided low back pain with left-sided sciatica 10/13/2021   Left renal mass 10/12/2021   Performance anxiety 08/17/2021   Glaucoma 08/14/2021   Insomnia 08/14/2021   Hyperlipoproteinemia 08/14/2021   BPH (benign prostatic hyperplasia) 08/14/2021   Erectile dysfunction 08/14/2021   Trigger finger, acquired 07/11/2013   S/P right THA, AA 10/25/2012   BACK PAIN, LUMBAR 01/22/2010   NONSPEC REACT TUBERCULIN SKN TEST W/O ACTV TB 06/13/2009   HEPATITIS C 02/12/2007   ALLERGIC RHINITIS 02/12/2007   History of colonic polyps 02/12/2007    ONSET DATE: 10/08/2023 (referral)   REFERRING DIAG: G82.52 (ICD-10-CM) - Quadriplegia, C1-C4 incomplete S12.300A (ICD-10-CM) - Unspecified displaced fracture of fourth cervical vertebra, initial  encounter for closed fracture S14.129A (ICD-10-CM) - Central cord syndrome at unspecified level of cervical spinal cord, initial encounter  THERAPY DIAG:  Muscle weakness (generalized)  Other abnormalities of gait and mobility  Rationale for Evaluation and Treatment: Rehabilitation  SUBJECTIVE:                                                                                                                                                                                             SUBJECTIVE STATEMENT: Pt presents without compression stockings on, no cervical collar. Reports having a very bad pain weekend, Nathanel put him on chair rest. States he is bleeding from his urethra and having intense bladder spasms that will knock me down Does feel better w/reduced movement but is worried about regressing in PT. BP more hypotensive today. Took pain meds prior to session.    Pt accompanied by: Self   PERTINENT HISTORY: HTN, HLD, BPH, Glaucoma, Hx of hep C s/p treatment  PAIN:  Are you having pain? Yes: NPRS scale: 5/10  Pain location: Bilateral shoulder girdles L > R, urethra  Pain description: Achy  Aggravating factors: Moving arm Relieving factors: Pain meds  PRECAUTIONS: Cervical, Fall, and Other: No lifting over 5#, foley catheter  RED FLAGS: None   WEIGHT BEARING RESTRICTIONS: No  FALLS: Has patient fallen in last 6 months? No  LIVING ENVIRONMENT: Lives with: lives with their spouse Lives in: House/apartment Stairs: Yes: Internal: 16 steps; on right going up, on left going up, and can reach both and External: 3 + 1 steps; on right going up, on left going up, and can reach both Has following equipment at home: Vannie - 4 wheeled, Shower bench, and bed side commode  PLOF: Independent  PATIENT GOALS: To be able to hike in the [place] in August and in [place] in 07/2024. I would like to be  able to play the cello again and walk my dog along the trails in about a month    OBJECTIVE:  Note: Objective measures were completed at Evaluation unless otherwise noted.  DIAGNOSTIC FINDINGS: MRI of C-spine from 09/08/2023  IMPRESSION: 1. Signal abnormality within the ventral spinal cord at the C3-C4 level likely reflecting cord contusion. 2. Edema at site of a known acute fracture within the C4 spinous process. 3. Ligamentum flavum irregularity at C4-C5 consistent with ligamentum flavum injury, and possible ligamentum flavum disruption. 4. Edema signal within the interspinous spaces at C3-C4, C4-C5, C5-C6, C6-C7 and C7-T1 consistent with interspinous ligament injury. 5. Thinned appearance of the anterior longitudinal ligament at the C3 level suspicious for anterior longitudinal ligament injury (without complete ligamentous disruption). 6. Prevertebral edema/hematoma spanning the C2-C6 levels. 7. Cervical spondylosis as outlined within the body of the report. Moderate spinal canal stenosis at C3-C4 and C4-C5. Multilevel foraminal stenosis, greatest bilaterally at C3-C4 (moderate/severe) and on the left at C4-C5 (moderate). 8. Vertebral body and left-sided facet ankylosis at C2-C3.  COGNITION: Overall cognitive status: Within functional limits for tasks assessed   SENSATION: Stocking numbness in BLEs   POSTURE: rounded shoulders and pt wearing cervical collar  LOWER EXTREMITY ROM:     Active  Right Eval Left Eval  Hip flexion    Hip extension    Hip abduction    Hip adduction    Hip internal rotation    Hip external rotation    Knee flexion    Knee extension    Ankle dorsiflexion    Ankle plantarflexion    Ankle inversion    Ankle eversion     (Blank rows = not tested)  LOWER EXTREMITY MMT:  Tested in seated position   MMT Right Eval Left Eval  Hip flexion 4- 4-  Hip extension    Hip abduction 5 5  Hip adduction 5 5  Hip internal rotation    Hip external rotation    Knee flexion 5 5  Knee extension 5 5  Ankle dorsiflexion 5 5   Ankle plantarflexion    Ankle inversion    Ankle eversion    (Blank rows = not tested)  BED MOBILITY:  Pt reports he needs more support to get out of his bed at home due to having to get out on the L side (was getting out on the R side in the hospital) and has difficulty propping up on L shoulder due to pain  TRANSFERS: Assistive device utilized: Environmental consultant - 4 wheeled  Sit to stand: SBA Stand to sit: SBA Chair to chair: SBA Noted decreased anterior weight shift initially w/minor lateral shift to L side. Poor eccentric control w/fatigue   STAIRS: Level of Assistance: SBA Stair Negotiation Technique: Alternating Pattern  with Bilateral Rails Number of Stairs: 4  Height of Stairs: 6  Comments: Decreased eccentric control on RLE   GAIT: Gait pattern: step through pattern, lateral hip instability, trunk flexed, and wide BOS Distance walked: Various clinic distances  Assistive device utilized: Environmental consultant - 4 wheeled and None Level of assistance: Modified independence and SBA Comments: Pt is mod I w/rollator and SBA w/no AD due to anterior instability and decreased visual scanning ability   VITALS:  There were no vitals filed for this visit.      CERVICAL ROM: tested in seated position, pt w/p! In all directions   Active ROM A/ROM (deg) 12/02/23 A/ROM (deg)  12/28/23  Flexion 24 30  Extension 15 30  Right  lateral flexion 13 28  Left lateral flexion 8 19  Right rotation 11 23  Left rotation 21 30   (Blank rows = not tested)                                                                                                TREATMENT :   Arrived no charge  Pt wearing shorts today and noted bloody, dark urine. Pt reports urologist is aware of this and he has messaged his urologist to request earlier appointment.  Encouraged pt to reduce his mobility until he sees urologist as he is having severe pain, not sleeping and does not look well. Pt upset over this, stating he does not want to  regress in therapy. Informed pt that resting is just as important for progress and doing too much is just as detrimental to progress. Pt verbalized understanding.  Added PT session for next week and encouraged pt to perform gentle exercises at home (shoulder exercises in seated) and to avoid excessive walking, monster walks or twisting.    PATIENT EDUCATION: Education details: See above  Person educated: Patient Education method: Medical illustrator Education comprehension: verbalized understanding and needs further education  HOME EXERCISE PROGRAM: From [employer]; [id number]  Desensitization Techniques: -Light touch/pressure:  using fingertip pressure to slowly move up small segments of the affected body part until outside the area of pain and then back to where you started -Deep pressure:  using fingertips to squeeze in small segments starting outside the affected area, crossing over the affected area, and then to the other side of the affected area -Tapping:  fingertips tap with firm pressure over the affected area, can move around the affected area as well -Brushing/scratching:  use fingertips to brush/scratch over and around the affected area -Textures:  use soft and rough textured items like cotton balls vs wash cloths to brush or rub with light into firm pressure over and around the affected area  Repeat these 3-4x per day.   Verbally added 11/15/23: Supine serratus punch 3 x 10 reps Supine L biceps and ant delt stretch off EOB 3 x 30 sec Access Code: [email or web address] URL: [email or web address] Date: 11/22/2023 Prepared by: Marlon Kelin Nixon  Exercises - Seated Hamstring Stretch  - 1 x daily - 7 x weekly - 1-2 reps - 60-90 seconds hold - Soleus Stretch on Wall  - 1 x daily - 7 x weekly - 60-90 seconds hold - Staggered Stance Squat  - 1 x daily - 7 x weekly - 3 sets - 10 reps - Supine Cervical Retraction with Towel  - 1 x daily - 7 x weekly - 3 sets - 10 reps -  Supine Cervical Circles on Pillow  - 1 x daily - 7 x weekly - 3 sets - 10 reps  Verbally added on 12/09/23:  - Fwd/retro/lat monster walks w/green resistance band  - Prone or modified plantigrade cervical extension w/2-3s hold   Verbally added on 12/13/23: - mod plantigrade bird dogs  -serratus wall pushes   GOALS: Goals reviewed with patient? Yes  SHORT TERM GOALS: Target date: 11/09/2023  Pt will be independent with initial HEP for improved strength, balance, transfers and gait. Baseline: Goal status: MET  2.  MCTSIB to be assessed and STG/LTG updated ;  hold condition 4 on mCTSIB with minimal to no postural sway for improved balance. (SD) Baseline: 30 sec hold with moderate postural sway, 30 sec with mild postural sway (5/5) Goal status: MET  3.  to be assessed and LTG updated  Baseline: 1282.10' Goal status: Goal met 10-18-23  4.  Modified FGA to be assessed and LTG updated  Baseline: 22/30 Goal status: MET  LONG TERM GOALS: Target date: 11/23/2023   Patient will score >/=25/30 (accounting for not being able to complete head turns) on FGA to demonstrate improved walking balance Baseline: 22/30 Goal status: IN PROGRESS  2.  goal - increase distance to >1400' for increased endurance/gait efficiency. (SD) Baseline:  Goal status: DC  3.  MCTSIB goal -  hold condition 4 on mCTSIB with minimal to no postural sway for improved balance. (SD) Baseline:  Goal status: MET  4.  Pt will return to walking dog on trail w/SBA and no instability for return to PLOF and improved independence  Baseline:  Goal status: MET  NEW SHORT TERM GOALS:   Target date: 12/27/2023  Patient will score >/=25/30 (accounting for not being able to complete head turns) on FGA to demonstrate improved walking balance Baseline: 22/30; 27/30  Goal status: MET  2.  Pt will improve cervical A/ROM by 10 degrees in all planes for improved functional use of cervical spine and return to driving   Baseline: Assessed on 5/22; see chart above  Goal status: PARTIALLY MET  3.  MiniBest to be assessed and LTG updated  Baseline:  Goal status: MET   NEW LONG TERM GOALS:  Target date: 01/10/2024  Pt will improve both left and right cervical rotation to > 40 degrees for improved functional use of cervical spine and return to driving  Baseline: 11 degrees R, 21 degrees L Goal status: REVISED  2.  Pt will improve MiniBest to 24/28 for decreased fall risk and improvement with compensatory stepping strategies.   Baseline: 22/28 Goal status: REVISED   ASSESSMENT:  CLINICAL IMPRESSION: Arrived no charge due to high pain levels, bloody urine and pt inability to tolerate PT due to bladder spasms.     OBJECTIVE IMPAIRMENTS: Abnormal gait, decreased activity tolerance, decreased balance, decreased coordination, decreased endurance, decreased mobility, decreased strength, dizziness, impaired sensation, impaired UE functional use, improper body mechanics, and pain  ACTIVITY LIMITATIONS: carrying, lifting, bending, squatting, transfers, bed mobility, toileting, dressing, reach over head, hygiene/grooming, locomotion level, and caring for others  PARTICIPATION LIMITATIONS: meal prep, cleaning, laundry, medication management, driving, shopping, community activity, occupation, and yard work  PERSONAL FACTORS: Fitness, Past/current experiences, and 1 comorbidity: quadriplegia are also affecting patient's functional outcome.   REHAB POTENTIAL: Excellent  CLINICAL DECISION MAKING: Evolving/moderate complexity  EVALUATION COMPLEXITY: Moderate  PLAN:  PT FREQUENCY: 2x/week  PT DURATION: 6 weeks + 6 weeks (recert)  PLANNED INTERVENTIONS: 02835- PT Re-evaluation, 97110-Therapeutic exercises, 97530- Therapeutic activity, 97112- Neuromuscular re-education, 97535- Self Care, 02859- Manual therapy, [phone]- Gait training, [phone]- Orthotic Fit/training, [phone]- Aquatic Therapy, [phone]- Electrical  stimulation (manual), Patient/Family education, Balance training, Stair training, Dry Needling, Joint mobilization, Spinal mobilization, Vestibular training, and DME instructions  PLAN FOR NEXT SESSION: Monitor vitals- pt very hypotensive. Work on return to hiking; NBOS, eyes closed, eccentric control, endurance, response to DN? L shoulder periscapular strength, does he want to  schedule again with TT for DN of his L biceps and ant delt? Posterior stability (pt loses balance w/toe raises), cervical ROM.    Abbie Berling E Yarel Rushlow, PT, DPT 01/03/2024, 10:43 AM

## 2024-01-06 ENCOUNTER — Ambulatory Visit: Admitting: Physical Therapy

## 2024-01-07 ENCOUNTER — Telehealth: Payer: Self-pay

## 2024-01-07 ENCOUNTER — Telehealth: Payer: Self-pay | Admitting: Physical Medicine and Rehabilitation

## 2024-01-07 NOTE — Telephone Encounter (Signed)
 Pt requested a refill on tramadol  and all his remaining medications. Pt stated that he can come in due to his spinal cord injury.

## 2024-01-07 NOTE — Telephone Encounter (Signed)
 Patient called in requesting medication refill on traMADol  (ULTRAM ) 50 MG tablet - patient states he has had increase pain since 6/14 and has started taking medication again and only has 2 days left of medication , he would like it sent to [facility] and would like a call back if medication can not be refilled today so he can contact his PCP . Patient is aware Dr Cornelio is out of office

## 2024-01-10 ENCOUNTER — Encounter: Payer: Self-pay | Admitting: Occupational Therapy

## 2024-01-10 ENCOUNTER — Ambulatory Visit: Admitting: Physical Therapy

## 2024-01-10 ENCOUNTER — Ambulatory Visit: Admitting: Occupational Therapy

## 2024-01-10 DIAGNOSIS — Z86711 Personal history of pulmonary embolism: Secondary | ICD-10-CM | POA: Diagnosis not present

## 2024-01-10 DIAGNOSIS — N319 Neuromuscular dysfunction of bladder, unspecified: Secondary | ICD-10-CM | POA: Diagnosis not present

## 2024-01-10 NOTE — Telephone Encounter (Signed)
 Dr. Harlow has been informed that Dr. Cornelio is out of the office. She will be informed of his request tomorrow.

## 2024-01-11 DIAGNOSIS — H409 Unspecified glaucoma: Secondary | ICD-10-CM | POA: Diagnosis not present

## 2024-01-11 DIAGNOSIS — M21371 Foot drop, right foot: Secondary | ICD-10-CM | POA: Diagnosis not present

## 2024-01-11 DIAGNOSIS — E785 Hyperlipidemia, unspecified: Secondary | ICD-10-CM | POA: Diagnosis not present

## 2024-01-11 DIAGNOSIS — Z87891 Personal history of nicotine dependence: Secondary | ICD-10-CM | POA: Diagnosis not present

## 2024-01-11 DIAGNOSIS — N3289 Other specified disorders of bladder: Secondary | ICD-10-CM | POA: Diagnosis not present

## 2024-01-11 DIAGNOSIS — Z86711 Personal history of pulmonary embolism: Secondary | ICD-10-CM | POA: Diagnosis not present

## 2024-01-11 DIAGNOSIS — I10 Essential (primary) hypertension: Secondary | ICD-10-CM | POA: Diagnosis not present

## 2024-01-11 DIAGNOSIS — N319 Neuromuscular dysfunction of bladder, unspecified: Secondary | ICD-10-CM | POA: Diagnosis not present

## 2024-01-11 DIAGNOSIS — S14129S Central cord syndrome at unspecified level of cervical spinal cord, sequela: Secondary | ICD-10-CM | POA: Diagnosis not present

## 2024-01-11 DIAGNOSIS — G8252 Quadriplegia, C1-C4 incomplete: Secondary | ICD-10-CM | POA: Diagnosis not present

## 2024-01-11 DIAGNOSIS — Z79899 Other long term (current) drug therapy: Secondary | ICD-10-CM | POA: Diagnosis not present

## 2024-01-11 MED ORDER — TRAMADOL HCL 50 MG PO TABS: 50.0000 mg | ORAL_TABLET | Freq: Four times a day (QID) | ORAL | 3 refills | Status: DC | PRN

## 2024-01-11 NOTE — Telephone Encounter (Signed)
 Dr. Cornelio sent the Rx to [facility] today. Patient has been informed.

## 2024-01-12 ENCOUNTER — Ambulatory Visit: Payer: Self-pay | Admitting: Physical Therapy

## 2024-01-12 DIAGNOSIS — E785 Hyperlipidemia, unspecified: Secondary | ICD-10-CM | POA: Diagnosis not present

## 2024-01-12 DIAGNOSIS — G8252 Quadriplegia, C1-C4 incomplete: Secondary | ICD-10-CM | POA: Diagnosis not present

## 2024-01-12 DIAGNOSIS — Z87891 Personal history of nicotine dependence: Secondary | ICD-10-CM | POA: Diagnosis not present

## 2024-01-12 DIAGNOSIS — Z79899 Other long term (current) drug therapy: Secondary | ICD-10-CM | POA: Diagnosis not present

## 2024-01-12 DIAGNOSIS — Z86711 Personal history of pulmonary embolism: Secondary | ICD-10-CM | POA: Diagnosis not present

## 2024-01-12 DIAGNOSIS — I10 Essential (primary) hypertension: Secondary | ICD-10-CM | POA: Diagnosis not present

## 2024-01-12 DIAGNOSIS — N3289 Other specified disorders of bladder: Secondary | ICD-10-CM | POA: Diagnosis not present

## 2024-01-12 DIAGNOSIS — S14129S Central cord syndrome at unspecified level of cervical spinal cord, sequela: Secondary | ICD-10-CM | POA: Diagnosis not present

## 2024-01-12 DIAGNOSIS — N319 Neuromuscular dysfunction of bladder, unspecified: Secondary | ICD-10-CM | POA: Diagnosis not present

## 2024-01-12 DIAGNOSIS — M21371 Foot drop, right foot: Secondary | ICD-10-CM | POA: Diagnosis not present

## 2024-01-12 DIAGNOSIS — H409 Unspecified glaucoma: Secondary | ICD-10-CM | POA: Diagnosis not present

## 2024-01-13 ENCOUNTER — Ambulatory Visit: Admitting: Physical Therapy

## 2024-01-13 ENCOUNTER — Encounter: Payer: Self-pay | Admitting: Internal Medicine

## 2024-01-16 ENCOUNTER — Encounter: Payer: Self-pay | Admitting: Physical Medicine and Rehabilitation

## 2024-01-17 ENCOUNTER — Other Ambulatory Visit: Payer: Self-pay | Admitting: Internal Medicine

## 2024-01-17 ENCOUNTER — Ambulatory Visit: Attending: Physician Assistant | Admitting: Physical Therapy

## 2024-01-17 ENCOUNTER — Telehealth: Payer: Self-pay

## 2024-01-17 ENCOUNTER — Ambulatory Visit: Admitting: Occupational Therapy

## 2024-01-17 VITALS — BP 87/58 | HR 85

## 2024-01-17 DIAGNOSIS — R293 Abnormal posture: Secondary | ICD-10-CM | POA: Diagnosis not present

## 2024-01-17 DIAGNOSIS — R2681 Unsteadiness on feet: Secondary | ICD-10-CM | POA: Diagnosis not present

## 2024-01-17 DIAGNOSIS — R29818 Other symptoms and signs involving the nervous system: Secondary | ICD-10-CM | POA: Diagnosis not present

## 2024-01-17 DIAGNOSIS — M6281 Muscle weakness (generalized): Secondary | ICD-10-CM

## 2024-01-17 DIAGNOSIS — M542 Cervicalgia: Secondary | ICD-10-CM | POA: Diagnosis not present

## 2024-01-17 DIAGNOSIS — M25512 Pain in left shoulder: Secondary | ICD-10-CM | POA: Insufficient documentation

## 2024-01-17 DIAGNOSIS — R2689 Other abnormalities of gait and mobility: Secondary | ICD-10-CM | POA: Insufficient documentation

## 2024-01-17 DIAGNOSIS — G8929 Other chronic pain: Secondary | ICD-10-CM | POA: Diagnosis not present

## 2024-01-17 DIAGNOSIS — R278 Other lack of coordination: Secondary | ICD-10-CM | POA: Insufficient documentation

## 2024-01-17 DIAGNOSIS — R208 Other disturbances of skin sensation: Secondary | ICD-10-CM | POA: Insufficient documentation

## 2024-01-17 DIAGNOSIS — S12301D Unspecified nondisplaced fracture of fourth cervical vertebra, subsequent encounter for fracture with routine healing: Secondary | ICD-10-CM

## 2024-01-17 MED ORDER — MORPHINE SULFATE ER 15 MG PO TBCR: 15.0000 mg | EXTENDED_RELEASE_TABLET | Freq: Two times a day (BID) | ORAL | 0 refills | Status: DC

## 2024-01-17 NOTE — Therapy (Signed)
 OUTPATIENT PHYSICAL THERAPY NEURO TREATMENT - RECERTIFICATION  Patient Name: Brandon Watts MRN: [id number] DOB:14-Feb-1946, 78 y.o., male Today's Date: 01/17/2024   PCP: Geofm Glade PARAS, MD REFERRING PROVIDER: Pegge Toribio PARAS, PA-C   END OF SESSION:  PT End of Session - 01/17/24 1022     Visit Number 19    Number of Visits 27   Recert   Date for PT Re-Evaluation 03/13/24   recert   Authorization Type [employer]    Authorization Time Period 4 approved visits (PT) 6/17-7/15/25    PT Start Time 1020    PT Stop Time 1104    PT Time Calculation (min) 44 min    Equipment Utilized During Treatment --    Activity Tolerance Patient tolerated treatment well;Treatment limited secondary to medical complications (Comment)   Orthostatic hypotension   Behavior During Therapy WFL for tasks assessed/performed              Past Medical History:  Diagnosis Date   Allergic rhinitis    Arthritis    PAIN AND OA RIGHT HIP   Arthritis    BPH (benign prostatic hyperplasia)    DDD (degenerative disc disease), lumbar    MILD-FOUND ON XRAY   Glaucoma    normotensive type with early changes   Glaucoma    Hepatitis    HEPATITIS C -CLEARED   Hepatitis    Hep C, treated and cleared   HLD (hyperlipidemia)    taking statin as preventative   Hypertension    Insomnia    CHRONIC   Insomnia    Positive PPD, treated    Pulmonary nodules    FOLLOWED BY DR. LAMAR BROCKS GATES WITH CT EXAMS-MOST RECENT 01/22/12   Status post trigger finger release    right   Past Surgical History:  Procedure Laterality Date   ANTERIOR CERVICAL DECOMP/DISCECTOMY FUSION N/A 09/09/2023   Procedure: ANTERIOR CERVICAL DECOMPRESSION/DISCECTOMY FUSION 2 LEVEL/HARDWARE REMOVAL - C 3-4 , C4-5;  Surgeon: Debby Dorn MATSU, MD;  Location: [facility];  Service: Neurosurgery;  Laterality: N/A;   EYE SURGERY     LASIK EYE SURGERY   LITHOTRIPSY     x 3   LITHOTRIPSY     x2   TONSILLECTOMY     AS A CHILD   TONSILLECTOMY      TOTAL HIP ARTHROPLASTY Right 10/25/2012   Procedure: RIGHT TOTAL HIP ARTHROPLASTY ANTERIOR APPROACH;  Surgeon: Donnice JONETTA Car, MD;  Location: [facility];  Service: Orthopedics;  Laterality: Right;   TOTAL HIP ARTHROPLASTY Right    TRIGGER FINGER RELEASE Right 2016   third digit right hand. Dr.Supple   URETERAL STONE EXTRACTION S X 2     VASCECTOMY     Patient Active Problem List   Diagnosis Date Noted   Hypotension 10/22/2023   Gross hematuria 10/22/2023   Constipation 10/22/2023   Quadriplegia, C1-C4, incomplete (HCC) 09/18/2023   C4 cervical fracture (HCC) 09/17/2023   Central cord syndrome (HCC) 09/17/2023   Dysphagia 09/17/2023   Respiratory failure with hypoxia (HCC) 09/17/2023   SDH (subdural hematoma) (HCC) 09/17/2023   Malnutrition of moderate degree 09/15/2023   Acute pulmonary embolism without acute cor pulmonale (HCC) 09/15/2023   Post-nasal drainage 09/15/2023   Pneumonia of left lower lobe due to infectious organism 09/15/2023   Hyperlipidemia 09/08/2023   Diastolic dysfunction without heart failure 09/17/2022   Episodic lightheadedness 08/26/2022   Vasovagal near syncope 08/26/2022   Hypercalcemia 08/25/2022   Hyperglycemia 08/25/2022   Acute left-sided  low back pain with left-sided sciatica 10/13/2021   Left renal mass 10/12/2021   Performance anxiety 08/17/2021   Glaucoma 08/14/2021   Insomnia 08/14/2021   Hyperlipoproteinemia 08/14/2021   BPH (benign prostatic hyperplasia) 08/14/2021   Erectile dysfunction 08/14/2021   Trigger finger, acquired 07/11/2013   S/P right THA, AA 10/25/2012   BACK PAIN, LUMBAR 01/22/2010   NONSPEC REACT TUBERCULIN SKN TEST W/O ACTV TB 06/13/2009   HEPATITIS C 02/12/2007   ALLERGIC RHINITIS 02/12/2007   History of colonic polyps 02/12/2007    ONSET DATE: 10/08/2023 (referral)   REFERRING DIAG: G82.52 (ICD-10-CM) - Quadriplegia, C1-C4 incomplete S12.300A (ICD-10-CM) - Unspecified displaced fracture of fourth cervical vertebra,  initial encounter for closed fracture S14.129A (ICD-10-CM) - Central cord syndrome at unspecified level of cervical spinal cord, initial encounter  THERAPY DIAG:  Muscle weakness (generalized)  Other abnormalities of gait and mobility  Other lack of coordination  Chronic left shoulder pain  Rationale for Evaluation and Treatment: Rehabilitation  SUBJECTIVE:                                                                                                                                                                                             SUBJECTIVE STATEMENT: Pt presents following suprapubic catheter insertion last week. Reports his bladder spasms are much better, having some prostate pain. States his aspiration has worsened over the past 10 days, but has a history of aspiration prior to his accident and has tips from SLP to work on this. A week of bed rest really helped his shoulder and he is allowing himself to nap as much as he needs to help his body recover. No falls.    Pt accompanied by: Self   PERTINENT HISTORY: HTN, HLD, BPH, Glaucoma, Hx of hep C s/p treatment  PAIN:  Are you having pain? Yes: NPRS scale: 2/10  Pain location: Bilateral shoulder girdles L > R Pain description: Achy  Aggravating factors: Moving arm Relieving factors: Pain meds  PRECAUTIONS: Cervical, Fall, and Other: No lifting over 5#, foley catheter  RED FLAGS: None   WEIGHT BEARING RESTRICTIONS: No  FALLS: Has patient fallen in last 6 months? No  LIVING ENVIRONMENT: Lives with: lives with their spouse Lives in: House/apartment Stairs: Yes: Internal: 16 steps; on right going up, on left going up, and can reach both and External: 3 + 1 steps; on right going up, on left going up, and can reach both Has following equipment at home: Walker - 4 wheeled, Shower bench, and bed side commode  PLOF: Independent  PATIENT GOALS: To be able to hike in the [place] in August and  in [place] in  07/2024. I would like to be able to play the cello again and walk my dog along the trails in about a month   OBJECTIVE:  Note: Objective measures were completed at Evaluation unless otherwise noted.  DIAGNOSTIC FINDINGS: MRI of C-spine from 09/08/2023  IMPRESSION: 1. Signal abnormality within the ventral spinal cord at the C3-C4 level likely reflecting cord contusion. 2. Edema at site of a known acute fracture within the C4 spinous process. 3. Ligamentum flavum irregularity at C4-C5 consistent with ligamentum flavum injury, and possible ligamentum flavum disruption. 4. Edema signal within the interspinous spaces at C3-C4, C4-C5, C5-C6, C6-C7 and C7-T1 consistent with interspinous ligament injury. 5. Thinned appearance of the anterior longitudinal ligament at the C3 level suspicious for anterior longitudinal ligament injury (without complete ligamentous disruption). 6. Prevertebral edema/hematoma spanning the C2-C6 levels. 7. Cervical spondylosis as outlined within the body of the report. Moderate spinal canal stenosis at C3-C4 and C4-C5. Multilevel foraminal stenosis, greatest bilaterally at C3-C4 (moderate/severe) and on the left at C4-C5 (moderate). 8. Vertebral body and left-sided facet ankylosis at C2-C3.  COGNITION: Overall cognitive status: Within functional limits for tasks assessed   SENSATION: Stocking numbness in BLEs   POSTURE: rounded shoulders and pt wearing cervical collar  LOWER EXTREMITY ROM:     Active  Right Eval Left Eval  Hip flexion    Hip extension    Hip abduction    Hip adduction    Hip internal rotation    Hip external rotation    Knee flexion    Knee extension    Ankle dorsiflexion    Ankle plantarflexion    Ankle inversion    Ankle eversion     (Blank rows = not tested)  LOWER EXTREMITY MMT:  Tested in seated position   MMT Right Eval Left Eval  Hip flexion 4- 4-  Hip extension    Hip abduction 5 5  Hip adduction 5 5  Hip  internal rotation    Hip external rotation    Knee flexion 5 5  Knee extension 5 5  Ankle dorsiflexion 5 5  Ankle plantarflexion    Ankle inversion    Ankle eversion    (Blank rows = not tested)  BED MOBILITY:  Pt reports he needs more support to get out of his bed at home due to having to get out on the L side (was getting out on the R side in the hospital) and has difficulty propping up on L shoulder due to pain  TRANSFERS: Assistive device utilized: Environmental consultant - 4 wheeled  Sit to stand: SBA Stand to sit: SBA Chair to chair: SBA Noted decreased anterior weight shift initially w/minor lateral shift to L side. Poor eccentric control w/fatigue   STAIRS: Level of Assistance: SBA Stair Negotiation Technique: Alternating Pattern  with Bilateral Rails Number of Stairs: 4  Height of Stairs: 6  Comments: Decreased eccentric control on RLE   GAIT: Gait pattern: step through pattern, lateral hip instability, trunk flexed, and wide BOS Distance walked: Various clinic distances  Assistive device utilized: Environmental consultant - 4 wheeled and None Level of assistance: Modified independence and SBA Comments: Pt is mod I w/rollator and SBA w/no AD due to anterior instability and decreased visual scanning ability   VITALS:  Vitals:   01/17/24 1036 01/17/24 1037  BP: 133/80 (!) 87/58  Pulse: 75 85      CERVICAL ROM: tested in seated position, pt w/p! In all directions   Active ROM  A/ROM (deg) 12/02/23 A/ROM (deg)  12/28/23 A/ROM (deg) 01/17/24  Flexion 24 30 41  Extension 15 30 29   Right lateral flexion 13 28 10   Left lateral flexion 8 19 10   Right rotation 11 23 21   Left rotation 21 30 27    (Blank rows = not tested)                                                                                                TREATMENT :  Self-care/home management  Discussed recent medical updates and potential to start pelvic health PT or talk to Dr. Cornelio about bowel management.  Discussed POC moving  forward and pt would like to continue w/PT, so will send recert next visit.  Assessed vitals (see above) and pt orthostatic and symptomatic today. Pt surprised by this as he states his systolic BP dropped at home this morning but his diastolic stayed stable. Encouraged pt to bring his BP cuff to clinic to compare to clinic cuff in future, pt verbalized understanding.   Ther Act  Assessed cervical ROM in seated position (see chart above). Pt most improved w/cervical flexion but did not improve in other planes. Pt reports he feels very stiff today as well.  Pt reports he has been working on scapular protraction and shoulder flexion, but not scapular retraction. Assessed L scapula and noted winging and weakness of surrounding musculature, so reminded pt of importance of working on scapular stability and retraction for proper scapulohumeral rhythm and reduced impingement of LLE. The following were performed to assist with this:  Seated scapular retraction w/tactile cues applied to rhomboids and trapezius, x10 reps  Seated single arm rows w/red resistance band, x10 reps. Min cues to reduce shoulder shrug w/activity  Bent over rows w/15# surge, x10 reps. Pt denied pain w/movement.  Single arm DB rows w/5# DB, x8 reps per side. Much more challenging for pt, so added to HEP (see bolded below)  Standing shrugs w/LLE using 5# DB, x8 reps. Pt initially reporting significant pain w/movement and noted pt moving very quickly. Cued pt to slow down and pain did dissipate.  Upon reviewing session w/pt, pt unable to remember scapular retraction vs protraction and demonstrated incorrect exercises that therapist has not had pt perform. Provided pt w/printout of single arm DB rows to refresh his memory, as pt has declined printouts in past stating he can remember exercises. Will plan on revising HEP to ensure pt is working on scapular retraction and has printed copy of movements.     PATIENT EDUCATION: Education details:  Additions to HEP, importance of working on scapular retraction  Person educated: Patient Education method: Explanation, Demonstration, Tactile cues, Verbal cues, and Handouts Education comprehension: verbalized understanding, returned demonstration, verbal cues required, tactile cues required, and needs further education  HOME EXERCISE PROGRAM: From [employer]; [id number]  Desensitization Techniques: -Light touch/pressure:  using fingertip pressure to slowly move up small segments of the affected body part until outside the area of pain and then back to where you started -Deep pressure:  using fingertips to squeeze in small segments starting outside the affected area,  crossing over the affected area, and then to the other side of the affected area -Tapping:  fingertips tap with firm pressure over the affected area, can move around the affected area as well -Brushing/scratching:  use fingertips to brush/scratch over and around the affected area -Textures:  use soft and rough textured items like cotton balls vs wash cloths to brush or rub with light into firm pressure over and around the affected area  Repeat these 3-4x per day.   Verbally added 11/15/23: Supine serratus punch 3 x 10 reps Supine L biceps and ant delt stretch off EOB 3 x 30 sec Access Code: [email or web address] URL: [email or web address] Date: 11/22/2023 Prepared by: Marlon Kathelene Rumberger  Exercises - Seated Hamstring Stretch  - 1 x daily - 7 x weekly - 1-2 reps - 60-90 seconds hold - Soleus Stretch on Wall  - 1 x daily - 7 x weekly - 60-90 seconds hold - Staggered Stance Squat  - 1 x daily - 7 x weekly - 3 sets - 10 reps - Supine Cervical Retraction with Towel  - 1 x daily - 7 x weekly - 3 sets - 10 reps - Supine Cervical Circles on Pillow  - 1 x daily - 7 x weekly - 3 sets - 10 reps - Bent Over Single Arm Shoulder Row with Dumbbell  - 1 x daily - 7 x weekly - 3 sets - 10 reps  Verbally added on 12/09/23:  - Fwd/retro/lat  monster walks w/green resistance band  - Prone or modified plantigrade cervical extension w/2-3s hold   Verbally added on 12/13/23: - mod plantigrade bird dogs  -serratus wall pushes   GOALS: Goals reviewed with patient? Yes  SHORT TERM GOALS:   Target date: 12/27/2023  Patient will score >/=25/30 (accounting for not being able to complete head turns) on FGA to demonstrate improved walking balance Baseline: 22/30; 27/30  Goal status: MET  2.  Pt will improve cervical A/ROM by 10 degrees in all planes for improved functional use of cervical spine and return to driving  Baseline: Assessed on 5/22; see chart above  Goal status: PARTIALLY MET  3.  MiniBest to be assessed and LTG updated  Baseline:  Goal status: MET   LONG TERM GOALS:  Target date: 01/10/2024  Pt will improve both left and right cervical rotation to > 40 degrees for improved functional use of cervical spine and return to driving  Baseline: 11 degrees R, 21 degrees L Goal status: NOT MET  2.  Pt will improve MiniBest to 24/28 for decreased fall risk and improvement with compensatory stepping strategies.   Baseline: 22/28 Goal status: IN PROGRESS  NEW SHORT TERM GOALS:   Target date: 02/14/2024  Pt will improve both left and right cervical rotation to > 40 degrees for improved functional use of cervical spine and return to driving  Baseline:  Active ROM A/ROM (deg) 12/02/23 A/ROM (deg)  12/28/23 A/ROM (deg) 01/17/24  Flexion 24 30 41  Extension 15 30 29   Right lateral flexion 13 28 10   Left lateral flexion 8 19 10   Right rotation 11 23 21   Left rotation 21 30 27    (Blank rows = not tested)  Goal status: INITIAL  2.  Pt will improve MiniBest to 24/28 for decreased fall risk and improvement with compensatory stepping strategies.  Baseline: 22/28 Goal status: IN PROGRESS   NEW LONG TERM GOALS:  Target date: 03/13/2024  Pt will improve left and right cervical lateral flexion to >/= 25 degrees  for improved  functional use of cervical spine and relaxation of upper traps  Baseline:  Active ROM A/ROM (deg) 12/02/23 A/ROM (deg)  12/28/23 A/ROM (deg) 01/17/24  Flexion 24 30 41  Extension 15 30 29   Right lateral flexion 13 28 10   Left lateral flexion 8 19 10   Right rotation 11 23 21   Left rotation 21 30 27    (Blank rows = not tested)  Goal status: INITIAL  2.  Pt will hold single leg stance on BLEs without shoes on for >/= 10s for improved ankle stability and return to hiking.  Baseline:  Goal status: INITIAL     ASSESSMENT:  CLINICAL IMPRESSION: Emphasis of skilled PT session on goal assessment, discussing POC moving forward and scapular retraction. Pt has not met any LTGs this date but is limited due to missing several sessions due to bladder spasms and having suprapubic catheter placed. Pt did improve his cervical flexion A/ROM, but minimally regressed on remainder of planes of motion this date. Pt reports his neck is still this AM and he has been on bedrest to recover from surgery, so this likely is a contributing factor. Pt has been working on scapular protraction at home but not retraction and demonstrates significant scapular winging and pain w/shoulder flexion/abduction due to impaired scapulohumeral rhythm. Added to HEP to work on scapular retraction, but pt would benefit from revision of HEP w/pictures (pt has previously declined these) as pt has impaired memory and cannot remember proper technique of some exercises. Pt will continue to benefit from skilled PT services to improve functional use of cervical spine and L hemibody strengthening. Continue POC.     OBJECTIVE IMPAIRMENTS: Abnormal gait, decreased activity tolerance, decreased balance, decreased coordination, decreased endurance, decreased mobility, decreased strength, dizziness, impaired sensation, impaired UE functional use, improper body mechanics, and pain  ACTIVITY LIMITATIONS: carrying, lifting, bending, squatting,  transfers, bed mobility, toileting, dressing, reach over head, hygiene/grooming, locomotion level, and caring for others  PARTICIPATION LIMITATIONS: meal prep, cleaning, laundry, medication management, driving, shopping, community activity, occupation, and yard work  PERSONAL FACTORS: Fitness, Past/current experiences, and 1 comorbidity: quadriplegia are also affecting patient's functional outcome.   REHAB POTENTIAL: Excellent  CLINICAL DECISION MAKING: Evolving/moderate complexity  EVALUATION COMPLEXITY: Moderate  PLAN:  PT FREQUENCY: 1-2x/week (recert)   PT DURATION: 6 weeks + 6 weeks + 8 weeks (recert)  PLANNED INTERVENTIONS: 02835- PT Re-evaluation, 97110-Therapeutic exercises, 97530- Therapeutic activity, 97112- Neuromuscular re-education, 97535- Self Care, 02859- Manual therapy, [phone]- Gait training, [phone]- Orthotic Fit/training, [phone]- Aquatic Therapy, [phone]- Electrical stimulation (manual), Patient/Family education, Balance training, Stair training, Dry Needling, Joint mobilization, Spinal mobilization, Vestibular training, and DME instructions  PLAN FOR NEXT SESSION: SEND AUTH. Monitor vitals- pt very hypotensive. Work on return to hiking; NBOS, eyes closed, eccentric control, endurance, response to DN? L shoulder periscapular strength, does he want to schedule again with TT for DN of his L biceps and ant delt? Posterior stability (pt loses balance w/toe raises), cervical ROM. Revise HEP.    Evolette Pendell E Fintan Grater, PT, DPT 01/17/2024, 1:01 PM
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
## 2024-01-20 ENCOUNTER — Ambulatory Visit: Admitting: Physical Therapy

## 2024-01-24 ENCOUNTER — Ambulatory Visit: Admitting: Physical Therapy

## 2024-01-24 VITALS — BP 97/66 | HR 76

## 2024-01-24 DIAGNOSIS — M25512 Pain in left shoulder: Secondary | ICD-10-CM | POA: Diagnosis not present

## 2024-01-24 DIAGNOSIS — R278 Other lack of coordination: Secondary | ICD-10-CM | POA: Diagnosis not present

## 2024-01-24 DIAGNOSIS — R208 Other disturbances of skin sensation: Secondary | ICD-10-CM | POA: Diagnosis not present

## 2024-01-24 DIAGNOSIS — R2681 Unsteadiness on feet: Secondary | ICD-10-CM | POA: Diagnosis not present

## 2024-01-24 DIAGNOSIS — M6281 Muscle weakness (generalized): Secondary | ICD-10-CM | POA: Diagnosis not present

## 2024-01-24 DIAGNOSIS — R293 Abnormal posture: Secondary | ICD-10-CM

## 2024-01-24 DIAGNOSIS — M542 Cervicalgia: Secondary | ICD-10-CM | POA: Diagnosis not present

## 2024-01-24 DIAGNOSIS — R29818 Other symptoms and signs involving the nervous system: Secondary | ICD-10-CM | POA: Diagnosis not present

## 2024-01-24 DIAGNOSIS — R2689 Other abnormalities of gait and mobility: Secondary | ICD-10-CM

## 2024-01-24 DIAGNOSIS — G8929 Other chronic pain: Secondary | ICD-10-CM | POA: Diagnosis not present
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
## 2024-02-01 ENCOUNTER — Ambulatory Visit: Payer: Self-pay | Admitting: Physical Therapy

## 2024-02-01 VITALS — BP 130/81 | HR 66

## 2024-02-01 DIAGNOSIS — R2689 Other abnormalities of gait and mobility: Secondary | ICD-10-CM

## 2024-02-01 DIAGNOSIS — R208 Other disturbances of skin sensation: Secondary | ICD-10-CM | POA: Diagnosis not present

## 2024-02-01 DIAGNOSIS — M25512 Pain in left shoulder: Secondary | ICD-10-CM | POA: Diagnosis not present

## 2024-02-01 DIAGNOSIS — M542 Cervicalgia: Secondary | ICD-10-CM | POA: Diagnosis not present

## 2024-02-01 DIAGNOSIS — M6281 Muscle weakness (generalized): Secondary | ICD-10-CM

## 2024-02-01 DIAGNOSIS — R29818 Other symptoms and signs involving the nervous system: Secondary | ICD-10-CM | POA: Diagnosis not present

## 2024-02-01 DIAGNOSIS — R293 Abnormal posture: Secondary | ICD-10-CM | POA: Diagnosis not present

## 2024-02-01 DIAGNOSIS — R2681 Unsteadiness on feet: Secondary | ICD-10-CM | POA: Diagnosis not present

## 2024-02-01 DIAGNOSIS — G8929 Other chronic pain: Secondary | ICD-10-CM | POA: Diagnosis not present

## 2024-02-01 DIAGNOSIS — R278 Other lack of coordination: Secondary | ICD-10-CM | POA: Diagnosis not present

## 2024-02-01 NOTE — Therapy (Signed)
 OUTPATIENT PHYSICAL THERAPY NEURO TREATMENT   Patient Name: Brandon Watts MRN: [id number] DOB:23-Sep-1945, 78 y.o., male Today's Date: 02/01/2024   PCP: Geofm Glade PARAS, MD REFERRING PROVIDER: Pegge Toribio PARAS, PA-C   END OF SESSION:  PT End of Session - 02/01/24 1232     Visit Number 21    Number of Visits 27   Recert   Date for PT Re-Evaluation 03/13/24   recert   Authorization Type [employer]    Authorization Time Period 4 approved visits (PT) 01/25/24 - 02/08/24    PT Start Time 1230    PT Stop Time 1319    PT Time Calculation (min) 49 min    Equipment Utilized During Treatment Gait belt    Activity Tolerance Patient tolerated treatment well    Behavior During Therapy WFL for tasks assessed/performed                Past Medical History:  Diagnosis Date   Allergic rhinitis    Arthritis    PAIN AND OA RIGHT HIP   Arthritis    BPH (benign prostatic hyperplasia)    DDD (degenerative disc disease), lumbar    MILD-FOUND ON XRAY   Glaucoma    normotensive type with early changes   Glaucoma    Hepatitis    HEPATITIS C -CLEARED   Hepatitis    Hep C, treated and cleared   HLD (hyperlipidemia)    taking statin as preventative   Hypertension    Insomnia    CHRONIC   Insomnia    Positive PPD, treated    Pulmonary nodules    FOLLOWED BY DR. LAMAR BROCKS GATES WITH CT EXAMS-MOST RECENT 01/22/12   Status post trigger finger release    right   Past Surgical History:  Procedure Laterality Date   ANTERIOR CERVICAL DECOMP/DISCECTOMY FUSION N/A 09/09/2023   Procedure: ANTERIOR CERVICAL DECOMPRESSION/DISCECTOMY FUSION 2 LEVEL/HARDWARE REMOVAL - C 3-4 , C4-5;  Surgeon: Debby Dorn MATSU, MD;  Location: [facility];  Service: Neurosurgery;  Laterality: N/A;   EYE SURGERY     LASIK EYE SURGERY   LITHOTRIPSY     x 3   LITHOTRIPSY     x2   TONSILLECTOMY     AS A CHILD   TONSILLECTOMY     TOTAL HIP ARTHROPLASTY Right 10/25/2012   Procedure: RIGHT TOTAL HIP ARTHROPLASTY  ANTERIOR APPROACH;  Surgeon: Donnice JONETTA Car, MD;  Location: [facility];  Service: Orthopedics;  Laterality: Right;   TOTAL HIP ARTHROPLASTY Right    TRIGGER FINGER RELEASE Right 2016   third digit right hand. Dr.Supple   URETERAL STONE EXTRACTION S X 2     VASCECTOMY     Patient Active Problem List   Diagnosis Date Noted   Hypotension 10/22/2023   Gross hematuria 10/22/2023   Constipation 10/22/2023   Quadriplegia, C1-C4, incomplete (HCC) 09/18/2023   C4 cervical fracture (HCC) 09/17/2023   Central cord syndrome (HCC) 09/17/2023   Dysphagia 09/17/2023   Respiratory failure with hypoxia (HCC) 09/17/2023   SDH (subdural hematoma) (HCC) 09/17/2023   Malnutrition of moderate degree 09/15/2023   Acute pulmonary embolism without acute cor pulmonale (HCC) 09/15/2023   Post-nasal drainage 09/15/2023   Pneumonia of left lower lobe due to infectious organism 09/15/2023   Hyperlipidemia 09/08/2023   Diastolic dysfunction without heart failure 09/17/2022   Episodic lightheadedness 08/26/2022   Vasovagal near syncope 08/26/2022   Hypercalcemia 08/25/2022   Hyperglycemia 08/25/2022   Acute left-sided low back pain with left-sided  sciatica 10/13/2021   Left renal mass 10/12/2021   Performance anxiety 08/17/2021   Glaucoma 08/14/2021   Insomnia 08/14/2021   Hyperlipoproteinemia 08/14/2021   BPH (benign prostatic hyperplasia) 08/14/2021   Erectile dysfunction 08/14/2021   Trigger finger, acquired 07/11/2013   S/P right THA, AA 10/25/2012   BACK PAIN, LUMBAR 01/22/2010   NONSPEC REACT TUBERCULIN SKN TEST W/O ACTV TB 06/13/2009   HEPATITIS C 02/12/2007   ALLERGIC RHINITIS 02/12/2007   History of colonic polyps 02/12/2007    ONSET DATE: 10/08/2023 (referral)   REFERRING DIAG: G82.52 (ICD-10-CM) - Quadriplegia, C1-C4 incomplete S12.300A (ICD-10-CM) - Unspecified displaced fracture of fourth cervical vertebra, initial encounter for closed fracture S14.129A (ICD-10-CM) - Central cord syndrome at  unspecified level of cervical spinal cord, initial encounter  THERAPY DIAG:  Muscle weakness (generalized)  Other abnormalities of gait and mobility  Unsteadiness on feet  Abnormal posture  Cervicalgia  Rationale for Evaluation and Treatment: Rehabilitation  SUBJECTIVE:                                                                                                                                                                                             SUBJECTIVE STATEMENT: Pt presents wearing hiking boots. Brought his home BP cuff with him today.   Drove to [place]  this weekend and it went well. Feels good today. Still having paraesthesias of his RUE, feels like there is a band around his wrist so is wearing a watch to help deal with it. No falls since last visit.    Pt accompanied by: Self   PERTINENT HISTORY: HTN, HLD, BPH, Glaucoma, Hx of hep C s/p treatment  PAIN:  Are you having pain? Yes: NPRS scale: 2-3/10  Pain location: Bilateral shoulder girdles L > R Pain description: Achy  Aggravating factors: Moving arm Relieving factors: Pain meds  PRECAUTIONS: Cervical, Fall, and Other: No lifting over 5#, foley catheter  RED FLAGS: None   WEIGHT BEARING RESTRICTIONS: No  FALLS: Has patient fallen in last 6 months? No  LIVING ENVIRONMENT: Lives with: lives with their spouse Lives in: House/apartment Stairs: Yes: Internal: 16 steps; on right going up, on left going up, and can reach both and External: 3 + 1 steps; on right going up, on left going up, and can reach both Has following equipment at home: Vannie - 4 wheeled, Shower bench, and bed side commode  PLOF: Independent  PATIENT GOALS: To be able to hike in the [place] in August and in [place] in 07/2024. I would like to be able to play the cello again and walk my dog along the trails in about a  month   OBJECTIVE:  Note: Objective measures were completed at Evaluation unless otherwise  noted.  DIAGNOSTIC FINDINGS: MRI of C-spine from 09/08/2023  IMPRESSION: 1. Signal abnormality within the ventral spinal cord at the C3-C4 level likely reflecting cord contusion. 2. Edema at site of a known acute fracture within the C4 spinous process. 3. Ligamentum flavum irregularity at C4-C5 consistent with ligamentum flavum injury, and possible ligamentum flavum disruption. 4. Edema signal within the interspinous spaces at C3-C4, C4-C5, C5-C6, C6-C7 and C7-T1 consistent with interspinous ligament injury. 5. Thinned appearance of the anterior longitudinal ligament at the C3 level suspicious for anterior longitudinal ligament injury (without complete ligamentous disruption). 6. Prevertebral edema/hematoma spanning the C2-C6 levels. 7. Cervical spondylosis as outlined within the body of the report. Moderate spinal canal stenosis at C3-C4 and C4-C5. Multilevel foraminal stenosis, greatest bilaterally at C3-C4 (moderate/severe) and on the left at C4-C5 (moderate). 8. Vertebral body and left-sided facet ankylosis at C2-C3.  COGNITION: Overall cognitive status: Within functional limits for tasks assessed   SENSATION: Stocking numbness in BLEs   POSTURE: rounded shoulders and pt wearing cervical collar  LOWER EXTREMITY ROM:     Active  Right Eval Left Eval  Hip flexion    Hip extension    Hip abduction    Hip adduction    Hip internal rotation    Hip external rotation    Knee flexion    Knee extension    Ankle dorsiflexion    Ankle plantarflexion    Ankle inversion    Ankle eversion     (Blank rows = not tested)  LOWER EXTREMITY MMT:  Tested in seated position   MMT Right Eval Left Eval  Hip flexion 4- 4-  Hip extension    Hip abduction 5 5  Hip adduction 5 5  Hip internal rotation    Hip external rotation    Knee flexion 5 5  Knee extension 5 5  Ankle dorsiflexion 5 5  Ankle plantarflexion    Ankle inversion    Ankle eversion    (Blank rows = not  tested)  BED MOBILITY:  Pt reports he needs more support to get out of his bed at home due to having to get out on the L side (was getting out on the R side in the hospital) and has difficulty propping up on L shoulder due to pain  TRANSFERS: Assistive device utilized: Environmental consultant - 4 wheeled  Sit to stand: SBA Stand to sit: SBA Chair to chair: SBA Noted decreased anterior weight shift initially w/minor lateral shift to L side. Poor eccentric control w/fatigue   STAIRS: Level of Assistance: SBA Stair Negotiation Technique: Alternating Pattern  with Bilateral Rails Number of Stairs: 4  Height of Stairs: 6  Comments: Decreased eccentric control on RLE   GAIT: Gait pattern: step through pattern, lateral hip instability, trunk flexed, and wide BOS Distance walked: Various clinic distances  Assistive device utilized: Environmental consultant - 4 wheeled and None Level of assistance: Modified independence and SBA Comments: Pt is mod I w/rollator and SBA w/no AD due to anterior instability and decreased visual scanning ability   VITALS:  Vitals:   02/01/24 1234 02/01/24 1235 02/01/24 1239 02/01/24 1240  BP: (!) 146/87 Comment: Pt's home cuff (!) 140/79 Comment: Clinic's cuff 122/76 Comment: Clinic cuff 130/81 Comment: pt's cuff  Pulse:  61 63 66     CERVICAL ROM: tested in seated position, pt w/p! In all directions   Active ROM A/ROM (deg) 12/02/23 A/ROM (  deg)  12/28/23 A/ROM (deg) 01/17/24  Flexion 24 30 41  Extension 15 30 29   Right lateral flexion 13 28 10   Left lateral flexion 8 19 10   Right rotation 11 23 21   Left rotation 21 30 27    (Blank rows = not tested)                                                                                                TREATMENT :  Self-care/home management  Assessed vitals in standing and seated positions using both clinic and pt's personal cuff (see above). Pt's cuff within 7-8 points of clinic cuff, so overall accurate. Pt's systolic BP did drop >15  points today, but diastolic BP stable. Pt asymptomatic today.   NMR  Had pt remove his shoes to work on improved ankle stability/proprioception and single leg stability in // bars:   On airex  Double calf raises w/intermittent UE support, using mirror for visual biofeedback on body position. Pt w/excessive inversion of R ankle w/activity, requiring mod multimodal cues to press up onto hallux to minimize inversion. Pt challenged with this.  Alt cone taps w/intermittent UE support and CGA, x10 taps per side. Increased difficulty tapping w/LLE. Progressed to multi-cone taps in various directions (laterally, cross-body) for added coordination and single leg stability challenge. Pt most challenged going from L to R side and prefers to tap w/RLE.  On bosu (blue side) alt step ups w/contralateral march, x15-20 reps per side w/intermittent UE support. Pt extremely challenged by this, stating he could not find his balance point. Cued pt to place weight through forefeet, not just in heel of foot as pt prefers, to improve stability. Pt w/increased inversion of RLE but better controlled if placing weight through forefoot.  W/BUE support on bar, single leg calf raises on ground, x8 reps per side. Increased difficulty stabilizing on RLE w/reduced ROM noted on LLE.  Pt reported increased pain in shoulders due to holding tension in them w/balance tasks, so discontinued balance exercises.  Had pt don shoes and performed scap retraction w/cervical extension at wall for improved postural control, cervical ROM and periscapular strength. Pt reported pain in cervical extensors w/movement, but did not radiate down arm. Min cues for proper form and to perform chin tuck rather than extend cervical spine.     PATIENT EDUCATION: Education details: Continue HEP Person educated: Patient Education method: Explanation, Demonstration, Tactile cues, and Verbal cues Education comprehension: verbalized understanding, returned  demonstration, verbal cues required, tactile cues required, and needs further education  HOME EXERCISE PROGRAM: From [employer]; [id number]  Desensitization Techniques: -Light touch/pressure:  using fingertip pressure to slowly move up small segments of the affected body part until outside the area of pain and then back to where you started -Deep pressure:  using fingertips to squeeze in small segments starting outside the affected area, crossing over the affected area, and then to the other side of the affected area -Tapping:  fingertips tap with firm pressure over the affected area, can move around the affected area as well -Brushing/scratching:  use fingertips to brush/scratch over and around the  affected area -Textures:  use soft and rough textured items like cotton balls vs wash cloths to brush or rub with light into firm pressure over and around the affected area  Repeat these 3-4x per day.   Verbally added 11/15/23: Supine serratus punch 3 x 10 reps Supine L biceps and ant delt stretch off EOB 3 x 30 sec Access Code: [email or web address] URL: [email or web address] Date: 11/22/2023 Prepared by: Marlon Jaimie Redditt  Exercises - Seated Hamstring Stretch  - 1 x daily - 7 x weekly - 1-2 reps - 60-90 seconds hold - Soleus Stretch on Wall  - 1 x daily - 7 x weekly - 60-90 seconds hold - Staggered Stance Squat  - 1 x daily - 7 x weekly - 3 sets - 10 reps - Supine Cervical Retraction with Towel  - 1 x daily - 7 x weekly - 3 sets - 10 reps - Supine Cervical Circles on Pillow  - 1 x daily - 7 x weekly - 3 sets - 10 reps - Bent Over Single Arm Shoulder Row with Dumbbell  - 1 x daily - 7 x weekly - 3 sets - 10 reps  Verbally added on 12/09/23:  - Fwd/retro/lat monster walks w/green resistance band  - Prone or modified plantigrade cervical extension w/2-3s hold   Verbally added on 12/13/23: - mod plantigrade bird dogs  -serratus wall pushes   GOALS: Goals reviewed with patient? Yes  SHORT  TERM GOALS:   Target date: 12/27/2023  Patient will score >/=25/30 (accounting for not being able to complete head turns) on FGA to demonstrate improved walking balance Baseline: 22/30; 27/30  Goal status: MET  2.  Pt will improve cervical A/ROM by 10 degrees in all planes for improved functional use of cervical spine and return to driving  Baseline: Assessed on 5/22; see chart above  Goal status: PARTIALLY MET  3.  MiniBest to be assessed and LTG updated  Baseline:  Goal status: MET   LONG TERM GOALS:  Target date: 01/10/2024  Pt will improve both left and right cervical rotation to > 40 degrees for improved functional use of cervical spine and return to driving  Baseline: 11 degrees R, 21 degrees L Goal status: NOT MET  2.  Pt will improve MiniBest to 24/28 for decreased fall risk and improvement with compensatory stepping strategies.   Baseline: 22/28 Goal status: IN PROGRESS  NEW SHORT TERM GOALS:   Target date: 02/14/2024  Pt will improve both left and right cervical rotation to > 40 degrees for improved functional use of cervical spine and return to driving  Baseline:  Active ROM A/ROM (deg) 12/02/23 A/ROM (deg)  12/28/23 A/ROM (deg) 01/17/24  Flexion 24 30 41  Extension 15 30 29   Right lateral flexion 13 28 10   Left lateral flexion 8 19 10   Right rotation 11 23 21   Left rotation 21 30 27    (Blank rows = not tested)  Goal status: INITIAL  2.  Pt will improve MiniBest to 24/28 for decreased fall risk and improvement with compensatory stepping strategies.  Baseline: 22/28 Goal status: IN PROGRESS   NEW LONG TERM GOALS:  Target date: 03/13/2024  Pt will improve left and right cervical lateral flexion to >/= 25 degrees for improved functional use of cervical spine and relaxation of upper traps  Baseline:  Active ROM A/ROM (deg) 12/02/23 A/ROM (deg)  12/28/23 A/ROM (deg) 01/17/24  Flexion 24 30 41  Extension 15 30 29   Right lateral flexion 13 28 10  Left lateral flexion  8 19 10   Right rotation 11 23 21   Left rotation 21 30 27    (Blank rows = not tested)  Goal status: INITIAL  2.  Pt will hold single leg stance on BLEs without shoes on for >/= 10s for improved ankle stability and return to hiking.  Baseline:  Goal status: INITIAL     ASSESSMENT:  CLINICAL IMPRESSION: Emphasis of skilled PT session on single leg stability, improved ankle stability/proprioception and postural control. Pt brought in personal BP cuff to compare to clinic cuff, which was within ~7-8 points of clinic cuff. Pt's systolic BP did drop in standing today, but diastolic BP remained stable and pt asymptomatic. Performed majority of session without shoes on as pt tends to wear high-top hiking boots. Pt demonstrates excessive inversion of R ankle w/difficulty placing weight in forefeet, likely contributing to his tendency to lose balance posteriorly. Will continue to work on ankle stability and anterior weight shifting in PT for reduced fall risk and return to hiking. Continue POC.     OBJECTIVE IMPAIRMENTS: Abnormal gait, decreased activity tolerance, decreased balance, decreased coordination, decreased endurance, decreased mobility, decreased strength, dizziness, impaired sensation, impaired UE functional use, improper body mechanics, and pain  ACTIVITY LIMITATIONS: carrying, lifting, bending, squatting, transfers, bed mobility, toileting, dressing, reach over head, hygiene/grooming, locomotion level, and caring for others  PARTICIPATION LIMITATIONS: meal prep, cleaning, laundry, medication management, driving, shopping, community activity, occupation, and yard work  PERSONAL FACTORS: Fitness, Past/current experiences, and 1 comorbidity: quadriplegia are also affecting patient's functional outcome.   REHAB POTENTIAL: Excellent  CLINICAL DECISION MAKING: Evolving/moderate complexity  EVALUATION COMPLEXITY: Moderate  PLAN:  PT FREQUENCY: 1-2x/week (recert)   PT DURATION: 6  weeks + 6 weeks + 8 weeks (recert)  PLANNED INTERVENTIONS: 02835- PT Re-evaluation, 97110-Therapeutic exercises, 97530- Therapeutic activity, 97112- Neuromuscular re-education, 97535- Self Care, 02859- Manual therapy, [phone]- Gait training, [phone]- Orthotic Fit/training, [phone]- Aquatic Therapy, [phone]- Electrical stimulation (manual), Patient/Family education, Balance training, Stair training, Dry Needling, Joint mobilization, Spinal mobilization, Vestibular training, and DME instructions  PLAN FOR NEXT SESSION: SEND AUTH FOR [employer]. Monitor vitals- pt very hypotensive. Work on return to hiking; NBOS, eyes closed, eccentric control, endurance, response to DN? L shoulder periscapular strength, does he want to schedule again with TT for DN of his L biceps and ant delt? Posterior stability (pt loses balance w/toe raises), cervical ROM. Revise HEP. Anterior weight shift, ankle proprioception    Kallen Mccrystal E Brantly Kalman, PT, DPT 02/01/2024, 1:35 PM

## 2024-02-07 DIAGNOSIS — N319 Neuromuscular dysfunction of bladder, unspecified: Secondary | ICD-10-CM | POA: Diagnosis not present

## 2024-02-09 ENCOUNTER — Encounter: Payer: Self-pay | Admitting: Physical Medicine and Rehabilitation

## 2024-02-14 ENCOUNTER — Encounter: Payer: Self-pay | Admitting: Internal Medicine

## 2024-02-14 DIAGNOSIS — S12301D Unspecified nondisplaced fracture of fourth cervical vertebra, subsequent encounter for fracture with routine healing: Secondary | ICD-10-CM

## 2024-02-15 ENCOUNTER — Encounter: Payer: Self-pay | Admitting: Physical Medicine and Rehabilitation

## 2024-02-15 MED ORDER — MORPHINE SULFATE ER 15 MG PO TBCR
15.0000 mg | EXTENDED_RELEASE_TABLET | Freq: Two times a day (BID) | ORAL | 0 refills | Status: DC
Start: 2024-02-15 — End: 2024-03-27

## 2024-02-16 ENCOUNTER — Ambulatory Visit: Payer: Self-pay | Attending: Physician Assistant | Admitting: Physical Therapy

## 2024-02-16 VITALS — BP 127/83 | HR 62

## 2024-02-16 DIAGNOSIS — R2689 Other abnormalities of gait and mobility: Secondary | ICD-10-CM | POA: Insufficient documentation

## 2024-02-16 DIAGNOSIS — R29818 Other symptoms and signs involving the nervous system: Secondary | ICD-10-CM | POA: Insufficient documentation

## 2024-02-16 DIAGNOSIS — R208 Other disturbances of skin sensation: Secondary | ICD-10-CM | POA: Diagnosis not present

## 2024-02-16 DIAGNOSIS — M542 Cervicalgia: Secondary | ICD-10-CM | POA: Insufficient documentation

## 2024-02-16 DIAGNOSIS — R293 Abnormal posture: Secondary | ICD-10-CM | POA: Diagnosis not present

## 2024-02-16 DIAGNOSIS — M6281 Muscle weakness (generalized): Secondary | ICD-10-CM | POA: Insufficient documentation

## 2024-02-16 DIAGNOSIS — M25512 Pain in left shoulder: Secondary | ICD-10-CM | POA: Insufficient documentation

## 2024-02-16 DIAGNOSIS — R278 Other lack of coordination: Secondary | ICD-10-CM | POA: Insufficient documentation

## 2024-02-16 DIAGNOSIS — G8929 Other chronic pain: Secondary | ICD-10-CM | POA: Diagnosis not present

## 2024-02-16 DIAGNOSIS — R2681 Unsteadiness on feet: Secondary | ICD-10-CM | POA: Diagnosis not present

## 2024-02-16 NOTE — Therapy (Signed)
 OUTPATIENT PHYSICAL THERAPY NEURO TREATMENT   Patient Name: Brandon Watts MRN: [id number] DOB:[birth year], 78 y.o., male Today's Date: 02/16/2024   PCP: Geofm Glade PARAS, MD REFERRING PROVIDER: Pegge Toribio PARAS, PA-C   END OF SESSION:  PT End of Session - 02/16/24 1535     Visit Number 22    Number of Visits 27   Recert   Date for PT Re-Evaluation 03/13/24   recert   Authorization Type [employer]    Authorization Time Period 4 approved visits (PT) 01/25/24 - 02/08/24    PT Start Time 1535    PT Stop Time 1615    PT Time Calculation (min) 40 min    Equipment Utilized During Treatment Gait belt    Activity Tolerance Patient tolerated treatment well    Behavior During Therapy WFL for tasks assessed/performed                 Past Medical History:  Diagnosis Date   Allergic rhinitis    Arthritis    PAIN AND OA RIGHT HIP   Arthritis    BPH (benign prostatic hyperplasia)    DDD (degenerative disc disease), lumbar    MILD-FOUND ON XRAY   Glaucoma    normotensive type with early changes   Glaucoma    Hepatitis    HEPATITIS C -CLEARED   Hepatitis    Hep C, treated and cleared   HLD (hyperlipidemia)    taking statin as preventative   Hypertension    Insomnia    CHRONIC   Insomnia    Positive PPD, treated    Pulmonary nodules    FOLLOWED BY DR. LAMAR BROCKS GATES WITH CT EXAMS-MOST RECENT 01/22/12   Status post trigger finger release    right   Past Surgical History:  Procedure Laterality Date   ANTERIOR CERVICAL DECOMP/DISCECTOMY FUSION N/A 09/09/2023   Procedure: ANTERIOR CERVICAL DECOMPRESSION/DISCECTOMY FUSION 2 LEVEL/HARDWARE REMOVAL - C 3-4 , C4-5;  Surgeon: Debby Dorn MATSU, MD;  Location: [facility];  Service: Neurosurgery;  Laterality: N/A;   EYE SURGERY     LASIK EYE SURGERY   LITHOTRIPSY     x 3   LITHOTRIPSY     x2   TONSILLECTOMY     AS A CHILD   TONSILLECTOMY     TOTAL HIP ARTHROPLASTY Right 10/25/2012   Procedure: RIGHT TOTAL HIP ARTHROPLASTY  ANTERIOR APPROACH;  Surgeon: Donnice JONETTA Car, MD;  Location: [facility];  Service: Orthopedics;  Laterality: Right;   TOTAL HIP ARTHROPLASTY Right    TRIGGER FINGER RELEASE Right 2016   third digit right hand. Dr.Supple   URETERAL STONE EXTRACTION S X 2     VASCECTOMY     Patient Active Problem List   Diagnosis Date Noted   Hypotension 10/22/2023   Gross hematuria 10/22/2023   Constipation 10/22/2023   Quadriplegia, C1-C4, incomplete (HCC) 09/18/2023   C4 cervical fracture (HCC) 09/17/2023   Central cord syndrome (HCC) 09/17/2023   Dysphagia 09/17/2023   Respiratory failure with hypoxia (HCC) 09/17/2023   SDH (subdural hematoma) (HCC) 09/17/2023   Malnutrition of moderate degree 09/15/2023   Acute pulmonary embolism without acute cor pulmonale (HCC) 09/15/2023   Post-nasal drainage 09/15/2023   Pneumonia of left lower lobe due to infectious organism 09/15/2023   Hyperlipidemia 09/08/2023   Diastolic dysfunction without heart failure 09/17/2022   Episodic lightheadedness 08/26/2022   Vasovagal near syncope 08/26/2022   Hypercalcemia 08/25/2022   Hyperglycemia 08/25/2022   Acute left-sided low back pain with  left-sided sciatica 10/13/2021   Left renal mass 10/12/2021   Performance anxiety 08/17/2021   Glaucoma 08/14/2021   Insomnia 08/14/2021   Hyperlipoproteinemia 08/14/2021   BPH (benign prostatic hyperplasia) 08/14/2021   Erectile dysfunction 08/14/2021   Trigger finger, acquired 07/11/2013   S/P right THA, AA 10/25/2012   BACK PAIN, LUMBAR 01/22/2010   NONSPEC REACT TUBERCULIN SKN TEST W/O ACTV TB 06/13/2009   HEPATITIS C 02/12/2007   ALLERGIC RHINITIS 02/12/2007   History of colonic polyps 02/12/2007    ONSET DATE: 10/08/2023 (referral)   REFERRING DIAG: G82.52 (ICD-10-CM) - Quadriplegia, C1-C4 incomplete S12.300A (ICD-10-CM) - Unspecified displaced fracture of fourth cervical vertebra, initial encounter for closed fracture S14.129A (ICD-10-CM) - Central cord syndrome at  unspecified level of cervical spinal cord, initial encounter  THERAPY DIAG:  Muscle weakness (generalized)  Other abnormalities of gait and mobility  Unsteadiness on feet  Abnormal posture  Cervicalgia  Left shoulder pain, unspecified chronicity  Rationale for Evaluation and Treatment: Rehabilitation  SUBJECTIVE:                                                                                                                                                                                             SUBJECTIVE STATEMENT: Pt reports that on Monday 7/28 in the evening he was grooming his dog and he was trying to transition from half-kneel to tall-kneel and fell backwards, hyperextended his neck and hit his head on the floor. He reports that initially he didn't notice any changes but at 3 AM he was woken up by uncontrolled spasms in his whole body. Since then he has had increased spasticity, increased pain that is not controlled by his medication, increased issues with his balance and gait, and has had a loss of strength in his arms and in his hands. He reports that he is also now having pain in both shoulders, limited mobility in his L shoulder with onset of impingement if he tries to reach out to the side. He also reports that his skin hypersensitivity is worse, shoulder girdle pain is worse, and his parasthesias are worse.  Pt did message Dr. Lovorn through MyChart about his fall and his new symptoms, he has a follow-up with her on Monday 02/21/24. He did not seek immediate medical care after his fall and after these change in symptoms and has not had any new imaging performed.  Pt reports that he spends his days, either sleeping, dopey (from meds), or in pain. He is trying to stay busy and do as much as he can physically including doing yard work, walking his dog, etc. He continues to drive himself  to appointments but reports increased difficulty turning his head when driving.  Pt  accompanied by: Self   PERTINENT HISTORY: HTN, HLD, BPH, Glaucoma, Hx of hep C s/p treatment  PAIN:  Are you having pain? Yes: NPRS scale: 2-3/10  Pain location: Bilateral shoulder girdles L > R Pain description: Achy  Aggravating factors: Moving arm Relieving factors: Pain meds  PRECAUTIONS: Cervical, Fall, and Other: No lifting over 5#, foley catheter  RED FLAGS: None   WEIGHT BEARING RESTRICTIONS: No  FALLS: Has patient fallen in last 6 months? No  LIVING ENVIRONMENT: Lives with: lives with their spouse Lives in: House/apartment Stairs: Yes: Internal: 16 steps; on right going up, on left going up, and can reach both and External: 3 + 1 steps; on right going up, on left going up, and can reach both Has following equipment at home: Vannie - 4 wheeled, Shower bench, and bed side commode  PLOF: Independent  PATIENT GOALS: To be able to hike in the [place] in August and in [place] in 07/2024. I would like to be able to play the cello again and walk my dog along the trails in about a month   OBJECTIVE:  Note: Objective measures were completed at Evaluation unless otherwise noted.  DIAGNOSTIC FINDINGS: MRI of C-spine from 09/08/2023  IMPRESSION: 1. Signal abnormality within the ventral spinal cord at the C3-C4 level likely reflecting cord contusion. 2. Edema at site of a known acute fracture within the C4 spinous process. 3. Ligamentum flavum irregularity at C4-C5 consistent with ligamentum flavum injury, and possible ligamentum flavum disruption. 4. Edema signal within the interspinous spaces at C3-C4, C4-C5, C5-C6, C6-C7 and C7-T1 consistent with interspinous ligament injury. 5. Thinned appearance of the anterior longitudinal ligament at the C3 level suspicious for anterior longitudinal ligament injury (without complete ligamentous disruption). 6. Prevertebral edema/hematoma spanning the C2-C6 levels. 7. Cervical spondylosis as outlined within the body of the  report. Moderate spinal canal stenosis at C3-C4 and C4-C5. Multilevel foraminal stenosis, greatest bilaterally at C3-C4 (moderate/severe) and on the left at C4-C5 (moderate). 8. Vertebral body and left-sided facet ankylosis at C2-C3.  COGNITION: Overall cognitive status: Within functional limits for tasks assessed   SENSATION: Stocking numbness in BLEs   POSTURE: rounded shoulders and pt wearing cervical collar  LOWER EXTREMITY ROM:     Active  Right Eval Left Eval  Hip flexion    Hip extension    Hip abduction    Hip adduction    Hip internal rotation    Hip external rotation    Knee flexion    Knee extension    Ankle dorsiflexion    Ankle plantarflexion    Ankle inversion    Ankle eversion     (Blank rows = not tested)  LOWER EXTREMITY MMT:  Tested in seated position   MMT Right Eval Left Eval  Hip flexion 4- 4-  Hip extension    Hip abduction 5 5  Hip adduction 5 5  Hip internal rotation    Hip external rotation    Knee flexion 5 5  Knee extension 5 5  Ankle dorsiflexion 5 5  Ankle plantarflexion    Ankle inversion    Ankle eversion    (Blank rows = not tested)  BED MOBILITY:  Pt reports he needs more support to get out of his bed at home due to having to get out on the L side (was getting out on the R side in the hospital) and has difficulty propping up  on L shoulder due to pain  TRANSFERS: Assistive device utilized: Walker - 4 wheeled  Sit to stand: SBA Stand to sit: SBA Chair to chair: SBA Noted decreased anterior weight shift initially w/minor lateral shift to L side. Poor eccentric control w/fatigue   STAIRS: Level of Assistance: SBA Stair Negotiation Technique: Alternating Pattern  with Bilateral Rails Number of Stairs: 4  Height of Stairs: 6  Comments: Decreased eccentric control on RLE   GAIT: Gait pattern: step through pattern, lateral hip instability, trunk flexed, and wide BOS Distance walked: Various clinic distances  Assistive  device utilized: Environmental consultant - 4 wheeled and None Level of assistance: Modified independence and SBA Comments: Pt is mod I w/rollator and SBA w/no AD due to anterior instability and decreased visual scanning ability   VITALS:  Vitals:   02/16/24 1551  BP: 127/83  Pulse: 62      CERVICAL ROM: tested in seated position, pt w/p! In all directions   Active ROM A/ROM (deg) 12/02/23 A/ROM (deg)  12/28/23 A/ROM (deg) 01/17/24  Flexion 24 30 41  Extension 15 30 29   Right lateral flexion 13 28 10   Left lateral flexion 8 19 10   Right rotation 11 23 21   Left rotation 21 30 27    (Blank rows = not tested)                                                                                                TREATMENT :  Self-care/home management  Assessed vitals in seated positions today using clinic cuff (see above). Vitals WNL and pt asymptomatic, reports his readings have improved at home as well (less orthostatic). Discussed his worsening of symptoms following his fall last week and concern regarding this. Encouraged patient to go to the ED to have imaging performed due to his fall and having increased weakness and increased nerve symptoms. Pt refuses to go to the ED at this time, he wants to wait until his follow-up with Dr. Lovorn next week and see if she can order updated imaging.   TherAct For STG assessment: Unable to safely assess STG this session despite goal due date and need for re-authorization from patient insurance company due to worsening of his SCI symptoms this date following a fall. Unsafe to reassess cervical AROM until further imaging is performed to rule out further injury and determine if he needs to have cervical precautions in place again. Additionally, unsafe to assess miniBEST until he has follow-up with referring physician and is cleared to resume PT. Pt with multiple falls since last PT visit and he is too medically fragile to be reassessed for authorization at this time.  Additionally, he trips walking from clinic lobby to treatment room, catching his foot on the floor, needs min A from therapist to prevent a fall. Plan to reassess outcome measures next visit after he has a follow-up with his referring PM&R physician.    PATIENT EDUCATION: Education details: go to ED for updated imaging following a fall, unsafe to reassess goals or participate in PT this session Person educated: Patient Education method: Explanation Education comprehension: verbalized understanding  and needs further education  HOME EXERCISE PROGRAM: From [employer]; [id number]  Desensitization Techniques: -Light touch/pressure:  using fingertip pressure to slowly move up small segments of the affected body part until outside the area of pain and then back to where you started -Deep pressure:  using fingertips to squeeze in small segments starting outside the affected area, crossing over the affected area, and then to the other side of the affected area -Tapping:  fingertips tap with firm pressure over the affected area, can move around the affected area as well -Brushing/scratching:  use fingertips to brush/scratch over and around the affected area -Textures:  use soft and rough textured items like cotton balls vs wash cloths to brush or rub with light into firm pressure over and around the affected area  Repeat these 3-4x per day.   Verbally added 11/15/23: Supine serratus punch 3 x 10 reps Supine L biceps and ant delt stretch off EOB 3 x 30 sec Access Code: [email or web address] URL: [email or web address] Date: 11/22/2023 Prepared by: Marlon Plaster  Exercises - Seated Hamstring Stretch  - 1 x daily - 7 x weekly - 1-2 reps - 60-90 seconds hold - Soleus Stretch on Wall  - 1 x daily - 7 x weekly - 60-90 seconds hold - Staggered Stance Squat  - 1 x daily - 7 x weekly - 3 sets - 10 reps - Supine Cervical Retraction with Towel  - 1 x daily - 7 x weekly - 3 sets - 10 reps - Supine  Cervical Circles on Pillow  - 1 x daily - 7 x weekly - 3 sets - 10 reps - Bent Over Single Arm Shoulder Row with Dumbbell  - 1 x daily - 7 x weekly - 3 sets - 10 reps  Verbally added on 12/09/23:  - Fwd/retro/lat monster walks w/green resistance band  - Prone or modified plantigrade cervical extension w/2-3s hold   Verbally added on 12/13/23: - mod plantigrade bird dogs  -serratus wall pushes   GOALS: Goals reviewed with patient? Yes  SHORT TERM GOALS:   Target date: 12/27/2023  Patient will score >/=25/30 (accounting for not being able to complete head turns) on FGA to demonstrate improved walking balance Baseline: 22/30; 27/30  Goal status: MET  2.  Pt will improve cervical A/ROM by 10 degrees in all planes for improved functional use of cervical spine and return to driving  Baseline: Assessed on 5/22; see chart above  Goal status: PARTIALLY MET  3.  MiniBest to be assessed and LTG updated  Baseline:  Goal status: MET   LONG TERM GOALS:  Target date: 01/10/2024  Pt will improve both left and right cervical rotation to > 40 degrees for improved functional use of cervical spine and return to driving  Baseline: 11 degrees R, 21 degrees L Goal status: NOT MET  2.  Pt will improve MiniBest to 24/28 for decreased fall risk and improvement with compensatory stepping strategies.   Baseline: 22/28 Goal status: IN PROGRESS  NEW SHORT TERM GOALS:   Target date: 02/14/2024 - unsafe to reassess on 8/6, will reassess next scheduled visit 8/12  Pt will improve both left and right cervical rotation to > 40 degrees for improved functional use of cervical spine and return to driving  Baseline:  Active ROM A/ROM (deg) 12/02/23 A/ROM (deg)  12/28/23 A/ROM (deg) 01/17/24  Flexion 24 30 41  Extension 15 30 29   Right lateral flexion 13 28 10   Left lateral flexion 8 19 10  Right rotation 11 23 21   Left rotation 21 30 27    (Blank rows = not tested)  Goal status: INITIAL  2.  Pt will improve  MiniBest to 24/28 for decreased fall risk and improvement with compensatory stepping strategies.  Baseline: 22/28 Goal status: IN PROGRESS   NEW LONG TERM GOALS:  Target date: 03/13/2024  Pt will improve left and right cervical lateral flexion to >/= 25 degrees for improved functional use of cervical spine and relaxation of upper traps  Baseline:  Active ROM A/ROM (deg) 12/02/23 A/ROM (deg)  12/28/23 A/ROM (deg) 01/17/24  Flexion 24 30 41  Extension 15 30 29   Right lateral flexion 13 28 10   Left lateral flexion 8 19 10   Right rotation 11 23 21   Left rotation 21 30 27    (Blank rows = not tested)  Goal status: INITIAL  2.  Pt will hold single leg stance on BLEs without shoes on for >/= 10s for improved ankle stability and return to hiking.  Baseline:  Goal status: INITIAL     ASSESSMENT:  CLINICAL IMPRESSION: Session limited by worsening of symptoms from patient's SCI following a fall last week. Emphasis of skilled PT session on discussing this therapist and patient's primary PT's recommendations that he seek emergency medical care following his fall and with worsening of his symptoms. Pt refuses to go to the ED to have updated imaging performed and prefers to wait to follow-up with Dr. Cornelio at his scheduled appointment next week. Plan to reassess goals and outcome measures next session pending clearance from Dr. Lovorn and updated precautions if needed. Unable to safely assess STG this session despite goal due date and need for re-authorization from patient insurance company due to worsening of his SCI symptoms this date following a fall. Unsafe to reassess cervical AROM until further imaging is performed to rule out further injury and determine if he needs to have cervical precautions in place again. Additionally, unsafe to assess miniBEST until he has follow-up with referring physician and is cleared to resume PT. Pt with multiple falls since last PT visit and he is too medically fragile  to be reassessed for authorization at this time but continues to require skilled PT services due to ongoing muscle weakness, balance impairments, and remaining a very high fall risk. Continue POC.     OBJECTIVE IMPAIRMENTS: Abnormal gait, decreased activity tolerance, decreased balance, decreased coordination, decreased endurance, decreased mobility, decreased strength, dizziness, impaired sensation, impaired UE functional use, improper body mechanics, and pain  ACTIVITY LIMITATIONS: carrying, lifting, bending, squatting, transfers, bed mobility, toileting, dressing, reach over head, hygiene/grooming, locomotion level, and caring for others  PARTICIPATION LIMITATIONS: meal prep, cleaning, laundry, medication management, driving, shopping, community activity, occupation, and yard work  PERSONAL FACTORS: Fitness, Past/current experiences, and 1 comorbidity: quadriplegia are also affecting patient's functional outcome.   REHAB POTENTIAL: Excellent  CLINICAL DECISION MAKING: Evolving/moderate complexity  EVALUATION COMPLEXITY: Moderate  PLAN:  PT FREQUENCY: 1-2x/week (recert)   PT DURATION: 6 weeks + 6 weeks + 8 weeks (recert)  PLANNED INTERVENTIONS: 02835- PT Re-evaluation, 97110-Therapeutic exercises, 97530- Therapeutic activity, 97112- Neuromuscular re-education, 97535- Self Care, 02859- Manual therapy, [phone]- Gait training, [phone]- Orthotic Fit/training, [phone]- Aquatic Therapy, [phone]- Electrical stimulation (manual), Patient/Family education, Balance training, Stair training, Dry Needling, Joint mobilization, Spinal mobilization, Vestibular training, and DME instructions  PLAN FOR NEXT SESSION: SEND AUTH FOR [employer]. Monitor vitals- pt very hypotensive. Work on return to hiking; NBOS, eyes closed, eccentric control, endurance, response to DN? L shoulder  periscapular strength, does he want to schedule again with TT for DN of his L biceps and ant delt? Posterior stability (pt loses balance w/toe  raises), cervical ROM. Revise HEP. Anterior weight shift, ankle proprioception, update from Dr. Cornelio - any new imaging or precautions?   Felcia Huebert, PT [facility] [place], PT, DPT, CSRS  02/16/2024, 4:19 PM

## 2024-02-21 ENCOUNTER — Encounter: Attending: Physical Medicine and Rehabilitation | Admitting: Physical Medicine and Rehabilitation

## 2024-02-21 ENCOUNTER — Encounter: Payer: Self-pay | Admitting: Physical Medicine and Rehabilitation

## 2024-02-21 VITALS — BP 146/83 | HR 65 | Ht 68.0 in | Wt 151.4 lb

## 2024-02-21 DIAGNOSIS — N319 Neuromuscular dysfunction of bladder, unspecified: Secondary | ICD-10-CM | POA: Insufficient documentation

## 2024-02-21 DIAGNOSIS — S14129D Central cord syndrome at unspecified level of cervical spinal cord, subsequent encounter: Secondary | ICD-10-CM | POA: Diagnosis not present

## 2024-02-21 DIAGNOSIS — R252 Cramp and spasm: Secondary | ICD-10-CM | POA: Insufficient documentation

## 2024-02-21 DIAGNOSIS — G8252 Quadriplegia, C1-C4 incomplete: Secondary | ICD-10-CM | POA: Diagnosis not present

## 2024-02-21 MED ORDER — MIDODRINE HCL 2.5 MG PO TABS: 2.5000 mg | ORAL_TABLET | Freq: Two times a day (BID) | ORAL | 5 refills | Status: DC

## 2024-02-21 MED ORDER — TRAMADOL HCL 50 MG PO TABS: 100.0000 mg | ORAL_TABLET | Freq: Two times a day (BID) | ORAL | 5 refills | Status: DC | PRN

## 2024-02-21 NOTE — Patient Instructions (Signed)
 Pt is a 78 yr old male with hx of Incomplete quadriplegia with Central cord syndrome ASIA D- with C4 fx s/p C3/4/5 fusion- neurogenic bladder- foley, LUL PE on Eliquis ; Chronic pain on MS Contin ; Severe orthostatic hypotension, B/L shoulder pain;    Patients get acclimated usually to BP- reduce Midodrine  to 5 mg 2x/day x 1 week, then reduce to 2.5 mg 2x/day- for 1 week, then make as needed.     2.   We discussed at length about going to [place]-  Will see him in late September to determine if possible.     3.    Will increase Tramadol  to 100 mg BID prn. Can have MS Contin  from PCP-    4.  Let's make Seroquel  as needed- and if taking 4 nights/week, then will make scheduled.    5.  We discussed steroids- since getting better, doesn't want side effects of steroids.      6. Can be cleared for PT and OT- based on chart.      7.  Wife has new breast CA dx.  I'm so sorry.    8. F/U- in 6 weeks single appt-  and 3 months double appt- SCI

## 2024-02-21 NOTE — Progress Notes (Signed)
 Subjective:    Patient ID: Brandon Watts, male    DOB: 09/26/1945, 78 y.o.   MRN: [id number]  HPI  Pt is a 78 yr old male with hx of Incomplete quadriplegia with Central cord syndrome ASIA D- with C4 fx s/p C3/4/5 fusion- neurogenic bladder- foley, LUL PE on Eliquis ; Chronic pain on MS Contin ; Severe orthostatic hypotension, B/L shoulder pain;     Couldn't get in with [facility]-  Had a standing appt on 8/22- if has a concern, go to ED.   Not back to where he was on 7/27- is improving- things coming back.  Pain distribution has changed- used to be shoulder girdle- mor eon L.   TTP over upper traps and rhomboids and still some worse discomfort on R neck.   Paraesthesias are more dense. In ulnar neuropathy- now coming across palm- getting worse. More hypersensitive in in amrs, legs back but is easing off.   Resting a lot-  Wouldn't work with him last week  Doing little of HEP- not lifting weights- using therabands.  Working on gait and balance. And rowing Exercises.  A lot of isometrics and stretching.  Not getting on floor to do HEP either   Was kneeling to groom the dog and moving into sitting position- and then lost balance and fell-  Dog is 47 lbs-   BP- variable- lately- was 146/76- 1.5 hours after midodrine ,  Before midodrine  110s-120s. Sitting and standing.  No orthostatic drop anymore.   Walked 2.5 miles- hiking 30 mini mile-  hiking yesterday and mowed lawn 1.5 hours yesterday.   Before Accident 3-3.5 miles/hour-   Reduced cello use secondary to L shoulder pain.   Didn't take MS Contin  this AM-  Makes him dopey. Not good for anything til afternoon.  Very hard concentrating. Was wondering I should switch to Tramadol  during day and MS Contin  at night.   Sleeps pretty well- sometimes wakes form pain,  3-4 am-  Will get up if woken at 4am- takes celebrex  and tramadol -   Takes Baclofen  at bedtime - and if has more spasms- occ-rarely.  Gets small spasms in muscles-   2-3x/week will take baclofen .   Has SPC catheter- rare bladder spasms now- when capped for voiding trial-  mainly when capped off.   Can go 4 hours capped off- and will void small amounts!  Took 3rd visit- horrific pain- irritation of bladder - didn't lead him to [facility].  2 weeks of hell. Per wife.  And a lot of bleeding when took foley out- urethral injury.   Nathanel concerned about posture - wanted to know about posture brace.   Pain Inventory Average Pain 6 Pain Right Now 4 My pain is sharp, burning, and tingling, constant  In the last 24 hours, has pain interfered with the following? General activity 5 Relation with others 1 Enjoyment of life 5 What TIME of day is your pain at its worst? morning , daytime, evening, and night Sleep (in general) Fair  Pain is worse with: walking, bending, sitting, standing, and some activites Pain improves with: medication Relief from Meds: 8  Family History  Problem Relation Age of Onset   Hypertension Mother    Alcohol abuse Mother    Esophageal varices Mother    Depression Mother    Colon cancer Father    Hypertension Maternal Grandmother    CVA Maternal Grandmother    Heart disease Maternal Grandfather    Lung cancer Half-Sister    Social History  Socioeconomic History   Marital status: Married    Spouse name: helen katherine   Number of children: 2   Years of education: 22   Highest education level: Professional school degree (e.g., MD, DDS, DVM, JD)  Occupational History   Occupation: physician    Employer: [employer]  Tobacco Use   Smoking status: Former    Current packs/day: 0.00    Average packs/day: 2.0 packs/day for 26.0 years (52.0 ttl pk-yrs)    Types: Cigarettes    Start date: 73    Quit date: 1988    Years since quitting: 37.6   Smokeless tobacco: Never   Tobacco comments:    QUIT SMOKING 1981  Vaping Use   Vaping status: Never Used  Substance and Sexual Activity   Alcohol use: Yes    Alcohol/week: 1.0  standard drink of alcohol    Types: 1 Standard drinks or equivalent per week    Comment: 1&1/2 OZ DAILY   Drug use: Never   Sexual activity: Yes  Other Topics Concern   Not on file  Social History Narrative   ** Merged History Encounter **       Social Drivers of Health   Financial Resource Strain: Low Risk  (10/25/2023)   Received from [facility]   Overall Financial Resource Strain (CARDIA)    Difficulty of Paying Living Expenses: Not hard at all  Food Insecurity: No Food Insecurity (01/11/2024)   Received from [facility]   Hunger Vital Sign    Within the past 12 months, you worried that your food would run out before you got the money to buy more.: Never true    Within the past 12 months, the food you bought just didn't last and you didn't have money to get more.: Never true  Transportation Needs: No Transportation Needs (01/11/2024)   Received from [facility] - Transportation    Lack of Transportation (Medical): No    Lack of Transportation (Non-Medical): No  Physical Activity: Unknown (10/25/2023)   [facility]   Exercise Vital Sign    On average, how many days per week do you engage in moderate to strenuous exercise (like a brisk walk)?: 0 days    Minutes of Exercise per Session: Not on file  Stress: No Stress Concern Present (01/11/2024)   Received from [facility] - Occupational Stress Questionnaire    Feeling of Stress : Not at all  Recent Concern: Stress - Stress Concern Present (10/25/2023)   Received from [facility] - Occupational Stress Questionnaire    Feeling of Stress : To some extent  Social Connections: Socially Integrated (10/25/2023)   Received from [facility]   Social Network    How would you rate your social network (family, work, friends)?: Good participation with social networks   Past Surgical History:  Procedure Laterality Date    ANTERIOR CERVICAL DECOMP/DISCECTOMY FUSION N/A 09/09/2023   Procedure: ANTERIOR CERVICAL DECOMPRESSION/DISCECTOMY FUSION 2 LEVEL/HARDWARE REMOVAL - C 3-4 , C4-5;  Surgeon: Debby Dorn MATSU, MD;  Location: [facility] OR;  Service: Neurosurgery;  Laterality: N/A;   EYE SURGERY     LASIK EYE SURGERY   LITHOTRIPSY     x 3   LITHOTRIPSY     x2   TONSILLECTOMY     AS A CHILD   TONSILLECTOMY     TOTAL HIP ARTHROPLASTY Right 10/25/2012   Procedure: RIGHT  TOTAL HIP ARTHROPLASTY ANTERIOR APPROACH;  Surgeon: Donnice JONETTA Car, MD;  Location: [facility] ORS;  Service: Orthopedics;  Laterality: Right;   TOTAL HIP ARTHROPLASTY Right    TRIGGER FINGER RELEASE Right 2016   third digit right hand. Dr.Supple   URETERAL STONE EXTRACTION S X 2     VASCECTOMY     Past Surgical History:  Procedure Laterality Date   ANTERIOR CERVICAL DECOMP/DISCECTOMY FUSION N/A 09/09/2023   Procedure: ANTERIOR CERVICAL DECOMPRESSION/DISCECTOMY FUSION 2 LEVEL/HARDWARE REMOVAL - C 3-4 , C4-5;  Surgeon: Debby Dorn MATSU, MD;  Location: [facility] OR;  Service: Neurosurgery;  Laterality: N/A;   EYE SURGERY     LASIK EYE SURGERY   LITHOTRIPSY     x 3   LITHOTRIPSY     x2   TONSILLECTOMY     AS A CHILD   TONSILLECTOMY     TOTAL HIP ARTHROPLASTY Right 10/25/2012   Procedure: RIGHT TOTAL HIP ARTHROPLASTY ANTERIOR APPROACH;  Surgeon: Donnice JONETTA Car, MD;  Location: [facility] ORS;  Service: Orthopedics;  Laterality: Right;   TOTAL HIP ARTHROPLASTY Right    TRIGGER FINGER RELEASE Right 2016   third digit right hand. Dr.Supple   URETERAL STONE EXTRACTION S X 2     VASCECTOMY     Past Medical History:  Diagnosis Date   Allergic rhinitis    Arthritis    PAIN AND OA RIGHT HIP   Arthritis    BPH (benign prostatic hyperplasia)    DDD (degenerative disc disease), lumbar    MILD-FOUND ON XRAY   Glaucoma    normotensive type with early changes   Glaucoma    Hepatitis    HEPATITIS C -CLEARED   Hepatitis    Hep C, treated and cleared   HLD  (hyperlipidemia)    taking statin as preventative   Hypertension    Insomnia    CHRONIC   Insomnia    Positive PPD, treated    Pulmonary nodules    FOLLOWED BY DR. LAMAR BROCKS GATES WITH CT EXAMS-MOST RECENT 01/22/12   Status post trigger finger release    right   BP (!) 146/83 (BP Location: Right Arm, Patient Position: Sitting, Cuff Size: Normal)   Pulse 65   Ht 5' 8 (1.727 m)   Wt 151 lb 6.4 oz (68.7 kg)   SpO2 96%   BMI 23.02 kg/m   Opioid Risk Score:   Fall Risk Score:  `1  Depression screen [facility] 2/9     02/21/2024    9:13 AM 11/08/2023   11:30 AM 08/26/2022    8:25 AM 10/09/2021   11:36 AM 08/17/2021    8:54 AM  Depression screen PHQ 2/9  Decreased Interest 0 1 0 0 0  Down, Depressed, Hopeless 0 1 0 0 0  PHQ - 2 Score 0 2 0 0 0  Altered sleeping  0 0  0  Tired, decreased energy  3 0  0  Change in appetite  0 0  0  Feeling bad or failure about yourself   0 0  0  Trouble concentrating  3 0  0  Moving slowly or fidgety/restless  3 0  0  Suicidal thoughts  1 0  0  PHQ-9 Score  12 0  0  Difficult doing work/chores  Very difficult Not difficult at all         Review of Systems  Musculoskeletal:        Upper back pain, bilateral shoulder pain, right arm pain radiating  to fingertips, bilateral thigh pain, bilateral foot pain  All other systems reviewed and are negative.      Objective:   Physical Exam  Awake, alert, appropriate, accompanied ny wife, no Assistive device, NAD  MSK: RUE 5/5 except FA 4+/5 LUE- Deltoids, 4+/5; biceps, triceps 5-/5; WE 4+/5; Grip 4+/5 and FA 4+/5 LE's- RLE- 5/5 LLE- HF 4+/5; 5-/5 otherwise  Neuro: 3+ DTR's in RUE and LUE and 3+ very brisk L patella-  and 2-3+ R patella.       Assessment & Plan:   Pt is a 78 yr old male with hx of Incomplete quadriplegia with Central cord syndrome ASIA D- with C4 fx s/p C3/4/5 fusion- neurogenic bladder- foley, LUL PE on Eliquis ; Chronic pain on MS Contin ; Severe orthostatic hypotension, B/L  shoulder pain;    Patients get acclimated usually to BP- reduce Midodrine  to 5 mg 2x/day x 1 week, then reduce to 2.5 mg 2x/day- for 1 week, then make as needed.    2.   We discussed at length about going to [place]-  Will see him in late September to determine if possible.    3.    Will increase Tramadol  to 100 mg BID prn. Can have MS Contin  from PCP-   4.  Let's make Seroquel  as needed- and if taking 4 nights/week, then will make scheduled.   5.  We discussed steroids- since getting better, doesn't want side effects of steroids.    6. Can be cleared for PT and OT- based on chart.    7.  Wife has new breast CA dx.  I'm so sorry.   8. F/U- in 6 weeks single appt-  and 3 months double appt- SCI   I spent a total of  46  minutes on total care today- >50% coordination of care- due to d/w pt about his medical issues- weaning meds; increasing pain meds

## 2024-02-22 ENCOUNTER — Ambulatory Visit: Admitting: Occupational Therapy

## 2024-02-22 ENCOUNTER — Ambulatory Visit: Admitting: Physical Therapy

## 2024-02-22 VITALS — BP 95/63 | HR 79

## 2024-02-22 DIAGNOSIS — R2689 Other abnormalities of gait and mobility: Secondary | ICD-10-CM

## 2024-02-22 DIAGNOSIS — R29818 Other symptoms and signs involving the nervous system: Secondary | ICD-10-CM

## 2024-02-22 DIAGNOSIS — R293 Abnormal posture: Secondary | ICD-10-CM | POA: Diagnosis not present

## 2024-02-22 DIAGNOSIS — M542 Cervicalgia: Secondary | ICD-10-CM | POA: Diagnosis not present

## 2024-02-22 DIAGNOSIS — R208 Other disturbances of skin sensation: Secondary | ICD-10-CM

## 2024-02-22 DIAGNOSIS — R278 Other lack of coordination: Secondary | ICD-10-CM

## 2024-02-22 DIAGNOSIS — M6281 Muscle weakness (generalized): Secondary | ICD-10-CM

## 2024-02-22 DIAGNOSIS — R2681 Unsteadiness on feet: Secondary | ICD-10-CM

## 2024-02-22 DIAGNOSIS — G8929 Other chronic pain: Secondary | ICD-10-CM | POA: Diagnosis not present

## 2024-02-22 DIAGNOSIS — M25512 Pain in left shoulder: Secondary | ICD-10-CM | POA: Diagnosis not present

## 2024-02-22 NOTE — Therapy (Addendum)
 OUTPATIENT PHYSICAL THERAPY NEURO TREATMENT   Patient Name: Brandon Watts MRN: [id number] DOB:1946/04/29, 78 y.o., male Today's Date: 02/22/2024   PCP: Geofm Glade PARAS, MD REFERRING PROVIDER: Pegge Toribio PARAS, PA-C   END OF SESSION:  PT End of Session - 02/22/24 1233     Visit Number 23    Number of Visits 27   Recert   Date for PT Re-Evaluation 03/13/24   recert   Authorization Type [employer]    Authorization Time Period 02/16/24 - 03/29/24 3 PT visits    PT Start Time 1232    PT Stop Time 1318    PT Time Calculation (min) 46 min    Equipment Utilized During Treatment Gait belt    Activity Tolerance Patient tolerated treatment well    Behavior During Therapy WFL for tasks assessed/performed                  Past Medical History:  Diagnosis Date   Allergic rhinitis    Arthritis    PAIN AND OA RIGHT HIP   Arthritis    BPH (benign prostatic hyperplasia)    DDD (degenerative disc disease), lumbar    MILD-FOUND ON XRAY   Glaucoma    normotensive type with early changes   Glaucoma    Hepatitis    HEPATITIS C -CLEARED   Hepatitis    Hep C, treated and cleared   HLD (hyperlipidemia)    taking statin as preventative   Hypertension    Insomnia    CHRONIC   Insomnia    Positive PPD, treated    Pulmonary nodules    FOLLOWED BY DR. LAMAR BROCKS GATES WITH CT EXAMS-MOST RECENT 01/22/12   Status post trigger finger release    right   Past Surgical History:  Procedure Laterality Date   ANTERIOR CERVICAL DECOMP/DISCECTOMY FUSION N/A 09/09/2023   Procedure: ANTERIOR CERVICAL DECOMPRESSION/DISCECTOMY FUSION 2 LEVEL/HARDWARE REMOVAL - C 3-4 , C4-5;  Surgeon: Debby Dorn MATSU, MD;  Location: [facility];  Service: Neurosurgery;  Laterality: N/A;   EYE SURGERY     LASIK EYE SURGERY   LITHOTRIPSY     x 3   LITHOTRIPSY     x2   TONSILLECTOMY     AS A CHILD   TONSILLECTOMY     TOTAL HIP ARTHROPLASTY Right 10/25/2012   Procedure: RIGHT TOTAL HIP ARTHROPLASTY ANTERIOR  APPROACH;  Surgeon: Donnice JONETTA Car, MD;  Location: [facility];  Service: Orthopedics;  Laterality: Right;   TOTAL HIP ARTHROPLASTY Right    TRIGGER FINGER RELEASE Right 2016   third digit right hand. Dr.Supple   URETERAL STONE EXTRACTION S X 2     VASCECTOMY     Patient Active Problem List   Diagnosis Date Noted   Spasticity 02/21/2024   Neurogenic bladder 02/21/2024   Hypotension 10/22/2023   Gross hematuria 10/22/2023   Constipation 10/22/2023   Quadriplegia, C1-C4, incomplete (HCC) 09/18/2023   C4 cervical fracture (HCC) 09/17/2023   Central cord syndrome (HCC) 09/17/2023   Dysphagia 09/17/2023   Respiratory failure with hypoxia (HCC) 09/17/2023   SDH (subdural hematoma) (HCC) 09/17/2023   Malnutrition of moderate degree 09/15/2023   Acute pulmonary embolism without acute cor pulmonale (HCC) 09/15/2023   Post-nasal drainage 09/15/2023   Pneumonia of left lower lobe due to infectious organism 09/15/2023   Hyperlipidemia 09/08/2023   Diastolic dysfunction without heart failure 09/17/2022   Episodic lightheadedness 08/26/2022   Vasovagal near syncope 08/26/2022   Hypercalcemia 08/25/2022   Hyperglycemia  08/25/2022   Acute left-sided low back pain with left-sided sciatica 10/13/2021   Left renal mass 10/12/2021   Performance anxiety 08/17/2021   Glaucoma 08/14/2021   Insomnia 08/14/2021   Hyperlipoproteinemia 08/14/2021   BPH (benign prostatic hyperplasia) 08/14/2021   Erectile dysfunction 08/14/2021   Trigger finger, acquired 07/11/2013   S/P right THA, AA 10/25/2012   BACK PAIN, LUMBAR 01/22/2010   NONSPEC REACT TUBERCULIN SKN TEST W/O ACTV TB 06/13/2009   HEPATITIS C 02/12/2007   ALLERGIC RHINITIS 02/12/2007   History of colonic polyps 02/12/2007    ONSET DATE: 10/08/2023 (referral)   REFERRING DIAG: G82.52 (ICD-10-CM) - Quadriplegia, C1-C4 incomplete S12.300A (ICD-10-CM) - Unspecified displaced fracture of fourth cervical vertebra, initial encounter for closed fracture  S14.129A (ICD-10-CM) - Central cord syndrome at unspecified level of cervical spinal cord, initial encounter  THERAPY DIAG:  Unsteadiness on feet  Other abnormalities of gait and mobility  Cervicalgia  Muscle weakness (generalized)  Rationale for Evaluation and Treatment: Rehabilitation  SUBJECTIVE:                                                                                                                                                                                             SUBJECTIVE STATEMENT: Saw Dr. Lovorn this week and reports things went well. Is on Tramadol  instead of Morphine  in the AM, so this morning was a bit rough pain wise but is feeling better today. Big goal is to go to [place] in which he will need to hike for 7-8 miles daily, so needs to work on global strength. Has a check in with Dr. Lovorn in September for MMT check to assess safety going on trip. No falls. Feels as though his paresthesias are getting better.   Pt accompanied by: Self   PERTINENT HISTORY: HTN, HLD, BPH, Glaucoma, Hx of hep C s/p treatment  PAIN:  Are you having pain? Yes: NPRS scale: 3-4/10  Pain location: Bilateral shoulder girdles L > R Pain description: Achy  Aggravating factors: Moving arm Relieving factors: Pain meds  PRECAUTIONS: Cervical, Fall, and Other: No lifting over 5#, foley catheter  RED FLAGS: None   WEIGHT BEARING RESTRICTIONS: No  FALLS: Has patient fallen in last 6 months? No  LIVING ENVIRONMENT: Lives with: lives with their spouse Lives in: House/apartment Stairs: Yes: Internal: 16 steps; on right going up, on left going up, and can reach both and External: 3 + 1 steps; on right going up, on left going up, and can reach both Has following equipment at home: Walker - 4 wheeled, Shower bench, and bed side commode  PLOF: Independent  PATIENT GOALS: To  be able to hike in the [place] in August and in [place] in 07/2024. I would like to be able to play  the cello again and walk my dog along the trails in about a month   OBJECTIVE:  Note: Objective measures were completed at Evaluation unless otherwise noted.  DIAGNOSTIC FINDINGS: MRI of C-spine from 09/08/2023  IMPRESSION: 1. Signal abnormality within the ventral spinal cord at the C3-C4 level likely reflecting cord contusion. 2. Edema at site of a known acute fracture within the C4 spinous process. 3. Ligamentum flavum irregularity at C4-C5 consistent with ligamentum flavum injury, and possible ligamentum flavum disruption. 4. Edema signal within the interspinous spaces at C3-C4, C4-C5, C5-C6, C6-C7 and C7-T1 consistent with interspinous ligament injury. 5. Thinned appearance of the anterior longitudinal ligament at the C3 level suspicious for anterior longitudinal ligament injury (without complete ligamentous disruption). 6. Prevertebral edema/hematoma spanning the C2-C6 levels. 7. Cervical spondylosis as outlined within the body of the report. Moderate spinal canal stenosis at C3-C4 and C4-C5. Multilevel foraminal stenosis, greatest bilaterally at C3-C4 (moderate/severe) and on the left at C4-C5 (moderate). 8. Vertebral body and left-sided facet ankylosis at C2-C3.  COGNITION: Overall cognitive status: Within functional limits for tasks assessed   SENSATION: Stocking numbness in BLEs   POSTURE: rounded shoulders and pt wearing cervical collar  LOWER EXTREMITY ROM:     Active  Right Eval Left Eval  Hip flexion    Hip extension    Hip abduction    Hip adduction    Hip internal rotation    Hip external rotation    Knee flexion    Knee extension    Ankle dorsiflexion    Ankle plantarflexion    Ankle inversion    Ankle eversion     (Blank rows = not tested)  LOWER EXTREMITY MMT:  Tested in seated position   MMT Right Eval Left Eval  Hip flexion 4- 4-  Hip extension    Hip abduction 5 5  Hip adduction 5 5  Hip internal rotation    Hip external rotation     Knee flexion 5 5  Knee extension 5 5  Ankle dorsiflexion 5 5  Ankle plantarflexion    Ankle inversion    Ankle eversion    (Blank rows = not tested)  BED MOBILITY:  Pt reports he needs more support to get out of his bed at home due to having to get out on the L side (was getting out on the R side in the hospital) and has difficulty propping up on L shoulder due to pain  TRANSFERS: Assistive device utilized: Environmental consultant - 4 wheeled  Sit to stand: SBA Stand to sit: SBA Chair to chair: SBA Noted decreased anterior weight shift initially w/minor lateral shift to L side. Poor eccentric control w/fatigue   STAIRS: Level of Assistance: SBA Stair Negotiation Technique: Alternating Pattern  with Bilateral Rails Number of Stairs: 4  Height of Stairs: 6  Comments: Decreased eccentric control on RLE   GAIT: Gait pattern: step through pattern, lateral hip instability, trunk flexed, and wide BOS Distance walked: Various clinic distances  Assistive device utilized: None Level of assistance: Modified independence Comments: Pt is mod I w/rollator and SBA w/no AD due to anterior instability and decreased visual scanning ability   VITALS:  Vitals:   02/22/24 1238 02/22/24 1241  BP: 136/80 Comment: Sitting 95/63  Pulse: 70 79       CERVICAL ROM: tested in seated position  Active ROM A/ROM (  deg) 12/02/23 A/ROM (deg)  12/28/23 A/ROM (deg) 01/17/24 A/ROM (deg) 02/22/24  Flexion 24 30 41 43  Extension 15 30 29 28   Right lateral flexion 13 28 10 12   Left lateral flexion 8 19 10 14   Right rotation 11 23 21  47  Left rotation 21 30 27  49   (Blank rows = not tested)                                                                                                TREATMENT :  Self-care/home management/Ther Act  Assessed vitals in seated and standing position (see above) and pt continues to be orthostatic but asymptomatic today. Pt reports he checked his BP at home this morning and was not  orthostatic.  Assessed cervical A/ROM (see above) for STG assessment and pt did improve cervical rotation A/ROM bilaterally to goal level.   NMR  Practiced proper floor transfers from standing position to floor mat. Pt able to descend to floor mat mod I, but had significant instability returning to stand from half kneel position due to weakness in L hip. Pt reported he felt as though he was going to fall and provided CGA for safety. Recommended pt use a sturdy object, such as a table to couch, to use for RUE support when coming to stand to help stabilize. Pt verbalized understanding.  On floor mat, practiced alt tall to half kneel, x2 reps per side, as pt was in this position when he had most recent fall. Noted decreased step clearance bilaterally but no posterior instability noted. Progressed to alt tall to half kneel over 2# dowel, x8 reps per side, for improved step clearance, functional hip strength, lateral weight shifting and single leg stability. Pt more challenged w/this but performed well. Encouraged pt to practice this at home either on floor mat or on bed. Pt concerned with ability to get into this position on bed due to it's height, so informed pt he may ONLY TRY IF KATHY IS PRESENT and standing behind pt. Pt verbalized understanding.  In // bars, practiced squats on rockerboard in L/R direction, x15 reps, for improved weightbearing tolerance on LLE and proper squat form for improved glute and quad strength. Pt required max multimodal cues for proper squat technique as he tends to push hips forward rather than posteriorly, putting excess strain on knees. With proper technique, pt reported feeling fatigue in L hip.    PATIENT EDUCATION: Education details: Goal results, proper squat technique, practicing tall to half knee safely at home  Person educated: Patient Education method: Explanation, Demonstration, and Verbal cues Education comprehension: verbalized understanding, returned  demonstration, verbal cues required, and needs further education  HOME EXERCISE PROGRAM: From [employer]; [id number]  Desensitization Techniques: -Light touch/pressure:  using fingertip pressure to slowly move up small segments of the affected body part until outside the area of pain and then back to where you started -Deep pressure:  using fingertips to squeeze in small segments starting outside the affected area, crossing over the affected area, and then to the other side of the affected area -Tapping:  fingertips tap  with firm pressure over the affected area, can move around the affected area as well -Brushing/scratching:  use fingertips to brush/scratch over and around the affected area -Textures:  use soft and rough textured items like cotton balls vs wash cloths to brush or rub with light into firm pressure over and around the affected area  Repeat these 3-4x per day.   Verbally added 11/15/23: Supine serratus punch 3 x 10 reps Supine L biceps and ant delt stretch off EOB 3 x 30 sec Access Code: [email or web address] URL: [email or web address] Date: 11/22/2023 Prepared by: Marlon Deetta Siegmann  Exercises - Seated Hamstring Stretch  - 1 x daily - 7 x weekly - 1-2 reps - 60-90 seconds hold - Soleus Stretch on Wall  - 1 x daily - 7 x weekly - 60-90 seconds hold - Staggered Stance Squat  - 1 x daily - 7 x weekly - 3 sets - 10 reps - Supine Cervical Retraction with Towel  - 1 x daily - 7 x weekly - 3 sets - 10 reps - Supine Cervical Circles on Pillow  - 1 x daily - 7 x weekly - 3 sets - 10 reps - Bent Over Single Arm Shoulder Row with Dumbbell  - 1 x daily - 7 x weekly - 3 sets - 10 reps  Verbally added on 12/09/23:  - Fwd/retro/lat monster walks w/green resistance band  - Prone or modified plantigrade cervical extension w/2-3s hold   Verbally added on 12/13/23: - mod plantigrade bird dogs  -serratus wall pushes   Verbally added on 02/22/24:  Tall to half kneel on floor or bed Bobbetta  must be present)  GOALS: Goals reviewed with patient? Yes   NEW SHORT TERM GOALS:   Target date: 02/14/2024 -   Pt will improve both left and right cervical rotation to > 40 degrees for improved functional use of cervical spine and return to driving  Baseline:  Active ROM A/ROM (deg) 12/02/23 A/ROM (deg)  12/28/23 A/ROM (deg) 01/17/24 A/ROM (deg) 02/22/24  Flexion 24 30 41 43  Extension 15 30 29 28   Right lateral flexion 13 28 10 12   Left lateral flexion 8 19 10 14   Right rotation 11 23 21  47  Left rotation 21 30 27  49   (Blank rows = not tested)  Goal status: MET  2.  Pt will improve MiniBest to 24/28 for decreased fall risk and improvement with compensatory stepping strategies.  Baseline: 22/28 Goal status: IN PROGRESS   NEW LONG TERM GOALS:  Target date: 03/13/2024  Pt will improve left and right cervical lateral flexion to >/= 25 degrees for improved functional use of cervical spine and relaxation of upper traps  Baseline:  Active ROM A/ROM (deg) 12/02/23 A/ROM (deg)  12/28/23 A/ROM (deg) 01/17/24  Flexion 24 30 41  Extension 15 30 29   Right lateral flexion 13 28 10   Left lateral flexion 8 19 10   Right rotation 11 23 21   Left rotation 21 30 27    (Blank rows = not tested)  Goal status: INITIAL  2.  Pt will hold single leg stance on BLEs without shoes on for >/= 10s for improved ankle stability and return to hiking.  Baseline:  Goal status: INITIAL     ASSESSMENT:  CLINICAL IMPRESSION: Emphasis of skilled PT session on STG assessment, safety w/floor transfers and functional hip strength. Pt has met 1 of 2 STGs this date, improving his cervical rotation A/ROM to >40 degrees bilaterally. Practiced getting onto/off of floor today as  this is where his fall occurred and noted significant weakness in L hip, causing pt to lose balance anteriorly and requiring CGA for safety. Recommended pt perform w/RUE support on solid piece of furniture at home in future for reduced fall risk.  Added alt tall to half kneel to HEP to work on single leg stability and functional hip strength as pt has goal of going to [place] next year with wife and will need to hike 7-8 miles daily. Continue POC.     OBJECTIVE IMPAIRMENTS: Abnormal gait, decreased activity tolerance, decreased balance, decreased coordination, decreased endurance, decreased mobility, decreased strength, dizziness, impaired sensation, impaired UE functional use, improper body mechanics, and pain  ACTIVITY LIMITATIONS: carrying, lifting, bending, squatting, transfers, bed mobility, toileting, dressing, reach over head, hygiene/grooming, locomotion level, and caring for others  PARTICIPATION LIMITATIONS: meal prep, cleaning, laundry, medication management, driving, shopping, community activity, occupation, and yard work  PERSONAL FACTORS: Fitness, Past/current experiences, and 1 comorbidity: quadriplegia are also affecting patient's functional outcome.   REHAB POTENTIAL: Excellent  CLINICAL DECISION MAKING: Evolving/moderate complexity  EVALUATION COMPLEXITY: Moderate  PLAN:  PT FREQUENCY: 1-2x/week (recert)   PT DURATION: 6 weeks + 6 weeks + 8 weeks (recert)  PLANNED INTERVENTIONS: 02835- PT Re-evaluation, 97110-Therapeutic exercises, 97530- Therapeutic activity, 97112- Neuromuscular re-education, 97535- Self Care, 02859- Manual therapy, [phone]- Gait training, [phone]- Orthotic Fit/training, [phone]- Aquatic Therapy, [phone]- Electrical stimulation (manual), Patient/Family education, Balance training, Stair training, Dry Needling, Joint mobilization, Spinal mobilization, Vestibular training, and DME instructions  PLAN FOR NEXT SESSION: Monitor vitals- pt very hypotensive. Work on return to hiking; NBOS, eyes closed, eccentric control, endurance, response to DN? L shoulder periscapular strength, does he want to schedule again with TT for DN of his L biceps and ant delt? Posterior stability (pt loses balance w/toe raises),  cervical ROM. Revise HEP. Anterior weight shift, ankle proprioception,   [place] Zou E Teona Vargus, PT, DPT  02/22/2024, 1:39 PM
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
## 2024-02-25 NOTE — Telephone Encounter (Signed)
 Taken care of

## 2024-02-29 ENCOUNTER — Ambulatory Visit: Admitting: Physical Therapy

## 2024-02-29 ENCOUNTER — Ambulatory Visit: Admitting: Occupational Therapy

## 2024-02-29 VITALS — BP 98/63 | HR 69

## 2024-02-29 DIAGNOSIS — R278 Other lack of coordination: Secondary | ICD-10-CM

## 2024-02-29 DIAGNOSIS — R29818 Other symptoms and signs involving the nervous system: Secondary | ICD-10-CM

## 2024-02-29 DIAGNOSIS — M542 Cervicalgia: Secondary | ICD-10-CM | POA: Diagnosis not present

## 2024-02-29 DIAGNOSIS — G8929 Other chronic pain: Secondary | ICD-10-CM

## 2024-02-29 DIAGNOSIS — M6281 Muscle weakness (generalized): Secondary | ICD-10-CM

## 2024-02-29 DIAGNOSIS — R2681 Unsteadiness on feet: Secondary | ICD-10-CM | POA: Diagnosis not present

## 2024-02-29 DIAGNOSIS — R2689 Other abnormalities of gait and mobility: Secondary | ICD-10-CM | POA: Diagnosis not present

## 2024-02-29 DIAGNOSIS — R208 Other disturbances of skin sensation: Secondary | ICD-10-CM | POA: Diagnosis not present

## 2024-02-29 DIAGNOSIS — R293 Abnormal posture: Secondary | ICD-10-CM | POA: Diagnosis not present

## 2024-02-29 DIAGNOSIS — M25512 Pain in left shoulder: Secondary | ICD-10-CM | POA: Diagnosis not present
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
## 2024-02-29 NOTE — Therapy (Signed)
 OUTPATIENT PHYSICAL THERAPY NEURO TREATMENT   Patient Name: Brandon Watts MRN: [id number] DOB:13-Jul-1946, 78 y.o., male Today's Date: 02/29/2024   PCP: Geofm Glade PARAS, MD REFERRING PROVIDER: Pegge Toribio PARAS, PA-C   END OF SESSION:  PT End of Session - 02/29/24 1233     Visit Number 24    Number of Visits 27   Recert   Date for PT Re-Evaluation 03/13/24   recert   Authorization Type [employer]    Authorization Time Period 02/16/24 - 03/29/24 3 PT visits    PT Start Time 1231    PT Stop Time 1317    PT Time Calculation (min) 46 min    Equipment Utilized During Treatment Gait belt    Activity Tolerance Patient tolerated treatment well    Behavior During Therapy WFL for tasks assessed/performed                   Past Medical History:  Diagnosis Date   Allergic rhinitis    Arthritis    PAIN AND OA RIGHT HIP   Arthritis    BPH (benign prostatic hyperplasia)    DDD (degenerative disc disease), lumbar    MILD-FOUND ON XRAY   Glaucoma    normotensive type with early changes   Glaucoma    Hepatitis    HEPATITIS C -CLEARED   Hepatitis    Hep C, treated and cleared   HLD (hyperlipidemia)    taking statin as preventative   Hypertension    Insomnia    CHRONIC   Insomnia    Positive PPD, treated    Pulmonary nodules    FOLLOWED BY DR. LAMAR BROCKS GATES WITH CT EXAMS-MOST RECENT 01/22/12   Status post trigger finger release    right   Past Surgical History:  Procedure Laterality Date   ANTERIOR CERVICAL DECOMP/DISCECTOMY FUSION N/A 09/09/2023   Procedure: ANTERIOR CERVICAL DECOMPRESSION/DISCECTOMY FUSION 2 LEVEL/HARDWARE REMOVAL - C 3-4 , C4-5;  Surgeon: Debby Dorn MATSU, MD;  Location: [facility];  Service: Neurosurgery;  Laterality: N/A;   EYE SURGERY     LASIK EYE SURGERY   LITHOTRIPSY     x 3   LITHOTRIPSY     x2   TONSILLECTOMY     AS A CHILD   TONSILLECTOMY     TOTAL HIP ARTHROPLASTY Right 10/25/2012   Procedure: RIGHT TOTAL HIP ARTHROPLASTY  ANTERIOR APPROACH;  Surgeon: Donnice JONETTA Car, MD;  Location: [facility];  Service: Orthopedics;  Laterality: Right;   TOTAL HIP ARTHROPLASTY Right    TRIGGER FINGER RELEASE Right 2016   third digit right hand. Dr.Supple   URETERAL STONE EXTRACTION S X 2     VASCECTOMY     Patient Active Problem List   Diagnosis Date Noted   Spasticity 02/21/2024   Neurogenic bladder 02/21/2024   Hypotension 10/22/2023   Gross hematuria 10/22/2023   Constipation 10/22/2023   Quadriplegia, C1-C4, incomplete (HCC) 09/18/2023   C4 cervical fracture (HCC) 09/17/2023   Central cord syndrome (HCC) 09/17/2023   Dysphagia 09/17/2023   Respiratory failure with hypoxia (HCC) 09/17/2023   SDH (subdural hematoma) (HCC) 09/17/2023   Malnutrition of moderate degree 09/15/2023   Acute pulmonary embolism without acute cor pulmonale (HCC) 09/15/2023   Post-nasal drainage 09/15/2023   Pneumonia of left lower lobe due to infectious organism 09/15/2023   Hyperlipidemia 09/08/2023   Diastolic dysfunction without heart failure 09/17/2022   Episodic lightheadedness 08/26/2022   Vasovagal near syncope 08/26/2022   Hypercalcemia 08/25/2022  Hyperglycemia 08/25/2022   Acute left-sided low back pain with left-sided sciatica 10/13/2021   Left renal mass 10/12/2021   Performance anxiety 08/17/2021   Glaucoma 08/14/2021   Insomnia 08/14/2021   Hyperlipoproteinemia 08/14/2021   BPH (benign prostatic hyperplasia) 08/14/2021   Erectile dysfunction 08/14/2021   Trigger finger, acquired 07/11/2013   S/P right THA, AA 10/25/2012   BACK PAIN, LUMBAR 01/22/2010   NONSPEC REACT TUBERCULIN SKN TEST W/O ACTV TB 06/13/2009   HEPATITIS C 02/12/2007   ALLERGIC RHINITIS 02/12/2007   History of colonic polyps 02/12/2007    ONSET DATE: 10/08/2023 (referral)   REFERRING DIAG: G82.52 (ICD-10-CM) - Quadriplegia, C1-C4 incomplete S12.300A (ICD-10-CM) - Unspecified displaced fracture of fourth cervical vertebra, initial encounter for closed  fracture S14.129A (ICD-10-CM) - Central cord syndrome at unspecified level of cervical spinal cord, initial encounter  THERAPY DIAG:  Other lack of coordination  Muscle weakness (generalized)  Chronic left shoulder pain  Unsteadiness on feet  Cervicalgia  Rationale for Evaluation and Treatment: Rehabilitation  SUBJECTIVE:                                                                                                                                                                                             SUBJECTIVE STATEMENT: Pt presents alone. States he feels about the same as he did on July 27th. Has been working on his HEP, added 5# ankle weights to his legs to make things more fun. Tried tall to half kneel on the bed but was too difficult for him so he is doing them on the floor.   Did a voiding trial yesterday and was unsuccessful and caused several spasms that were painful.   Pt accompanied by: Self   PERTINENT HISTORY: HTN, HLD, BPH, Glaucoma, Hx of hep C s/p treatment  PAIN:  Are you having pain? Yes: NPRS scale: 2-3/10  Pain location: Bilateral shoulder girdles L > R Pain description: Achy  Aggravating factors: Moving arm Relieving factors: Pain meds  PRECAUTIONS: Cervical, Fall, and Other: No lifting over 5#, foley catheter  RED FLAGS: None   WEIGHT BEARING RESTRICTIONS: No  FALLS: Has patient fallen in last 6 months? No  LIVING ENVIRONMENT: Lives with: lives with their spouse Lives in: House/apartment Stairs: Yes: Internal: 16 steps; on right going up, on left going up, and can reach both and External: 3 + 1 steps; on right going up, on left going up, and can reach both Has following equipment at home: Walker - 4 wheeled, Shower bench, and bed side commode  PLOF: Independent  PATIENT GOALS: To be able to hike in the [place] in August and  in [place] in 07/2024. I would like to be able to play the cello again and walk my dog along the trails in  about a month   OBJECTIVE:  Note: Objective measures were completed at Evaluation unless otherwise noted.  DIAGNOSTIC FINDINGS: MRI of C-spine from 09/08/2023  IMPRESSION: 1. Signal abnormality within the ventral spinal cord at the C3-C4 level likely reflecting cord contusion. 2. Edema at site of a known acute fracture within the C4 spinous process. 3. Ligamentum flavum irregularity at C4-C5 consistent with ligamentum flavum injury, and possible ligamentum flavum disruption. 4. Edema signal within the interspinous spaces at C3-C4, C4-C5, C5-C6, C6-C7 and C7-T1 consistent with interspinous ligament injury. 5. Thinned appearance of the anterior longitudinal ligament at the C3 level suspicious for anterior longitudinal ligament injury (without complete ligamentous disruption). 6. Prevertebral edema/hematoma spanning the C2-C6 levels. 7. Cervical spondylosis as outlined within the body of the report. Moderate spinal canal stenosis at C3-C4 and C4-C5. Multilevel foraminal stenosis, greatest bilaterally at C3-C4 (moderate/severe) and on the left at C4-C5 (moderate). 8. Vertebral body and left-sided facet ankylosis at C2-C3.  COGNITION: Overall cognitive status: Within functional limits for tasks assessed   SENSATION: Stocking numbness in BLEs   POSTURE: rounded shoulders and pt wearing cervical collar  LOWER EXTREMITY ROM:     Active  Right Eval Left Eval  Hip flexion    Hip extension    Hip abduction    Hip adduction    Hip internal rotation    Hip external rotation    Knee flexion    Knee extension    Ankle dorsiflexion    Ankle plantarflexion    Ankle inversion    Ankle eversion     (Blank rows = not tested)  LOWER EXTREMITY MMT:  Tested in seated position   MMT Right Eval Left Eval  Hip flexion 4- 4-  Hip extension    Hip abduction 5 5  Hip adduction 5 5  Hip internal rotation    Hip external rotation    Knee flexion 5 5  Knee extension 5 5  Ankle  dorsiflexion 5 5  Ankle plantarflexion    Ankle inversion    Ankle eversion    (Blank rows = not tested)  BED MOBILITY:  Pt reports he needs more support to get out of his bed at home due to having to get out on the L side (was getting out on the R side in the hospital) and has difficulty propping up on L shoulder due to pain  TRANSFERS: Assistive device utilized: Environmental consultant - 4 wheeled  Sit to stand: SBA Stand to sit: SBA Chair to chair: SBA Noted decreased anterior weight shift initially w/minor lateral shift to L side. Poor eccentric control w/fatigue   STAIRS: Level of Assistance: SBA Stair Negotiation Technique: Alternating Pattern  with Bilateral Rails Number of Stairs: 4  Height of Stairs: 6  Comments: Decreased eccentric control on RLE   GAIT: Gait pattern: step through pattern, lateral hip instability, trunk flexed, and wide BOS Distance walked: Various clinic distances  Assistive device utilized: None Level of assistance: Modified independence Comments: Pt is mod I w/rollator and SBA w/no AD due to anterior instability and decreased visual scanning ability   VITALS:  Vitals:   02/29/24 1237 02/29/24 1241  BP: 122/76 98/63  Pulse: 64 69        CERVICAL ROM: tested in seated position  Active ROM A/ROM (deg) 12/02/23 A/ROM (deg)  12/28/23 A/ROM (deg) 01/17/24 A/ROM (deg)  02/22/24  Flexion 24 30 41 43  Extension 15 30 29 28   Right lateral flexion 13 28 10 12   Left lateral flexion 8 19 10 14   Right rotation 11 23 21  47  Left rotation 21 30 27  49   (Blank rows = not tested)                                                                                                TREATMENT :  Self-care/home management/Ther Act  Assessed vitals in seated and standing position (see above) and pt continues to be orthostatic but was asymptomatic today.    [facility] PT Assessment - 02/29/24 1242       Mini-BESTest   Sit To Stand Normal: Comes to stand without use of hands and  stabilizes independently.    Rise to Toes Moderate: Heels up, but not full range (smaller than when holding hands), OR noticeable instability for 3 s.   Bilateral inversion   Stand on one leg (left) Moderate: < 20 s   4.5s   Stand on one leg (right) Moderate: < 20 s   6s   Stand on one leg - lowest score 1    Compensatory Stepping Correction - Forward Normal: Recovers independently with a single, large step (second realignement is allowed).    Compensatory Stepping Correction - Backward Normal: Recovers independently with a single, large step    Compensatory Stepping Correction - Left Lateral Normal: Recovers independently with 1 step (crossover or lateral OK)   Crossover step   Compensatory Stepping Correction - Right Lateral Normal: Recovers independently with 1 step (crossover or lateral OK)    Stepping Corredtion Lateral - lowest score 2    Stance - Feet together, eyes open, firm surface  Normal: 30s    Stance - Feet together, eyes closed, foam surface  Normal: 30s   mild A/P sway   Incline - Eyes Closed Normal: Stands independently 30s and aligns with gravity   Posterior instability corrected by hip strategy   Change in Gait Speed Normal: Significantly changes walkling speed without imbalance    Walk with head turns - Horizontal Normal: performs head turns with no change in gait speed and good balance    Walk with pivot turns Normal: Turns with feet close FAST (< 3 steps) with good balance.   Posterior LOB   Step over obstacles Normal: Able to step over box with minimal change of gait speed and with good balance.    Timed UP & GO with Dual Task Normal: No noticeable change in sitting, standing or walking while backward counting when compared to TUG without    Mini-BEST total score 26      Timed Up and Go Test   Normal TUG (seconds) 7.18    Cognitive TUG (seconds) 8.79   retro counting by 7         Discussed results of MiniBest and pt continues to be most unstable in posterior  direction. Pt reports he is no longer wearing his boots all day and has been going barefoot at home to work on intrinsic foot  strength. Recommended pt wear sandals inside at home (not on stairs) as well to work on foot stability.  Discussed tendency for pt to invert his ankle when on unlevel surfaces and cannot stabilize on his hallucis. Will continue to work on stabilization of his hallucis and posterior tibialis stability to prepare for his hiking trip in January    PATIENT EDUCATION: Education details: Goal results, working on intrinsic foot strength at home  Person educated: Patient Education method: Programmer, multimedia, Facilities manager, and Verbal cues Education comprehension: verbalized understanding, returned demonstration, verbal cues required, and needs further education  HOME EXERCISE PROGRAM: From [employer]; [id number]  Desensitization Techniques: -Light touch/pressure:  using fingertip pressure to slowly move up small segments of the affected body part until outside the area of pain and then back to where you started -Deep pressure:  using fingertips to squeeze in small segments starting outside the affected area, crossing over the affected area, and then to the other side of the affected area -Tapping:  fingertips tap with firm pressure over the affected area, can move around the affected area as well -Brushing/scratching:  use fingertips to brush/scratch over and around the affected area -Textures:  use soft and rough textured items like cotton balls vs wash cloths to brush or rub with light into firm pressure over and around the affected area  Repeat these 3-4x per day.   Verbally added 11/15/23: Supine serratus punch 3 x 10 reps Supine L biceps and ant delt stretch off EOB 3 x 30 sec Access Code: [email or web address] URL: [email or web address] Date: 11/22/2023 Prepared by: Marlon Yitzchak Kothari  Exercises - Seated Hamstring Stretch  - 1 x daily - 7 x weekly - 1-2 reps - 60-90 seconds  hold - Soleus Stretch on Wall  - 1 x daily - 7 x weekly - 60-90 seconds hold - Staggered Stance Squat  - 1 x daily - 7 x weekly - 3 sets - 10 reps - Supine Cervical Retraction with Towel  - 1 x daily - 7 x weekly - 3 sets - 10 reps - Supine Cervical Circles on Pillow  - 1 x daily - 7 x weekly - 3 sets - 10 reps - Bent Over Single Arm Shoulder Row with Dumbbell  - 1 x daily - 7 x weekly - 3 sets - 10 reps  Verbally added on 12/09/23:  - Fwd/retro/lat monster walks w/green resistance band  - Prone or modified plantigrade cervical extension w/2-3s hold   Verbally added on 12/13/23: - mod plantigrade bird dogs  -serratus wall pushes   Verbally added on 02/22/24:  Tall to half kneel on floor or bed Bobbetta must be present)  GOALS: Goals reviewed with patient? Yes   NEW SHORT TERM GOALS:   Target date: 02/14/2024 -   Pt will improve both left and right cervical rotation to > 40 degrees for improved functional use of cervical spine and return to driving  Baseline:  Active ROM A/ROM (deg) 12/02/23 A/ROM (deg)  12/28/23 A/ROM (deg) 01/17/24 A/ROM (deg) 02/22/24  Flexion 24 30 41 43  Extension 15 30 29 28   Right lateral flexion 13 28 10 12   Left lateral flexion 8 19 10 14   Right rotation 11 23 21  47  Left rotation 21 30 27  49   (Blank rows = not tested)  Goal status: MET  2.  Pt will improve MiniBest to 24/28 for decreased fall risk and improvement with compensatory stepping strategies.  Baseline: 22/28; 26/28 Goal status: MET   NEW LONG  TERM GOALS:  Target date: 03/13/2024  Pt will improve left and right cervical lateral flexion to >/= 25 degrees for improved functional use of cervical spine and relaxation of upper traps  Baseline:  Active ROM A/ROM (deg) 12/02/23 A/ROM (deg)  12/28/23 A/ROM (deg) 01/17/24  Flexion 24 30 41  Extension 15 30 29   Right lateral flexion 13 28 10   Left lateral flexion 8 19 10   Right rotation 11 23 21   Left rotation 21 30 27    (Blank rows = not tested)   Goal status: INITIAL  2.  Pt will hold single leg stance on BLEs without shoes on for >/= 10s for improved ankle stability and return to hiking.  Baseline:  Goal status: INITIAL     ASSESSMENT:  CLINICAL IMPRESSION: Emphasis of skilled PT session on balance assessment via MiniBest and pt education regarding intrinsic foot strength. Pt scored a 26/28 on MiniBest, with greatest difficulty on SLS. Pt demonstrates uncontrolled ankle inversion w/heel raises and continues to lose balance posteriorly on unlevel surfaces or with reduced visual input. Pt reports his goal continues to be going to [place] with his wife in January but will need to work on posterior stability, ankle strategy and posterior tibialis strength in order to safely be able to hike. Continue POC.     OBJECTIVE IMPAIRMENTS: Abnormal gait, decreased activity tolerance, decreased balance, decreased coordination, decreased endurance, decreased mobility, decreased strength, dizziness, impaired sensation, impaired UE functional use, improper body mechanics, and pain  ACTIVITY LIMITATIONS: carrying, lifting, bending, squatting, transfers, bed mobility, toileting, dressing, reach over head, hygiene/grooming, locomotion level, and caring for others  PARTICIPATION LIMITATIONS: meal prep, cleaning, laundry, medication management, driving, shopping, community activity, occupation, and yard work  PERSONAL FACTORS: Fitness, Past/current experiences, and 1 comorbidity: quadriplegia are also affecting patient's functional outcome.   REHAB POTENTIAL: Excellent  CLINICAL DECISION MAKING: Evolving/moderate complexity  EVALUATION COMPLEXITY: Moderate  PLAN:  PT FREQUENCY: 1-2x/week (recert)   PT DURATION: 6 weeks + 6 weeks + 8 weeks (recert)  PLANNED INTERVENTIONS: 02835- PT Re-evaluation, 97110-Therapeutic exercises, 97530- Therapeutic activity, 97112- Neuromuscular re-education, 97535- Self Care, 02859- Manual therapy, [phone]- Gait  training, [phone]- Orthotic Fit/training, [phone]- Aquatic Therapy, [phone]- Electrical stimulation (manual), Patient/Family education, Balance training, Stair training, Dry Needling, Joint mobilization, Spinal mobilization, Vestibular training, and DME instructions  PLAN FOR NEXT SESSION: Monitor vitals- pt very hypotensive. Work on return to hiking; NBOS, eyes closed, eccentric control, endurance, response to DN? L shoulder periscapular strength, does he want to schedule again with TT for DN of his L biceps and ant delt? Posterior stability (pt loses balance w/toe raises), cervical ROM. Revise HEP. Anterior weight shift, ankle proprioception, posterior tib work, stability on big toe    Shatha Hooser E Hazaiah [place], PT, DPT  02/29/2024, 2:22 PM

## 2024-03-03 DIAGNOSIS — M4712 Other spondylosis with myelopathy, cervical region: Secondary | ICD-10-CM | POA: Diagnosis not present

## 2024-03-07 ENCOUNTER — Ambulatory Visit: Admitting: Physical Therapy

## 2024-03-07 ENCOUNTER — Ambulatory Visit: Admitting: Occupational Therapy

## 2024-03-07 VITALS — BP 106/72 | HR 70

## 2024-03-07 DIAGNOSIS — R29818 Other symptoms and signs involving the nervous system: Secondary | ICD-10-CM

## 2024-03-07 DIAGNOSIS — M6281 Muscle weakness (generalized): Secondary | ICD-10-CM

## 2024-03-07 DIAGNOSIS — M25512 Pain in left shoulder: Secondary | ICD-10-CM

## 2024-03-07 DIAGNOSIS — R2689 Other abnormalities of gait and mobility: Secondary | ICD-10-CM | POA: Diagnosis not present

## 2024-03-07 DIAGNOSIS — R293 Abnormal posture: Secondary | ICD-10-CM | POA: Diagnosis not present

## 2024-03-07 DIAGNOSIS — R208 Other disturbances of skin sensation: Secondary | ICD-10-CM

## 2024-03-07 DIAGNOSIS — G8929 Other chronic pain: Secondary | ICD-10-CM | POA: Diagnosis not present

## 2024-03-07 DIAGNOSIS — R278 Other lack of coordination: Secondary | ICD-10-CM

## 2024-03-07 DIAGNOSIS — R2681 Unsteadiness on feet: Secondary | ICD-10-CM | POA: Diagnosis not present

## 2024-03-07 DIAGNOSIS — M542 Cervicalgia: Secondary | ICD-10-CM | POA: Diagnosis not present

## 2024-03-07 NOTE — Therapy (Signed)
 OUTPATIENT PHYSICAL THERAPY NEURO TREATMENT   Patient Name: Brandon Watts MRN: [id number] DOB:[birth year], 78 y.o., male Today's Date: 03/07/2024   PCP: Geofm Glade PARAS, MD REFERRING PROVIDER: Pegge Toribio PARAS, PA-C   END OF SESSION:  PT End of Session - 03/07/24 1233     Visit Number 25    Number of Visits 27   Recert   Date for PT Re-Evaluation 03/13/24   recert   Authorization Type [employer]    Authorization Time Period 02/16/24 - 03/29/24 3 PT visits    PT Start Time 1232    PT Stop Time 1315    PT Time Calculation (min) 43 min    Equipment Utilized During Treatment Gait belt    Activity Tolerance Patient tolerated treatment well    Behavior During Therapy WFL for tasks assessed/performed                    Past Medical History:  Diagnosis Date   Allergic rhinitis    Arthritis    PAIN AND OA RIGHT HIP   Arthritis    BPH (benign prostatic hyperplasia)    DDD (degenerative disc disease), lumbar    MILD-FOUND ON XRAY   Glaucoma    normotensive type with early changes   Glaucoma    Hepatitis    HEPATITIS C -CLEARED   Hepatitis    Hep C, treated and cleared   HLD (hyperlipidemia)    taking statin as preventative   Hypertension    Insomnia    CHRONIC   Insomnia    Positive PPD, treated    Pulmonary nodules    FOLLOWED BY DR. LAMAR BROCKS GATES WITH CT EXAMS-MOST RECENT 01/22/12   Status post trigger finger release    right   Past Surgical History:  Procedure Laterality Date   ANTERIOR CERVICAL DECOMP/DISCECTOMY FUSION N/A 09/09/2023   Procedure: ANTERIOR CERVICAL DECOMPRESSION/DISCECTOMY FUSION 2 LEVEL/HARDWARE REMOVAL - C 3-4 , C4-5;  Surgeon: Debby Dorn MATSU, MD;  Location: [facility];  Service: Neurosurgery;  Laterality: N/A;   EYE SURGERY     LASIK EYE SURGERY   LITHOTRIPSY     x 3   LITHOTRIPSY     x2   TONSILLECTOMY     AS A CHILD   TONSILLECTOMY     TOTAL HIP ARTHROPLASTY Right 10/25/2012   Procedure: RIGHT TOTAL HIP ARTHROPLASTY  ANTERIOR APPROACH;  Surgeon: Donnice JONETTA Car, MD;  Location: [facility];  Service: Orthopedics;  Laterality: Right;   TOTAL HIP ARTHROPLASTY Right    TRIGGER FINGER RELEASE Right 2016   third digit right hand. Dr.Supple   URETERAL STONE EXTRACTION S X 2     VASCECTOMY     Patient Active Problem List   Diagnosis Date Noted   Spasticity 02/21/2024   Neurogenic bladder 02/21/2024   Hypotension 10/22/2023   Gross hematuria 10/22/2023   Constipation 10/22/2023   Quadriplegia, C1-C4, incomplete (HCC) 09/18/2023   C4 cervical fracture (HCC) 09/17/2023   Central cord syndrome (HCC) 09/17/2023   Dysphagia 09/17/2023   Respiratory failure with hypoxia (HCC) 09/17/2023   SDH (subdural hematoma) (HCC) 09/17/2023   Malnutrition of moderate degree 09/15/2023   Acute pulmonary embolism without acute cor pulmonale (HCC) 09/15/2023   Post-nasal drainage 09/15/2023   Pneumonia of left lower lobe due to infectious organism 09/15/2023   Hyperlipidemia 09/08/2023   Diastolic dysfunction without heart failure 09/17/2022   Episodic lightheadedness 08/26/2022   Vasovagal near syncope 08/26/2022   Hypercalcemia 08/25/2022  Hyperglycemia 08/25/2022   Acute left-sided low back pain with left-sided sciatica 10/13/2021   Left renal mass 10/12/2021   Performance anxiety 08/17/2021   Glaucoma 08/14/2021   Insomnia 08/14/2021   Hyperlipoproteinemia 08/14/2021   BPH (benign prostatic hyperplasia) 08/14/2021   Erectile dysfunction 08/14/2021   Trigger finger, acquired 07/11/2013   S/P right THA, AA 10/25/2012   BACK PAIN, LUMBAR 01/22/2010   NONSPEC REACT TUBERCULIN SKN TEST W/O ACTV TB 06/13/2009   HEPATITIS C 02/12/2007   ALLERGIC RHINITIS 02/12/2007   History of colonic polyps 02/12/2007    ONSET DATE: 10/08/2023 (referral)   REFERRING DIAG: G82.52 (ICD-10-CM) - Quadriplegia, C1-C4 incomplete S12.300A (ICD-10-CM) - Unspecified displaced fracture of fourth cervical vertebra, initial encounter for closed  fracture S14.129A (ICD-10-CM) - Central cord syndrome at unspecified level of cervical spinal cord, initial encounter  THERAPY DIAG:  Chronic left shoulder pain  Muscle weakness (generalized)  Other lack of coordination  Other abnormalities of gait and mobility  Rationale for Evaluation and Treatment: Rehabilitation  SUBJECTIVE:                                                                                                                                                                                             SUBJECTIVE STATEMENT: Pt presents alone. States he had a good weekend, went on a few walks and did some lawn work. Pain levels are improving and he is relying on less medication.   Having more urinary leakage, especially at night. Sees his urologist next week.   Pt accompanied by: Self   PERTINENT HISTORY: HTN, HLD, BPH, Glaucoma, Hx of hep C s/p treatment  PAIN:  Are you having pain? Yes: NPRS scale: 2-3/10  Pain location: Bilateral shoulder girdles L > R Pain description: Achy  Aggravating factors: Moving arm Relieving factors: Pain meds  PRECAUTIONS: Cervical, Fall, and Other: No lifting over 5#, foley catheter  RED FLAGS: None   WEIGHT BEARING RESTRICTIONS: No  FALLS: Has patient fallen in last 6 months? No  LIVING ENVIRONMENT: Lives with: lives with their spouse Lives in: House/apartment Stairs: Yes: Internal: 16 steps; on right going up, on left going up, and can reach both and External: 3 + 1 steps; on right going up, on left going up, and can reach both Has following equipment at home: Vannie - 4 wheeled, Shower bench, and bed side commode  PLOF: Independent  PATIENT GOALS: To be able to hike in the [place] in August and in [place] in 07/2024. I would like to be able to play the cello again and walk my dog along the trails in about a month  OBJECTIVE:  Note: Objective measures were completed at Evaluation unless otherwise noted.  DIAGNOSTIC  FINDINGS: MRI of C-spine from 09/08/2023  IMPRESSION: 1. Signal abnormality within the ventral spinal cord at the C3-C4 level likely reflecting cord contusion. 2. Edema at site of a known acute fracture within the C4 spinous process. 3. Ligamentum flavum irregularity at C4-C5 consistent with ligamentum flavum injury, and possible ligamentum flavum disruption. 4. Edema signal within the interspinous spaces at C3-C4, C4-C5, C5-C6, C6-C7 and C7-T1 consistent with interspinous ligament injury. 5. Thinned appearance of the anterior longitudinal ligament at the C3 level suspicious for anterior longitudinal ligament injury (without complete ligamentous disruption). 6. Prevertebral edema/hematoma spanning the C2-C6 levels. 7. Cervical spondylosis as outlined within the body of the report. Moderate spinal canal stenosis at C3-C4 and C4-C5. Multilevel foraminal stenosis, greatest bilaterally at C3-C4 (moderate/severe) and on the left at C4-C5 (moderate). 8. Vertebral body and left-sided facet ankylosis at C2-C3.  COGNITION: Overall cognitive status: Within functional limits for tasks assessed   SENSATION: Stocking numbness in BLEs   POSTURE: rounded shoulders and pt wearing cervical collar  LOWER EXTREMITY ROM:     Active  Right Eval Left Eval  Hip flexion    Hip extension    Hip abduction    Hip adduction    Hip internal rotation    Hip external rotation    Knee flexion    Knee extension    Ankle dorsiflexion    Ankle plantarflexion    Ankle inversion    Ankle eversion     (Blank rows = not tested)  LOWER EXTREMITY MMT:  Tested in seated position   MMT Right Eval Left Eval  Hip flexion 4- 4-  Hip extension    Hip abduction 5 5  Hip adduction 5 5  Hip internal rotation    Hip external rotation    Knee flexion 5 5  Knee extension 5 5  Ankle dorsiflexion 5 5  Ankle plantarflexion    Ankle inversion    Ankle eversion    (Blank rows = not tested)  BED MOBILITY:   Pt reports he needs more support to get out of his bed at home due to having to get out on the L side (was getting out on the R side in the hospital) and has difficulty propping up on L shoulder due to pain  TRANSFERS: Assistive device utilized: Environmental consultant - 4 wheeled  Sit to stand: SBA Stand to sit: SBA Chair to chair: SBA Noted decreased anterior weight shift initially w/minor lateral shift to L side. Poor eccentric control w/fatigue   STAIRS: Level of Assistance: SBA Stair Negotiation Technique: Alternating Pattern  with Bilateral Rails Number of Stairs: 4  Height of Stairs: 6  Comments: Decreased eccentric control on RLE   GAIT: Gait pattern: step through pattern, lateral hip instability, trunk flexed, and wide BOS Distance walked: Various clinic distances  Assistive device utilized: None Level of assistance: Modified independence Comments: Pt is mod I w/rollator and SBA w/no AD due to anterior instability and decreased visual scanning ability   VITALS:  Vitals:   03/07/24 1245 03/07/24 1246  BP: 129/74 106/72  Pulse: (!) 59 70      CERVICAL ROM: tested in seated position  Active ROM A/ROM (deg) 12/02/23 A/ROM (deg)  12/28/23 A/ROM (deg) 01/17/24 A/ROM (deg) 02/22/24  Flexion 24 30 41 43  Extension 15 30 29 28   Right lateral flexion 13 28 10 12   Left lateral flexion 8 19 10  14  Right rotation 11 23 21  47  Left rotation 21 30 27  49   (Blank rows = not tested)                                                                                                TREATMENT :  Self-care/home management/Ther Act  Assessed vitals in seated and standing position (see above) and pt continues to be orthostatic but asymptomatic.   Discussed exercises pt can do at the gym and how to modify them. Emphasis on form, not lifting >15# to start and eccentric control. Encouraged pt to use mix of UE and LE machines but avoid chest flys and OH activities. Pt verbalized understanding.  The  following were performed for improved retropulsion control, serratus anterior strength, posterior chain strength and ankle stability:  Modified plantigrade push ups w/push away, x10 reps. Pt able to control push away well, no posterior LOB noted. However, noted significant scapular winging and pt reported pain in L shoulder w/activity  Serratus ball vertical rolls on wall using 8# med ball, x3 reps. Pt very challenged by this due to serratus anterior weakness and relied heavily on truncal lean compensation to perform.  Supine glute bridges w/feet elevated on green theraball, x5 reps. Mod verbal cues to avoid valsalva w/movement. Progressed to bridge w/hamstring curl, x10 reps. Pt very challenged w/control of movement but improved breathing strategy w/reps.   Standing calf raises with 1 kg ball between heels, x20 reps w/intermittent UE support. Pt frequently losing balance posteriorly if not holding onto chair for support. Decreased ROM noted if no UE support provided.    PATIENT EDUCATION: Education details: Continue HEP, lifts to do at gym  Person educated: Patient Education method: Explanation, Demonstration, and Verbal cues Education comprehension: verbalized understanding, returned demonstration, verbal cues required, and needs further education  HOME EXERCISE PROGRAM: From [employer]; [id number]  Desensitization Techniques: -Light touch/pressure:  using fingertip pressure to slowly move up small segments of the affected body part until outside the area of pain and then back to where you started -Deep pressure:  using fingertips to squeeze in small segments starting outside the affected area, crossing over the affected area, and then to the other side of the affected area -Tapping:  fingertips tap with firm pressure over the affected area, can move around the affected area as well -Brushing/scratching:  use fingertips to brush/scratch over and around the affected area -Textures:  use soft  and rough textured items like cotton balls vs wash cloths to brush or rub with light into firm pressure over and around the affected area  Repeat these 3-4x per day.   Verbally added 11/15/23: Supine serratus punch 3 x 10 reps Supine L biceps and ant delt stretch off EOB 3 x 30 sec Access Code: [email or web address] URL: [email or web address] Date: 11/22/2023 Prepared by: Marlon Mivaan Corbitt  Exercises - Seated Hamstring Stretch  - 1 x daily - 7 x weekly - 1-2 reps - 60-90 seconds hold - Soleus Stretch on Wall  - 1 x daily - 7 x weekly - 60-90 seconds hold - Staggered Stance Squat  - 1  x daily - 7 x weekly - 3 sets - 10 reps - Supine Cervical Retraction with Towel  - 1 x daily - 7 x weekly - 3 sets - 10 reps - Supine Cervical Circles on Pillow  - 1 x daily - 7 x weekly - 3 sets - 10 reps - Bent Over Single Arm Shoulder Row with Dumbbell  - 1 x daily - 7 x weekly - 3 sets - 10 reps  Verbally added on 12/09/23:  - Fwd/retro/lat monster walks w/green resistance band  - Prone or modified plantigrade cervical extension w/2-3s hold   Verbally added on 12/13/23: - mod plantigrade bird dogs  -serratus wall pushes   Verbally added on 02/22/24:  Tall to half kneel on floor or bed Bobbetta must be present)  GOALS: Goals reviewed with patient? Yes   NEW SHORT TERM GOALS:   Target date: 02/14/2024 -   Pt will improve both left and right cervical rotation to > 40 degrees for improved functional use of cervical spine and return to driving  Baseline:  Active ROM A/ROM (deg) 12/02/23 A/ROM (deg)  12/28/23 A/ROM (deg) 01/17/24 A/ROM (deg) 02/22/24  Flexion 24 30 41 43  Extension 15 30 29 28   Right lateral flexion 13 28 10 12   Left lateral flexion 8 19 10 14   Right rotation 11 23 21  47  Left rotation 21 30 27  49   (Blank rows = not tested)  Goal status: MET  2.  Pt will improve MiniBest to 24/28 for decreased fall risk and improvement with compensatory stepping strategies.  Baseline: 22/28;  26/28 Goal status: MET   NEW LONG TERM GOALS:  Target date: 03/13/2024  Pt will improve left and right cervical lateral flexion to >/= 25 degrees for improved functional use of cervical spine and relaxation of upper traps  Baseline:  Active ROM A/ROM (deg) 12/02/23 A/ROM (deg)  12/28/23 A/ROM (deg) 01/17/24  Flexion 24 30 41  Extension 15 30 29   Right lateral flexion 13 28 10   Left lateral flexion 8 19 10   Right rotation 11 23 21   Left rotation 21 30 27    (Blank rows = not tested)  Goal status: INITIAL  2.  Pt will hold single leg stance on BLEs without shoes on for >/= 10s for improved ankle stability and return to hiking.  Baseline:  Goal status: INITIAL     ASSESSMENT:  CLINICAL IMPRESSION: Emphasis of skilled PT session on improved posterior chain strength, serratus anterior stability for reduced scapular winging and retropulsion correction. Pt continues to be orthostatic today but is asymptomatic. Pt also reports increase in bladder spasms and leaking, resulting in poor sleep. Pt overall doing well but is limited by global deconditioning and high levels of frailty, increasing his risk of falls and fracture. Pt wanting to return to gym but would benefit from continued PT to ensure pt is safe to do so and avoids further injury. Pt continues to demonstrate instability w/retropulsion, requiring BUE support to correct balance due to impaired ankle strategy and reduced reaction time. Continue POC.     OBJECTIVE IMPAIRMENTS: Abnormal gait, decreased activity tolerance, decreased balance, decreased coordination, decreased endurance, decreased mobility, decreased strength, dizziness, impaired sensation, impaired UE functional use, improper body mechanics, and pain  ACTIVITY LIMITATIONS: carrying, lifting, bending, squatting, transfers, bed mobility, toileting, dressing, reach over head, hygiene/grooming, locomotion level, and caring for others  PARTICIPATION LIMITATIONS: meal prep,  cleaning, laundry, medication management, driving, shopping, community activity, occupation, and yard work  PERSONAL FACTORS: Fitness, Past/current experiences, and 1 comorbidity: quadriplegia are also affecting patient's functional outcome.   REHAB POTENTIAL: Excellent  CLINICAL DECISION MAKING: Evolving/moderate complexity  EVALUATION COMPLEXITY: Moderate  PLAN:  PT FREQUENCY: 1-2x/week (recert)   PT DURATION: 6 weeks + 6 weeks + 8 weeks (recert)  PLANNED INTERVENTIONS: 02835- PT Re-evaluation, 97110-Therapeutic exercises, 97530- Therapeutic activity, 97112- Neuromuscular re-education, 97535- Self Care, 02859- Manual therapy, [phone]- Gait training, [phone]- Orthotic Fit/training, [phone]- Aquatic Therapy, [phone]- Electrical stimulation (manual), Patient/Family education, Balance training, Stair training, Dry Needling, Joint mobilization, Spinal mobilization, Vestibular training, and DME instructions  PLAN FOR NEXT SESSION: Monitor vitals- pt very hypotensive. Work on return to hiking; NBOS, eyes closed, eccentric control, endurance, response to DN? L shoulder periscapular strength, does he want to schedule again with TT for DN of his L biceps and ant delt? Posterior stability (pt loses balance w/toe raises), cervical ROM. Revise HEP. Anterior weight shift, ankle proprioception, posterior tib work, stability on big toe    Mckyla Deckman E Marcayla Budge, PT, DPT  03/07/2024, 1:18 PM
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
## 2024-03-07 NOTE — Patient Instructions (Signed)
 Access Code: [email or web address] URL: [email or web address] Date: 03/07/2024 Prepared by: Clarita Pride  Exercises - Isometric Shoulder Extension at Wall  - 1 x daily - 10 reps - 5-10 hold - Isometric Shoulder Abduction at Wall  - 1 x daily - 10 reps - 5-10 hold - Standing Isometric Shoulder External Rotation with Doorway  - 1 x daily - 10 reps - 5-10 hold - Isometric Shoulder Flexion at Wall  - 1 x daily - 10 reps - 5-10 hold - Standing Isometric Shoulder Internal Rotation at Doorway  - 1 x daily - 10 reps - 5-10 hold

## 2024-03-10 ENCOUNTER — Encounter: Payer: Self-pay | Admitting: Physical Therapy

## 2024-03-10 ENCOUNTER — Telehealth: Payer: Self-pay | Admitting: Physical Therapy

## 2024-03-10 NOTE — Telephone Encounter (Signed)
 PHYSICAL THERAPY DISCHARGE SUMMARY  Visits from Start of Care: 25  Current functional level related to goals / functional outcomes: Mod I w/ADLs, impaired posterior stepping strategy w/retropulsion, recent falls in posterior direction    Remaining deficits: High fall risk, impaired sensation, global weakness    Education / Equipment: HEP   Patient agrees to discharge. Patient goals were not assessed. Patient is being discharged due to lack of approved PT visits.  Pt called and left VM informing of denial from [facility] and need to DC from PT. Provided pt w/therapist's work email and clinic number if he has any questions

## 2024-03-14 ENCOUNTER — Ambulatory Visit: Admitting: Physical Therapy

## 2024-03-14 ENCOUNTER — Encounter: Admitting: Occupational Therapy

## 2024-03-17 DIAGNOSIS — N319 Neuromuscular dysfunction of bladder, unspecified: Secondary | ICD-10-CM | POA: Diagnosis not present

## 2024-03-17 DIAGNOSIS — R319 Hematuria, unspecified: Secondary | ICD-10-CM | POA: Diagnosis not present

## 2024-03-27 ENCOUNTER — Encounter: Payer: Self-pay | Admitting: Internal Medicine

## 2024-03-27 DIAGNOSIS — S12301D Unspecified nondisplaced fracture of fourth cervical vertebra, subsequent encounter for fracture with routine healing: Secondary | ICD-10-CM

## 2024-03-27 MED ORDER — MORPHINE SULFATE ER 15 MG PO TBCR
15.0000 mg | EXTENDED_RELEASE_TABLET | Freq: Two times a day (BID) | ORAL | 0 refills | Status: AC
Start: 2024-03-27 — End: ?

## 2024-03-29 ENCOUNTER — Encounter: Payer: Self-pay | Admitting: Physical Medicine and Rehabilitation

## 2024-04-05 ENCOUNTER — Encounter: Payer: Self-pay | Admitting: Physical Medicine and Rehabilitation

## 2024-04-05 ENCOUNTER — Encounter: Attending: Physical Medicine and Rehabilitation | Admitting: Physical Medicine and Rehabilitation

## 2024-04-05 VITALS — BP 128/78 | HR 70 | Ht 68.0 in | Wt 144.0 lb

## 2024-04-05 DIAGNOSIS — G8929 Other chronic pain: Secondary | ICD-10-CM | POA: Diagnosis not present

## 2024-04-05 DIAGNOSIS — G8250 Quadriplegia, unspecified: Secondary | ICD-10-CM | POA: Diagnosis not present

## 2024-04-05 DIAGNOSIS — F329 Major depressive disorder, single episode, unspecified: Secondary | ICD-10-CM | POA: Insufficient documentation

## 2024-04-05 DIAGNOSIS — Z79899 Other long term (current) drug therapy: Secondary | ICD-10-CM | POA: Diagnosis not present

## 2024-04-05 DIAGNOSIS — Z5181 Encounter for therapeutic drug level monitoring: Secondary | ICD-10-CM | POA: Diagnosis not present

## 2024-04-05 DIAGNOSIS — G894 Chronic pain syndrome: Secondary | ICD-10-CM | POA: Insufficient documentation

## 2024-04-05 MED ORDER — GABAPENTIN 400 MG PO CAPS: 400.0000 mg | ORAL_CAPSULE | Freq: Three times a day (TID) | ORAL | 5 refills | Status: AC

## 2024-04-05 MED ORDER — TRAMADOL HCL 50 MG PO TABS: 100.0000 mg | ORAL_TABLET | Freq: Three times a day (TID) | ORAL | 5 refills | Status: AC | PRN

## 2024-04-05 NOTE — Patient Instructions (Signed)
 Pt is a 78 yr old male with hx of Incomplete quadriplegia with Central cord syndrome ASIA D- with C4 fx s/p C3/4/5 fusion- neurogenic bladder- foley, LUL PE on Eliquis ; Chronic pain on MS Contin ; Severe orthostatic hypotension, B/L shoulder pain; Here for f/u on SCI   Will increase Tramadol  to 100 mg 3x/day as needed- #180- 5 refills  2. .  I think we can cut down to Colace 1 tab BID- since not taking the amount of pain meds you were initially.   3.   Need to cancel January trip-  and can go on other vacations- just do [place] next 1- 2027.   4.  Will place referral for Dr Evalene Riff- wants talking therapy not anti-depressants.    5. Is off Midodrine - doesn't need more.    6.  Tramadol  is partially an SNRI so might help nerve pain and mood some.   7. F/U in 3 months double appt-    8. Try resistance foam- for grip can put in pocket easier than putty.
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
## 2024-04-11 ENCOUNTER — Telehealth: Payer: Self-pay

## 2024-04-11 ENCOUNTER — Encounter: Payer: Self-pay | Admitting: Internal Medicine

## 2024-04-11 ENCOUNTER — Other Ambulatory Visit: Payer: Self-pay | Admitting: Internal Medicine

## 2024-04-11 MED ORDER — PHENAZOPYRIDINE HCL 200 MG PO TABS: 200.0000 mg | ORAL_TABLET | Freq: Three times a day (TID) | ORAL | 5 refills | Status: AC | PRN

## 2024-04-11 NOTE — Telephone Encounter (Signed)
 Spoke with patient, as he walked into office upset about not getting his medication. He states he has requested refill at pharmacy but they said they had not heard from our office, but he his here now. Expressed to patient that we just received at [facility] and will be sent to pharmacy once provider reviews.

## 2024-04-11 NOTE — Telephone Encounter (Signed)
 Copied from CRM #[id number]. Topic: Clinical - Prescription Issue >> Apr 11, 2024  4:22 PM Alfonso ORN wrote: Reason for CRM: pt called  requesting office on follow up on refill request for celecoxib  (CELEBREX ) 200 MG capsule. Contacted CAL who told me to send a message to NT for more assistance.SABRA

## 2024-04-11 NOTE — Telephone Encounter (Signed)
 Medications sent to pharmacy

## 2024-04-12 NOTE — Telephone Encounter (Signed)
 This was taken care of yesterday for patient.

## 2024-04-17 DIAGNOSIS — N319 Neuromuscular dysfunction of bladder, unspecified: Secondary | ICD-10-CM | POA: Diagnosis not present

## 2024-04-28 ENCOUNTER — Other Ambulatory Visit (HOSPITAL_COMMUNITY): Payer: Self-pay

## 2024-04-28 MED ORDER — COVID-19 MRNA VAC-TRIS(PFIZER) 30 MCG/0.3ML IM SUSY
0.3000 mL | PREFILLED_SYRINGE | Freq: Once | INTRAMUSCULAR | 0 refills | Status: AC
  Filled 2024-04-28: qty 0.3, 1d supply, fill #0

## 2024-04-30 ENCOUNTER — Encounter: Payer: Self-pay | Admitting: Physical Medicine and Rehabilitation

## 2024-05-03 DIAGNOSIS — Z961 Presence of intraocular lens: Secondary | ICD-10-CM | POA: Diagnosis not present

## 2024-05-03 DIAGNOSIS — H401132 Primary open-angle glaucoma, bilateral, moderate stage: Secondary | ICD-10-CM | POA: Diagnosis not present

## 2024-05-03 DIAGNOSIS — H04123 Dry eye syndrome of bilateral lacrimal glands: Secondary | ICD-10-CM | POA: Diagnosis not present

## 2024-05-03 DIAGNOSIS — H2512 Age-related nuclear cataract, left eye: Secondary | ICD-10-CM | POA: Diagnosis not present

## 2024-05-04 ENCOUNTER — Ambulatory Visit (INDEPENDENT_AMBULATORY_CARE_PROVIDER_SITE_OTHER)

## 2024-05-04 VITALS — BP 106/79 | Ht 68.0 in | Wt 141.0 lb

## 2024-05-04 DIAGNOSIS — Z0000 Encounter for general adult medical examination without abnormal findings: Secondary | ICD-10-CM | POA: Diagnosis not present

## 2024-05-04 NOTE — Progress Notes (Signed)
 Because this visit was a virtual/telehealth visit,  certain criteria was not obtained, such a blood pressure, CBG if applicable, and timed get up and go. Any medications not marked as taking were not mentioned during the medication reconciliation part of the visit. Any vitals not documented were not able to be obtained due to this being a telehealth visit or patient was unable to self-report a recent blood pressure reading due to a lack of equipment at home via telehealth. Vitals that have been documented are verbally provided by the patient.  This visit was performed by a medical professional under my direct supervision. I was immediately available for consultation/collaboration. I have reviewed and agree with the Annual Wellness Visit documentation.  Subjective:   Brandon Watts is a 78 y.o. who presents for a Medicare Wellness preventive visit.  As a reminder, Annual Wellness Visits don't include a physical exam, and some assessments may be limited, especially if this visit is performed virtually. We may recommend an in-person follow-up visit with your provider if needed.  Visit Complete: Virtual I connected with  Amiel E Randazzo on 05/04/24 by a audio enabled telemedicine application and verified that I am speaking with the correct person using two identifiers.  Patient Location: Home  Provider Location: Home Office  I discussed the limitations of evaluation and management by telemedicine. The patient expressed understanding and agreed to proceed.  Vital Signs: Because this visit was a virtual/telehealth visit, some criteria may be missing or patient reported. Any vitals not documented were not able to be obtained and vitals that have been documented are patient reported.  VideoDeclined- This patient declined Librarian, academic. Therefore the visit was completed with audio only.  Persons Participating in Visit: Patient.  AWV Questionnaire: Yes: Patient  Medicare AWV questionnaire was completed by the patient on 04/30/2024; I have confirmed that all information answered by patient is correct and no changes since this date.  Cardiac Risk Factors include: advanced age (>84men, >5 women);male gender     Objective:    Today's Vitals   04/30/24 1259 05/04/24 1043  BP:  106/79  Weight:  141 lb (64 kg)  Height:  5' 8 (1.727 m)  PainSc: 3     Body mass index is 21.44 kg/m.     05/04/2024   10:51 AM 10/12/2023   10:23 AM 10/12/2023    9:34 AM 09/17/2023    4:04 PM 09/08/2023    6:00 PM 09/08/2023    1:52 PM 10/25/2012    1:36 PM  Advanced Directives  Does Patient Have a Medical Advance Directive? Yes Yes Yes Yes Yes No Patient has advance directive, copy in chart   Type of Advance Directive Healthcare Power of [place];Living will Healthcare Power of [place];Living will Healthcare Power of [place];Living will Healthcare Power of [place];Living will Living will;Healthcare Power of Attorney  Living will;Healthcare Power of Attorney   Does patient want to make changes to medical advance directive? No - Patient declined  Yes (Inpatient - patient defers changing a medical advance directive at this time - Information given) No - Patient declined No - Patient declined    Copy of Healthcare Power of Attorney in Chart? No - copy requested No - copy requested  No - copy requested No - copy requested    Pre-existing out of facility DNR order (yellow form or pink MOST form)       No      Data saved with a previous flowsheet row definition  Current Medications (verified) Outpatient Encounter Medications as of 05/04/2024  Medication Sig   acetaminophen  (TYLENOL ) 325 MG tablet Take 2 tablets (650 mg total) by mouth every 6 (six) hours as needed.   Baclofen  5 MG TABS Take 1 tablet (5 mg total) by mouth 3 (three) times daily as needed (Spascity).   bimatoprost (LUMIGAN) 0.01 % SOLN Apply to eye.   celecoxib  (CELEBREX ) 200 MG capsule Take 1 capsule (200  mg total) by mouth 2 (two) times daily as needed.   finasteride  (PROSCAR ) 5 MG tablet TAKE ONE TABLET ONCE DAILY   fluticasone  (FLONASE ) 50 MCG/ACT nasal spray Place 2 sprays into both nostrils daily.   gabapentin  (NEURONTIN ) 400 MG capsule Take 1 capsule (400 mg total) by mouth 3 (three) times daily.   melatonin 5 MG TABS Take 1 tablet (5 mg total) by mouth at bedtime.   morphine  (MS CONTIN ) 15 MG 12 hr tablet Take 1 tablet (15 mg total) by mouth every 12 (twelve) hours. For chronic neck pain   phenazopyridine (PYRIDIUM) 200 MG tablet Take 1 tablet (200 mg total) by mouth 3 (three) times daily as needed for pain.   polyethylene glycol (MIRALAX  / GLYCOLAX ) 17 g packet Take 17 g by mouth daily as needed (mild to moderate constipation).   QUEtiapine  (SEROQUEL ) 25 MG tablet Take 1 tablet (25 mg total) by mouth at bedtime.   senna-docusate (SENOKOT-S) 8.6-50 MG tablet Take 2 tablets by mouth 2 (two) times daily.   traMADol  (ULTRAM ) 50 MG tablet Take 2 tablets (100 mg total) by mouth 3 (three) times daily as needed for moderate pain (pain score 4-6). For chronic pain-   midodrine  (PROAMATINE ) 2.5 MG tablet Take 1 tablet (2.5 mg total) by mouth 2 (two) times daily with a meal.   No facility-administered encounter medications on file as of 05/04/2024.    Allergies (verified) Patient has no known allergies.   History: Past Medical History:  Diagnosis Date   Allergic rhinitis    Arthritis    PAIN AND OA RIGHT HIP   Arthritis    BPH (benign prostatic hyperplasia)    DDD (degenerative disc disease), lumbar    MILD-FOUND ON XRAY   Glaucoma    normotensive type with early changes   Glaucoma    Hepatitis    HEPATITIS C -CLEARED   Hepatitis    Hep C, treated and cleared   HLD (hyperlipidemia)    taking statin as preventative   Hypertension    Insomnia    CHRONIC   Insomnia    Positive PPD, treated    Pulmonary nodules    FOLLOWED BY DR. LAMAR BROCKS GATES WITH CT EXAMS-MOST RECENT 01/22/12    Status post trigger finger release    right   Past Surgical History:  Procedure Laterality Date   ANTERIOR CERVICAL DECOMP/DISCECTOMY FUSION N/A 09/09/2023   Procedure: ANTERIOR CERVICAL DECOMPRESSION/DISCECTOMY FUSION 2 LEVEL/HARDWARE REMOVAL - C 3-4 , C4-5;  Surgeon: Debby Dorn MATSU, MD;  Location: [facility] OR;  Service: Neurosurgery;  Laterality: N/A;   EYE SURGERY     LASIK EYE SURGERY   LITHOTRIPSY     x 3   LITHOTRIPSY     x2   TONSILLECTOMY     AS A CHILD   TONSILLECTOMY     TOTAL HIP ARTHROPLASTY Right 10/25/2012   Procedure: RIGHT TOTAL HIP ARTHROPLASTY ANTERIOR APPROACH;  Surgeon: Donnice JONETTA Car, MD;  Location: [facility];  Service: Orthopedics;  Laterality: Right;   TOTAL HIP ARTHROPLASTY Right  TRIGGER FINGER RELEASE Right 2016   third digit right hand. Dr.Supple   URETERAL STONE EXTRACTION S X 2     VASCECTOMY     Family History  Problem Relation Age of Onset   Hypertension Mother    Alcohol abuse Mother    Esophageal varices Mother    Depression Mother    Colon cancer Father    Hypertension Maternal Grandmother    CVA Maternal Grandmother    Heart disease Maternal Grandfather    Lung cancer Half-Sister    Social History   Socioeconomic History   Marital status: Married    Spouse name: Investment banker, operational   Number of children: 2   Years of education: 22   Highest education level: Professional school degree (e.g., MD, DDS, DVM, JD)  Occupational History   Occupation: physician    Employer: [employer]  Tobacco Use   Smoking status: Former    Current packs/day: 0.00    Average packs/day: 2.0 packs/day for 26.0 years (52.0 ttl pk-yrs)    Types: Cigarettes    Start date: 67    Quit date: 1988    Years since quitting: 37.8   Smokeless tobacco: Never   Tobacco comments:    QUIT SMOKING 1981  Vaping Use   Vaping status: Never Used  Substance and Sexual Activity   Alcohol use: Yes    Alcohol/week: 1.0 standard drink of alcohol    Types: 1 Standard drinks  or equivalent per week    Comment: 1&1/2 OZ DAILY   Drug use: Never   Sexual activity: Yes  Other Topics Concern   Not on file  Social History Narrative   ** Merged History Encounter **       Social Drivers of Health   Financial Resource Strain: Low Risk  (05/04/2024)   Overall Financial Resource Strain (CARDIA)    Difficulty of Paying Living Expenses: Not hard at all  Food Insecurity: No Food Insecurity (04/30/2024)   Hunger Vital Sign    Worried About Running Out of Food in the Last Year: Never true    Ran Out of Food in the Last Year: Never true  Transportation Needs: No Transportation Needs (05/04/2024)   PRAPARE - Administrator, Civil Service (Medical): No    Lack of Transportation (Non-Medical): No  Physical Activity: Sufficiently Active (05/04/2024)   Exercise Vital Sign    Days of Exercise per Week: 6 days    Minutes of Exercise per Session: 60 min  Stress: No Stress Concern Present (05/04/2024)   [employer] of Occupational Health - Occupational Stress Questionnaire    Feeling of Stress: Only a little  Social Connections: Moderately Integrated (05/04/2024)   Social Connection and Isolation Panel    Frequency of Communication with Friends and Family: Twice a week    Frequency of Social Gatherings with Friends and Family: Twice a week    Attends Religious Services: Never    Database administrator or Organizations: Yes    Attends Engineer, structural: More than 4 times per year    Marital Status: Married    Tobacco Counseling Counseling given: Not Answered Tobacco comments: QUIT SMOKING 1981    Clinical Intake:  Pre-visit preparation completed: Yes  Pain : 0-10 Pain Score: 3  Pain Type: Neuropathic pain     BMI - recorded: 21.44 Nutritional Status: BMI of 19-24  Normal Nutritional Risks: None Diabetes: No  Lab Results  Component Value Date   HGBA1C 5.5 08/28/2022  How often do you need to have someone help you when  you read instructions, pamphlets, or other written materials from your doctor or pharmacy?: 1 - Never  Interpreter Needed?: No  Information entered by :: Fabien Travelstead,cma   Activities of Daily Living     04/30/2024   12:59 PM 09/17/2023    4:04 PM  In your present state of health, do you have any difficulty performing the following activities:  Hearing? 0   Vision? 1   Difficulty concentrating or making decisions? 1   Walking or climbing stairs? 0   Dressing or bathing? 0   Doing errands, shopping? 0 0  Preparing Food and eating ? N   Using the Toilet? N   In the past six months, have you accidently leaked urine? Y   Do you have problems with loss of bowel control? Y   Managing your Medications? N   Managing your Finances? N   Housekeeping or managing your Housekeeping? N     Patient Care Team: Geofm Glade PARAS, MD as PCP - General (Internal Medicine) Geofm Glade PARAS, MD (Internal Medicine)  I have updated your Care Teams any recent Medical Services you may have received from other providers in the past year.     Assessment:   This is a routine wellness examination for [place].  Hearing/Vision screen Hearing Screening - Comments:: No hearing difficulties Vision Screening - Comments:: Patient wears reading glasses   Goals Addressed             This Visit's Progress    Patient Stated       Patient would like to be back in normal health and strength        Depression Screen     05/04/2024   10:52 AM 04/05/2024   12:03 PM 02/21/2024    9:13 AM 11/08/2023   11:30 AM 08/26/2022    8:25 AM 10/09/2021   11:36 AM 08/17/2021    8:54 AM  PHQ 2/9 Scores  PHQ - 2 Score 2 0 0 2 0 0 0  PHQ- 9 Score 4   12 0  0    Fall Risk     04/30/2024   12:59 PM 04/05/2024   12:03 PM 02/21/2024    9:13 AM 11/08/2023   11:30 AM 01/23/2023   11:50 AM  Fall Risk   Falls in the past year? 1 0 1 1 0  Number falls in past yr: 1 0 0 0 0  Injury with Fall? 0 0 0 1 0  Comment     admitted in hospital   Risk for fall due to : History of fall(s);Impaired mobility;Impaired balance/gait      Follow up Falls evaluation completed;Education provided        MEDICARE RISK AT HOME:  Medicare Risk at Home Any stairs in or around the home?: (Patient-Rptd) Yes If so, are there any without handrails?: (Patient-Rptd) No Home free of loose throw rugs in walkways, pet beds, electrical cords, etc?: (Patient-Rptd) No Adequate lighting in your home to reduce risk of falls?: (Patient-Rptd) Yes Life alert?: (Patient-Rptd) No Use of a cane, walker or w/c?: (Patient-Rptd) No Grab bars in the bathroom?: (Patient-Rptd) Yes Shower chair or bench in shower?: (Patient-Rptd) No Elevated toilet seat or a handicapped toilet?: (Patient-Rptd) Yes  TIMED UP AND GO:  Was the test performed?  No  Cognitive Function: 6CIT completed        05/04/2024   10:47 AM  6CIT Screen  What Year? 0 points  What month? 0 points  What time? 0 points  Count back from 20 0 points  Months in reverse 0 points  Repeat phrase 0 points  Total Score 0 points    Immunizations Immunization History  Administered Date(s) Administered   DT (Pediatric) 07/16/2006   Fluad Quad(high Dose 65+) 04/01/2022, 04/28/2024   Fluzone Influenza virus vaccine,trivalent (IIV3), split virus 05/05/2013, 04/16/2015, 04/10/2019, 04/05/2020, 05/05/2021   INFLUENZA, HIGH DOSE SEASONAL PF 07/25/2014, 03/26/2017, 03/18/2018   Influenza,inj,Quad PF,6+ Mos 05/18/2016   Moderna Covid-19 Vaccine Bivalent Booster 41yrs & up 06/17/2021   Moderna SARS-COV2 Booster Vaccination 04/01/2022   Moderna Sars-Covid-2 Vaccination 07/11/2019, 08/08/2019, 05/15/2020, 11/05/2020, 06/17/2021   Pfizer(Comirnaty )Fall Seasonal Vaccine 12 years and older 03/30/2023, 04/28/2024   Pneumococcal Conjugate-13 07/25/2014   Pneumococcal Polysaccharide-23 11/09/2011   Zoster, Live 10/14/2012    Screening Tests Health Maintenance  Topic Date Due    DTaP/Tdap/Td (2 - Tdap) 07/16/2016   Pneumococcal Vaccine: 50+ Years (3 of 3 - PCV20 or PCV21) 07/26/2019   COVID-19 Vaccine (8 - Moderna risk 2025-26 season) 10/27/2024   Medicare Annual Wellness (AWV)  05/04/2025   Influenza Vaccine  Completed   Meningococcal B Vaccine  Aged Out   Lung Cancer Screening  Discontinued   Colonoscopy  Discontinued   Hepatitis C Screening  Discontinued   Zoster Vaccines- Shingrix  Discontinued    Health Maintenance Items Addressed:patient declined  Additional Screening:  Vision Screening: Recommended annual ophthalmology exams for early detection of glaucoma and other disorders of the eye. Is the patient up to date with their annual eye exam?  yes   Dental Screening: Recommended annual dental exams for proper oral hygiene  Community Resource Referral / Chronic Care Management: CRR required this visit?  No   CCM required this visit?  No   Plan:    I have personally reviewed and noted the following in the patient's chart:   Medical and social history Use of alcohol, tobacco or illicit drugs  Current medications and supplements including opioid prescriptions. Patient is not currently taking opioid prescriptions. Functional ability and status Nutritional status Physical activity Advanced directives List of other physicians Hospitalizations, surgeries, and ER visits in previous 12 months Vitals Screenings to include cognitive, depression, and falls Referrals and appointments  In addition, I have reviewed and discussed with patient certain preventive protocols, quality metrics, and best practice recommendations. A written personalized care plan for preventive services as well as general preventive health recommendations were provided to patient.   Lyle MARLA Right, [place]   05/04/2024   After Visit Summary: (MyChart) Due to this being a telephonic visit, the after visit summary with patients personalized plan was offered to patient via  MyChart   Notes: Nothing significant to report at this time.

## 2024-05-04 NOTE — Patient Instructions (Signed)
 Dr. Harlow,  Thank you for taking the time for your Medicare Wellness Visit. I appreciate your continued commitment to your health goals. Please review the care plan we discussed, and feel free to reach out if I can assist you further.  Medicare recommends these wellness visits once per year to help you and your care team stay ahead of potential health issues. These visits are designed to focus on prevention, allowing your provider to concentrate on managing your acute and chronic conditions during your regular appointments.  Please note that Annual Wellness Visits do not include a physical exam. Some assessments may be limited, especially if the visit was conducted virtually. If needed, we may recommend a separate in-person follow-up with your provider.  Ongoing Care Seeing your primary care provider every 3 to 6 months helps us  monitor your health and provide consistent, personalized care.   Referrals If a referral was made during today's visit and you haven't received any updates within two weeks, please contact the referred provider directly to check on the status.  Recommended Screenings:  Health Maintenance  Topic Date Due   DTaP/Tdap/Td vaccine (2 - Tdap) 07/16/2016   Pneumococcal Vaccine for age over 51 (3 of 3 - PCV20 or PCV21) 07/26/2019   COVID-19 Vaccine (8 - Moderna risk 2025-26 season) 10/27/2024   Medicare Annual Wellness Visit  05/04/2025   Flu Shot  Completed   Meningitis B Vaccine  Aged Out   Screening for Lung Cancer  Discontinued   Colon Cancer Screening  Discontinued   Hepatitis C Screening  Discontinued   Zoster (Shingles) Vaccine  Discontinued       05/04/2024   10:51 AM  Advanced Directives  Does Patient Have a Medical Advance Directive? Yes  Type of Estate agent of [place];Living will  Does patient want to make changes to medical advance directive? No - Patient declined  Copy of Healthcare Power of Attorney in Chart? No - copy  requested   Advance Care Planning is important because it: Ensures you receive medical care that aligns with your values, goals, and preferences. Provides guidance to your family and loved ones, reducing the emotional burden of decision-making during critical moments.  Vision: Annual vision screenings are recommended for early detection of glaucoma, cataracts, and diabetic retinopathy. These exams can also reveal signs of chronic conditions such as diabetes and high blood pressure.  Dental: Annual dental screenings help detect early signs of oral cancer, gum disease, and other conditions linked to overall health, including heart disease and diabetes.  Please see the attached documents for additional preventive care recommendations.

## 2024-05-07 ENCOUNTER — Encounter: Payer: Self-pay | Admitting: Physical Medicine and Rehabilitation

## 2024-05-24 ENCOUNTER — Encounter: Payer: Self-pay | Admitting: Internal Medicine

## 2024-05-24 DIAGNOSIS — F329 Major depressive disorder, single episode, unspecified: Secondary | ICD-10-CM | POA: Insufficient documentation

## 2024-05-24 NOTE — Progress Notes (Signed)
 Subjective:    Patient ID: Brandon Watts, male    DOB: 1946/03/03, 78 y.o.   MRN: [id number]     HPI [place] is here for a physical exam and his chronic medical problems.  Chronic pain fairly controlled He is independent in his ADLs.  Reactive depression-referred to a therapist    Strength? Any element of dysphagia still ?  Paresthesias - LEs - aeas hypersensitiy, other areas nubness    UE - weights- > soreness  Cello - playing causes left shoulder pain  Several bad falls - 4 - all not syncopal.  -  just loses control-neurogenic hypotension  Baclofen  bid   Taking midodrine  bid.     Tropsium for bladder spasms  Tinea coropros - left lower leg - imporved with tinactin.     Walking most days.  Trying to do some weights in his upper extremities, but that does cause significant pain after a couple of days.  Medications and allergies reviewed with patient and updated if appropriate.  Current Outpatient Medications on File Prior to Visit  Medication Sig Dispense Refill   acetaminophen  (TYLENOL ) 325 MG tablet Take 2 tablets (650 mg total) by mouth every 6 (six) hours as needed.     Baclofen  5 MG TABS Take 1 tablet (5 mg total) by mouth 3 (three) times daily as needed (Spascity). 60 tablet 5   bimatoprost (LUMIGAN) 0.01 % SOLN Apply to eye.     celecoxib  (CELEBREX ) 200 MG capsule Take 1 capsule (200 mg total) by mouth 2 (two) times daily as needed. 180 capsule 1   finasteride  (PROSCAR ) 5 MG tablet TAKE ONE TABLET ONCE DAILY 90 tablet 3   fluticasone  (FLONASE ) 50 MCG/ACT nasal spray Place 2 sprays into both nostrils daily.     gabapentin  (NEURONTIN ) 400 MG capsule Take 1 capsule (400 mg total) by mouth 3 (three) times daily. 90 capsule 5   melatonin 5 MG TABS Take 1 tablet (5 mg total) by mouth at bedtime. 30 tablet 0   mirabegron ER (MYRBETRIQ) 25 MG TB24 tablet Take 25 mg by mouth.     mometasone (NASONEX) 50 MCG/ACT nasal spray Place into the nose.     morphine   (MS CONTIN ) 15 MG 12 hr tablet Take 1 tablet (15 mg total) by mouth every 12 (twelve) hours. For chronic neck pain 60 tablet 0   phenazopyridine (PYRIDIUM) 200 MG tablet Take 1 tablet (200 mg total) by mouth 3 (three) times daily as needed for pain. 90 tablet 5   polyethylene glycol (MIRALAX  / GLYCOLAX ) 17 g packet Take 17 g by mouth daily as needed (mild to moderate constipation).     QUEtiapine  (SEROQUEL ) 25 MG tablet Take 1 tablet (25 mg total) by mouth at bedtime. 30 tablet 5   senna-docusate (SENOKOT-S) 8.6-50 MG tablet Take 2 tablets by mouth 2 (two) times daily.     traMADol  (ULTRAM ) 50 MG tablet Take 2 tablets (100 mg total) by mouth 3 (three) times daily as needed for moderate pain (pain score 4-6). For chronic pain- 180 tablet 5   No current facility-administered medications on file prior to visit.    Review of Systems  Constitutional:  Positive for appetite change (decreased) and diaphoresis. Negative for fever.  Eyes:  Negative for visual disturbance.  Respiratory:  Negative for cough, shortness of breath and wheezing.   Cardiovascular:  Negative for chest pain, palpitations and leg swelling.  Gastrointestinal:  Positive for constipation. Negative for abdominal pain, blood in stool  and diarrhea.       No gerd  Genitourinary:  Negative for hematuria.  Musculoskeletal:  Positive for myalgias. Negative for arthralgias and back pain.  Skin:  Positive for rash (left upper lower leg).  Neurological:  Positive for light-headedness. Negative for headaches.  Psychiatric/Behavioral:  Positive for dysphoric mood and sleep disturbance. The patient is not nervous/anxious.        Objective:   Vitals:   05/25/24 1040 05/25/24 1043  BP: 110/60 98/60  Pulse: 82   Temp: 98.2 F (36.8 C)   SpO2: 95%    Filed Weights   05/25/24 1040  Weight: 142 lb (64.4 kg)   Body mass index is 21.59 kg/m.  BP Readings from Last 3 Encounters:  05/25/24 98/60  05/04/24 106/79  04/05/24 128/78     Wt Readings from Last 3 Encounters:  05/25/24 142 lb (64.4 kg)  05/04/24 141 lb (64 kg)  04/05/24 144 lb (65.3 kg)      Physical Exam Constitutional: He appears well-developed and well-nourished. No distress.  HENT:  Head: Normocephalic and atraumatic.  Right Ear: External ear normal.  Left Ear: External ear normal.  Normal ear canals and TM b/l  Mouth/Throat: Oropharynx is clear and moist. Eyes: Conjunctivae and EOM are normal.  Neck: Neck supple. No tracheal deviation present. No thyromegaly present.  No carotid bruit  Cardiovascular: Normal rate, regular rhythm, normal heart sounds and intact distal pulses.   No murmur heard.  No lower extremity edema. Pulmonary/Chest: Effort normal and breath sounds normal. No respiratory distress. He has no wheezes. He has no rales.  Abdominal: Soft. He exhibits no distension. There is no tenderness.  Suprapubic catheter in place without active bleeding or surrounding erythema Genitourinary: deferred  Lymphadenopathy:   He has no cervical adenopathy.  Skin: Skin is warm and dry. He is not diaphoretic.  Rash left proximal anterior lower leg-improving with antifungal cream Psychiatric: He has a normal mood and affect. His behavior is normal.         Assessment & Plan:   Physical exam: Screening blood work  ordered Exercise   regular Weight  is normal Substance abuse   none   Reviewed recommended immunizations.   Health Maintenance  Topic Date Due   DTaP/Tdap/Td (2 - Tdap) 07/16/2016   Pneumococcal Vaccine: 50+ Years (3 of 3 - PCV20 or PCV21) 07/26/2019   COVID-19 Vaccine (8 - Moderna risk 2025-26 season) 10/27/2024   Medicare Annual Wellness (AWV)  05/04/2025   Influenza Vaccine  Completed   Meningococcal B Vaccine  Aged Out   Lung Cancer Screening  Discontinued   Colonoscopy  Discontinued   Hepatitis C Screening  Discontinued   Zoster Vaccines- Shingrix  Discontinued     See Problem List for Assessment and Plan of  chronic medical problems.

## 2024-05-24 NOTE — Patient Instructions (Addendum)
 Medications changes include :   None     Return in about 1 year (around 05/25/2025) for Physical Exam.     Health Maintenance, Male Adopting a healthy lifestyle and getting preventive care are important in promoting health and wellness. Ask your health care provider about: The right schedule for you to have regular tests and exams. Things you can do on your own to prevent diseases and keep yourself healthy. What should I know about diet, weight, and exercise? Eat a healthy diet  Eat a diet that includes plenty of vegetables, fruits, low-fat dairy products, and lean protein. Do not eat a lot of foods that are high in solid fats, added sugars, or sodium. Maintain a healthy weight Body mass index (BMI) is a measurement that can be used to identify possible weight problems. It estimates body fat based on height and weight. Your health care provider can help determine your BMI and help you achieve or maintain a healthy weight. Get regular exercise Get regular exercise. This is one of the most important things you can do for your health. Most adults should: Exercise for at least 150 minutes each week. The exercise should increase your heart rate and make you sweat (moderate-intensity exercise). Do strengthening exercises at least twice a week. This is in addition to the moderate-intensity exercise. Spend less time sitting. Even light physical activity can be beneficial. Watch cholesterol and blood lipids Have your blood tested for lipids and cholesterol at 77 years of age, then have this test every 5 years. You may need to have your cholesterol levels checked more often if: Your lipid or cholesterol levels are high. You are older than 77 years of age. You are at high risk for heart disease. What should I know about cancer screening? Many types of cancers can be detected early and may often be prevented. Depending on your health history and family history, you may need to have cancer  screening at various ages. This may include screening for: Colorectal cancer. Prostate cancer. Skin cancer. Lung cancer. What should I know about heart disease, diabetes, and high blood pressure? Blood pressure and heart disease High blood pressure causes heart disease and increases the risk of stroke. This is more likely to develop in people who have high blood pressure readings or are overweight. Talk with your health care provider about your target blood pressure readings. Have your blood pressure checked: Every 3-5 years if you are 71-38 years of age. Every year if you are 55 years old or older. If you are between the ages of 33 and 72 and are a current or former smoker, ask your health care provider if you should have a one-time screening for abdominal aortic aneurysm (AAA). Diabetes Have regular diabetes screenings. This checks your fasting blood sugar level. Have the screening done: Once every three years after age 31 if you are at a normal weight and have a low risk for diabetes. More often and at a younger age if you are overweight or have a high risk for diabetes. What should I know about preventing infection? Hepatitis B If you have a higher risk for hepatitis B, you should be screened for this virus. Talk with your health care provider to find out if you are at risk for hepatitis B infection. Hepatitis C Blood testing is recommended for: Everyone born from [birth year] through 1964. Anyone with known risk factors for hepatitis C. Sexually transmitted infections (STIs) You should be screened each year for STIs,  including gonorrhea and chlamydia, if: You are sexually active and are younger than 77 years of age. You are older than 77 years of age and your health care provider tells you that you are at risk for this type of infection. Your sexual activity has changed since you were last screened, and you are at increased risk for chlamydia or gonorrhea. Ask your health care provider if  you are at risk. Ask your health care provider about whether you are at high risk for HIV. Your health care provider may recommend a prescription medicine to help prevent HIV infection. If you choose to take medicine to prevent HIV, you should first get tested for HIV. You should then be tested every 3 months for as long as you are taking the medicine. Follow these instructions at home: Alcohol use Do not drink alcohol if your health care provider tells you not to drink. If you drink alcohol: Limit how much you have to 0-2 drinks a day. Know how much alcohol is in your drink. In the [place] one drink equals one 12 oz bottle of beer (355 mL), one 5 oz glass of wine (148 mL), or one 1 oz glass of hard liquor (44 mL). Lifestyle Do not use any products that contain nicotine or tobacco. These products include cigarettes, chewing tobacco, and vaping devices, such as e-cigarettes. If you need help quitting, ask your health care provider. Do not use street drugs. Do not share needles. Ask your health care provider for help if you need support or information about quitting drugs. General instructions Schedule regular health, dental, and eye exams. Stay current with your vaccines. Tell your health care provider if: You often feel depressed. You have ever been abused or do not feel safe at home. Summary Adopting a healthy lifestyle and getting preventive care are important in promoting health and wellness. Follow your health care provider's instructions about healthy diet, exercising, and getting tested or screened for diseases. Follow your health care provider's instructions on monitoring your cholesterol and blood pressure. This information is not intended to replace advice given to you by your health care provider. Make sure you discuss any questions you have with your health care provider. Document Revised: 11/18/2020 Document Reviewed: 11/18/2020 Elsevier Patient Education  2024 [employer].

## 2024-05-24 NOTE — Assessment & Plan Note (Addendum)
 Chronic Sees Dr. Smith-neurology at [facility] Has suprapubic catheter, which is changed every 4 weeks Has some irritation and bleeding at the site of suprapubic catheter Trying to clamp and use the bathroom on his own intermittently and hopefully over the next several months bladder may recover Has bladder spasms-takes Myrbetriq

## 2024-05-25 ENCOUNTER — Ambulatory Visit: Admitting: Internal Medicine

## 2024-05-25 VITALS — BP 98/60 | HR 82 | Temp 98.2°F | Ht 68.0 in | Wt 142.0 lb

## 2024-05-25 DIAGNOSIS — N319 Neuromuscular dysfunction of bladder, unspecified: Secondary | ICD-10-CM

## 2024-05-25 DIAGNOSIS — G4709 Other insomnia: Secondary | ICD-10-CM

## 2024-05-25 DIAGNOSIS — Z0000 Encounter for general adult medical examination without abnormal findings: Secondary | ICD-10-CM

## 2024-05-25 DIAGNOSIS — N400 Enlarged prostate without lower urinary tract symptoms: Secondary | ICD-10-CM

## 2024-05-25 DIAGNOSIS — K5909 Other constipation: Secondary | ICD-10-CM

## 2024-05-25 DIAGNOSIS — G903 Multi-system degeneration of the autonomic nervous system: Secondary | ICD-10-CM

## 2024-05-25 DIAGNOSIS — G8252 Quadriplegia, C1-C4 incomplete: Secondary | ICD-10-CM

## 2024-05-25 DIAGNOSIS — I951 Orthostatic hypotension: Secondary | ICD-10-CM

## 2024-05-25 DIAGNOSIS — Z86711 Personal history of pulmonary embolism: Secondary | ICD-10-CM

## 2024-05-25 DIAGNOSIS — F329 Major depressive disorder, single episode, unspecified: Secondary | ICD-10-CM

## 2024-05-25 DIAGNOSIS — I5189 Other ill-defined heart diseases: Secondary | ICD-10-CM

## 2024-05-25 MED ORDER — MIDODRINE HCL 2.5 MG PO TABS: 2.5000 mg | ORAL_TABLET | Freq: Two times a day (BID) | ORAL | Status: DC

## 2024-05-25 NOTE — Assessment & Plan Note (Addendum)
 Chronic Neurogenic and medication driven Taking Senokot daily in the morning and MiraLAX  daily at night Uses Docusate suppository as needed or glycerin suppository as needed

## 2024-05-25 NOTE — Assessment & Plan Note (Addendum)
 Chronic BP dropping with standing and has resulted in multiple falls Back on midodrine  2.5 mg twice daily Start wearing bilateral leg compression again Do better with keeping up with fluids He is very careful when changing positions He will continue to monitor his BP at home

## 2024-05-25 NOTE — Assessment & Plan Note (Addendum)
 Secondary to C4 cervical fracture, central cord syndrome Symptoms have improved-he has regained some strength, his swallowing has improved, dysarthria has improved, but he still has decreased strength and some dysarthria He has paresthesias mostly in the lower extremities He has pain in his upper back, shoulders and arms He has spasticity-takes baclofen  twice daily For pain MS Contin  15 mg every 12 hours as needed, tramadol  100 mg 3 times daily as needed Following with Dr. Lovorn for management

## 2024-05-25 NOTE — Assessment & Plan Note (Addendum)
 Chronic Not on any medication currently-May need to consider medication Has been referred to a therapist and has an appointment later this month

## 2024-05-25 NOTE — Assessment & Plan Note (Signed)
 History of PE while hospitalized-left-sided PE with no acute right heart strain Completed treatment with Eliquis 

## 2024-05-25 NOTE — Assessment & Plan Note (Signed)
 Chronic Normal for age Asymptomatic

## 2024-05-25 NOTE — Assessment & Plan Note (Signed)
 Chronic Takes multiple medications at night some which helped him sleep If needed he takes Seroquel  25 mg

## 2024-05-25 NOTE — Assessment & Plan Note (Signed)
 Chronic Continue finasteride  5 mg daily

## 2024-05-26 ENCOUNTER — Encounter: Payer: Self-pay | Admitting: Physical Medicine and Rehabilitation

## 2024-06-07 ENCOUNTER — Encounter: Attending: Physical Medicine and Rehabilitation | Admitting: Psychology

## 2024-06-07 DIAGNOSIS — Z79899 Other long term (current) drug therapy: Secondary | ICD-10-CM | POA: Insufficient documentation

## 2024-06-07 DIAGNOSIS — Z5181 Encounter for therapeutic drug level monitoring: Secondary | ICD-10-CM | POA: Insufficient documentation

## 2024-06-07 DIAGNOSIS — F329 Major depressive disorder, single episode, unspecified: Secondary | ICD-10-CM | POA: Insufficient documentation

## 2024-06-07 DIAGNOSIS — G8929 Other chronic pain: Secondary | ICD-10-CM | POA: Insufficient documentation

## 2024-06-07 DIAGNOSIS — G894 Chronic pain syndrome: Secondary | ICD-10-CM | POA: Insufficient documentation

## 2024-06-07 DIAGNOSIS — G8250 Quadriplegia, unspecified: Secondary | ICD-10-CM | POA: Insufficient documentation

## 2024-06-12 ENCOUNTER — Encounter: Attending: Physical Medicine and Rehabilitation | Admitting: Physical Medicine and Rehabilitation

## 2024-06-12 ENCOUNTER — Encounter: Payer: Self-pay | Admitting: Physical Medicine and Rehabilitation

## 2024-06-12 VITALS — BP 95/62 | HR 77 | Ht 68.0 in | Wt 148.0 lb

## 2024-06-12 DIAGNOSIS — G903 Multi-system degeneration of the autonomic nervous system: Secondary | ICD-10-CM | POA: Insufficient documentation

## 2024-06-12 DIAGNOSIS — K592 Neurogenic bowel, not elsewhere classified: Secondary | ICD-10-CM | POA: Diagnosis not present

## 2024-06-12 DIAGNOSIS — G8252 Quadriplegia, C1-C4 incomplete: Secondary | ICD-10-CM | POA: Diagnosis present

## 2024-06-12 DIAGNOSIS — R252 Cramp and spasm: Secondary | ICD-10-CM | POA: Diagnosis not present

## 2024-06-12 DIAGNOSIS — F329 Major depressive disorder, single episode, unspecified: Secondary | ICD-10-CM | POA: Diagnosis present

## 2024-06-12 MED ORDER — BACLOFEN 5 MG PO TABS: 5.0000 mg | ORAL_TABLET | Freq: Three times a day (TID) | ORAL | 5 refills | Status: AC | PRN

## 2024-06-12 MED ORDER — MIDODRINE HCL 5 MG PO TABS: 5.0000 mg | ORAL_TABLET | Freq: Two times a day (BID) | ORAL | 5 refills | Status: AC

## 2024-06-12 NOTE — Patient Instructions (Signed)
 Pt is a 78 yr old male with hx of Incomplete quadriplegia with Central cord syndrome ASIA D- with C4 fx s/p C3/4/5 fusion- neurogenic bladder- foley, LUL PE on Eliquis ; Chronic pain on MS Contin ; Severe orthostatic hypotension, B/L shoulder pain;     For persistent Orthostatic hypotension- Will increase Midodrine  to 5 mg 2x/day- and wear TEDs. - decided against Florinef.   2.  We discussed Bowel program and incontinence.  1. Bowel program for SCI patients (BP)   Use Dulcolax suppositories Start with doing bowel program within 1 hour after a meal- breakfast or dinner Take bowels meds -Po/oral at opposite time doing bowel program- if BP (bowel program) in evening, give PO meds in AM; and vice versa. Wait on this   Start with digital stimulation- up to 2nd knuckle- stimulate rectum x 30 seconds Place suppository- wait 10 minutes Do Dig stim/Digital stimulation) every 10-15 minutes until empty If gets severely constipated, >3 days, can use Magnesium Citrate and follow in 4 hours with suppository or any type of enema.  Takes 6 weeks or so to get bowel to have a habit.     9. Takes PO bowel meds 8-12 hours before want so to have BM.   10.  If on results in 3 days, do bowel program- and if has incontinence- then move to q2 days.   11.  Needs to find a person for his 988.    12.   Depression- We discussed his BP 140s-150's- hx of borderline HTN in past.    13.  Con't Baclofen  5 mg TID prn For spasticity-   14. Con't Gabapentin  400 mg 3x/day- for nerve pain   15. Got MS Contin  from PCP.  Not taking MS Contin - only 1x in last 2-    16. Con't Tramadol - 100 mg- doesn't need refills- con't- isn't due for drug screen per clinic policy.   17. F/U in 3 months- double appt- SCI  18. Gets Tropsium from Dr Claudene

## 2024-06-12 NOTE — Progress Notes (Signed)
 Subjective:    Patient ID: TERRI MALERBA, male    DOB: 12-Nov-1945, 78 y.o.   MRN: [id number]  HPI Pt is a 78 yr old male with hx of Incomplete quadriplegia with Central cord syndrome ASIA D- with C4 fx s/p C3/4/5 fusion- neurogenic bladder- foley, LUL PE on Eliquis ; Chronic pain on MS Contin ; Severe orthostatic hypotension, B/L shoulder pain;    Is doing stable most of time.    No more falls since 2 weeks ago  Still Orthostatic On Midodrine  25 mg BID  Suggested to go back to TEDs- even with TEDs, is orthostatic.   Been running 120's sitting and 85-90/60's-  Drop is 15-20 mm Hg.   Getting discouraged.   Saw Dr Hayden-  2 hour visit.  Thinks he'll be helpful.  Is he always going to be in pain and doing physical activity.  Is rakes 30-40 minutes - is exhausted ruined.  Cannot use leaf blower- cannot crank it.   When does UB strength exam- using 5 lbs weights- can do it Cannot increase isometric  And pays with increase in pain- dramatic Will skip HEP because so painful afterwards- 6-8 hours and persists for 12-24 hours. So sore. .   Neurogenic colon- 2 episodes in last 2 weeks-  Bowel incontinence- hiking in woods.  Felt urge- and then already occurred when it checked .  Soiled and difficult to deal with.  Took a long walk-  felt like needed to go and couldn't make it home.  Very difficult to deal with embarrassment.   Periodically having bowel incontinence.  Tries to balance right combo of bowel meds.  Doesn't  do Bowel program. Uses suppository of no BM in 3 days.  Occ uses suppository. 2x in last 2-3 weeks.   Doesn't remember if was on day 23 since LBM when had incontinence.   Didn't take tramadol - or gabapentin  this AM- since makes him sleep after   Practicing cello- has to stop due to shoulder pain- esp on L.  Is thinking of it as OT.    Decreased to light touch- everywhere except head.    Catheter stopped working last night- and was kinked/blocked-  Was  able to void!!! To empty bladder.   Pain Inventory Average Pain 4 Pain Right Now 3 My pain is constant, sharp, burning, and tingling  In the last 24 hours, has pain interfered with the following? General activity 3 Relation with others 2 Enjoyment of life 6 What TIME of day is your pain at its worst? morning  Sleep (in general) Fair  Pain is worse with: some activites Pain improves with: rest, medication, and heat Relief from Meds: 7  Family History  Problem Relation Age of Onset   Hypertension Mother    Alcohol abuse Mother    Esophageal varices Mother    Depression Mother    Colon cancer Father    Hypertension Maternal Grandmother    CVA Maternal Grandmother    Heart disease Maternal Grandfather    Lung cancer Half-Sister    Social History   Socioeconomic History   Marital status: Married    Spouse name: helen katherine   Number of children: 2   Years of education: 22   Highest education level: Professional school degree (e.g., MD, DDS, DVM, JD)  Occupational History   Occupation: physician    Employer: [employer]  Tobacco Use   Smoking status: Former    Current packs/day: 0.00    Average packs/day: 2.0 packs/day  for 26.0 years (52.0 ttl pk-yrs)    Types: Cigarettes    Start date: 25    Quit date: 1988    Years since quitting: 37.9   Smokeless tobacco: Never   Tobacco comments:    QUIT SMOKING 1981  Vaping Use   Vaping status: Never Used  Substance and Sexual Activity   Alcohol use: Yes    Alcohol/week: 1.0 standard drink of alcohol    Types: 1 Standard drinks or equivalent per week    Comment: 1&1/2 OZ DAILY   Drug use: Never   Sexual activity: Yes  Other Topics Concern   Not on file  Social History Narrative   ** Merged History Encounter **       Social Drivers of Health   Financial Resource Strain: Low Risk  (05/04/2024)   Overall Financial Resource Strain (CARDIA)    Difficulty of Paying Living Expenses: Not hard at all  Food  Insecurity: No Food Insecurity (04/30/2024)   Hunger Vital Sign    Worried About Running Out of Food in the Last Year: Never true    Ran Out of Food in the Last Year: Never true  Transportation Needs: No Transportation Needs (05/04/2024)   PRAPARE - Administrator, Civil Service (Medical): No    Lack of Transportation (Non-Medical): No  Physical Activity: Sufficiently Active (05/04/2024)   Exercise Vital Sign    Days of Exercise per Week: 6 days    Minutes of Exercise per Session: 60 min  Stress: No Stress Concern Present (05/04/2024)   [employer] of Occupational Health - Occupational Stress Questionnaire    Feeling of Stress: Only a little  Social Connections: Moderately Integrated (05/04/2024)   Social Connection and Isolation Panel    Frequency of Communication with Friends and Family: Twice a week    Frequency of Social Gatherings with Friends and Family: Twice a week    Attends Religious Services: Never    Database Administrator or Organizations: Yes    Attends Engineer, Structural: More than 4 times per year    Marital Status: Married   Past Surgical History:  Procedure Laterality Date   ANTERIOR CERVICAL DECOMP/DISCECTOMY FUSION N/A 09/09/2023   Procedure: ANTERIOR CERVICAL DECOMPRESSION/DISCECTOMY FUSION 2 LEVEL/HARDWARE REMOVAL - C 3-4 , C4-5;  Surgeon: Debby Dorn MATSU, MD;  Location: [facility] OR;  Service: Neurosurgery;  Laterality: N/A;   EYE SURGERY     LASIK EYE SURGERY   LITHOTRIPSY     x 3   LITHOTRIPSY     x2   TONSILLECTOMY     AS A CHILD   TONSILLECTOMY     TOTAL HIP ARTHROPLASTY Right 10/25/2012   Procedure: RIGHT TOTAL HIP ARTHROPLASTY ANTERIOR APPROACH;  Surgeon: Donnice JONETTA Car, MD;  Location: [facility] ORS;  Service: Orthopedics;  Laterality: Right;   TOTAL HIP ARTHROPLASTY Right    TRIGGER FINGER RELEASE Right 2016   third digit right hand. Dr.Supple   URETERAL STONE EXTRACTION S X 2     VASCECTOMY     Past Surgical History:   Procedure Laterality Date   ANTERIOR CERVICAL DECOMP/DISCECTOMY FUSION N/A 09/09/2023   Procedure: ANTERIOR CERVICAL DECOMPRESSION/DISCECTOMY FUSION 2 LEVEL/HARDWARE REMOVAL - C 3-4 , C4-5;  Surgeon: Debby Dorn MATSU, MD;  Location: [facility] OR;  Service: Neurosurgery;  Laterality: N/A;   EYE SURGERY     LASIK EYE SURGERY   LITHOTRIPSY     x 3   LITHOTRIPSY     x2  TONSILLECTOMY     AS A CHILD   TONSILLECTOMY     TOTAL HIP ARTHROPLASTY Right 10/25/2012   Procedure: RIGHT TOTAL HIP ARTHROPLASTY ANTERIOR APPROACH;  Surgeon: Donnice JONETTA Car, MD;  Location: [facility] ORS;  Service: Orthopedics;  Laterality: Right;   TOTAL HIP ARTHROPLASTY Right    TRIGGER FINGER RELEASE Right 2016   third digit right hand. Dr.Supple   URETERAL STONE EXTRACTION S X 2     VASCECTOMY     Past Medical History:  Diagnosis Date   Allergic rhinitis    Arthritis    PAIN AND OA RIGHT HIP   Arthritis    BPH (benign prostatic hyperplasia)    DDD (degenerative disc disease), lumbar    MILD-FOUND ON XRAY   Glaucoma    normotensive type with early changes   Glaucoma    Hepatitis    HEPATITIS C -CLEARED   Hepatitis    Hep C, treated and cleared   HLD (hyperlipidemia)    taking statin as preventative   Hypertension    Insomnia    CHRONIC   Insomnia    Positive PPD, treated    Pulmonary nodules    FOLLOWED BY DR. LAMAR SAILOR. GATES WITH CT EXAMS-MOST RECENT 01/22/12   Status post trigger finger release    right   There were no vitals taken for this visit.  Opioid Risk Score:   Fall Risk Score:  `1  Depression screen [facility] 2/9     05/04/2024   10:52 AM 04/05/2024   12:03 PM 02/21/2024    9:13 AM 11/08/2023   11:30 AM 08/26/2022    8:25 AM 10/09/2021   11:36 AM 08/17/2021    8:54 AM  Depression screen PH[facility] 2/9  Decreased Interest 1 0 0 1 0 0 0  Down, Depressed, Hopeless 1 0 0 1 0 0 0  PHQ - 2 Score 2 0 0 2 0 0 0  Altered sleeping 1   0 0  0  Tired, decreased energy 0   3 0  0  Change in appetite 1   0 0  0   Feeling bad or failure about yourself  0   0 0  0  Trouble concentrating 0   3 0  0  Moving slowly or fidgety/restless 0   3 0  0  Suicidal thoughts 0   1 0  0  PHQ-9 Score 4    12  0   0   Difficult doing work/chores Very difficult   Very difficult Not difficult at all       Data saved with a previous flowsheet row definition    Review of Systems  Musculoskeletal:  Positive for back pain.       Pain from shoulder to shoulder Left arm & right arm       Objective:   Physical Exam  Awake alert, no assistive device, NAD Alone today RUE- deltoids 4+/5; biceps 5-/5, triceps 5-/5; WE 5-/5 and Grip 5-/5 and FA 5-/5 LUE- deltoids 4+/5; biceps 5-/5 but slightly less; WE 5-/5; but less- Grip 5-/5 but less; FA 4+/5 RLE- HF 5-/5; KE 5-/5; DF and PF 5/5 LLE- HF 4+/5; KE/DF and PF 5-/5         Assessment & Plan:   Pt is a 78 yr old male with hx of Incomplete quadriplegia with Central cord syndrome ASIA D- with C4 fx s/p C3/4/5 fusion- neurogenic bladder- foley, LUL PE on Eliquis ; Chronic pain on MS Contin ;  Severe orthostatic hypotension, B/L shoulder pain;     For persistent Orthostatic hypotension- Will increase Midodrine  to 5 mg 2x/day- and wear TEDs. - decided against Florinef.   2.  We discussed Bowel program and incontinence.  1. Bowel program for SCI patients (BP)   Use Dulcolax suppositories Start with doing bowel program within 1 hour after a meal- breakfast or dinner Take bowels meds -Po/oral at opposite time doing bowel program- if BP (bowel program) in evening, give PO meds in AM; and vice versa. Wait on this   Start with digital stimulation- up to 2nd knuckle- stimulate rectum x 30 seconds Place suppository- wait 10 minutes Do Dig stim/Digital stimulation) every 10-15 minutes until empty If gets severely constipated, >3 days, can use Magnesium Citrate and follow in 4 hours with suppository or any type of enema.  Takes 6 weeks or so to get bowel to have a habit.      9. Takes PO bowel meds 8-12 hours before want so to have BM.   10.  If on results in 3 days, do bowel program- and if has incontinence- then move to q2 days.   11.  Needs to find a person for his 988.    12.   Depression- We discussed his BP 140s-150's- hx of borderline HTN in past.    13.  Con't Baclofen  5 mg TID prn For spasticity-   14. Con't Gabapentin  400 mg 3x/day- for nerve pain   15. Got MS Contin  from PCP.  Not taking MS Contin - only 1x in last 2-    16. Con't Tramadol - 100 mg- doesn't need refills- con't- isn't due for drug screen per clinic policy.   17. F/U in 3 months- double appt- SCI  18. Gets Tropsium from Dr Claudene    I spent a total of  42  minutes on total care today- >50% coordination of care- due to  detailed d/w to bowel program, refills-  and went over grief issues.

## 2024-06-21 ENCOUNTER — Encounter: Admitting: Psychology

## 2024-06-21 NOTE — Progress Notes (Unsigned)
 "  Neuropsychology / Psychotherapy Note  [place]. [facility]  Physical Medicine and Rehabilitation    Patient: Brandon Watts  DOB: 06/03/1946  MRN: [id number] Provider: Evalene DOROTHA Riff, PsyD Modality & Individuals Present: The patient was seen in-person, unaccompanied, by the provider in the PM&R outpatient clinic office.  Location: [facility] Physical Medicine & Rehabilitation [address] [place] [address] Date of Service: 06/07/24 Duration of Service:  120 min - Face-to-face meeting with the patient and myself (provider) for the diagnostic clinical interview, discussion of individual therapy services offered by the provider, collaborative treatment planning/goals, treatment consent, rights and limits regarding confidentiality (details listed below).   Presenting Concerns & Record Review: (Per *** intake)   Medical Record Review: 09/08/23 CT HEAD FINDINGS (2018 MRI as comparison) Brain: A small acute subdural hematoma along the posterior aspect of the falx measures up to 3 mm in thickness. No acute intracranial hemorrhage is identified elsewhere. No acute infarct, mass, or midline shift is evident. Mild cerebral atrophy is within normal limits for age. IMPRESSION: 1. Small acute subdural hematoma along the falx.   Session Content: Goals focused on this session: Rapport, clinical interview, goals, treatment planning  Themes/Topics Discussed: Prefers no medication treatment for mental health. Hx depression, MDD recurrent, explosive anger,  Has worked with individual therapy periodically throughout his life.  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  Struggled with drug addiction Graduated highschool at 78 years old.   His injury Inpatient rehab Struggling with Hopelessness, embarassment Hard to see things moving  forward Cognitive impact on medications - ability to return to work     Interventions and Techniques Used: ***    Behavioral Observations/Assessment:  Sensory/Alertness:  Orientation: *** Language: *** Behavioral: *** Social Interaction: *** Engagement: *** Mood: *** Affect: ***  Cognition: ***  Response to Intervention/Progress & Barriers Response to Intervention: *** Goal Progress: *** Barriers to Goal Progress: ***   Suicide/Homicide/Aggression Risk: ***    Goals for Next Session: -***     Consent and Confidentiality (Reviewed ***): The nature, purpose, anticipated course, modalities, risks and benefits, limitations, alternative treatment options, of psychotherapy have been reviewed. The patient was informed of the expected benefits, potential risks, and limitations of treatment, as well as alternative treatment options. The patient was given the opportunity to ask questions. The patient understands that participation in psychotherapy is voluntary and may be discontinued at any time. Confidentiality of all information shared during psychotherapy is maintained in accordance with federal and state laws, including the [employer] and Accountability Act (HIPAA). Psychotherapy notes are not disclosed without specific patient authorization, except as required by law (e.g., risk of harm to self or others, court order, or mandatory reporting). The patient has been informed of the limits of confidentiality and the circumstances under which information may be disclosed. The patient acknowledged understanding of the above information and consents to participate in psychotherapy under these terms. Consent for Evaluation and Treatment: Signed: Yes Explanation of Privacy Policies: Signed: Yes Discussion of Confidentiality Limits: Yes  Evalene DOROTHA Riff, PsyD Clinical Neuropsychology [address] [place], [place] [address] Main: [phone] Fax:  [phone]   [address]: [phone]   This report was generated using voice recognition software. While this document has been carefully reviewed, transcription errors may be present. I apologize in advance for any inconvenience. Please contact  me if further clarification is needed.       NEUROPSYCHOLOGICAL EVALUATION [place]. [facility]  Physical Medicine and Rehabilitation     Patient: Brandon Watts  MRN: [id number] DOB: 08-29-1945  Age: 78 y.o. Sex: male  Race/Ethnicity:   Declined White or Caucasian   Handedness: ***  Provider / Neuropsychologist: Evalene DOROTHA Riff, PsyD Date of Service: *** Start: *** End: *** Location of Service:  [place]. [facility] Physical Medicine & Rehabilitation Department [address], [place], [place] [address] IIndividuals Present: Patient was seen ***, in-person, by the provider. 1 hour and 30 minutes spent in face-to-face clinical interview, collaborative treatment planning, and remaining 45 minutes was spent in record review and documentation.  Billing Code/Service: [id number] (1 Unit), [phone] (1 Unit)  IIndividuals Present: Patient was seen unaccompanied, in-person, by the provider. 1 hour and 30 minutes spent in face-to-face clinical interview, collaborative treatment planning, and remaining 45 minutes was spent in record review and documentation.    Consent and Confidentiality: The nature, purpose, anticipated course, modalities, risks and benefits, limitations, alternative treatment options, of psychotherapy have been reviewed. The patient was informed of the expected benefits, potential risks, and limitations of treatment, as well as alternative treatment options. The patient was given the opportunity to ask questions. The patient understands that participation in psychotherapy is voluntary and may be discontinued at any time. Confidentiality of all information shared during psychotherapy is maintained  in accordance with federal and state laws, including the [employer] and Accountability Act (HIPAA). Psychotherapy notes are not disclosed without specific patient authorization, except as required by law (e.g., risk of harm to self or others, court order, or mandatory reporting). The patient has been informed of the limits of confidentiality and the circumstances under which information may be disclosed. The patient acknowledged understanding of the above information and consents to participate in psychotherapy under these terms. Consent for Evaluation and Treatment: Signed: Yes Explanation of Privacy Policies: Signed: Yes Discussion of Confidentiality Limits: Yes   REASON FOR REFERRAL ***  BACKGROUND & HISTORY OF PRESENTING CONCERNS:  I would like to spend less of my time being sad and feeling hopeless... that where I'm right now is where I'm going to be for the rest of my life.   Coping with limitations   Prior Dx? Prior Tx? Depression Sx: Struggles with feelings of frequent sadness and hopelessness.  Anxiety Sx: *** Panic Sx: *** Trauma Hx/PTSD Sx: *** Mania/Hypomania: ***.  Hallucination: *** Delusion/Paranoia: *** Inpatient psychiatric admission Sleep: *** Appetite: *** Caffeine: *** Alcohol: *** Substance Use: *** Suicidal Ideation: Yes. Overdose. Safety plan. Verbal contract to reach out to support person.  Homicidal Ideation: *** Risk Factors/Safety Concerns: *** Psychosocial Stressors: *** Psychosocial Supports: *** Coping: ***  Current Cognitive Complaints *** Memory: No  Processing Speed: No   Attention & Concentration: No   Language: No    Visual-Spatial: No   Executive Functioning: No    Psychosocial: Marital Status: *** Children/Grandchildren: *** Living Situation: *** Daily Activities/Hobbies: ***  Years of Education: ***   Motor/Sensory Complaints: Hearing and vision are ***   Level of Functional Independence: The patient is  *** with basic and instrumental activities of daily living.   Medical History/Record Review: Per records ***  ; cancer, cardiovascular, cerebrovascular, seizure, TBI, chronic pain, sleep apnea,  History of traumatic brain injury/concussion: ***   History of stroke: ***   History of heart attack: ***   History of cancer/chemotherapy:***  History of seizure activity: ***     Symptoms of chronic pain: ***   Experience of frequent headaches/migraines: ***    Past Medical History:  Diagnosis Date   Allergic rhinitis    Arthritis    PAIN AND OA RIGHT HIP   Arthritis    BPH (benign prostatic hyperplasia)    DDD (degenerative disc disease), lumbar    MILD-FOUND ON XRAY   Glaucoma    normotensive type with early changes   Glaucoma    Hepatitis    HEPATITIS C -CLEARED   Hepatitis    Hep C, treated and cleared   HLD (hyperlipidemia)    taking statin as preventative   Hypertension    Insomnia    CHRONIC   Insomnia    Positive PPD, treated    Pulmonary nodules    FOLLOWED BY DR. LAMAR SAILOR. GATES WITH CT EXAMS-MOST RECENT 01/22/12   Status post trigger finger release    right    Patient Active Problem List   Diagnosis Date Noted   Neurogenic bowel 06/12/2024   Reactive depression 05/24/2024   Spasticity 02/21/2024   Neurogenic bladder - Uro - Dr Claudene at [facility] 02/21/2024   Neurogenic orthostatic hypotension (HCC) 10/22/2023   Constipation 10/22/2023   Quadriplegia, C1-C4, incomplete (HCC) 09/18/2023   C4 cervical fracture (HCC) 09/17/2023   Central cord syndrome (HCC) 09/17/2023   Dysphagia 09/17/2023   SDH (subdural hematoma) (HCC) 09/17/2023   History of pulmonary embolism - LUL 09/15/2023   Post-nasal drainage 09/15/2023   Hyperlipidemia 09/08/2023   Diastolic dysfunction without heart failure 09/17/2022   Hypercalcemia 08/25/2022   Performance anxiety 08/17/2021   Glaucoma 08/14/2021   Insomnia 08/14/2021   Hyperlipoproteinemia 08/14/2021   BPH (benign prostatic  hyperplasia) 08/14/2021   Erectile dysfunction 08/14/2021   Trigger finger, acquired 07/11/2013   S/P right THA, AA 10/25/2012   BACK PAIN, LUMBAR 01/22/2010   NONSPEC REACT TUBERCULIN SKN TEST W/O ACTV TB 06/13/2009   HEPATITIS C 02/12/2007   ALLERGIC RHINITIS 02/12/2007   History of colonic polyps 02/12/2007    Imaging/Lab Results: ***  Family Neurologic/Medical Hx: *** Family History  Problem Relation Age of Onset   Hypertension Mother    Alcohol abuse Mother    Esophageal varices Mother    Depression Mother    Colon cancer Father    Hypertension Maternal Grandmother    CVA Maternal Grandmother    Heart disease Maternal Grandfather    Lung cancer Half-Sister     Medications:  @mednamedose @  Academic/Vocational History: Highest level of educational attainment: *** History of developmental delay:*** History of grade repetition:*** Enrollment in special education courses:*** History of LD/ADHD:***  Employment:***   Mental Status/Behavioral Observations: The patient was seen on an outpatient basis in the [facility] PM&R office for the clinical interview *** Sensorium/Arousal: *** Orientation: *** Appearance: *** Behavior: *** Speech/Language: *** Motor: *** Social Comportment: *** Mood: *** Affect: *** Thought Process/Content: *** Ability to Participate in Interview: *** Insight: ***   SUMMARY / CLINICAL IMPRESSIONS  ***  DISPOSITION / PLAN  ***  Diagnosis:    Evalene DOROTHA Riff, PsyD [facility] PM&R-Clinical Neuropsychology [address] [place], [place] [address] Main: [phone] Fax: [phone] [place] License # [id number]  This report was generated using voice recognition software. While this document has been carefully reviewed, transcription errors may be present. I apologize in advance for any inconvenience. Please contact me if further clarification is needed.  "

## 2024-07-10 ENCOUNTER — Encounter: Admitting: Psychology

## 2024-07-15 ENCOUNTER — Encounter: Payer: Self-pay | Admitting: Physical Medicine and Rehabilitation

## 2024-07-19 ENCOUNTER — Encounter: Attending: Physical Medicine and Rehabilitation | Admitting: Psychology

## 2024-07-19 DIAGNOSIS — R252 Cramp and spasm: Secondary | ICD-10-CM | POA: Insufficient documentation

## 2024-07-19 DIAGNOSIS — G8252 Quadriplegia, C1-C4 incomplete: Secondary | ICD-10-CM | POA: Insufficient documentation

## 2024-07-19 DIAGNOSIS — F329 Major depressive disorder, single episode, unspecified: Secondary | ICD-10-CM | POA: Insufficient documentation

## 2024-07-19 DIAGNOSIS — G903 Multi-system degeneration of the autonomic nervous system: Secondary | ICD-10-CM | POA: Insufficient documentation

## 2024-07-19 DIAGNOSIS — K592 Neurogenic bowel, not elsewhere classified: Secondary | ICD-10-CM | POA: Insufficient documentation

## 2024-07-28 ENCOUNTER — Encounter: Payer: Self-pay | Admitting: Internal Medicine

## 2024-07-30 ENCOUNTER — Encounter: Payer: Self-pay | Admitting: Internal Medicine

## 2024-07-30 NOTE — Progress Notes (Unsigned)
 "   Subjective:    Patient ID: Brandon Watts, male    DOB: Apr 26, 1946, 79 y.o.   MRN: [id number]      HPI Brandon Watts is here for No chief complaint on file.   End of Dec - urological procedure with acetic acid to prevent continued bladder stones -- it was too strong and chemically burned the bladder.  After was having bladder pain and spasms.  Also with rectal pain and difficulty having a BM.  He is using dulcolax suppositories.  Hx of internal hemorrhoids - ? Pain from them vs other.     Referred pain - brain misinterprets bladder pain as rectal pain - feels deep, aching, pressure-like, or burning  Pelvic floor muscle spasm  Inflammation causes protective tightening of pelvic muscles - rectal pain, pain with sitting, or pain w/ BMs  Local inflammation spreading - Severe bladder inflammation can irritate nearby tissues -  feel like rectal fullness or sharp pain  Nerve sensitization - Chemical injury can make pelvic nerves hypersensitive - Pain may persist even after bladder symptoms improve   Taking baclofen , celebrex  and gabapentin    Warm sitz baths    Medications and allergies reviewed with patient and updated if appropriate.  Medications Ordered Prior to Encounter[1]  Review of Systems     Objective:  There were no vitals filed for this visit. BP Readings from Last 3 Encounters:  06/12/24 95/62  05/25/24 98/60  05/04/24 106/79   Wt Readings from Last 3 Encounters:  06/12/24 148 lb (67.1 kg)  05/25/24 142 lb (64.4 kg)  05/04/24 141 lb (64 kg)   There is no height or weight on file to calculate BMI.    Physical Exam         Assessment & Plan:    See Problem List for Assessment and Plan of chronic medical problems.         [1]  Current Outpatient Medications on File Prior to Visit  Medication Sig Dispense Refill   acetaminophen  (TYLENOL ) 325 MG tablet Take 2 tablets (650 mg total) by mouth every 6 (six) hours as needed.     Baclofen  5 MG TABS  Take 1 tablet (5 mg total) by mouth 3 (three) times daily as needed (Spascity). 60 tablet 5   bimatoprost (LUMIGAN) 0.01 % SOLN Apply to eye.     celecoxib  (CELEBREX ) 200 MG capsule Take 1 capsule (200 mg total) by mouth 2 (two) times daily as needed. 180 capsule 1   finasteride  (PROSCAR ) 5 MG tablet TAKE ONE TABLET ONCE DAILY 90 tablet 3   fluticasone  (FLONASE ) 50 MCG/ACT nasal spray Place 2 sprays into both nostrils daily.     gabapentin  (NEURONTIN ) 400 MG capsule Take 1 capsule (400 mg total) by mouth 3 (three) times daily. 90 capsule 5   melatonin 5 MG TABS Take 1 tablet (5 mg total) by mouth at bedtime. 30 tablet 0   midodrine  (PROAMATINE ) 5 MG tablet Take 1 tablet (5 mg total) by mouth 2 (two) times daily with a meal. 60 tablet 5   mirabegron ER (MYRBETRIQ) 25 MG TB24 tablet Take 25 mg by mouth.     morphine  (MS CONTIN ) 15 MG 12 hr tablet Take 1 tablet (15 mg total) by mouth every 12 (twelve) hours. For chronic neck pain 60 tablet 0   phenazopyridine  (PYRIDIUM ) 200 MG tablet Take 1 tablet (200 mg total) by mouth 3 (three) times daily as needed for pain. 90 tablet 5   polyethylene glycol (MIRALAX  /  GLYCOLAX ) 17 g packet Take 17 g by mouth daily as needed (mild to moderate constipation).     QUEtiapine  (SEROQUEL ) 25 MG tablet Take 1 tablet (25 mg total) by mouth at bedtime. 30 tablet 5   senna-docusate (SENOKOT-S) 8.6-50 MG tablet Take 2 tablets by mouth 2 (two) times daily.     tamsulosin  (FLOMAX ) 0.4 MG CAPS capsule Take 0.4 mg by mouth daily.     traMADol  (ULTRAM ) 50 MG tablet Take 2 tablets (100 mg total) by mouth 3 (three) times daily as needed for moderate pain (pain score 4-6). For chronic pain- 180 tablet 5   No current facility-administered medications on file prior to visit.   "

## 2024-07-31 ENCOUNTER — Ambulatory Visit (INDEPENDENT_AMBULATORY_CARE_PROVIDER_SITE_OTHER): Admitting: Internal Medicine

## 2024-07-31 ENCOUNTER — Encounter: Payer: Self-pay | Admitting: Internal Medicine

## 2024-07-31 VITALS — BP 122/68 | HR 64 | Temp 98.8°F | Ht 68.0 in | Wt 142.0 lb

## 2024-07-31 DIAGNOSIS — G903 Multi-system degeneration of the autonomic nervous system: Secondary | ICD-10-CM | POA: Diagnosis not present

## 2024-07-31 DIAGNOSIS — K6289 Other specified diseases of anus and rectum: Secondary | ICD-10-CM | POA: Insufficient documentation

## 2024-07-31 DIAGNOSIS — G8252 Quadriplegia, C1-C4 incomplete: Secondary | ICD-10-CM | POA: Diagnosis not present

## 2024-07-31 MED ORDER — HYDROCORTISONE ACE-PRAMOXINE 25-18 MG RE SUPP: 1.0000 | Freq: Two times a day (BID) | RECTAL | 2 refills | Status: DC

## 2024-07-31 NOTE — Assessment & Plan Note (Signed)
 Chronic BP dropping with standing - persistent On midodrine  5 mg twice daily Start wearing bilateral leg compression again He is very careful when changing positions He will continue to monitor his BP at home

## 2024-07-31 NOTE — Patient Instructions (Signed)
 SABRA

## 2024-07-31 NOTE — Assessment & Plan Note (Addendum)
 Secondary to C4 cervical fracture, central cord syndrome Symptoms have improved - it has almost been one year Following with Dr. Lovorn for management

## 2024-07-31 NOTE — Assessment & Plan Note (Signed)
 Acute Likely related to chemical cystitis - referred pain or nerve pain Also with perineum pain Bladder pain/spasms improving - I think this is the only thing that will improve rectal pain Internal hemorrhoid palpable, but I doubt this is the cause of his pain Trial of hydrocortisone -pramoxine supp - if not effective - can use the ointment he has at home - I will update rx

## 2024-08-02 MED ORDER — DIBUCAINE (PERIANAL) 1 % EX OINT: 1.0000 | TOPICAL_OINTMENT | CUTANEOUS | 5 refills | Status: AC | PRN

## 2024-08-02 MED ORDER — HYDROCORTISONE (PERIANAL) 2.5 % EX CREA: 1.0000 | TOPICAL_CREAM | Freq: Two times a day (BID) | CUTANEOUS | 5 refills | Status: AC

## 2024-09-11 ENCOUNTER — Encounter: Admitting: Physical Medicine and Rehabilitation

## 2024-09-25 ENCOUNTER — Encounter: Admitting: Physical Medicine and Rehabilitation

## 2024-09-27 ENCOUNTER — Encounter: Admitting: Psychology
